# Patient Record
Sex: Male | Born: 1955 | Race: Black or African American | Hispanic: No | Marital: Single | Smoking: Former smoker
Health system: Southern US, Community
[De-identification: ages and names within clinical notes are randomized; demographics above are authoritative.]

## PROBLEM LIST (undated history)

## (undated) DIAGNOSIS — H409 Unspecified glaucoma: Secondary | ICD-10-CM

## (undated) DIAGNOSIS — I10 Essential (primary) hypertension: Secondary | ICD-10-CM

## (undated) DIAGNOSIS — E119 Type 2 diabetes mellitus without complications: Secondary | ICD-10-CM

## (undated) DIAGNOSIS — M1990 Unspecified osteoarthritis, unspecified site: Secondary | ICD-10-CM

## (undated) HISTORY — PX: HERNIA REPAIR: SHX51

---

## 2017-06-16 DIAGNOSIS — Z8739 Personal history of other diseases of the musculoskeletal system and connective tissue: Secondary | ICD-10-CM | POA: Insufficient documentation

## 2017-06-16 DIAGNOSIS — Z79899 Other long term (current) drug therapy: Secondary | ICD-10-CM | POA: Insufficient documentation

## 2017-12-31 DIAGNOSIS — M545 Low back pain, unspecified: Secondary | ICD-10-CM | POA: Insufficient documentation

## 2017-12-31 DIAGNOSIS — G8929 Other chronic pain: Secondary | ICD-10-CM | POA: Insufficient documentation

## 2018-01-24 ENCOUNTER — Other Ambulatory Visit: Payer: Self-pay

## 2018-01-24 ENCOUNTER — Emergency Department (HOSPITAL_COMMUNITY)
Admission: EM | Admit: 2018-01-24 | Discharge: 2018-01-24 | Disposition: A | Discharge status: Home / Self Care | Payer: Medicaid Other | Attending: Emergency Medicine | Admitting: Emergency Medicine

## 2018-01-24 ENCOUNTER — Encounter (HOSPITAL_COMMUNITY): Payer: Self-pay

## 2018-01-24 DIAGNOSIS — Z87891 Personal history of nicotine dependence: Secondary | ICD-10-CM | POA: Diagnosis not present

## 2018-01-24 DIAGNOSIS — I10 Essential (primary) hypertension: Secondary | ICD-10-CM | POA: Insufficient documentation

## 2018-01-24 DIAGNOSIS — B029 Zoster without complications: Secondary | ICD-10-CM | POA: Diagnosis not present

## 2018-01-24 DIAGNOSIS — R21 Rash and other nonspecific skin eruption: Secondary | ICD-10-CM | POA: Diagnosis present

## 2018-01-24 DIAGNOSIS — E119 Type 2 diabetes mellitus without complications: Secondary | ICD-10-CM | POA: Insufficient documentation

## 2018-01-24 HISTORY — DX: Essential (primary) hypertension: I10

## 2018-01-24 HISTORY — DX: Unspecified osteoarthritis, unspecified site: M19.90

## 2018-01-24 HISTORY — DX: Unspecified glaucoma: H40.9

## 2018-01-24 HISTORY — DX: Type 2 diabetes mellitus without complications: E11.9

## 2018-01-24 LAB — CBC WITH DIFFERENTIAL/PLATELET
BASOS ABS: 0 10*3/uL (ref 0.0–0.1)
Basophils Relative: 1 %
Eosinophils Absolute: 0.2 10*3/uL (ref 0.0–0.7)
Eosinophils Relative: 5 %
HEMATOCRIT: 44.3 % (ref 39.0–52.0)
HEMOGLOBIN: 15.3 g/dL (ref 13.0–17.0)
LYMPHS PCT: 28 %
Lymphs Abs: 1.2 10*3/uL (ref 0.7–4.0)
MCH: 29.4 pg (ref 26.0–34.0)
MCHC: 34.5 g/dL (ref 30.0–36.0)
MCV: 85.2 fL (ref 78.0–100.0)
Monocytes Absolute: 0.4 10*3/uL (ref 0.1–1.0)
Monocytes Relative: 8 %
NEUTROS ABS: 2.5 10*3/uL (ref 1.7–7.7)
Neutrophils Relative %: 58 %
Platelets: 180 10*3/uL (ref 150–400)
RBC: 5.2 MIL/uL (ref 4.22–5.81)
RDW: 13 % (ref 11.5–15.5)
WBC: 4.4 10*3/uL (ref 4.0–10.5)

## 2018-01-24 LAB — BASIC METABOLIC PANEL
ANION GAP: 9 (ref 5–15)
BUN: 14 mg/dL (ref 6–20)
CO2: 27 mmol/L (ref 22–32)
Calcium: 9.4 mg/dL (ref 8.9–10.3)
Chloride: 102 mmol/L (ref 101–111)
Creatinine, Ser: 0.89 mg/dL (ref 0.61–1.24)
GFR calc Af Amer: >60 mL/min (ref >60)
GLUCOSE: 261 mg/dL — AB (ref 65–99)
POTASSIUM: 3.5 mmol/L (ref 3.5–5.1)
Sodium: 138 mmol/L (ref 135–145)

## 2018-01-24 MED ORDER — VALACYCLOVIR HCL 1 G PO TABS: 1000.0000 mg | ORAL_TABLET | Freq: Three times a day (TID) | ORAL | 0 refills | Status: AC

## 2018-01-24 NOTE — Discharge Instructions (Addendum)
Please read attached information. If you experience any new or worsening signs or symptoms please return to the emergency room for evaluation. Please follow-up with your primary care provider or specialist as discussed. Please use medication prescribed only as directed and discontinue taking if you have any concerning signs or symptoms.   °

## 2018-01-24 NOTE — ED Triage Notes (Signed)
Pt reports rash came up Friday with mild itching on back and the on Sunday came around chest. Pt reports soreness since yesterday

## 2018-01-24 NOTE — ED Provider Notes (Signed)
[facility] EMERGENCY DEPARTMENT Provider Note   CSN: [id number] Arrival date & time: 01/24/18  1141   History   Chief Complaint Chief Complaint  Patient presents with  . Rash    HPI Wayne Young is a 62 y.o. male.  HPI    50 YOM presen patient notes personally four days ago he had a tingling sensation in his back, he notes a rash popping up yesterday. He denies significant pain, denies any new exposures. Patient does report a history of rheumatoid arthritis currently on methotrexate. He denies any fever chills. Patient also has diabetes with normal blood sugar reading this morning, do not use his insulin today. Ts today with  Rash to left ribs and back. He denies any other symptoms here today.    Past Medical History:  Diagnosis Date  . Arthritis   . Diabetes mellitus without complication (HCC)   . Glaucoma   . Hypertension     There are no active problems to display for this patient.   Past Surgical History:  Procedure Laterality Date  . HERNIA REPAIR          Home Medications    Prior to Admission medications   Medication Sig Start Date End Date Taking? Authorizing Provider  valACYclovir (VALTREX) 1000 MG tablet Take 1 tablet (1,000 mg total) by mouth 3 (three) times daily for 7 days. 01/24/18 01/31/18  Eyvonne Mechanic, PA-C    Family History No family history on file.  Social History Social History   Tobacco Use  . Smoking status: Former Games developer  . Smokeless tobacco: Never Used  . Tobacco comment: quit about 20 years ago   Substance Use Topics  . Alcohol use: Never    Frequency: Never  . Drug use: Never     Allergies   Patient has no known allergies.   Review of Systems Review of Systems  All other systems reviewed and are negative.    Physical Exam Updated Vital Signs BP 138/85 (BP Location: Right Arm)   Pulse 73   Temp 98.8 F (37.1 C) (Oral)   Resp 12   SpO2 100%   Physical Exam  Constitutional: He is  oriented to person, place, and time. He appears well-developed and well-nourished.  HENT:  Head: Normocephalic and atraumatic.  Eyes: Pupils are equal, round, and reactive to light. Conjunctivae are normal. Right eye exhibits no discharge. Left eye exhibits no discharge. No scleral icterus.  Neck: Normal range of motion. No JVD present. No tracheal deviation present.  Pulmonary/Chest: Effort normal. No stridor.  Neurological: He is alert and oriented to person, place, and time. Coordination normal.  Psychiatric: He has a normal mood and affect. His behavior is normal. Judgment and thought content normal.  Nursing note and vitals reviewed.          ED Treatments / Results  Labs (all labs ordered are listed, but only abnormal results are displayed) Labs Reviewed  BASIC METABOLIC PANEL - Abnormal; Notable for the following components:      Result Value   Glucose, Bld 261 (*)    All other components within normal limits  CBC WITH DIFFERENTIAL/PLATELET    EKG None  Radiology No results found.  Procedures Procedures (including critical care time)  Medications Ordered in ED Medications - No data to display   Initial Impression / Assessment and Plan / ED Course  I have reviewed the triage vital signs and the nursing notes.  Pertinent labs & imaging results that were  available during my care of the patient were reviewed by me and considered in my medical decision making (see chart for details).      Final Clinical Impressions(s) / ED Diagnoses   Final diagnoses:  Herpes zoster without complication    Labs: BMP, CBC- glucose 261  Imaging:  Consults:  Therapeutics:  Discharge Meds: Valtrex   Assessment/Plan: Pts presentation likely shingles. Symptom onset approx 4 days ago, given patients immunocompromised status on methotrexate I will start patient on valtrex. Pt denies any pain here. Patient is encouraged follow-up as an outpatient with his primary care  provider return immediately with any new or worsening signs or symptoms. He verbalized understanding and agreement to this plan. Patient's blood sugar 261, he reports he did not take his insulin this morning, he is encouraged to continue monitoring his blood sugar levels.      ED Discharge Orders        Ordered    valACYclovir (VALTREX) 1000 MG tablet  3 times daily     01/24/18 1548       HedgesTinnie Gens, PA-C 01/24/18 1739    Tilden Fossa, MD 01/24/18 2303

## 2018-01-24 NOTE — ED Provider Notes (Signed)
Patient placed in Quick Look pathway, seen and evaluated   Chief Complaint: Rash  HPI:   62 y.o. M with PMH/o RA, DM who presentes for evaluation of rash that began 4 days ago. Reports it is sore but not pruritic. No new exposures, detergents, lotions, soaps.   ROS: Rash (one)  Physical Exam:   Gen: No distress  Neuro: Awake and Alert  Skin: Warm    Focused Exam: Scattered, groups vesicular rash noted to the back, right axilla, and the anterior right chest.             Initiation of care has begun. The patient has been counseled on the process, plan, and necessity for staying for the completion/evaluation, and the remainder of the medical screening examination    Maxwell Caul, PA-C 01/24/18 1350    Melene Plan, DO 01/24/18 1521

## 2018-04-29 ENCOUNTER — Encounter (HOSPITAL_COMMUNITY): Payer: Self-pay | Admitting: *Deleted

## 2018-04-29 ENCOUNTER — Emergency Department (HOSPITAL_COMMUNITY)
Admission: EM | Admit: 2018-04-29 | Discharge: 2018-04-29 | Disposition: A | Discharge status: Home / Self Care | Payer: Medicaid Other | Attending: Emergency Medicine | Admitting: Emergency Medicine

## 2018-04-29 ENCOUNTER — Other Ambulatory Visit: Payer: Self-pay

## 2018-04-29 DIAGNOSIS — Z87891 Personal history of nicotine dependence: Secondary | ICD-10-CM | POA: Diagnosis not present

## 2018-04-29 DIAGNOSIS — E876 Hypokalemia: Secondary | ICD-10-CM

## 2018-04-29 DIAGNOSIS — I10 Essential (primary) hypertension: Secondary | ICD-10-CM | POA: Diagnosis not present

## 2018-04-29 DIAGNOSIS — E162 Hypoglycemia, unspecified: Secondary | ICD-10-CM

## 2018-04-29 DIAGNOSIS — E119 Type 2 diabetes mellitus without complications: Secondary | ICD-10-CM | POA: Diagnosis not present

## 2018-04-29 LAB — CBC WITH DIFFERENTIAL/PLATELET
ABS IMMATURE GRANULOCYTES: 0 10*3/uL (ref 0.0–0.1)
BASOS ABS: 0 10*3/uL (ref 0.0–0.1)
Basophils Relative: 1 %
Eosinophils Absolute: 0.4 10*3/uL (ref 0.0–0.7)
Eosinophils Relative: 5 %
HCT: 53.8 % — ABNORMAL HIGH (ref 39.0–52.0)
HEMOGLOBIN: 17.9 g/dL — AB (ref 13.0–17.0)
IMMATURE GRANULOCYTES: 0 %
LYMPHS PCT: 49 %
Lymphs Abs: 3.7 10*3/uL (ref 0.7–4.0)
MCH: 29 pg (ref 26.0–34.0)
MCHC: 33.3 g/dL (ref 30.0–36.0)
MCV: 87.1 fL (ref 78.0–100.0)
MONO ABS: 0.5 10*3/uL (ref 0.1–1.0)
Monocytes Relative: 7 %
NEUTROS ABS: 2.8 10*3/uL (ref 1.7–7.7)
NEUTROS PCT: 38 %
Platelets: 228 10*3/uL (ref 150–400)
RBC: 6.18 MIL/uL — AB (ref 4.22–5.81)
RDW: 13.2 % (ref 11.5–15.5)
WBC: 7.4 10*3/uL (ref 4.0–10.5)

## 2018-04-29 LAB — COMPREHENSIVE METABOLIC PANEL
ALBUMIN: 4.1 g/dL (ref 3.5–5.0)
ALT: 21 U/L (ref 0–44)
AST: 26 U/L (ref 15–41)
Alkaline Phosphatase: 63 U/L (ref 38–126)
Anion gap: 14 (ref 5–15)
BUN: 13 mg/dL (ref 8–23)
CHLORIDE: 105 mmol/L (ref 98–111)
CO2: 27 mmol/L (ref 22–32)
CREATININE: 0.93 mg/dL (ref 0.61–1.24)
Calcium: 9.8 mg/dL (ref 8.9–10.3)
GFR calc non Af Amer: >60 mL/min (ref >60)
Glucose, Bld: 52 mg/dL — ABNORMAL LOW (ref 70–99)
Potassium: 2.7 mmol/L — CL (ref 3.5–5.1)
SODIUM: 146 mmol/L — AB (ref 135–145)
Total Bilirubin: 0.8 mg/dL (ref 0.3–1.2)
Total Protein: 7.4 g/dL (ref 6.5–8.1)

## 2018-04-29 LAB — CBG MONITORING, ED
GLUCOSE-CAPILLARY: 78 mg/dL (ref 70–99)
GLUCOSE-CAPILLARY: 140 mg/dL — AB (ref 70–99)
Glucose-Capillary: 43 mg/dL — CL (ref 70–99)
Glucose-Capillary: 58 mg/dL — ABNORMAL LOW (ref 70–99)

## 2018-04-29 MED ORDER — POTASSIUM CHLORIDE CRYS ER 20 MEQ PO TBCR
40.0000 meq | EXTENDED_RELEASE_TABLET | Freq: Once | ORAL | Status: AC
  Administered 2018-04-29: 40 meq via ORAL
  Filled 2018-04-29: qty 2

## 2018-04-29 NOTE — Discharge Instructions (Addendum)
Please return for any problem.  Follow-up with your regular doctor as instructed for recheck of your potassium.  Always eat after taking insulin.

## 2018-04-29 NOTE — ED Triage Notes (Signed)
Pt in stating he gave himself his insulin this morning and developed hives at the injection site, on the way here he did not eat and developed hypoglycemia, diaphoretic on arrrival with CBG 43, given food and juice in triage

## 2018-04-29 NOTE — ED Notes (Signed)
Pt has no complaints at this time  

## 2018-04-29 NOTE — ED Provider Notes (Signed)
[facility] EMERGENCY DEPARTMENT Provider Note   CSN: [id number] Arrival date & time: 04/29/18  1004     History   Chief Complaint Chief Complaint  Patient presents with  . Hypoglycemia    HPI Wayne Young is a 62 y.o. male.  62 year old male with prior history of insulin-dependent diabetes presents for hypoglycemia.  Patient reports that he gave himself insulin this morning.  He subsequently did not eat breakfast.  Shortly thereafter he developed symptoms of hypoglycemia and came straight to the ED.  Upon arrival to the ED he was given food to eat and he feels improved.  He denies other complaint.  The history is provided by the patient and medical records.  Illness  This is a recurrent problem. The current episode started 1 to 2 hours ago. The problem occurs rarely. The problem has been gradually improving. Pertinent negatives include no chest pain, no abdominal pain, no headaches and no shortness of breath. Nothing aggravates the symptoms. Nothing relieves the symptoms. He has tried nothing for the symptoms.    Past Medical History:  Diagnosis Date  . Arthritis   . Diabetes mellitus without complication (HCC)   . Glaucoma   . Hypertension     There are no active problems to display for this patient.   Past Surgical History:  Procedure Laterality Date  . HERNIA REPAIR          Home Medications    Prior to Admission medications   Not on File    Family History History reviewed. No pertinent family history.  Social History Social History   Tobacco Use  . Smoking status: Former Games developer  . Smokeless tobacco: Never Used  . Tobacco comment: quit about 20 years ago   Substance Use Topics  . Alcohol use: Never    Frequency: Never  . Drug use: Never     Allergies   Patient has no known allergies.   Review of Systems Review of Systems  Respiratory: Negative for shortness of breath.   Cardiovascular: Negative for chest pain.    Gastrointestinal: Negative for abdominal pain.  Neurological: Negative for headaches.  All other systems reviewed and are negative.    Physical Exam Updated Vital Signs BP 117/81 (BP Location: Right Arm)   Pulse (!) 101   Temp (!) 97.5 F (36.4 C) (Oral)   Resp 17   SpO2 100%   Physical Exam  Constitutional: He is oriented to person, place, and time. He appears well-developed and well-nourished. No distress.  HENT:  Head: Normocephalic and atraumatic.  Mouth/Throat: Oropharynx is clear and moist.  Eyes: Pupils are equal, round, and reactive to light. Conjunctivae and EOM are normal.  Neck: Normal range of motion. Neck supple.  Cardiovascular: Normal rate, regular rhythm and normal heart sounds.  Pulmonary/Chest: Effort normal and breath sounds normal. No respiratory distress.  Abdominal: Soft. He exhibits no distension. There is no tenderness.  Musculoskeletal: Normal range of motion. He exhibits no edema or deformity.  Neurological: He is alert and oriented to person, place, and time.  Skin: Skin is warm and dry.  Psychiatric: He has a normal mood and affect.  Nursing note and vitals reviewed.    ED Treatments / Results  Labs (all labs ordered are listed, but only abnormal results are displayed) Labs Reviewed  CBC WITH DIFFERENTIAL/PLATELET - Abnormal; Notable for the following components:      Result Value   RBC 6.18 (*)    Hemoglobin 17.9 (*)  HCT 53.8 (*)    All other components within normal limits  COMPREHENSIVE METABOLIC PANEL - Abnormal; Notable for the following components:   Sodium 146 (*)    Potassium 2.7 (*)    Glucose, Bld 52 (*)    All other components within normal limits  CBG MONITORING, ED - Abnormal; Notable for the following components:   Glucose-Capillary 43 (*)    All other components within normal limits  CBG MONITORING, ED - Abnormal; Notable for the following components:   Glucose-Capillary 58 (*)    All other components within normal  limits  CBG MONITORING, ED - Abnormal; Notable for the following components:   Glucose-Capillary 140 (*)    All other components within normal limits  CBG MONITORING, ED    EKG None  Radiology No results found.  Procedures Procedures (including critical care time)  Medications Ordered in ED Medications  potassium chloride SA (K-DUR,KLOR-CON) CR tablet 40 mEq (has no administration in time range)     Initial Impression / Assessment and Plan / ED Course  I have reviewed the triage vital signs and the nursing notes.  Pertinent labs & imaging results that were available during my care of the patient were reviewed by me and considered in my medical decision making (see chart for details).     MDM  Screen complete  Patient is presenting for episode of hypoglycemia.  Patient appears to have given himself insulin and then not eating breakfast quickly.  Symptoms improved with p.o. intake.  After a brief period of observation in the ED he is without symptoms.  Repeat Accu-Chek shows improvement in his blood glucose.  Screening labs did reveal hypokalemia which was treated.  Patient understands the need for close follow-up with his regular doctor - especially for recheck of his potassium. Return precautions are given and understood.  Final Clinical Impressions(s) / ED Diagnoses   Final diagnoses:  Hypoglycemia  Hypokalemia    ED Discharge Orders    None       Wynetta Fines, MD 04/29/18 1217

## 2018-04-29 NOTE — ED Notes (Signed)
Pt given additional juice and snacks, remains alert and oriented able to tolerate POs

## 2018-06-28 ENCOUNTER — Emergency Department (HOSPITAL_COMMUNITY)
Admission: EM | Admit: 2018-06-28 | Discharge: 2018-06-28 | Disposition: A | Discharge status: Home / Self Care | Payer: Medicaid Other | Attending: Emergency Medicine | Admitting: Emergency Medicine

## 2018-06-28 ENCOUNTER — Other Ambulatory Visit: Payer: Self-pay

## 2018-06-28 ENCOUNTER — Encounter (HOSPITAL_COMMUNITY): Payer: Self-pay | Admitting: *Deleted

## 2018-06-28 ENCOUNTER — Emergency Department (HOSPITAL_COMMUNITY): Payer: Medicaid Other

## 2018-06-28 DIAGNOSIS — I10 Essential (primary) hypertension: Secondary | ICD-10-CM | POA: Insufficient documentation

## 2018-06-28 DIAGNOSIS — Z87891 Personal history of nicotine dependence: Secondary | ICD-10-CM | POA: Diagnosis not present

## 2018-06-28 DIAGNOSIS — E119 Type 2 diabetes mellitus without complications: Secondary | ICD-10-CM | POA: Diagnosis not present

## 2018-06-28 DIAGNOSIS — R0600 Dyspnea, unspecified: Secondary | ICD-10-CM | POA: Diagnosis present

## 2018-06-28 LAB — CBC
HEMATOCRIT: 50.7 % (ref 39.0–52.0)
HEMOGLOBIN: 17.1 g/dL — AB (ref 13.0–17.0)
MCH: 29.6 pg (ref 26.0–34.0)
MCHC: 33.7 g/dL (ref 30.0–36.0)
MCV: 87.9 fL (ref 78.0–100.0)
Platelets: 223 10*3/uL (ref 150–400)
RBC: 5.77 MIL/uL (ref 4.22–5.81)
RDW: 13.1 % (ref 11.5–15.5)
WBC: 4.7 10*3/uL (ref 4.0–10.5)

## 2018-06-28 LAB — I-STAT TROPONIN, ED: TROPONIN I, POC: 0.02 ng/mL (ref 0.00–0.08)

## 2018-06-28 LAB — BASIC METABOLIC PANEL
ANION GAP: 13 (ref 5–15)
BUN: 10 mg/dL (ref 8–23)
CALCIUM: 9.4 mg/dL (ref 8.9–10.3)
CHLORIDE: 97 mmol/L — AB (ref 98–111)
CO2: 27 mmol/L (ref 22–32)
Creatinine, Ser: 0.88 mg/dL (ref 0.61–1.24)
GFR calc non Af Amer: >60 mL/min (ref >60)
GLUCOSE: 283 mg/dL — AB (ref 70–99)
POTASSIUM: 3.1 mmol/L — AB (ref 3.5–5.1)
Sodium: 137 mmol/L (ref 135–145)

## 2018-06-28 MED ORDER — IPRATROPIUM BROMIDE 0.02 % IN SOLN
0.5000 mg | Freq: Once | RESPIRATORY_TRACT | Status: AC
  Administered 2018-06-28: 0.5 mg via RESPIRATORY_TRACT
  Filled 2018-06-28: qty 2.5

## 2018-06-28 MED ORDER — ALBUTEROL SULFATE HFA 108 (90 BASE) MCG/ACT IN AERS
2.0000 | INHALATION_SPRAY | RESPIRATORY_TRACT | Status: DC
  Administered 2018-06-28: 2 via RESPIRATORY_TRACT
  Filled 2018-06-28: qty 6.7

## 2018-06-28 MED ORDER — POTASSIUM CHLORIDE CRYS ER 20 MEQ PO TBCR
40.0000 meq | EXTENDED_RELEASE_TABLET | Freq: Once | ORAL | Status: AC
  Administered 2018-06-28: 40 meq via ORAL
  Filled 2018-06-28: qty 2

## 2018-06-28 MED ORDER — ALBUTEROL SULFATE (2.5 MG/3ML) 0.083% IN NEBU
5.0000 mg | INHALATION_SOLUTION | Freq: Once | RESPIRATORY_TRACT | Status: AC
  Administered 2018-06-28: 5 mg via RESPIRATORY_TRACT
  Filled 2018-06-28: qty 6

## 2018-06-28 NOTE — ED Provider Notes (Signed)
Patient placed in Quick Look pathway, seen and evaluated   Chief Complaint: Shortness of breath  HPI:   Patient with 20-month history of progressively worsening dyspnea on exertion.  Denies chest pain, cough, fevers.  Former smoker.  Denies orthopnea, leg swelling, or PND.  No recent travel or surgeries, no hemoptysis, no prior history of DVT or PE.  Does not take testosterone hormone placement therapy.  ROS: Positive for shortness of breath Negative for leg swelling, fevers, chest pain, cough, orthopnea, PND.  Physical Exam:   Gen: No distress  Neuro: Awake and Alert  Skin: Warm    Focused Exam: Heart regular rhythm, no murmurs rubs or gallops.  Tachycardic.  Globally diminished breath sounds.  Equal rise and fall of chest, no increased work of breathing.  Speaking in full sentences.  No lower cavity edema, Homans sign absent bilaterally.  2+ DP/PT pulses bilaterally.   Initiation of care has begun. The patient has been counseled on the process, plan, and necessity for staying for the completion/evaluation, and the remainder of the medical screening examination    Jeanie Sewer, PA-C 06/28/18 1600    Lorre Nick, MD 06/29/18 [phone]

## 2018-06-28 NOTE — ED Provider Notes (Signed)
[facility] EMERGENCY DEPARTMENT Provider Note   CSN: [id number] Arrival date & time: 06/28/18  1531     History   Chief Complaint Chief Complaint  Patient presents with  . Shortness of Breath    HPI Anakin Varkey is a 62 y.o. male.  62 year old male presents with several month history of dyspnea on exertion.  He denies any orthopnea.  Denies any chest pain, cough, fever or chills.  No history of blood loss.  Has been referred for further specialized testing but is not had that yet.  Does have a history of tobacco abuse.  Denies any lower extremity edema.  Symptoms are better with rest.     Past Medical History:  Diagnosis Date  . Arthritis   . Diabetes mellitus without complication (HCC)   . Glaucoma   . Hypertension     There are no active problems to display for this patient.   Past Surgical History:  Procedure Laterality Date  . HERNIA REPAIR          Home Medications    Prior to Admission medications   Not on File    Family History History reviewed. No pertinent family history.  Social History Social History   Tobacco Use  . Smoking status: Former Games developer  . Smokeless tobacco: Never Used  . Tobacco comment: quit about 20 years ago   Substance Use Topics  . Alcohol use: Never    Frequency: Never  . Drug use: Never     Allergies   Patient has no known allergies.   Review of Systems Review of Systems  All other systems reviewed and are negative.    Physical Exam Updated Vital Signs BP (!) 140/99   Pulse 97   Temp 98.4 F (36.9 C) (Oral)   Resp 18   SpO2 99%   Physical Exam  Constitutional: He is oriented to person, place, and time. He appears well-developed and well-nourished.  Non-toxic appearance. No distress.  HENT:  Head: Normocephalic and atraumatic.  Eyes: Pupils are equal, round, and reactive to light. Conjunctivae, EOM and lids are normal.  Neck: Normal range of motion. Neck supple. No tracheal  deviation present. No thyroid mass present.  Cardiovascular: Normal rate, regular rhythm and normal heart sounds. Exam reveals no gallop.  No murmur heard. Pulmonary/Chest: Effort normal. No stridor. No respiratory distress. He has decreased breath sounds in the right lower field and the left lower field. He has no wheezes. He has no rhonchi. He has no rales.  Abdominal: Soft. Normal appearance and bowel sounds are normal. He exhibits no distension. There is no tenderness. There is no rebound and no CVA tenderness.  Musculoskeletal: Normal range of motion. He exhibits no edema or tenderness.  Neurological: He is alert and oriented to person, place, and time. He has normal strength. No cranial nerve deficit or sensory deficit. GCS eye subscore is 4. GCS verbal subscore is 5. GCS motor subscore is 6.  Skin: Skin is warm and dry. No abrasion and no rash noted.  Psychiatric: He has a normal mood and affect. His speech is normal and behavior is normal.  Nursing note and vitals reviewed.    ED Treatments / Results  Labs (all labs ordered are listed, but only abnormal results are displayed) Labs Reviewed  BASIC METABOLIC PANEL - Abnormal; Notable for the following components:      Result Value   Potassium 3.1 (*)    Chloride 97 (*)    Glucose, Bld  283 (*)    All other components within normal limits  CBC - Abnormal; Notable for the following components:   Hemoglobin 17.1 (*)    All other components within normal limits  I-STAT TROPONIN, ED    EKG EKG Interpretation  Date/Time:  Tuesday June 28 2018 15:40:07 EDT Ventricular Rate:  113 PR Interval:  172 QRS Duration: 86 QT Interval:  332 QTC Calculation: 455 R Axis:   42 Text Interpretation:  Sinus tachycardia Biatrial enlargement Anterior infarct , age undetermined Abnormal ECG Confirmed by Lorre Nick ([id number]) on 06/28/2018 9:02:08 PM   Radiology Dg Chest 2 View  Result Date: 06/28/2018 CLINICAL DATA:  Shortness of breath  and decreased energy for the past month. 15-20 pound weight loss over the past couple of months. Former smoker. History of diabetes and hypertension. EXAM: CHEST - 2 VIEW COMPARISON:  None in PACs FINDINGS: The lungs are mildly hyperinflated. There is no focal infiltrate. The interstitial markings are mildly prominent on the lateral view but appear normal on the frontal view. The heart and pulmonary vascularity are normal. The mediastinum is normal in width. The bony thorax exhibits no acute abnormality. IMPRESSION: COPD-smoking related changes. No evidence of a pulmonary mass, active infection, nor other acute cardiopulmonary abnormality. Electronically Signed   By: David  [place] M.D.   On: 06/28/2018 16:18    Procedures Procedures (including critical care time)  Medications Ordered in ED Medications  potassium chloride SA (K-DUR,KLOR-CON) CR tablet 40 mEq (has no administration in time range)  ipratropium (ATROVENT) nebulizer solution 0.5 mg (has no administration in time range)  albuterol (PROVENTIL) (2.5 MG/3ML) 0.083% nebulizer solution 5 mg (has no administration in time range)     Initial Impression / Assessment and Plan / ED Course  I have reviewed the triage vital signs and the nursing notes.  Pertinent labs & imaging results that were available during my care of the patient were reviewed by me and considered in my medical decision making (see chart for details).    pts potassium replinished patient has chronic hyperkalemia and did not take his oral potassium today. Given albuterol here and feels better.  Suspect that his dyspnea which is been for over 9 months is likely from COPD.  Will give inhaler and he will follow-up with his doctor on Thursday  Final Clinical Impressions(s) / ED Diagnoses   Final diagnoses:  None    ED Discharge Orders    None       Lorre Nick, MD 06/28/18 2221

## 2018-06-28 NOTE — ED Triage Notes (Signed)
Pt reports having ongoing sob. Has been referred to see a specialist but unable to get appt yet. spo2 98% at triage. Denies recent cough.

## 2018-07-04 ENCOUNTER — Other Ambulatory Visit: Payer: Self-pay | Admitting: Gastroenterology

## 2018-07-04 DIAGNOSIS — R634 Abnormal weight loss: Secondary | ICD-10-CM

## 2018-07-04 DIAGNOSIS — R0602 Shortness of breath: Secondary | ICD-10-CM

## 2018-07-08 ENCOUNTER — Ambulatory Visit
Admission: RE | Admit: 2018-07-08 | Discharge: 2018-07-08 | Disposition: A | Discharge status: Home / Self Care | Payer: Medicaid Other | Source: Ambulatory Visit | Attending: Gastroenterology | Admitting: Gastroenterology

## 2018-07-08 ENCOUNTER — Other Ambulatory Visit: Payer: Medicaid Other

## 2018-07-08 DIAGNOSIS — R634 Abnormal weight loss: Secondary | ICD-10-CM

## 2018-07-08 DIAGNOSIS — R0602 Shortness of breath: Secondary | ICD-10-CM

## 2018-07-08 MED ORDER — IOPAMIDOL (ISOVUE-300) INJECTION 61%
75.0000 mL | Freq: Once | INTRAVENOUS | Status: AC | PRN
  Administered 2018-07-08: 75 mL via INTRAVENOUS

## 2018-07-13 ENCOUNTER — Other Ambulatory Visit: Payer: Self-pay | Admitting: Gastroenterology

## 2018-07-13 ENCOUNTER — Other Ambulatory Visit: Payer: Medicaid Other

## 2018-07-13 ENCOUNTER — Encounter: Payer: Self-pay | Admitting: Pulmonary Disease

## 2018-07-13 ENCOUNTER — Ambulatory Visit (INDEPENDENT_AMBULATORY_CARE_PROVIDER_SITE_OTHER): Payer: Medicaid Other | Admitting: Pulmonary Disease

## 2018-07-13 VITALS — BP 128/80 | HR 95 | Ht 67.0 in | Wt 137.2 lb

## 2018-07-13 DIAGNOSIS — R911 Solitary pulmonary nodule: Secondary | ICD-10-CM | POA: Diagnosis not present

## 2018-07-13 DIAGNOSIS — R634 Abnormal weight loss: Secondary | ICD-10-CM | POA: Diagnosis not present

## 2018-07-13 DIAGNOSIS — M059 Rheumatoid arthritis with rheumatoid factor, unspecified: Secondary | ICD-10-CM | POA: Diagnosis not present

## 2018-07-13 DIAGNOSIS — R7612 Nonspecific reaction to cell mediated immunity measurement of gamma interferon antigen response without active tuberculosis: Secondary | ICD-10-CM

## 2018-07-13 DIAGNOSIS — Z9189 Other specified personal risk factors, not elsewhere classified: Secondary | ICD-10-CM

## 2018-07-13 DIAGNOSIS — R768 Other specified abnormal immunological findings in serum: Secondary | ICD-10-CM | POA: Insufficient documentation

## 2018-07-13 DIAGNOSIS — R0602 Shortness of breath: Secondary | ICD-10-CM | POA: Diagnosis not present

## 2018-07-13 NOTE — Progress Notes (Signed)
Synopsis: Referred in September 2019 for shortness of breath and weight loss by Valerie Roys,*  Subjective:   PATIENT ID: Wayne Young GENDER: male DOB: 12-Dec-1955, MRN: [id number]  Chief Complaint  Patient presents with  . Consult    SOB x 3 months with minimal effort, denies chest pain or discomfort, has lost 30lbs over last 3 months.     Patient has had SOB for the past 3-4 months. Its been worse over the past several weeks. He was diagnosed in early 2000s with DMII and is now on insulin. He was diagnosed with RA in 2008, he sees a rheuatologist at [facility], currently managed with methotrexate plus folic acid. He has lost 30lbs over the past several months.  Weight loss is seemingly in the most distressing symptom.  He also complains of poor appetite and the feeling of making himself eat.  C-scope in 2015 that was negative. He has a CT abd pending for his evaluation of weight loss. He has never had an EGD.   Former smoker, quit 21 years ago, smoked for many years, ~25 pack year history.  He used to drink alcohol but he quit the same time he quit smoking.  He denies illicit drug use. Most recent CT of the chest reveals evidence of centrilobular emphysema.  However upon review this does appear mild.  He denies any reflux symptoms, dysphagia, odynophagia, black stools, abdominal pain.  When asked about hepatitis the C, TB and HIV risk factors.  Patient was incarcerated for 17 years and was released from jail in January 2018.  His wife has HIV and is currently on therapy.  He was screened for HIV upon release from prison which she states was negative.  He does not ever remember being screened for syphilis.  He states all sexual intercourse with his wife includes condom protection.  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  He was given 6 months of medication to take for the positive PPD.   Past Medical History:  Diagnosis Date  . Arthritis   . Diabetes  mellitus without complication (HCC)   . Glaucoma   . Hypertension      Family History  Problem Relation Age of Onset  . Asthma Mother      Past Surgical History:  Procedure Laterality Date  . HERNIA REPAIR      Social History   Socioeconomic History  . Marital status: Single    Spouse name: Not on file  . Number of children: Not on file  . Years of education: Not on file  . Highest education level: Not on file  Occupational History  . Not on file  Social Needs  . Financial resource strain: Not on file  . Food insecurity:    Worry: Not on file    Inability: Not on file  . Transportation needs:    Medical: Not on file    Non-medical: Not on file  Tobacco Use  . Smoking status: Former Games developer  . Smokeless tobacco: Never Used  . Tobacco comment: quit about 20 years ago   Substance and Sexual Activity  . Alcohol use: Never    Frequency: Never  . Drug use: Never  . Sexual activity: Never  Lifestyle  . Physical activity:    Days per week: Not on file    Minutes per session: Not on file  . Stress: Not on file  Relationships  . Social connections:    Talks on phone: Not  on file    Gets together: Not on file    Attends religious service: Not on file    Active member of club or organization: Not on file    Attends meetings of clubs or organizations: Not on file    Relationship status: Not on file  . Intimate partner violence:    Fear of current or ex partner: Not on file    Emotionally abused: Not on file    Physically abused: Not on file    Forced sexual activity: Not on file  Other Topics Concern  . Not on file  Social History Narrative  . Not on file     No Known Allergies   Outpatient Medications Prior to Visit  Medication Sig Dispense Refill  . amLODipine (NORVASC) 10 MG tablet Take by mouth.    Marland Kitchen atorvastatin (LIPITOR) 20 MG tablet Take 20 mg by mouth at bedtime.  3  . folic acid (FOLVITE) 1 MG tablet Take by mouth.    . hydrochlorothiazide  (HYDRODIURIL) 25 MG tablet Take by mouth.    . insulin NPH Human (HUMULIN N,NOVOLIN N) 100 UNIT/ML injection Inject into the skin.    . metFORMIN (GLUCOPHAGE) 850 MG tablet Take by mouth.    . methotrexate (RHEUMATREX) 2.5 MG tablet Take by mouth.    . Multiple Vitamin (MULTIVITAMIN) tablet Take 1 tablet by mouth daily.    . pantoprazole (PROTONIX) 40 MG tablet TAKE 1 TABLET BY MOUTH IN THE MORNING FOR 30 DAYS  2   No facility-administered medications prior to visit.     Review of Systems  Constitutional: Positive for weight loss (30lbs unintentional ). Negative for chills, fever and malaise/fatigue.  HENT: Negative for hearing loss, sore throat and tinnitus.        Hoarseness of voice   Eyes: Negative for blurred vision and double vision.  Respiratory: Positive for shortness of breath. Negative for cough, hemoptysis, sputum production, wheezing and stridor.        DOE   Cardiovascular: Negative for chest pain, palpitations, orthopnea, leg swelling and PND.  Gastrointestinal: Negative for abdominal pain, constipation, diarrhea, heartburn, nausea and vomiting.  Genitourinary: Negative for dysuria, hematuria and urgency.  Musculoskeletal: Negative for joint pain and myalgias.  Skin: Positive for rash. Negative for itching.  Neurological: Negative for dizziness, tingling, weakness and headaches.  Endo/Heme/Allergies: Negative for environmental allergies. Does not bruise/bleed easily.  Psychiatric/Behavioral: Negative for depression. The patient is not nervous/anxious and does not have insomnia.   All other systems reviewed and are negative.    Objective:  Physical Exam  Constitutional: He is oriented to person, place, and time. He appears well-developed and well-nourished. No distress.  HENT:  Head: Normocephalic and atraumatic.  Mouth/Throat: Oropharynx is clear and moist.  Eyes: Pupils are equal, round, and reactive to light. Conjunctivae are normal. No scleral icterus.  Neck:  Neck supple. No JVD present. No tracheal deviation present.  Cardiovascular: Normal rate, regular rhythm, normal heart sounds and intact distal pulses.  No murmur heard. Pulmonary/Chest: Effort normal and breath sounds normal. No accessory muscle usage or stridor. No tachypnea. No respiratory distress. He has no wheezes. He has no rhonchi. He has no rales.  Abdominal: Soft. Bowel sounds are normal. He exhibits no distension. There is no tenderness.  Flat, thin   Musculoskeletal: He exhibits no edema or tenderness.  Thin, muscle loss  Lymphadenopathy:    He has no cervical adenopathy.  Neurological: He is alert and oriented to person, place, and  time.  Skin: Skin is warm and dry. Capillary refill takes less than 2 seconds. Rash noted.  Dark pigmented patches on posterior back  Psychiatric: He has a normal mood and affect. His behavior is normal.  Vitals reviewed.    Vitals:   07/13/18 1555  BP: 128/80  Pulse: 95  SpO2: 94%  Weight: 137 lb 3.2 oz (62.2 kg)  Height: 5\' 7"  (1.702 m)   94% on RA BMI Readings from Last 3 Encounters:  07/13/18 21.49 kg/m   Wt Readings from Last 3 Encounters:  07/13/18 137 lb 3.2 oz (62.2 kg)     CBC    Component Value Date/Time   WBC 4.7 06/28/2018 1603   RBC 5.77 06/28/2018 1603   HGB 17.1 (H) 06/28/2018 1603   [id number] Marland KitchenO QuantiFERON-TB Gold test  Positive anti-CCP test  At risk for sexually transmitted disease due to partner with HIV  Discussion:  Patient has multiple potential etiologies of his shortness of breath.  He does have a long-standing history of smoking however quit several years ago.  CT scan with evidence of mild to moderate centrilobular emphysema.  His weight loss is most concerning to me.  He does have risk factors for HIV, hep C, hep B, syphilis.  He has no respiratory complaints besides shortness of breath, no sputum production, no hemoptysis however did have a prior positive PPD. History of incarceration. 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  Patient is unsure of the drug regimen.  Concern for underlying malignancy with history of RA on immune suppression as well as potential for chronic infection or also concerning.  Additionally, the DOE/SOB could be related to small airway disease or ILD caused by RA. However there was no significant evidence on CT imaging of this.   Plan: Will send you for lab work today: To include screening for HIV, hep C, hep B, syphilis.  As well as repeat QuantiFERON gold. We will need to have his rheumatologist address ongoing immunosuppression with positive quantiferon / hx of latent TB. We need to make sure you have Abd CT which has been ordered.  We will get PFTs and an ECHO.  He will need a 9-12 follow up CT chest wo contrast for follow up regarding the lung nodules RTC in 3 weeks, once imaging and labs have been completed.    Current Outpatient Medications:  .  amLODipine (NORVASC) 10 MG tablet,  Take by mouth., Disp: , Rfl:  .  atorvastatin (LIPITOR) 20 MG tablet, Take 20  mg by mouth at bedtime., Disp: , Rfl: 3 .  folic acid (FOLVITE) 1 MG tablet, Take by mouth., Disp: , Rfl:  .  hydrochlorothiazide (HYDRODIURIL) 25 MG tablet, Take by mouth., Disp: , Rfl:  .  insulin NPH Human (HUMULIN N,NOVOLIN N) 100 UNIT/ML injection, Inject into the skin., Disp: , Rfl:  .  metFORMIN (GLUCOPHAGE) 850 MG tablet, Take by mouth., Disp: , Rfl:  .  methotrexate (RHEUMATREX) 2.5 MG tablet, Take by mouth., Disp: , Rfl:  .  Multiple Vitamin (MULTIVITAMIN) tablet, Take 1 tablet by mouth daily., Disp: , Rfl:  .  pantoprazole (PROTONIX) 40 MG tablet, TAKE 1 TABLET BY MOUTH IN THE MORNING FOR 30 DAYS, Disp: , Rfl: 2   Josephine Igo, DO [facility] 07/13/2018 6:39 PM

## 2018-07-13 NOTE — Patient Instructions (Signed)
Will send you for lab work today. We need to make sure you have Abd CT which has been ordered We will get PFTs and an ECHO.  RTC in 3 weeks

## 2018-07-14 ENCOUNTER — Ambulatory Visit (HOSPITAL_COMMUNITY): Payer: Medicaid Other | Attending: Cardiovascular Disease

## 2018-07-14 ENCOUNTER — Other Ambulatory Visit: Payer: Self-pay

## 2018-07-14 DIAGNOSIS — I509 Heart failure, unspecified: Secondary | ICD-10-CM | POA: Diagnosis not present

## 2018-07-14 DIAGNOSIS — E119 Type 2 diabetes mellitus without complications: Secondary | ICD-10-CM | POA: Diagnosis not present

## 2018-07-14 DIAGNOSIS — I110 Hypertensive heart disease with heart failure: Secondary | ICD-10-CM | POA: Insufficient documentation

## 2018-07-14 DIAGNOSIS — R0602 Shortness of breath: Secondary | ICD-10-CM | POA: Diagnosis present

## 2018-07-16 LAB — HEPATITIS C ANTIBODY
Hepatitis C Ab: NONREACTIVE
SIGNAL TO CUT-OFF: 0.03 (ref <1.00)

## 2018-07-16 LAB — QUANTIFERON-TB GOLD PLUS
Mitogen-NIL: >10 [IU]/mL
NIL: 0.06 [IU]/mL
QuantiFERON-TB Gold Plus: NEGATIVE
TB1-NIL: 0.06 [IU]/mL
TB2-NIL: 0.04 [IU]/mL

## 2018-07-16 LAB — HEPATITIS B SURFACE ANTIBODY,QUALITATIVE: Hep B S Ab: NONREACTIVE

## 2018-07-16 LAB — HIV ANTIBODY (ROUTINE TESTING W REFLEX): HIV: NONREACTIVE

## 2018-07-16 LAB — HEPATITIS B SURFACE ANTIGEN: Hepatitis B Surface Ag: NONREACTIVE

## 2018-07-16 LAB — RPR: RPR: NONREACTIVE

## 2018-07-25 ENCOUNTER — Emergency Department (HOSPITAL_COMMUNITY): Payer: Medicaid Other

## 2018-07-25 ENCOUNTER — Inpatient Hospital Stay (HOSPITAL_COMMUNITY)
Admission: EM | Admit: 2018-07-25 | Discharge: 2018-08-20 | DRG: 004 | Disposition: A | Discharge status: Other Acute Inpatient Hospital | Payer: Medicaid Other | Attending: Pulmonary Disease | Admitting: Pulmonary Disease

## 2018-07-25 ENCOUNTER — Encounter (HOSPITAL_COMMUNITY): Payer: Self-pay

## 2018-07-25 ENCOUNTER — Other Ambulatory Visit: Payer: Self-pay

## 2018-07-25 DIAGNOSIS — I959 Hypotension, unspecified: Secondary | ICD-10-CM | POA: Diagnosis not present

## 2018-07-25 DIAGNOSIS — H532 Diplopia: Secondary | ICD-10-CM | POA: Diagnosis not present

## 2018-07-25 DIAGNOSIS — J690 Pneumonitis due to inhalation of food and vomit: Secondary | ICD-10-CM | POA: Diagnosis present

## 2018-07-25 DIAGNOSIS — J9690 Respiratory failure, unspecified, unspecified whether with hypoxia or hypercapnia: Secondary | ICD-10-CM | POA: Diagnosis not present

## 2018-07-25 DIAGNOSIS — R634 Abnormal weight loss: Secondary | ICD-10-CM | POA: Diagnosis not present

## 2018-07-25 DIAGNOSIS — Z681 Body mass index (BMI) 19 or less, adult: Secondary | ICD-10-CM | POA: Diagnosis not present

## 2018-07-25 DIAGNOSIS — E43 Unspecified severe protein-calorie malnutrition: Secondary | ICD-10-CM | POA: Diagnosis not present

## 2018-07-25 DIAGNOSIS — J9600 Acute respiratory failure, unspecified whether with hypoxia or hypercapnia: Secondary | ICD-10-CM

## 2018-07-25 DIAGNOSIS — Z978 Presence of other specified devices: Secondary | ICD-10-CM

## 2018-07-25 DIAGNOSIS — J189 Pneumonia, unspecified organism: Secondary | ICD-10-CM

## 2018-07-25 DIAGNOSIS — Z87891 Personal history of nicotine dependence: Secondary | ICD-10-CM

## 2018-07-25 DIAGNOSIS — M069 Rheumatoid arthritis, unspecified: Secondary | ICD-10-CM | POA: Diagnosis present

## 2018-07-25 DIAGNOSIS — L8990 Pressure ulcer of unspecified site, unspecified stage: Secondary | ICD-10-CM

## 2018-07-25 DIAGNOSIS — J986 Disorders of diaphragm: Secondary | ICD-10-CM | POA: Diagnosis present

## 2018-07-25 DIAGNOSIS — Z79899 Other long term (current) drug therapy: Secondary | ICD-10-CM | POA: Diagnosis not present

## 2018-07-25 DIAGNOSIS — J439 Emphysema, unspecified: Secondary | ICD-10-CM | POA: Diagnosis not present

## 2018-07-25 DIAGNOSIS — H409 Unspecified glaucoma: Secondary | ICD-10-CM | POA: Diagnosis not present

## 2018-07-25 DIAGNOSIS — Z0189 Encounter for other specified special examinations: Secondary | ICD-10-CM

## 2018-07-25 DIAGNOSIS — R569 Unspecified convulsions: Secondary | ICD-10-CM | POA: Diagnosis not present

## 2018-07-25 DIAGNOSIS — G9349 Other encephalopathy: Secondary | ICD-10-CM | POA: Diagnosis present

## 2018-07-25 DIAGNOSIS — T17908A Unspecified foreign body in respiratory tract, part unspecified causing other injury, initial encounter: Secondary | ICD-10-CM

## 2018-07-25 DIAGNOSIS — J90 Pleural effusion, not elsewhere classified: Secondary | ICD-10-CM

## 2018-07-25 DIAGNOSIS — J181 Lobar pneumonia, unspecified organism: Secondary | ICD-10-CM

## 2018-07-25 DIAGNOSIS — T17890A Other foreign object in other parts of respiratory tract causing asphyxiation, initial encounter: Secondary | ICD-10-CM | POA: Diagnosis present

## 2018-07-25 DIAGNOSIS — N35912 Unspecified bulbous urethral stricture, male: Secondary | ICD-10-CM | POA: Diagnosis present

## 2018-07-25 DIAGNOSIS — R34 Anuria and oliguria: Secondary | ICD-10-CM | POA: Diagnosis present

## 2018-07-25 DIAGNOSIS — E1165 Type 2 diabetes mellitus with hyperglycemia: Secondary | ICD-10-CM | POA: Diagnosis present

## 2018-07-25 DIAGNOSIS — I10 Essential (primary) hypertension: Secondary | ICD-10-CM | POA: Diagnosis not present

## 2018-07-25 DIAGNOSIS — Z01818 Encounter for other preprocedural examination: Secondary | ICD-10-CM

## 2018-07-25 DIAGNOSIS — E876 Hypokalemia: Secondary | ICD-10-CM | POA: Diagnosis not present

## 2018-07-25 DIAGNOSIS — Z930 Tracheostomy status: Secondary | ICD-10-CM

## 2018-07-25 DIAGNOSIS — E871 Hypo-osmolality and hyponatremia: Secondary | ICD-10-CM | POA: Diagnosis not present

## 2018-07-25 DIAGNOSIS — J9602 Acute respiratory failure with hypercapnia: Secondary | ICD-10-CM | POA: Diagnosis not present

## 2018-07-25 DIAGNOSIS — Z91199 Patient's noncompliance with other medical treatment and regimen due to unspecified reason: Secondary | ICD-10-CM

## 2018-07-25 DIAGNOSIS — Z9119 Patient's noncompliance with other medical treatment and regimen: Secondary | ICD-10-CM | POA: Diagnosis not present

## 2018-07-25 DIAGNOSIS — R490 Dysphonia: Secondary | ICD-10-CM

## 2018-07-25 DIAGNOSIS — Z9911 Dependence on respirator [ventilator] status: Secondary | ICD-10-CM

## 2018-07-25 DIAGNOSIS — R0603 Acute respiratory distress: Secondary | ICD-10-CM | POA: Diagnosis not present

## 2018-07-25 DIAGNOSIS — R627 Adult failure to thrive: Secondary | ICD-10-CM

## 2018-07-25 DIAGNOSIS — R0600 Dyspnea, unspecified: Secondary | ICD-10-CM | POA: Diagnosis not present

## 2018-07-25 DIAGNOSIS — Z794 Long term (current) use of insulin: Secondary | ICD-10-CM | POA: Diagnosis not present

## 2018-07-25 DIAGNOSIS — J9601 Acute respiratory failure with hypoxia: Secondary | ICD-10-CM | POA: Diagnosis not present

## 2018-07-25 DIAGNOSIS — R0689 Other abnormalities of breathing: Secondary | ICD-10-CM

## 2018-07-25 DIAGNOSIS — J9811 Atelectasis: Secondary | ICD-10-CM | POA: Diagnosis not present

## 2018-07-25 DIAGNOSIS — R4182 Altered mental status, unspecified: Secondary | ICD-10-CM | POA: Diagnosis not present

## 2018-07-25 LAB — CBG MONITORING, ED
GLUCOSE-CAPILLARY: 144 mg/dL — AB (ref 70–99)
Glucose-Capillary: 175 mg/dL — ABNORMAL HIGH (ref 70–99)

## 2018-07-25 LAB — I-STAT VENOUS BLOOD GAS, ED
ACID-BASE EXCESS: 8 mmol/L — AB (ref 0.0–2.0)
Bicarbonate: 39.7 mmol/L — ABNORMAL HIGH (ref 20.0–28.0)
O2 SAT: 72 %
PCO2 VEN: 84.6 mmHg — AB (ref 44.0–60.0)
PO2 VEN: 45 mmHg (ref 32.0–45.0)
TCO2: 42 mmol/L — ABNORMAL HIGH (ref 22–32)
pH, Ven: 7.28 (ref 7.250–7.430)

## 2018-07-25 LAB — BASIC METABOLIC PANEL
ANION GAP: 12 (ref 5–15)
BUN: 8 mg/dL (ref 8–23)
CALCIUM: 9.5 mg/dL (ref 8.9–10.3)
CO2: 30 mmol/L (ref 22–32)
CREATININE: 0.77 mg/dL (ref 0.61–1.24)
Chloride: 95 mmol/L — ABNORMAL LOW (ref 98–111)
GFR calc Af Amer: >60 mL/min (ref >60)
GLUCOSE: 184 mg/dL — AB (ref 70–99)
Potassium: 3.6 mmol/L (ref 3.5–5.1)
Sodium: 137 mmol/L (ref 135–145)

## 2018-07-25 LAB — I-STAT ARTERIAL BLOOD GAS, ED
ACID-BASE EXCESS: 10 mmol/L — AB (ref 0.0–2.0)
Bicarbonate: 36.6 mmol/L — ABNORMAL HIGH (ref 20.0–28.0)
O2 Saturation: 100 %
PCO2 ART: 52.2 mmHg — AB (ref 32.0–48.0)
PH ART: 7.453 — AB (ref 7.350–7.450)
PO2 ART: 499 mmHg — AB (ref 83.0–108.0)
Patient temperature: 98.5
TCO2: 38 mmol/L — AB (ref 22–32)

## 2018-07-25 LAB — HEPATIC FUNCTION PANEL
ALBUMIN: 3.9 g/dL (ref 3.5–5.0)
ALK PHOS: 46 U/L (ref 38–126)
ALT: 19 U/L (ref 0–44)
AST: 21 U/L (ref 15–41)
BILIRUBIN DIRECT: 0.3 mg/dL — AB (ref 0.0–0.2)
BILIRUBIN TOTAL: 1.2 mg/dL (ref 0.3–1.2)
Indirect Bilirubin: 0.9 mg/dL (ref 0.3–0.9)
Total Protein: 7.3 g/dL (ref 6.5–8.1)

## 2018-07-25 LAB — GLUCOSE, CAPILLARY
GLUCOSE-CAPILLARY: 161 mg/dL — AB (ref 70–99)
GLUCOSE-CAPILLARY: 155 mg/dL — AB (ref 70–99)
Glucose-Capillary: 129 mg/dL — ABNORMAL HIGH (ref 70–99)

## 2018-07-25 LAB — CBC
HCT: 52.4 % — ABNORMAL HIGH (ref 39.0–52.0)
HEMOGLOBIN: 17.6 g/dL — AB (ref 13.0–17.0)
MCH: 30.1 pg (ref 26.0–34.0)
MCHC: 33.6 g/dL (ref 30.0–36.0)
MCV: 89.6 fL (ref 78.0–100.0)
Platelets: 216 10*3/uL (ref 150–400)
RBC: 5.85 MIL/uL — ABNORMAL HIGH (ref 4.22–5.81)
RDW: 12.2 % (ref 11.5–15.5)
WBC: 4.4 10*3/uL (ref 4.0–10.5)

## 2018-07-25 LAB — I-STAT TROPONIN, ED: TROPONIN I, POC: 0 ng/mL (ref 0.00–0.08)

## 2018-07-25 LAB — MRSA PCR SCREENING: MRSA BY PCR: NEGATIVE

## 2018-07-25 LAB — BRAIN NATRIURETIC PEPTIDE: B Natriuretic Peptide: 8.9 pg/mL (ref 0.0–100.0)

## 2018-07-25 LAB — PSA: PROSTATIC SPECIFIC ANTIGEN: 1.68 ng/mL (ref 0.00–4.00)

## 2018-07-25 LAB — HEMOGLOBIN A1C
HEMOGLOBIN A1C: 8.1 % — AB (ref 4.8–5.6)
Mean Plasma Glucose: 185.77 mg/dL

## 2018-07-25 LAB — LIPASE, BLOOD: LIPASE: 54 U/L — AB (ref 11–51)

## 2018-07-25 LAB — D-DIMER, QUANTITATIVE (NOT AT ARMC)

## 2018-07-25 MED ORDER — ROCURONIUM BROMIDE 50 MG/5ML IV SOLN
INTRAVENOUS | Status: AC | PRN
  Administered 2018-07-25: 100 mg via INTRAVENOUS

## 2018-07-25 MED ORDER — LIDOCAINE HCL URETHRAL/MUCOSAL 2 % EX GEL
1.0000 "application " | Freq: Once | CUTANEOUS | Status: AC
  Administered 2018-07-26: 1 via URETHRAL
  Filled 2018-07-25: qty 20

## 2018-07-25 MED ORDER — FENTANYL 2500MCG IN NS 250ML (10MCG/ML) PREMIX INFUSION
0.0000 ug/h | INTRAVENOUS | Status: DC
  Administered 2018-07-25: 150 ug/h via INTRAVENOUS
  Administered 2018-07-26: 250 ug/h via INTRAVENOUS
  Administered 2018-07-27: 25 ug/h via INTRAVENOUS
  Administered 2018-07-29: 75 ug/h via INTRAVENOUS
  Filled 2018-07-25 (×6): qty 250

## 2018-07-25 MED ORDER — IOPAMIDOL (ISOVUE-300) INJECTION 61%
100.0000 mL | Freq: Once | INTRAVENOUS | Status: AC | PRN
  Administered 2018-07-25: 100 mL via INTRAVENOUS

## 2018-07-25 MED ORDER — CHLORHEXIDINE GLUCONATE 0.12% ORAL RINSE (MEDLINE KIT)
15.0000 mL | Freq: Two times a day (BID) | OROMUCOSAL | Status: DC
  Administered 2018-07-25 – 2018-07-27 (×4): 15 mL via OROMUCOSAL

## 2018-07-25 MED ORDER — FENTANYL CITRATE (PF) 100 MCG/2ML IJ SOLN
100.0000 ug | INTRAMUSCULAR | Status: DC | PRN
  Administered 2018-07-25 – 2018-07-28 (×4): 100 ug via INTRAVENOUS
  Filled 2018-07-25: qty 2

## 2018-07-25 MED ORDER — ENOXAPARIN SODIUM 40 MG/0.4ML ~~LOC~~ SOLN
40.0000 mg | SUBCUTANEOUS | Status: DC
  Administered 2018-07-25 – 2018-08-10 (×17): 40 mg via SUBCUTANEOUS
  Filled 2018-07-25 (×19): qty 0.4

## 2018-07-25 MED ORDER — IOPAMIDOL (ISOVUE-300) INJECTION 61%
30.0000 mL | Freq: Once | INTRAVENOUS | Status: AC | PRN
  Administered 2018-07-25: 30 mL via INTRAVENOUS

## 2018-07-25 MED ORDER — ORAL CARE MOUTH RINSE
15.0000 mL | OROMUCOSAL | Status: DC
  Administered 2018-07-25 – 2018-07-27 (×20): 15 mL via OROMUCOSAL

## 2018-07-25 MED ORDER — IPRATROPIUM-ALBUTEROL 0.5-2.5 (3) MG/3ML IN SOLN
3.0000 mL | Freq: Once | RESPIRATORY_TRACT | Status: AC
  Administered 2018-07-25: 3 mL via RESPIRATORY_TRACT
  Filled 2018-07-25: qty 3

## 2018-07-25 MED ORDER — ONDANSETRON HCL 4 MG/2ML IJ SOLN
4.0000 mg | Freq: Four times a day (QID) | INTRAMUSCULAR | Status: DC
  Administered 2018-07-25 – 2018-07-26 (×4): 4 mg via INTRAVENOUS
  Filled 2018-07-25 (×4): qty 2

## 2018-07-25 MED ORDER — ASPIRIN 81 MG PO CHEW: 324.0000 mg | CHEWABLE_TABLET | ORAL | Status: AC

## 2018-07-25 MED ORDER — IPRATROPIUM-ALBUTEROL 0.5-2.5 (3) MG/3ML IN SOLN
3.0000 mL | Freq: Four times a day (QID) | RESPIRATORY_TRACT | Status: DC
  Administered 2018-07-25 – 2018-07-30 (×19): 3 mL via RESPIRATORY_TRACT
  Filled 2018-07-25 (×19): qty 3

## 2018-07-25 MED ORDER — DEXTROSE IN LACTATED RINGERS 5 % IV SOLN
INTRAVENOUS | Status: DC
  Administered 2018-07-25 – 2018-07-29 (×4): via INTRAVENOUS

## 2018-07-25 MED ORDER — INSULIN ASPART 100 UNIT/ML ~~LOC~~ SOLN
0.0000 [IU] | Freq: Three times a day (TID) | SUBCUTANEOUS | Status: DC
  Administered 2018-07-25: 1 [IU] via SUBCUTANEOUS
  Administered 2018-07-26: 5 [IU] via SUBCUTANEOUS
  Administered 2018-07-26 (×2): 3 [IU] via SUBCUTANEOUS

## 2018-07-25 MED ORDER — ETOMIDATE 2 MG/ML IV SOLN
INTRAVENOUS | Status: AC | PRN
  Administered 2018-07-25: 20 mg via INTRAVENOUS

## 2018-07-25 MED ORDER — FAMOTIDINE IN NACL 20-0.9 MG/50ML-% IV SOLN
20.0000 mg | Freq: Two times a day (BID) | INTRAVENOUS | Status: DC
  Administered 2018-07-25 – 2018-07-30 (×10): 20 mg via INTRAVENOUS
  Filled 2018-07-25 (×10): qty 50

## 2018-07-25 MED ORDER — ASPIRIN 300 MG RE SUPP
300.0000 mg | RECTAL | Status: AC
  Administered 2018-07-25: 300 mg via RECTAL
  Filled 2018-07-25: qty 1

## 2018-07-25 MED ORDER — PROPOFOL 1000 MG/100ML IV EMUL
5.0000 ug/kg/min | Freq: Once | INTRAVENOUS | Status: AC
  Administered 2018-07-25: 5 ug/kg/min via INTRAVENOUS
  Filled 2018-07-25: qty 100

## 2018-07-25 MED ORDER — SODIUM CHLORIDE 0.9 % IV BOLUS
500.0000 mL | Freq: Once | INTRAVENOUS | Status: AC
  Administered 2018-07-25: 500 mL via INTRAVENOUS

## 2018-07-25 NOTE — ED Notes (Signed)
Paged crit. care 

## 2018-07-25 NOTE — Sedation Documentation (Signed)
ET tube placed, 24 at the lip (teeth removed and placed in denture cup bedside)

## 2018-07-25 NOTE — Sedation Documentation (Signed)
Pt pre-oxygenated on Bipap. Sedation and intubation equipment gathered and prepared.

## 2018-07-25 NOTE — ED Notes (Signed)
OG tube advanced 4cm per xray recommendation. Attached to intermittent suction, draining well.

## 2018-07-25 NOTE — ED Notes (Signed)
Pt transferred to ICU with SWOT RN and Respiratory therapist

## 2018-07-25 NOTE — ED Provider Notes (Signed)
Emergency Department Provider Note   I have reviewed the triage vital signs and the nursing notes.   HISTORY  Chief Complaint Shortness of Breath   HPI Wayne Young is a 62 y.o. male with PMH of arthritis, DM, and HTN's to the emergency department for evaluation of continued exertional dyspnea with nearly 40 pounds of unintentional weight loss, and voice hoarseness.  The patient does not smoke cigarettes.  He has been following with his primary care physician and is scheduled to see several specialists in the coming week regarding these progressively worsening symptoms.  He denies any chest pain, fevers, chills.  He was provided oxygen on arrival to the emergency department and is feeling subjectively better.  He has been using his albuterol inhaler with no relief in symptoms.  He has no prior history of COPD.  Patient states he has no appetite and is not eating very much.  The wife is trying to give him Ensure shakes to improve his weight.  The patient's wife also states that he will frequently fall asleep during conversations.  No seizure activity observed.    Past Medical History:  Diagnosis Date  . Arthritis   . Diabetes mellitus without complication (HCC)   . Glaucoma   . Hypertension     Patient Active Problem List   Diagnosis Date Noted  . 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 Rheumatoid arthritis with positive rheumatoid factor (HCC) 07/13/2018  . Incidental pulmonary nodule, > 40mm and < 36mm 07/13/2018  . 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 Weight loss 07/13/2018    Past Surgical History:  Procedure Laterality Date  . HERNIA REPAIR     Allergies Patient has no known allergies.  Family History  Problem Relation Age of Onset  . Asthma Mother     Social History Social History   Tobacco Use  . Smoking status: Former Games developer  . Smokeless tobacco: Never Used  . Tobacco  comment: quit about 20 years ago   Substance Use Topics  . Alcohol use: Never    Frequency: Never  . Drug use: Never    Review of Systems  Constitutional: No fever/chills. Positive fatigue and unintentional weight loss.  Eyes: No visual changes. ENT: No sore throat. Cardiovascular: Denies chest pain. Respiratory: Positive exertional shortness of breath. Gastrointestinal: No abdominal pain.  No nausea, no vomiting.  No diarrhea.  No constipation. Genitourinary: Negative for dysuria. Musculoskeletal: Negative for back pain. Skin: Negative for rash. Neurological: Negative for headaches, focal weakness or numbness.  10-point ROS otherwise negative.  ____________________________________________   PHYSICAL EXAM:  VITAL SIGNS: ED Triage Vitals  Enc Vitals Group     BP 07/25/18 1039 (!) 133/100     Pulse Rate 07/25/18 1039 (!) 113     Resp 07/25/18 1039 (!) 24     Temp 07/25/18 1039 98.5 F (36.9 C)     Temp Source 07/25/18 1039 Oral     SpO2 07/25/18 1039 100 %     Weight 07/25/18 1040 137 lb 2 oz (62.2 kg)     Height 07/25/18 1040 5\' 7"  (1.702 m)     Pain Score 07/25/18 1040 0   Constitutional: Alert and oriented. Some voice changes noted (hoarsness) and patient is cachectic.  Eyes: Conjunctivae are normal.  Head: Atraumatic. Nose: No congestion/rhinnorhea. Mouth/Throat: Mucous membranes are moist.  Neck: No stridor.   Cardiovascular: Tachycardia. Good peripheral circulation. Grossly normal  heart sounds.   Respiratory: Normal respiratory effort.  No retractions. Lungs CTAB. Gastrointestinal: Soft and nontender. No distention.  Musculoskeletal: No lower extremity tenderness nor edema. No gross deformities of extremities. Neurologic:  Normal speech and language. No gross focal neurologic deficits are appreciated.  Skin:  Skin is warm, dry and intact. No rash noted. ____________________________________________   LABS (all labs ordered are listed, but only abnormal  results are displayed)  Labs Reviewed  BASIC METABOLIC PANEL - Abnormal; Notable for the following components:      Result Value   Chloride 95 (*)    Glucose, Bld 184 (*)    All other components within normal limits  CBC - Abnormal; Notable for the following components:   RBC 5.85 (*)    Hemoglobin 17.6 (*)    HCT 52.4 (*)    All other components within normal limits  HEPATIC FUNCTION PANEL - Abnormal; Notable for the following components:   Bilirubin, Direct 0.3 (*)    All other components within normal limits  LIPASE, BLOOD - Abnormal; Notable for the following components:   Lipase 54 (*)    All other components within normal limits  CBG MONITORING, ED - Abnormal; Notable for the following components:   Glucose-Capillary 175 (*)    All other components within normal limits  I-STAT VENOUS BLOOD GAS, ED - Abnormal; Notable for the following components:   pCO2, Ven 84.6 (*)    Bicarbonate 39.7 (*)    TCO2 42 (*)    Acid-Base Excess 8.0 (*)    All other components within normal limits  BRAIN NATRIURETIC PEPTIDE  D-DIMER, QUANTITATIVE (NOT AT [facility])  HEMOGLOBIN A1C  BLOOD GAS, ARTERIAL  I-STAT TROPONIN, ED   ____________________________________________  EKG   EKG Interpretation  Date/Time:  Monday July 25 2018 10:37:54 EDT Ventricular Rate:  112 PR Interval:  156 QRS Duration: 80 QT Interval:  348 QTC Calculation: 475 R Axis:   45 Text Interpretation:  Sinus tachycardia Cannot rule out Anterior infarct , age undetermined Abnormal ECG No STEMI.  Confirmed by Alona Bene [phone]) on 07/25/2018 11:58:52 AM       ____________________________________________  RADIOLOGY  Dg Chest 2 View  Result Date: 07/25/2018 CLINICAL DATA:  Six week history of shortness of breath with increased severity today. Also onset of laryngitis today. History of hypertension, diabetes, former smoker, positive PPD EXAM: CHEST - 2 VIEW COMPARISON:  Chest x-ray of June 28, 2018 FINDINGS:  The lungs are well-expanded. Nipple shadows are visible bilaterally. There is no focal infiltrate. There is no pleural effusion. The heart and pulmonary vascularity are normal. The mediastinum is normal in width. There is calcification in the wall of the aortic arch. The bony thorax exhibits no acute abnormality. IMPRESSION: Mild chronic bronchitic-smoking related changes. No pneumonia, CHF, nor other acute cardiopulmonary abnormality. Electronically Signed   By: David  [place] M.D.   On: 07/25/2018 11:12   Ct Head Wo Contrast  Result Date: 07/25/2018 CLINICAL DATA:  Altered level of consciousness EXAM: CT HEAD WITHOUT CONTRAST TECHNIQUE: Contiguous axial images were obtained from the base of the skull through the vertex without intravenous contrast. COMPARISON:  None. FINDINGS: Brain: No evidence of acute infarction, hemorrhage, hydrocephalus, extra-axial collection or mass lesion/mass effect. Vascular: No hyperdense vessel or unexpected calcification. Skull: Normal. Negative for fracture or focal lesion. Sinuses/Orbits: No acute finding. Other: None. IMPRESSION: Normal head CT Electronically Signed   By: Alcide Clever M.D.   On: 07/25/2018 13:40   Ct Soft Tissue  Neck W Contrast  Result Date: 07/25/2018 CLINICAL DATA:  62 year old male with sore throat, stridor, shortness of breath, unintentional weight loss. EXAM: CT NECK WITH CONTRAST TECHNIQUE: Multidetector CT imaging of the neck was performed using the standard protocol following the bolus administration of intravenous contrast. CONTRAST:  59mL ISOVUE-300 IOPAMIDOL (ISOVUE-300) INJECTION 61%, ISOVUE-300 IOPAMIDOL (ISOVUE-300) INJECTION 61% COMPARISON:  Head CT 1309 hours today.  Chest CT 07/08/2018. FINDINGS: Pharynx and larynx: Laryngeal soft tissue contours including the epiglottis appear symmetric and normal. Visible subglottic trachea is normal. Mild motion artifact throughout the pharynx. Pharyngeal soft tissue contours are within normal  limits. No suspicious enhancement or discrete pharyngeal soft tissue mass. Gas-filled oral cavity appears within normal limits. Negative parapharyngeal and retropharyngeal spaces. Salivary glands: Sublingual space, submandibular glands, and parotid glands appear symmetric and normal. Thyroid: Negative. Lymph nodes: Bilateral level IIb and IIIb lymph nodes appear perhaps mildly increased in number (series 8, image 73 on the left and image 31 on the right), but remain normal by size criteria. No heterogeneous, cystic or necrotic nodes identified. The other lymph node stations appear normal. Vascular: Major vascular structures in the neck and at the skull base are patent. No significant atherosclerosis identified. Limited intracranial: Negative. Visualized orbits: Not included. Mastoids and visualized paranasal sinuses: Mild mucosal thickening and bubbly opacity in the posterior left maxillary sinus. Otherwise well pneumatized. Skeleton: Absent dentition. Generally mild for age cervical spine degeneration. There is moderate facet degeneration at C2-C3 on the left. No acute or suspicious osseous lesion. Upper chest: Stable visible lung apices. Centrilobular emphysema. Stable visible superior mediastinum. The AP window was not included today. IMPRESSION: 1. Normal CT appearance of the neck aside from questionably increased number of small lymph nodes at the bilateral level IIb and IIIb stations. But considering the lack of additional findings this may be physiologic for this patient rather than due to a lymphoproliferative disorder. 2. Negative pharynx, larynx, and visible airway. 3. Stable visible upper chest, Emphysema (ICD10-J43.9). Electronically Signed   By: Odessa Fleming M.D.   On: 07/25/2018 13:51   Ct Abdomen Pelvis W Contrast  Result Date: 07/25/2018 CLINICAL DATA:  Weight loss EXAM: CT ABDOMEN AND PELVIS WITH CONTRAST TECHNIQUE: Multidetector CT imaging of the abdomen and pelvis was performed using the standard  protocol following bolus administration of intravenous contrast. CONTRAST:  78mL ISOVUE-300 IOPAMIDOL (ISOVUE-300) INJECTION 61%, ISOVUE-300 IOPAMIDOL (ISOVUE-300) INJECTION 61% COMPARISON:  None. FINDINGS: Lower chest: There is bibasilar atelectatic change. Hepatobiliary: There is fatty infiltration near the fissure for the ligamentum teres. There is a 7 mm probable cyst in the anterior segment right lobe of the liver. There is a 5 mm probable cyst in the right lobe of the liver adjacent to the gallbladder fossa. Liver otherwise appears unremarkable. There is no appreciable gallbladder wall thickening. No biliary duct dilatation. Pancreas: There is no pancreatic mass or inflammatory focus. Spleen: No splenic lesions are evident. Adrenals/Urinary Tract: Adrenals bilaterally appear unremarkable. There is a 6 mm cyst in the posterior mid left kidney. There is no evident hydronephrosis on either side. There is no renal or ureteral calculus on either side. Urinary bladder is midline. The wall thickness of the bladder is upper normal for degree of bladder distention. Stomach/Bowel: Rectum is distended with air. There is no appreciable bowel wall or mesenteric thickening. There is no evident bowel obstruction. No free air or portal venous air. Note that there is localized narrowing of the third portion of the duodenum between the superior  mesenteric artery and abdominal aorta. The patient has a scaphoid appearing abdomen which may exacerbate this finding. Vascular/Lymphatic: There Is aortic atherosclerosis. There is also atherosclerotic calcification in the left common iliac artery. No aneurysm evident. Major mesenteric arterial vessels appear patent. Reproductive: The prostate appears inhomogeneous in attenuation and somewhat lobulated along its superior aspect. There is loss of clear fat plane between portions of the prostate and the inferior aspect of the urinary bladder. No pelvic mass apart from potential  lesion within the prostate evident. Other: Appendix appears normal. No abscess or ascites is evident in the abdomen or pelvis. Postoperative changes noted in the left inguinal region. There is scarring in this area but no appreciable hernia. Musculoskeletal: There is vacuum phenomenon at L5-S1. There are foci of degenerative change in the lumbar spine. There are no blastic or lytic bone lesions. There is decreased attenuation in the medial aspect of the left iliopsoas muscle extending over a distance of 7.0 cm from superior to inferior dimension. This lesion measures 1.8 cm in right to left dimension and 3.1 cm from anterior to posterior dimension. This lesion appears well delineated, with a clear delineation between this lesion and the remainder of the left ileus psoas muscle. IMPRESSION: 1. The prostate appears somewhat inhomogeneous and lobulated along its superior aspect. There is loss of fat plane between the superior prostate and the inferior bladder. This appearance raises concern for possible neoplastic involvement of the prostate. In this regard, clinical assessment and PSA advised. 2. Scaphoid appearing abdomen. Localized focal narrowing involving a short segment of the third portion of the duodenum between the superior mesenteric artery and the aorta. Question a degree of SMA syndrome. 3. Decreased attenuation lesion tracking along the medial aspect of the left iliopsoas muscle in the pelvis as noted. Question liquified hematoma in this area. This appearance is atypical for neoplasm, although an atypical neoplasm arising in the left ileus psoas muscle conceivably could present in this manner. 4. Previous left inguinal hernia repair with scarring but no recurrent hernia in this area. 5. No abscess in the abdomen or pelvis. No bowel obstruction. Appendix appears normal. 6.  No evident renal or ureteral calculus. 7. Urinary bladder wall upper normal in thickness. It may be prudent to correlate with  urinalysis to exclude early cystitis. 8.  Aortic atherosclerosis. Aortic Atherosclerosis (ICD10-I70.0). Electronically Signed   By: Bretta Bang III M.D.   On: 07/25/2018 13:52   Dg Chest Portable 1 View  Result Date: 07/25/2018 CLINICAL DATA:  Orogastric and endotracheal tube placement EXAM: PORTABLE CHEST 1 VIEW COMPARISON:  07/25/2018 FINDINGS: Endotracheal tube tip is 5.4 cm above the carina in satisfactory position. Gastric tube side port is noted at the GE junction and further advancement by at least 3-4 cm is recommended. Bibasilar atelectasis is noted. No overt pulmonary edema. No effusion or pneumothorax. Heart size is normal. Mild uncoiling of the thoracic aorta with minimal aortic atherosclerosis. No aneurysm. No acute osseous abnormality. IMPRESSION: 1. Further advancement of gastric tube is suggested as the side-port is noted at the GE junction. Further advancement recommended by at least 3-4 cm. 2. Satisfactory endotracheal tube positioning. 3. Bibasilar atelectasis. Electronically Signed   By: Tollie Eth M.D.   On: 07/25/2018 15:42    ____________________________________________   PROCEDURES  Procedure(s) performed:   .Critical Care Performed by: Maia Plan, MD Authorized by: Maia Plan, MD   Critical care provider statement:    Critical care time (minutes):  50   Critical  care time was exclusive of:  Separately billable procedures and treating other patients and teaching time   Critical care was necessary to treat or prevent imminent or life-threatening deterioration of the following conditions:  Respiratory failure   Critical care was time spent personally by me on the following activities:  Development of treatment plan with patient or surrogate, blood draw for specimens, discussions with consultants, evaluation of patient's response to treatment, examination of patient, obtaining history from patient or surrogate, ordering and performing treatments and  interventions, ordering and review of laboratory studies, ordering and review of radiographic studies, pulse oximetry, re-evaluation of patient's condition, review of old charts and ventilator management   I assumed direction of critical care for this patient from another provider in my specialty: no   Procedure Name: Intubation Date/Time: 07/25/2018 3:51 PM Performed by: Maia Plan, MD Pre-anesthesia Checklist: Patient identified, Timeout performed, Emergency Drugs available, Suction available and Patient being monitored Oxygen Delivery Method: Nasal cannula Preoxygenation: Pre-oxygenation with 100% oxygen Induction Type: Rapid sequence Laryngoscope Size: Glidescope and 4 Grade View: Grade I Tube size: 8.0 mm Number of attempts: 1 Airway Equipment and Method: Video-laryngoscopy Placement Confirmation: ETT inserted through vocal cords under direct vision,  Positive ETCO2,  CO2 detector and Breath sounds checked- equal and bilateral Secured at: 24 (gum) cm Tube secured with: ETT holder Dental Injury: Teeth and Oropharynx as per pre-operative assessment         ____________________________________________   INITIAL IMPRESSION / ASSESSMENT AND PLAN / ED COURSE  Pertinent labs & imaging results that were available during my care of the patient were reviewed by me and considered in my medical decision making (see chart for details).  Patient presents to the emergency department with progressively worsening exertional dyspnea with weight loss, voice changes, and altered mental status.  Symptoms have been progressively worsening over the past 5 weeks.  No significant improvement with albuterol at home.  Patient occasionally nods off during my discussion with him.  He is not on any pain medications.  Lungs are relatively clear.  He had a recent CT scan with contrast of the chest which showed 2 pulmonary nodules mild emphysematous changes.  Patient has outpatient CT abdomen pelvis  scheduled.  Plan for imaging of the abdomen and pelvis here along with CT neck and head with concern for possible malignant process. Doubt PE with no significant worsening symptoms today and 4 months of gradually worsening symptoms. Patient denies any chest pain and patient with no significant hypoxemia.   Labs and CT scans reviewed. Normal CT head. Non-specific lymphadenopathy on the CT neck. Abdominal CT with questionable prostate irregularity and recommending PSA correlation. Patient with elevated CO2 on VBG which I think explains the patient's AMS. CXR reviewed with no edema or PNA. Patient not clinically volume overloaded. Will give additional nebs and BiPAP with frequent reassessment.   3:55 PM Patient becoming more difficult to arouse.  Does not seem to be improving significantly with BiPAP or nebulizer therapy.  Continues to not be wheezing significantly.  Discussed with wife that at this point we should intubate for both airway protection and treatment of his hypercarbic respiratory failure.  I discussed the case with the ICU and will be down to admit.  There was some peri-intubation hypotension which resolved with fluids.   Discussed patient's case with ICU to request admission. Patient and family (if present) updated with plan. Care transferred to ICU service.  I reviewed all nursing notes, vitals, pertinent old records, EKGs,  labs, imaging (as available).  ____________________________________________  FINAL CLINICAL IMPRESSION(S) / ED DIAGNOSES  Final diagnoses:  Acute respiratory failure with hypercapnia (HCC)  Soft hoarse voice  Recent unintentional weight loss over several months  Failure to thrive in adult     MEDICATIONS GIVEN DURING THIS VISIT:  Medications  propofol (DIPRIVAN) 1000 MG/100ML infusion (has no administration in time range)  fentaNYL (SUBLIMAZE) injection 100 mcg (has no administration in time range)  insulin aspart (novoLOG) injection 0-9 Units (has no  administration in time range)  sodium chloride 0.9 % bolus 500 mL (0 mLs Intravenous Stopped 07/25/18 1314)  ipratropium-albuterol (DUONEB) 0.5-2.5 (3) MG/3ML nebulizer solution 3 mL (3 mLs Nebulization Given 07/25/18 1347)  iopamidol (ISOVUE-300) 61 % injection 100 mL (100 mLs Intravenous Contrast Given 07/25/18 1256)  iopamidol (ISOVUE-300) 61 % injection 30 mL (30 mLs Intravenous Contrast Given 07/25/18 1255)  ipratropium-albuterol (DUONEB) 0.5-2.5 (3) MG/3ML nebulizer solution 3 mL (3 mLs Nebulization Given 07/25/18 1439)  etomidate (AMIDATE) injection (20 mg Intravenous Given 07/25/18 1519)  rocuronium (ZEMURON) injection (100 mg Intravenous Given 07/25/18 1519)    Note:  This document was prepared using Dragon voice recognition software and may include unintentional dictation errors.  Alona Bene, MD Emergency Medicine    Long, Arlyss Repress, MD 07/25/18 [phone]

## 2018-07-25 NOTE — Progress Notes (Signed)
eLink Physician-Brief Progress Note Patient Name: Wayne Young DOB: [birth year] MRN: [id number]   Date of Service  07/25/2018  HPI/Events of Note  Oliguria - Request for Coude Foley.   eICU Interventions  Will order: 1. Send PSA level STAT. 2. Place Coude Foley catheter.      Intervention Category Intermediate Interventions: Oliguria - evaluation and management  [name] 07/25/2018, 10:40 PM

## 2018-07-25 NOTE — H&P (Signed)
PULMONARY / CRITICAL CARE MEDICINE   NAME:  Wayne Young, MRN:  [id number], DOB:  1956/01/10, LOS: 0 ADMISSION DATE:  07/25/2018,  REFERRING MD:  ER, CHIEF COMPLAINT:  Dyspnea  BRIEF HISTORY:    Several months of activity limiting dyspnea and involuntary weight loss.  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  Apparently over the past 2 to 4 months he has had insidiously progressive dyspnea progressing to the point where he cannot walk across the room without stopping.  His wife is a somewhat difficult historian and the patient has already been intubated time of my arrival.  Apparently he does have a nonproductive cough, he is not having any known adenopathy or hemoptysis.  Concurrently he is having substantial weight loss, she believes he has lost 33 pounds involuntarily in the past several months.  She is also observed that he has a very weak raspy voice.  He has not had concurrent difficulties with overt aspiration, coughing after attempting to eat, or constipation to suggest vocal cord paralysis.  Outpatient evaluation is included a CT scan of the chest on 9/20 which showed small nodules and emphysema.  A CT scan of the abdomen and pelvis performed to evaluate late weight loss showed an irregular prostate.  HIV is negative.  Echo on 9/26 is normal SIGNIFICANT PAST MEDICAL HISTORY   Type 2 diabetes and rheumatoid arthritis treated with methotrexate  SIGNIFICANT EVENTS:  Intubated following presentation on 10/7 STUDIES:    CULTURES:    ANTIBIOTICS:  None  LINES/TUBES:    CONSULTANTS:   SUBJECTIVE:  As above  CONSTITUTIONAL: BP (!) 139/91   Pulse 85   Temp 98.5 F (36.9 C) (Oral)   Resp 16   Ht 5\' 11"  (1.803 m)   Wt 62.2 kg   SpO2 99%   BMI 19.13 kg/m   No intake/output data recorded.     Vent Mode:  PRVC FiO2 (%):  [40 %-100 %] 100 % Set Rate:  [16 bmp] 16 bmp Vt Set:  [600 mL] 600 mL PEEP:  [5 cmH20] 5 cmH20 Plateau Pressure:  [12 cmH20] 12 cmH20  PHYSICAL EXAM: General: Unfortunately he is just received sedation and paralysis for intubation and I do not have a neurological examination.  He is orally intubated mechanically ventilated. Neuro: Logically paralyzed, pupils appear to be equal HEENT: No cervical or supraclavicular adenopathy. Cardiovascular: S1 and S2 are regular without murmur rub or gallop. Lungs: There is symmetric air movement, scattered rhonchi and no wheezes Abdomen: Abdomen is scaphoid without any overt organomegaly masses tenderness guarding or rebound Musculoskeletal: Periphery is warm without any dependent edema   RESOLVED PROBLEM LIST   ASSESSMENT AND PLAN   62 year old with emphysema and insidiously progressive dyspnea associated with involuntary weight loss.  He was intubated in the department emergency medicine today for suspected hypercarbia and decreased mental status.  SUMMARY OF TODAY'S PLAN:  1.  Increased mental status.  CT has been obtained.  A ionized calcium is pending in addition to a blood gas, as based on the venous gas I suspect that hypercarbia is not the primary contributor. 2.  Chronic dyspnea.  Today's chest x-ray does not suggest an acute event either cardiac or infectious.  I have ordered a d-dimer although recent echo does not show right heart distention and my suspicion of chronic  thromboembolic disease is very limited.  I suspect his chronic dyspnea is primarily on the basis of emphysema, my suspicion of an opportunistic infection related to chronic methotrexate exposure is very limited, and he is HIV negative.  I am concerned that weight loss may be a separate issue from what is going on in his chest, a PSA is pending.  Best Practice / Goals of Care / Disposition.   DVT PROPHYLAXIS: Lovenox has been ordered SUP:  Pepcid NUTRITION: MOBILITY: Bedrest for present GOALS OF CARE: FAMILY DISCUSSIONS: Wife instructed me that all aggressive measures are appropriate DISPOSITION  LABS  Glucose Recent Labs  Lab 07/25/18 1132  GLUCAP 175*    BMET Recent Labs  Lab 07/25/18 1102  NA 137  K 3.6  CL 95*  CO2 30  BUN 8  CREATININE 0.77  GLUCOSE 184*    Liver Enzymes Recent Labs  Lab 07/25/18 1102  AST 21  ALT 19  ALKPHOS 46  BILITOT 1.2  ALBUMIN 3.9    Electrolytes Recent Labs  Lab 07/25/18 1102  CALCIUM 9.5    CBC Recent Labs  Lab 07/25/18 1102  WBC 4.4  HGB 17.6*  HCT 52.4*  PLT 216    ABG No results for input(s): PHART, PCO2ART, PO2ART in the last 168 hours.  Coag's No results for input(s): APTT, INR in the last 168 hours.  Sepsis Markers No results for input(s): LATICACIDVEN, PROCALCITON, O2SATVEN in the last 168 hours.  Cardiac Enzymes No results for input(s): TROPONINI, PROBNP in the last 168 hours.  PAST MEDICAL HISTORY :   He  has a past medical history of Arthritis, Diabetes mellitus without complication (HCC), Glaucoma, and Hypertension.  PAST SURGICAL HISTORY:  He  has a past surgical history that includes Hernia repair.  No Known Allergies  No current facility-administered medications on file prior to encounter.    Current Outpatient Medications on File Prior to Encounter  Medication Sig  . amLODipine (NORVASC) 10 MG tablet Take 10 mg by mouth daily.   Marland Kitchen atorvastatin (LIPITOR) 20 MG tablet Take 20 mg by mouth at bedtime.  . folic acid (FOLVITE) 1 MG tablet Take by mouth.  Marland Kitchen HUMULIN R 100 UNIT/ML injection Inject 5-6 Units into the skin See admin instructions. 5 units in the morning and 6 units at night  . hydrochlorothiazide (HYDRODIURIL) 25 MG tablet Take by mouth.  . insulin NPH Human (HUMULIN N,NOVOLIN N) 100 UNIT/ML injection Inject into the skin.  . metFORMIN (GLUCOPHAGE) 850 MG tablet Take by mouth.  . methotrexate (RHEUMATREX) 2.5 MG  tablet Take by mouth.  . Multiple Vitamin (MULTIVITAMIN) tablet Take 1 tablet by mouth daily.  . pantoprazole (PROTONIX) 40 MG tablet Take 40 mg by mouth daily before breakfast.     FAMILY HISTORY:   His family history includes Asthma in his mother.  SOCIAL HISTORY:  He  reports that he has quit smoking. He has never used smokeless tobacco. He reports that he does not drink alcohol or use drugs.  REVIEW OF SYSTEMS:    10 system review of systems otherwise contributes is not having trouble with obvious dysphagia she does report that anything he eats rapidly "goes through him".  He is not having difficulties with his diabetic control very rarely having a blood sugar greater than 175.  Greater than 32 minutes was spent in the care of this unstable patient today.  Penny Pia, MD

## 2018-07-25 NOTE — ED Triage Notes (Signed)
Pt endorses shob "for quite a while" Seen here last month for same and referred to a special. Going to see the ENT tomorrow "about my throat and nose" Tachy. Axox4. Speaking 4-5 word sentences.

## 2018-07-25 NOTE — ED Notes (Signed)
Pt placed on Bipap by Respiratory therapist 

## 2018-07-25 NOTE — Progress Notes (Signed)
Pt transported on vent from ED to 2M05 without complications.

## 2018-07-25 NOTE — Sedation Documentation (Signed)
OG tube placed, verified by auscultation by this RN.

## 2018-07-26 ENCOUNTER — Inpatient Hospital Stay (HOSPITAL_COMMUNITY): Payer: Medicaid Other

## 2018-07-26 DIAGNOSIS — R0600 Dyspnea, unspecified: Secondary | ICD-10-CM

## 2018-07-26 DIAGNOSIS — J9690 Respiratory failure, unspecified, unspecified whether with hypoxia or hypercapnia: Secondary | ICD-10-CM

## 2018-07-26 LAB — GLUCOSE, CAPILLARY
GLUCOSE-CAPILLARY: 210 mg/dL — AB (ref 70–99)
GLUCOSE-CAPILLARY: 249 mg/dL — AB (ref 70–99)
GLUCOSE-CAPILLARY: 181 mg/dL — AB (ref 70–99)
Glucose-Capillary: 235 mg/dL — ABNORMAL HIGH (ref 70–99)
Glucose-Capillary: 278 mg/dL — ABNORMAL HIGH (ref 70–99)
Glucose-Capillary: 81 mg/dL (ref 70–99)

## 2018-07-26 LAB — BLOOD GAS, ARTERIAL
Acid-Base Excess: 5.6 mmol/L — ABNORMAL HIGH (ref 0.0–2.0)
Bicarbonate: 28.1 mmol/L — ABNORMAL HIGH (ref 20.0–28.0)
DRAWN BY: 35043
FIO2: 40
O2 SAT: 98.8 %
PEEP: 5 cmH2O
PH ART: 7.569 — AB (ref 7.350–7.450)
Patient temperature: 98.6
RATE: 16 resp/min
VT: 600 mL
pCO2 arterial: 30.7 mmHg — ABNORMAL LOW (ref 32.0–48.0)
pO2, Arterial: 117 mmHg — ABNORMAL HIGH (ref 83.0–108.0)

## 2018-07-26 LAB — PROCALCITONIN: PROCALCITONIN: 0.11 ng/mL

## 2018-07-26 LAB — CBC
HEMATOCRIT: 45.6 % (ref 39.0–52.0)
HEMOGLOBIN: 15.4 g/dL (ref 13.0–17.0)
MCH: 29.9 pg (ref 26.0–34.0)
MCHC: 33.8 g/dL (ref 30.0–36.0)
MCV: 88.5 fL (ref 80.0–100.0)
Platelets: 143 10*3/uL — ABNORMAL LOW (ref 150–400)
RBC: 5.15 MIL/uL (ref 4.22–5.81)
RDW: 12.3 % (ref 11.5–15.5)
WBC: 5.3 10*3/uL (ref 4.0–10.5)

## 2018-07-26 LAB — URINALYSIS, ROUTINE W REFLEX MICROSCOPIC
Bilirubin Urine: NEGATIVE
Glucose, UA: >=500 mg/dL — AB
Ketones, ur: 20 mg/dL — AB
Nitrite: NEGATIVE
Protein, ur: 30 mg/dL — AB
RBC / HPF: >50 RBC/hpf — ABNORMAL HIGH (ref 0–5)
pH: 6 (ref 5.0–8.0)

## 2018-07-26 LAB — MAGNESIUM: MAGNESIUM: 1.8 mg/dL (ref 1.7–2.4)

## 2018-07-26 LAB — PHOSPHORUS: Phosphorus: 1.9 mg/dL — ABNORMAL LOW (ref 2.5–4.6)

## 2018-07-26 MED ORDER — INSULIN GLARGINE 100 UNIT/ML ~~LOC~~ SOLN
12.0000 [IU] | Freq: Every day | SUBCUTANEOUS | Status: DC
  Administered 2018-07-27 – 2018-07-28 (×2): 12 [IU] via SUBCUTANEOUS
  Filled 2018-07-26 (×3): qty 0.12

## 2018-07-26 MED ORDER — POTASSIUM PHOSPHATES 15 MMOLE/5ML IV SOLN
20.0000 mmol | Freq: Once | INTRAVENOUS | Status: AC
  Administered 2018-07-26: 20 mmol via INTRAVENOUS
  Filled 2018-07-26: qty 6.67

## 2018-07-26 MED ORDER — VANCOMYCIN HCL IN DEXTROSE 750-5 MG/150ML-% IV SOLN
750.0000 mg | Freq: Three times a day (TID) | INTRAVENOUS | Status: DC
  Administered 2018-07-26 – 2018-07-27 (×5): 750 mg via INTRAVENOUS
  Filled 2018-07-26 (×5): qty 150

## 2018-07-26 MED ORDER — INSULIN ASPART 100 UNIT/ML ~~LOC~~ SOLN
0.0000 [IU] | SUBCUTANEOUS | Status: DC
  Administered 2018-07-26 – 2018-07-27 (×2): 2 [IU] via SUBCUTANEOUS
  Administered 2018-07-27: 1 [IU] via SUBCUTANEOUS
  Administered 2018-07-27: 2 [IU] via SUBCUTANEOUS
  Administered 2018-07-27: 1 [IU] via SUBCUTANEOUS
  Administered 2018-07-28 – 2018-07-29 (×2): 2 [IU] via SUBCUTANEOUS
  Administered 2018-07-29: 3 [IU] via SUBCUTANEOUS
  Administered 2018-07-29: 2 [IU] via SUBCUTANEOUS
  Administered 2018-07-29: 1 [IU] via SUBCUTANEOUS

## 2018-07-26 MED ORDER — PIPERACILLIN-TAZOBACTAM 3.375 G IVPB
3.3750 g | Freq: Three times a day (TID) | INTRAVENOUS | Status: DC
  Administered 2018-07-26 – 2018-08-04 (×28): 3.375 g via INTRAVENOUS
  Filled 2018-07-26 (×31): qty 50

## 2018-07-26 MED ORDER — ACETAMINOPHEN 160 MG/5ML PO SOLN
650.0000 mg | Freq: Four times a day (QID) | ORAL | Status: DC | PRN
  Administered 2018-07-26 – 2018-08-10 (×3): 650 mg
  Filled 2018-07-26 (×3): qty 20.3

## 2018-07-26 MED ORDER — INSULIN GLARGINE 100 UNIT/ML ~~LOC~~ SOLN
8.0000 [IU] | Freq: Every day | SUBCUTANEOUS | Status: DC
  Administered 2018-07-26: 8 [IU] via SUBCUTANEOUS
  Filled 2018-07-26: qty 0.08

## 2018-07-26 MED ORDER — LIDOCAINE HCL URETHRAL/MUCOSAL 2 % EX GEL
1.0000 "application " | Freq: Once | CUTANEOUS | Status: AC
  Administered 2018-07-26: 1 via URETHRAL
  Filled 2018-07-26: qty 5

## 2018-07-26 MED ORDER — INSULIN GLARGINE 100 UNIT/ML ~~LOC~~ SOLN
8.0000 [IU] | Freq: Every day | SUBCUTANEOUS | Status: DC
  Filled 2018-07-26: qty 0.08

## 2018-07-26 NOTE — Progress Notes (Signed)
Pharmacy Antibiotic Note  Wayne Young is a 62 y.o. male admitted on 07/25/2018 with fever.  Pharmacy has been consulted for Vancomycin and Zosyn dosing. TM 101.1, WBC wnl.  Plan: Zosyn 3.375gm IV q8h - doses over 4 hours Vancomycin 750mg  IV q8h Will f/u micro data, renal function, and pt's clinical condition Vanc trough prn  Height: 5\' 11"  (180.3 cm) Weight: 137 lb 2 oz (62.2 kg) IBW/kg (Calculated) : 75.3  Temp (24hrs), Avg:99.3 F (37.4 C), Min:98.5 F (36.9 C), Max:101.1 F (38.4 C)  Recent Labs  Lab 07/25/18 1102  WBC 4.4  CREATININE 0.77    Estimated Creatinine Clearance: 82.1 mL/min (by C-G formula based on SCr of 0.77 mg/dL).    No Known Allergies  Antimicrobials this admission: 10/8 Vanc >>  10/8 Zosyn >>   Microbiology results:  BCx:  10/7 MRSA PCR: negative  Thank you for allowing pharmacy to be a part of this patient's care.  12/8, PharmD, BCPS Clinical pharmacist  **Pharmacist phone directory can now be found on [email or web address] ([email or web address]).  Listed under [facility]. 07/26/2018 12:18 AM

## 2018-07-26 NOTE — Progress Notes (Signed)
RT collected sputum sample from ETT with no complications

## 2018-07-26 NOTE — Progress Notes (Signed)
eLink Physician-Brief Progress Note Patient Name: Wayne Young DOB: January 22, 1956 MRN: [id number]   Date of Service  07/26/2018  HPI/Events of Note  Hyperglycemia - Blood glucose = 181. Patient is on [facility] sensitive Novolog with meals coverage. Patient NPO and on enteral feeds.   eICU Interventions  Will change to Q 4 hour sensitive Novolog SSI.     Intervention Category Major Interventions: Hyperglycemia - active titration of insulin therapy  [name] 07/26/2018, 8:08 PM

## 2018-07-26 NOTE — Progress Notes (Signed)
Inpatient Diabetes Program Recommendations  AACE/ADA: New Consensus Statement on Inpatient Glycemic Control (2015)  Target Ranges:  Prepandial:   less than 140 mg/dL      Peak postprandial:   less than 180 mg/dL (1-2 hours)      Critically ill patients:  140 - 180 mg/dL   Lab Results  Component Value Date   GLUCAP 278 (H) 07/26/2018   HGBA1C 8.1 (H) 07/25/2018     Results for WHEELER, INCORVAIA (MRN [id number]) as of 07/26/2018 13:53  Ref. Range 07/25/2018 19:58 07/25/2018 23:23 07/26/2018 03:45 07/26/2018 07:35 07/26/2018 11:38  Glucose-Capillary Latest Ref Range: 70 - 99 mg/dL 852 (H) 778 (H) 242 (H) 278 (H) 210 (H)    History DM2  Home DM meds: Humulin R 5 units am/ 6 units pm                             NPH unknown dose                              Glucophage 850mg  unknown frequency  Current inpatient DM meds: Novolog sensitive (0-9 units) correction scale tid ac                                              Lantus 8 units q hs (first dose tonight)                                                    MD please consider the following inpatient insulin adjustments:  Change Novolog sensitive (0-9 units) correction scale to Q4 hours as patient NPO. (Currently ordered tid ac.)   Thank you.  -- Will follow during hospitalization.--  RN, MSN Diabetes Coordinator Inpatient Glycemic Control Team Team Pager: [phone] (8am-5pm)

## 2018-07-26 NOTE — Progress Notes (Signed)
eLink Physician-Brief Progress Note Patient Name: Wayne Young DOB: 1955-11-02 MRN: [id number]   Date of Service  07/26/2018  HPI/Events of Note  Fever to 101.1 F - Etiology uncertain. LFT's uncertain. AST and ALT both normal.   eICU Interventions  Will order: 1. Blood cultures X 2.  2. Vancomycin and Zosyn per pharmacy consult. 3. Urine culture if urine looks cloudy when coude Foley catheter placed.  4. Tylenol elixir 650 mg per tube Q 4 hours PRN Temp > 101.0 F.      Intervention Category Major Interventions: Infection - evaluation and management  Sommer,Steven Eugene 07/26/2018, 12:12 AM

## 2018-07-26 NOTE — Progress Notes (Signed)
PULMONARY / CRITICAL CARE MEDICINE   NAME:  Wayne Young, MRN:  [id number], DOB:  02-Oct-1956, LOS: 1 ADMISSION DATE:  07/25/2018,   CHIEF COMPLAINT: Dyspnea and declining mental status.  BRIEF HISTORY:         This is a 62 year old with a history of rheumatoid arthritis normally treated with methotrexate and a history of a positive PPD for which she received 6 months of prophylaxis who presented with insidiously progressive shortness of breath and involuntary weight loss.  He was intubated in the department of emergency medicine HISTORY OF PRESENT ILLNESS   In addition to the above he is known to be HIV negative. SIGNIFICANT PAST MEDICAL HISTORY    Rheumatoid arthritis, positive PPD. SIGNIFICANT EVENTS:   STUDIES:    CULTURES:  No positive cultures to date  ANTIBIOTICS:  Zosyn and vancomycin added 10/7  LINES/TUBES:   No lines CONSULTANTS:   SUBJECTIVE:      He was febrile overnight and empiric antibiotics were initiated.  He has been oliguric despite the fact that bladder scan shows urine in the bladder.  Why he did this  CONSTITUTIONAL: BP 92/73   Pulse (!) 109   Temp 99.1 F (37.3 C) (Oral)   Resp 16   Ht 5\' 11"  (1.803 m)   Wt 61.6 kg   SpO2 99%   BMI 18.94 kg/m   I/O last 3 completed shifts: In: 1824 [I.V.:1087.3; IV Piggyback:736.7] Out: 0      Vent Mode: PRVC FiO2 (%):  [30 %-100 %] 30 % Set Rate:  [16 bmp] 16 bmp Vt Set:  [600 mL] 600 mL PEEP:  [5 cmH20] 5 cmH20 Plateau Pressure:  [12 cmH20-15 cmH20] 13 cmH20  PHYSICAL EXAM: General: Cachectic elderly male who can be awakened despite fentanyl and to nods appropriately to questions Neuro: Nodding appropriately to questions and moving all fours HEENT: No JVD Cardiovascular: S1 and S2 are quite distant and regular without murmur rub or gallop Lungs: Respirations are unlabored, he breathes about the ventilator after decreasing the set rate to 12.  There is symmetric air movement some scattered rhonchi  and no wheezes. Abdomen: The abdomen is scaphoid without any organomegaly masses tenderness guarding or rebound Musculoskeletal: No edema   RESOLVED PROBLEM LIST   ASSESSMENT AND PLAN   1.  Dyspnea.  No evidence on chest x-ray of new pulmonary pathology.  D-dimer is markedly positive and I have ordered Dopplers of the lower extremities.  Doubt a diagnosis of PE however with his very low minute ventilatory requirements. 2.  Weight loss.  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  CT scan of the chest abdomen and pelvis is really not remarkable except for an enlarged prostate and his PSA is normal.  His wife does state that his food "goes right through him," and we may need to work him up for malabsorption state if no other significant findings revealed themselves.  3.  Fever.  He has been broadly covered with antibiotics and I am most suspicious of urinary tract infection is the provocation.  Unfortunately we are not able to place a Foley and I have consulted urology.  A procalcitonin has been ordered. SUMMARY OF TODAY'S PLAN:    Best Practice / Goals of Care / Disposition.   DVT PROPHYLAXIS: Lovenox SUP: Pepcid NUTRITION: I am anticipating extubation in the very near future and p.o. intake MOBILITY  LABS  Glucose Recent Labs  Lab 07/25/18 1637 07/25/18 1705 07/25/18  1958 07/25/18 2323 07/26/18 0345 07/26/18 0735  GLUCAP 144* 129* 161* 155* 235* 278*    BMET Recent Labs  Lab 07/25/18 1102  NA 137  K 3.6  CL 95*  CO2 30  BUN 8  CREATININE 0.77  GLUCOSE 184*    Liver Enzymes Recent Labs  Lab 07/25/18 1102  AST 21  ALT 19  ALKPHOS 46  BILITOT 1.2  ALBUMIN 3.9    Electrolytes Recent Labs  Lab 07/25/18 1102 07/26/18 0131  CALCIUM 9.5  --   MG  --  1.8  PHOS  --  1.9*    CBC Recent Labs  Lab 07/25/18 1102 07/26/18 0131  WBC 4.4 5.3  HGB 17.6* 15.4  HCT 52.4* 45.6  PLT 216 143*    ABG Recent Labs  Lab 07/25/18 1630  07/26/18 0445  PHART 7.453* 7.569*  PCO2ART 52.2* 30.7*  PO2ART 499.0* 117*    Coag's No results for input(s): APTT, INR in the last 168 hours.  Sepsis Markers No results for input(s): LATICACIDVEN, PROCALCITON, O2SATVEN in the last 168 hours.  Cardiac Enzymes No results for input(s): TROPONINI, PROBNP in the last 168 hours.  PAST MEDICAL HISTORY :   He  has a past medical history of Arthritis, Diabetes mellitus without complication (HCC), Glaucoma, and Hypertension.  PAST SURGICAL HISTORY:  He  has a past surgical history that includes Hernia repair.  No Known Allergies  No current facility-administered medications on file prior to encounter.    Current Outpatient Medications on File Prior to Encounter  Medication Sig  . amLODipine (NORVASC) 10 MG tablet Take 10 mg by mouth daily.   Marland Kitchen atorvastatin (LIPITOR) 20 MG tablet Take 20 mg by mouth at bedtime.  . folic acid (FOLVITE) 1 MG tablet Take by mouth.  Marland Kitchen HUMULIN R 100 UNIT/ML injection Inject 5-6 Units into the skin See admin instructions. 5 units in the morning and 6 units at night  . hydrochlorothiazide (HYDRODIURIL) 25 MG tablet Take by mouth.  . insulin NPH Human (HUMULIN N,NOVOLIN N) 100 UNIT/ML injection Inject into the skin.  . metFORMIN (GLUCOPHAGE) 850 MG tablet Take by mouth.  . methotrexate (RHEUMATREX) 2.5 MG tablet Take by mouth.  . Multiple Vitamin (MULTIVITAMIN) tablet Take 1 tablet by mouth daily.  . pantoprazole (PROTONIX) 40 MG tablet Take 40 mg by mouth daily before breakfast.     FAMILY HISTORY:   His family history includes Asthma in his mother.  SOCIAL HISTORY:  He  reports that he has quit smoking. He has never used smokeless tobacco. He reports that he does not drink alcohol or use drugs.  REVIEW OF SYSTEMS:     Not obtainable   Greater than 32 minutes was spent in the care of this patient today.

## 2018-07-26 NOTE — Progress Notes (Signed)
LE venous duplex prelim: negative for DVT. Rakayla Ricklefs Eunice, RDMS, RVT  

## 2018-07-26 NOTE — Consult Note (Signed)
H&P Physician requesting consult: Marolyn Hammock, MD  Chief Complaint: Difficult Foley  History of Present Illness: 62 year old male currently admitted to the ICU with progressive shortness of breath and involuntary weight loss who was intubated in the emergency department.  He is currently on vancomycin and Zosyn and is on contact precautions for possible TB.  Foley catheter attempted to be placed but met resistance.  Despite multiple attempts, it was unable to be placed.  I was therefore consulted.  The patient is unable to provide any information.  Family member was present initially who denied any urological history.  Past Medical History:  Diagnosis Date  . Arthritis   . Diabetes mellitus without complication ([place])   . Glaucoma   . Hypertension    Past Surgical History:  Procedure Laterality Date  . HERNIA REPAIR      Home Medications:  Medications Prior to Admission  Medication Sig Dispense Refill Last Dose  . amLODipine (NORVASC) 10 MG tablet Take 10 mg by mouth daily.    unk  . atorvastatin (LIPITOR) 20 MG tablet Take 20 mg by mouth at bedtime.  3 UNK  . folic acid (FOLVITE) 1 MG tablet Take by mouth.   UNK  . HUMULIN R 100 UNIT/ML injection Inject 5-6 Units into the skin See admin instructions. 5 units in the morning and 6 units at night  1   . hydrochlorothiazide (HYDRODIURIL) 25 MG tablet Take by mouth.   UNK  . insulin NPH Human (HUMULIN N,NOVOLIN N) 100 UNIT/ML injection Inject into the skin.   UNK  . metFORMIN (GLUCOPHAGE) 850 MG tablet Take by mouth.   UNK  . methotrexate (RHEUMATREX) 2.5 MG tablet Take by mouth.   UNK  . Multiple Vitamin (MULTIVITAMIN) tablet Take 1 tablet by mouth daily.   UNK  . pantoprazole (PROTONIX) 40 MG tablet Take 40 mg by mouth daily before breakfast.   2 UNK   Allergies: No Known Allergies  Family History  Problem Relation Age of Onset  . Asthma Mother    Social History:  reports that he has quit smoking. He has never used smokeless  tobacco. He reports that he does not drink alcohol or use drugs.  ROS: A complete review of systems was performed.  All systems are negative except for pertinent findings as noted. ROS   Physical Exam:  Vital signs in last 24 hours: Temp:  [98.9 F (37.2 C)-101.1 F (38.4 C)] 99.5 F (37.5 C) (10/08 1142) Pulse Rate:  [94-120] 96 (10/08 1800) Resp:  [12-16] 15 (10/08 1800) BP: (85-134)/(47-91) 110/78 (10/08 1800) SpO2:  [99 %-100 %] 100 % (10/08 1800) FiO2 (%):  [30 %-40 %] 30 % (10/08 1800) Weight:  [61.6 kg] 61.6 kg (10/08 0430)  Intubated and sedated Patient is uncircumcised.  Testicles are distended bilaterally.  Laboratory Data:  Results for orders placed or performed during the hospital encounter of 07/25/18 (from the past 24 hour(s))  Glucose, capillary     Status: Abnormal   Collection Time: 07/25/18  7:58 PM  Result Value Ref Range   Glucose-Capillary 161 (H) 70 - 99 mg/dL  PSA     Status: None   Collection Time: 07/25/18 10:59 PM  Result Value Ref Range   Prostatic Specific Antigen 1.68 0.00 - 4.00 ng/mL  Glucose, capillary     Status: Abnormal   Collection Time: 07/25/18 11:23 PM  Result Value Ref Range   Glucose-Capillary 155 (H) 70 - 99 mg/dL   Comment 1 Notify RN  Magnesium     Status: None   Collection Time: 07/26/18  1:31 AM  Result Value Ref Range   Magnesium 1.8 1.7 - 2.4 mg/dL  CBC     Status: Abnormal   Collection Time: 07/26/18  1:31 AM  Result Value Ref Range   WBC 5.3 4.0 - 10.5 K/uL   RBC 5.15 4.22 - 5.81 MIL/uL   Hemoglobin 15.4 13.0 - 17.0 g/dL   HCT 45.6 39.0 - 52.0 %   MCV 88.5 80.0 - 100.0 fL   MCH 29.9 26.0 - 34.0 pg   MCHC 33.8 30.0 - 36.0 g/dL   RDW 12.3 11.5 - 15.5 %   Platelets 143 (L) 150 - 400 K/uL  Phosphorus     Status: Abnormal   Collection Time: 07/26/18  1:31 AM  Result Value Ref Range   Phosphorus 1.9 (L) 2.5 - 4.6 mg/dL  Glucose, capillary     Status: Abnormal   Collection Time: 07/26/18  3:45 AM  Result Value Ref  Range   Glucose-Capillary 235 (H) 70 - 99 mg/dL   Comment 1 Notify RN   Blood gas, arterial     Status: Abnormal   Collection Time: 07/26/18  4:45 AM  Result Value Ref Range   FIO2 40.00    Delivery systems VENTILATOR    Mode PRESSURE REGULATED VOLUME CONTROL    VT 600 mL   LHR 16 resp/min   Peep/cpap 5.0 cm H20   pH, Arterial 7.569 (H) 7.350 - 7.450   pCO2 arterial 30.7 (L) 32.0 - 48.0 mmHg   pO2, Arterial 117 (H) 83.0 - 108.0 mmHg   Bicarbonate 28.1 (H) 20.0 - 28.0 mmol/L   Acid-Base Excess 5.6 (H) 0.0 - 2.0 mmol/L   O2 Saturation 98.8 %   Patient temperature 98.6    Collection site LEFT RADIAL    Drawn by [phone]    Sample type ARTERIAL DRAW    Allens test (pass/fail) PASS PASS  Glucose, capillary     Status: Abnormal   Collection Time: 07/26/18  7:35 AM  Result Value Ref Range   Glucose-Capillary 278 (H) 70 - 99 mg/dL   Comment 1 Capillary Specimen   Procalcitonin - Baseline     Status: None   Collection Time: 07/26/18 10:11 AM  Result Value Ref Range   Procalcitonin 0.11 ng/mL  Glucose, capillary     Status: Abnormal   Collection Time: 07/26/18 11:38 AM  Result Value Ref Range   Glucose-Capillary 210 (H) 70 - 99 mg/dL   Comment 1 Capillary Specimen   Glucose, capillary     Status: Abnormal   Collection Time: 07/26/18  3:23 PM  Result Value Ref Range   Glucose-Capillary 249 (H) 70 - 99 mg/dL   Comment 1 Capillary Specimen    Recent Results (from the past 240 hour(s))  MRSA PCR Screening     Status: None   Collection Time: 07/25/18  5:59 PM  Result Value Ref Range Status   MRSA by PCR NEGATIVE NEGATIVE Final    Comment:        The GeneXpert MRSA Assay (FDA approved for NASAL specimens only), is one component of a comprehensive MRSA colonization surveillance program. It is not intended to diagnose MRSA infection nor to guide or monitor treatment for MRSA infections. Performed at [facility], [place] [address], [place], [place] [address]     Creatinine: Recent Labs    07/25/18 1102  CREATININE 0.77   Procedure: Under sterile conditions, I  attempted to place an 29 [place] coud catheter and met resistance at the bulbar urethra indicating a likely stricture.  Therefore I assembled a 55 French flexible cystoscope and advanced that into the urethra and up to the stricture.  I was able to navigate a sensor wire through the stricture and into the bladder.  I then withdrew the scope and sequentially dilated the stricture from 8 [place] up to 20 [place].  I was unable to pass a 16 [place] council tip catheter over the wire and into the bladder followed by removal of the wire.  10 cc of sterile water was instilled into the catheter balloon.  There was return of light blood-tinged urine.  This was placed to bag gravity drainage and this concluded the procedure.  Patient tolerated procedure well.  Impression/Assessment:  Bulbar urethral stricture Difficult Foley catheterization  Plan:  Patient is currently on vancomycin and Zosyn.  This will be good for empiric coverage.  Recommend keeping the catheter for at least 7 days to allow the urethra to heal.  Would not pull the catheter until the patient is extubated and more mobile as well.  Marton Redwood, III 07/26/2018, 6:53 PM

## 2018-07-27 ENCOUNTER — Inpatient Hospital Stay (HOSPITAL_COMMUNITY): Payer: Medicaid Other

## 2018-07-27 DIAGNOSIS — R4182 Altered mental status, unspecified: Secondary | ICD-10-CM

## 2018-07-27 LAB — CBC WITH DIFFERENTIAL/PLATELET
ABS IMMATURE GRANULOCYTES: 0.04 10*3/uL (ref 0.00–0.07)
Basophils Absolute: 0.1 10*3/uL (ref 0.0–0.1)
Basophils Relative: 0 %
Eosinophils Absolute: 0.1 10*3/uL (ref 0.0–0.5)
Eosinophils Relative: 1 %
HCT: 47.5 % (ref 39.0–52.0)
HEMOGLOBIN: 15 g/dL (ref 13.0–17.0)
Immature Granulocytes: 0 %
LYMPHS ABS: 1.7 10*3/uL (ref 0.7–4.0)
LYMPHS PCT: 15 %
MCH: 28.4 pg (ref 26.0–34.0)
MCHC: 31.6 g/dL (ref 30.0–36.0)
MCV: 90 fL (ref 80.0–100.0)
MONO ABS: 0.8 10*3/uL (ref 0.1–1.0)
MONOS PCT: 7 %
Neutro Abs: 8.6 10*3/uL — ABNORMAL HIGH (ref 1.7–7.7)
Neutrophils Relative %: 77 %
Platelets: 120 10*3/uL — ABNORMAL LOW (ref 150–400)
RBC: 5.28 MIL/uL (ref 4.22–5.81)
RDW: 12.9 % (ref 11.5–15.5)
WBC: 11.2 10*3/uL — ABNORMAL HIGH (ref 4.0–10.5)
nRBC: 0 % (ref 0.0–0.2)

## 2018-07-27 LAB — GLUCOSE, CAPILLARY
GLUCOSE-CAPILLARY: 125 mg/dL — AB (ref 70–99)
GLUCOSE-CAPILLARY: 154 mg/dL — AB (ref 70–99)
GLUCOSE-CAPILLARY: 123 mg/dL — AB (ref 70–99)
Glucose-Capillary: 97 mg/dL (ref 70–99)
Glucose-Capillary: 184 mg/dL — ABNORMAL HIGH (ref 70–99)
Glucose-Capillary: 123 mg/dL — ABNORMAL HIGH (ref 70–99)
Glucose-Capillary: 120 mg/dL — ABNORMAL HIGH (ref 70–99)

## 2018-07-27 LAB — BASIC METABOLIC PANEL
Anion gap: 13 (ref 5–15)
BUN: 13 mg/dL (ref 8–23)
CALCIUM: 8.7 mg/dL — AB (ref 8.9–10.3)
CHLORIDE: 97 mmol/L — AB (ref 98–111)
CO2: 27 mmol/L (ref 22–32)
CREATININE: 0.91 mg/dL (ref 0.61–1.24)
GFR calc Af Amer: >60 mL/min (ref >60)
Glucose, Bld: 108 mg/dL — ABNORMAL HIGH (ref 70–99)
POTASSIUM: 3.4 mmol/L — AB (ref 3.5–5.1)
SODIUM: 137 mmol/L (ref 135–145)

## 2018-07-27 LAB — PROCALCITONIN: Procalcitonin: 0.3 ng/mL

## 2018-07-27 LAB — ACID FAST SMEAR (AFB, MYCOBACTERIA): Acid Fast Smear: NEGATIVE

## 2018-07-27 LAB — RAPID URINE DRUG SCREEN, HOSP PERFORMED
AMPHETAMINES: NOT DETECTED
BARBITURATES: NOT DETECTED
BENZODIAZEPINES: NOT DETECTED
Cocaine: NOT DETECTED
Opiates: NOT DETECTED
TETRAHYDROCANNABINOL: NOT DETECTED

## 2018-07-27 LAB — TSH: TSH: 5.053 u[IU]/mL — ABNORMAL HIGH (ref 0.350–4.500)

## 2018-07-27 LAB — ACID FAST SMEAR (AFB)

## 2018-07-27 LAB — T4, FREE: Free T4: 1.32 ng/dL (ref 0.82–1.77)

## 2018-07-27 LAB — PHOSPHORUS: PHOSPHORUS: 3.8 mg/dL (ref 2.5–4.6)

## 2018-07-27 MED ORDER — POTASSIUM CHLORIDE CRYS ER 20 MEQ PO TBCR
20.0000 meq | EXTENDED_RELEASE_TABLET | Freq: Once | ORAL | Status: AC
  Administered 2018-07-27: 20 meq via ORAL
  Filled 2018-07-27: qty 1

## 2018-07-27 MED ORDER — MIDAZOLAM HCL 2 MG/2ML IJ SOLN
INTRAMUSCULAR | Status: AC
  Administered 2018-07-27: 2 mg
  Filled 2018-07-27: qty 2

## 2018-07-27 MED ORDER — LABETALOL HCL 5 MG/ML IV SOLN
10.0000 mg | Freq: Four times a day (QID) | INTRAVENOUS | Status: DC | PRN
  Administered 2018-07-27 – 2018-08-13 (×4): 10 mg via INTRAVENOUS
  Filled 2018-07-27 (×5): qty 4

## 2018-07-27 MED ORDER — MIDAZOLAM HCL 2 MG/2ML IJ SOLN: 2.0000 mg | Freq: Once | INTRAMUSCULAR | Status: AC

## 2018-07-27 MED ORDER — ORAL CARE MOUTH RINSE: 15.0000 mL | Freq: Two times a day (BID) | OROMUCOSAL | Status: DC

## 2018-07-27 MED ORDER — ORAL CARE MOUTH RINSE
15.0000 mL | OROMUCOSAL | Status: DC
  Administered 2018-07-27 – 2018-07-29 (×17): 15 mL via OROMUCOSAL

## 2018-07-27 MED ORDER — CHLORHEXIDINE GLUCONATE 0.12% ORAL RINSE (MEDLINE KIT)
15.0000 mL | Freq: Two times a day (BID) | OROMUCOSAL | Status: DC
  Administered 2018-07-27 – 2018-07-29 (×4): 15 mL via OROMUCOSAL

## 2018-07-27 MED ORDER — ROCURONIUM BROMIDE 50 MG/5ML IV SOLN
60.0000 mg | Freq: Once | INTRAVENOUS | Status: AC
  Administered 2018-07-27: 60 mg via INTRAVENOUS

## 2018-07-27 MED ORDER — HYDROMORPHONE HCL 1 MG/ML IJ SOLN
0.5000 mg | Freq: Once | INTRAMUSCULAR | Status: AC
  Administered 2018-07-27: 0.5 mg via INTRAVENOUS
  Filled 2018-07-27: qty 0.5

## 2018-07-27 MED ORDER — ETOMIDATE 2 MG/ML IV SOLN
20.0000 mg | Freq: Once | INTRAVENOUS | Status: AC
  Administered 2018-07-27: 20 mg via INTRAVENOUS

## 2018-07-27 MED ORDER — METOPROLOL TARTRATE 5 MG/5ML IV SOLN
2.5000 mg | Freq: Once | INTRAVENOUS | Status: AC
  Administered 2018-07-27: 2.5 mg via INTRAVENOUS
  Filled 2018-07-27: qty 5

## 2018-07-27 MED ORDER — SODIUM CHLORIDE 0.9 % IV BOLUS
500.0000 mL | Freq: Once | INTRAVENOUS | Status: AC
  Administered 2018-07-27: 500 mL via INTRAVENOUS

## 2018-07-27 MED ORDER — MIDAZOLAM HCL 2 MG/2ML IJ SOLN
2.0000 mg | Freq: Once | INTRAMUSCULAR | Status: AC
  Administered 2018-07-28: 2 mg via INTRAVENOUS
  Filled 2018-07-27: qty 2

## 2018-07-27 MED ORDER — MIDAZOLAM HCL 2 MG/2ML IJ SOLN: 2.0000 mg | Freq: Once | INTRAMUSCULAR | Status: DC

## 2018-07-27 MED ORDER — LEVETIRACETAM IN NACL 1000 MG/100ML IV SOLN
1000.0000 mg | Freq: Two times a day (BID) | INTRAVENOUS | Status: DC
  Administered 2018-07-27 – 2018-08-12 (×32): 1000 mg via INTRAVENOUS
  Filled 2018-07-27 (×35): qty 100

## 2018-07-27 MED ORDER — IOPAMIDOL (ISOVUE-370) INJECTION 76%
INTRAVENOUS | Status: AC
  Administered 2018-07-27: 100 mL
  Filled 2018-07-27: qty 100

## 2018-07-27 MED ORDER — VANCOMYCIN HCL IN DEXTROSE 750-5 MG/150ML-% IV SOLN: 750.0000 mg | Freq: Two times a day (BID) | INTRAVENOUS | Status: DC

## 2018-07-27 NOTE — Progress Notes (Signed)
Pharmacy Antibiotic Note  Sarkis Rhines is a 62 y.o. male admitted on 07/25/2018 with fever.  Pharmacy has been consulted for vancomycin and Zosyn dosing.   Patient is extubated but remains febrile.  WBC up to 11.2, PCT up to 0.3.  SCr is also trending up.   Plan: Change vanc to 750mg  IV Q12H Continue Zosyn EID 3.375gm IV Q8H Monitor renal fxn, clinical progress, vanc trough soon   Height: 5\' 11"  (180.3 cm) Weight: 138 lb 10.7 oz (62.9 kg) IBW/kg (Calculated) : 75.3  Temp (24hrs), Avg:100 F (37.8 C), Min:98.7 F (37.1 C), Max:101.6 F (38.7 C)  Recent Labs  Lab 07/25/18 1102 07/26/18 0131 07/27/18 0355  WBC 4.4 5.3 11.2*  CREATININE 0.77  --  0.91    Estimated Creatinine Clearance: 73 mL/min (by C-G formula based on SCr of 0.91 mg/dL).    No Known Allergies   Vanc 10/8 >> Zosyn 10/8 >>  10/7 MRSA PCR - negative 10/8 BCx -  10/8 AFB -  10/8 UCx -    Lorely Bubb D. 12/8, PharmD, BCPS, BCCCP 07/27/2018, 11:09 AM

## 2018-07-27 NOTE — Procedures (Signed)
Extubation Procedure Note  Patient Details:   Name: Wayne Young DOB: 10/13/1956 MRN: [id number]   Airway Documentation:    Vent end date: 07/27/18 Vent end time: 1005   Evaluation  O2 sats: stable throughout Complications: No apparent complications Patient did tolerate procedure well. Bilateral Breath Sounds: Clear, Diminished   Yes   RT extubated patient with RN at bedside per MD order. Patient is sating 99% on 2L [address]. Positive cuff leak noted. Patient states that his breathing is comfortable. No stridor noted. RT will continue to monitor.   Lura Em 07/27/2018, 10:10 AM

## 2018-07-27 NOTE — Progress Notes (Signed)
PULMONARY / CRITICAL CARE MEDICINE   NAME:  Wayne Young, MRN:  [id number], DOB:  1956/04/28, LOS: 2 ADMISSION DATE:  07/25/2018,   CHIEF COMPLAINT: Dyspnea and declining mental status.  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  He was intubated in the department of emergency medicine HISTORY OF PRESENT ILLNESS   In addition to the above he is known to be HIV negative. SIGNIFICANT PAST MEDICAL HISTORY    Rheumatoid arthritis, positive PPD. SIGNIFICANT EVENTS:   STUDIES:    CULTURES:  No positive cultures to date  ANTIBIOTICS:  Zosyn and vancomycin added 10/7  LINES/TUBES:   No lines CONSULTANTS:   SUBJECTIVE:      He was febrile yesterday and unable to pass urine.  We were unable to place a Foley which was subsequently placed by urology yesterday evening.  This morning he is wide awake and denying dyspnea on CPAP.  CONSTITUTIONAL: BP (!) 144/86   Pulse (!) 120   Temp 98.7 F (37.1 C) (Oral)   Resp 20   Ht 5\' 11"  (1.803 m)   Wt 62.9 kg   SpO2 96%   BMI 19.34 kg/m   I/O last 3 completed shifts: In: 4162.2 [I.V.:2000.7; IV Piggyback:2161.5] Out: 640 [Urine:640]     Vent Mode: CPAP;PSV FiO2 (%):  [30 %] 30 % Set Rate:  [12 bmp] 12 bmp Vt Set:  [600 mL] 600 mL PEEP:  [5 cmH20] 5 cmH20 Pressure Support:  [8 cmH20] 8 cmH20 Plateau Pressure:  [12 cmH20-14 cmH20] 13 cmH20  PHYSICAL EXAM: General: Cachectic elderly male who is breathing comfortably on CPAP and in no obvious distress.   Neuro: He is nodding appropriately to questions and moving all fours  HENT: No JVD Cardiovascular: S1 and S2 are somewhat rapid and regular without murmur rub or gallop  Lungs: Respirations are unlabored on pressure support 8.  There is  symmetric air movement and no wheezes.   Abdomen: The abdomen is scaphoid without organomegaly masses tenderness guarding or rebound  Musculoskeletal: No edema   RESOLVED PROBLEM LIST   ASSESSMENT AND PLAN   1.  Dyspnea.  No evidence on chest x-ray of new pulmonary pathology.  D-dimer is markedly positive but Doppler of the lower extremities is negative.  Unfortunately we have no good explanation for his acute respiratory deterioration and I think in light of his baseline tachycardia it would be prudent to obtain a CTA which I have ordered.  He is currently operating CPAP nicely and I am anticipating extubation in the next several minutes  2.  Weight loss.  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  I suspect the AFBs I have ordered are superfluous at this point.  CT scan of the chest abdomen and pelvis is really not remarkable except for an enlarged prostate and his PSA is normal.  His wife does state that his food "goes right through him," and we may need to work him up for malabsorption state if no other significant findings reveal themselves.  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  I have ordered a urine tox screen is some of the agents will remain positive for a substantial period of time.  Once the patient is extubated we can further review his history 3.  Fever.  He has been broadly covered with antibiotics and I am most suspicious of urinary tract infection as the provocation.  SUMMARY OF TODAY'S PLAN:    Best Practice / Goals of Care / Disposition.   DVT PROPHYLAXIS: Lovenox SUP: Pepcid NUTRITION: I am anticipating extubation in the very near future and p.o. intake MOBILITY  LABS  Glucose Recent Labs  Lab 07/26/18 1138 07/26/18 1523  07/26/18 1934 07/26/18 2347 07/27/18 0355 07/27/18 0718  GLUCAP 210* 249* 181* 81 97 125*    BMET Recent Labs  Lab 07/25/18 1102 07/27/18 0355  NA 137 137  K 3.6 3.4*  CL 95* 97*  CO2 30 27  BUN 8 13  CREATININE 0.77 0.91  GLUCOSE 184* 108*    Liver Enzymes Recent Labs  Lab 07/25/18 1102  AST 21  ALT 19  ALKPHOS 46  BILITOT 1.2  ALBUMIN 3.9    Electrolytes Recent Labs  Lab 07/25/18 1102 07/26/18 0131 07/27/18 0355  CALCIUM 9.5  --  8.7*  MG  --  1.8  --   PHOS  --  1.9* 3.8    CBC Recent Labs  Lab 07/25/18 1102 07/26/18 0131 07/27/18 0355  WBC 4.4 5.3 11.2*  HGB 17.6* 15.4 15.0  HCT 52.4* 45.6 47.5  PLT 216 143* 120*    ABG Recent Labs  Lab 07/25/18 1630 07/26/18 0445  PHART 7.453* 7.569*  PCO2ART 52.2* 30.7*  PO2ART 499.0* 117*    Coag's No results for input(s): APTT, INR in the last 168 hours.  Sepsis Markers Recent Labs  Lab 07/26/18 1011 07/27/18 0355  PROCALCITON 0.11 0.30    Cardiac Enzymes No results for input(s): TROPONINI, PROBNP in the last 168 hours.  PAST MEDICAL HISTORY :   He  has a past medical history of Arthritis, Diabetes mellitus without complication (HCC), Glaucoma, and Hypertension.  PAST SURGICAL HISTORY:  He  has a past surgical history that includes Hernia repair.  No Known Allergies  No current facility-administered medications on file prior to encounter.    Current Outpatient Medications on File Prior to Encounter  Medication Sig  . amLODipine (NORVASC) 10 MG tablet Take 10 mg by mouth daily.   Marland Kitchen atorvastatin (LIPITOR) 20 MG tablet Take 20 mg by mouth at bedtime.  . folic acid (FOLVITE) 1 MG tablet Take by mouth.  Marland Kitchen HUMULIN R 100 UNIT/ML injection Inject 5-6 Units into the skin See admin instructions. 5 units in the morning and 6 units at night  . hydrochlorothiazide (HYDRODIURIL) 25 MG tablet Take by mouth.  . insulin NPH Human (HUMULIN N,NOVOLIN N) 100 UNIT/ML injection Inject into the skin.   . metFORMIN (GLUCOPHAGE) 850 MG tablet Take by mouth.  . methotrexate (RHEUMATREX) 2.5 MG tablet Take by mouth.  . Multiple Vitamin (MULTIVITAMIN) tablet Take 1 tablet by mouth daily.  . pantoprazole (PROTONIX) 40 MG tablet Take 40 mg by mouth daily before breakfast.     FAMILY HISTORY:   His family history includes Asthma in his mother.  SOCIAL HISTORY:  He  reports that he has quit smoking. He has never used smokeless tobacco. He reports that he does not drink alcohol or use drugs.  REVIEW OF SYSTEMS:  Not obtainable   Greater than 32 minutes was spent in the care of this patient today.

## 2018-07-27 NOTE — Progress Notes (Signed)
RN attempted to arouse patient with voice, touch, and pain. He eventually opened eyes but would not track or follow commands. He appeared to be agitated and to be in respiratory distress. Pupils were equal and reactive X2 but sluggish. MD paged to bedside and new orders placed.   Family at bedside and updated.

## 2018-07-27 NOTE — Procedures (Signed)
Endotracheal intubation  Indication: The patient was sitting in a chair when he suddenly became unresponsive with tonic eye deviation.  There is no overt tonic-clonic activity.  He was obviously not ventilating adequately and he was transferred to the bed in semi-emergently intubated by me.  He was preoxygenated and given 2 mg of Versed followed by 20 mg of etomidate and then 60 mg of rocuronium.  The cords were easily visualized using a 4 Miller blade and an 8 endotracheal tube was passed to 22 cm at the lip.  There was positive CO2 and symmetric air movement.  Chest x-ray shows good tube placement

## 2018-07-27 NOTE — Progress Notes (Signed)
2100: MRI called about STAT MRI Head ordered. Was informef they had 2 pts ahead of him and will call when they are available.   2310: MRI called, will schedule for 0100. RT aware.   0200: Pt back from MRI. VSS, no acute distress noted.

## 2018-07-27 NOTE — Progress Notes (Signed)
15 ml of Fentanyl wasted in sink, witnessed by [employer], Charity fundraiser.

## 2018-07-28 ENCOUNTER — Inpatient Hospital Stay (HOSPITAL_COMMUNITY): Payer: Medicaid Other

## 2018-07-28 ENCOUNTER — Inpatient Hospital Stay
Admission: RE | Admit: 2018-07-28 | Discharge: 2018-07-28 | Disposition: A | Discharge status: Home / Self Care | Payer: Medicaid Other | Source: Ambulatory Visit | Attending: Gastroenterology | Admitting: Gastroenterology

## 2018-07-28 DIAGNOSIS — E43 Unspecified severe protein-calorie malnutrition: Secondary | ICD-10-CM

## 2018-07-28 DIAGNOSIS — J9602 Acute respiratory failure with hypercapnia: Secondary | ICD-10-CM

## 2018-07-28 DIAGNOSIS — R634 Abnormal weight loss: Secondary | ICD-10-CM

## 2018-07-28 LAB — BASIC METABOLIC PANEL
Anion gap: 7 (ref 5–15)
BUN: 9 mg/dL (ref 8–23)
CALCIUM: 8.4 mg/dL — AB (ref 8.9–10.3)
CO2: 28 mmol/L (ref 22–32)
CREATININE: 0.92 mg/dL (ref 0.61–1.24)
Chloride: 108 mmol/L (ref 98–111)
Glucose, Bld: 84 mg/dL (ref 70–99)
Potassium: 3.3 mmol/L — ABNORMAL LOW (ref 3.5–5.1)
SODIUM: 143 mmol/L (ref 135–145)

## 2018-07-28 LAB — GLUCOSE, CAPILLARY
GLUCOSE-CAPILLARY: 67 mg/dL — AB (ref 70–99)
GLUCOSE-CAPILLARY: 87 mg/dL (ref 70–99)
GLUCOSE-CAPILLARY: 161 mg/dL — AB (ref 70–99)
Glucose-Capillary: 74 mg/dL (ref 70–99)
Glucose-Capillary: 116 mg/dL — ABNORMAL HIGH (ref 70–99)
Glucose-Capillary: 105 mg/dL — ABNORMAL HIGH (ref 70–99)

## 2018-07-28 LAB — CBC WITH DIFFERENTIAL/PLATELET
Abs Immature Granulocytes: 0.04 10*3/uL (ref 0.00–0.07)
BASOS ABS: 0 10*3/uL (ref 0.0–0.1)
BASOS PCT: 0 %
EOS ABS: 0.2 10*3/uL (ref 0.0–0.5)
Eosinophils Relative: 3 %
HCT: 41.2 % (ref 39.0–52.0)
HEMOGLOBIN: 13 g/dL (ref 13.0–17.0)
Immature Granulocytes: 1 %
LYMPHS PCT: 12 %
Lymphs Abs: 0.9 10*3/uL (ref 0.7–4.0)
MCH: 28.6 pg (ref 26.0–34.0)
MCHC: 31.6 g/dL (ref 30.0–36.0)
MCV: 90.5 fL (ref 80.0–100.0)
Monocytes Absolute: 0.6 10*3/uL (ref 0.1–1.0)
Monocytes Relative: 8 %
NRBC: 0 % (ref 0.0–0.2)
Neutro Abs: 5.7 10*3/uL (ref 1.7–7.7)
Neutrophils Relative %: 76 %
PLATELETS: 112 10*3/uL — AB (ref 150–400)
RBC: 4.55 MIL/uL (ref 4.22–5.81)
RDW: 13 % (ref 11.5–15.5)
WBC: 7 10*3/uL (ref 4.0–10.5)

## 2018-07-28 LAB — PROCALCITONIN: PROCALCITONIN: 0.44 ng/mL

## 2018-07-28 LAB — PHOSPHORUS
Phosphorus: 1.8 mg/dL — ABNORMAL LOW (ref 2.5–4.6)
Phosphorus: 1.7 mg/dL — ABNORMAL LOW (ref 2.5–4.6)

## 2018-07-28 LAB — URINE CULTURE: Culture: NO GROWTH

## 2018-07-28 LAB — MAGNESIUM
Magnesium: 1.8 mg/dL (ref 1.7–2.4)
Magnesium: 2 mg/dL (ref 1.7–2.4)

## 2018-07-28 MED ORDER — POTASSIUM PHOSPHATE MONOBASIC 500 MG PO TABS
500.0000 mg | ORAL_TABLET | Freq: Three times a day (TID) | ORAL | Status: DC
  Filled 2018-07-28: qty 1

## 2018-07-28 MED ORDER — GADOBUTROL 1 MMOL/ML IV SOLN
7.5000 mL | Freq: Once | INTRAVENOUS | Status: AC | PRN
  Administered 2018-07-28: 6 mL via INTRAVENOUS

## 2018-07-28 MED ORDER — VITAL AF 1.2 CAL PO LIQD
1000.0000 mL | ORAL | Status: DC
  Administered 2018-07-28: 1000 mL

## 2018-07-28 MED ORDER — DEXTROSE 50 % IV SOLN
INTRAVENOUS | Status: AC
  Administered 2018-07-28: 25 mL via INTRAVENOUS
  Filled 2018-07-28: qty 50

## 2018-07-28 MED ORDER — K PHOS MONO-SOD PHOS DI & MONO 155-852-130 MG PO TABS
500.0000 mg | ORAL_TABLET | Freq: Three times a day (TID) | ORAL | Status: AC
  Administered 2018-07-28: 500 mg via ORAL
  Filled 2018-07-28: qty 2

## 2018-07-28 MED ORDER — POTASSIUM CHLORIDE 20 MEQ/15ML (10%) PO SOLN
20.0000 meq | ORAL | Status: AC
  Administered 2018-07-28 (×2): 20 meq
  Filled 2018-07-28 (×2): qty 15

## 2018-07-28 MED ORDER — DEXTROSE 50 % IV SOLN
25.0000 mL | Freq: Once | INTRAVENOUS | Status: AC
  Administered 2018-07-28: 25 mL via INTRAVENOUS

## 2018-07-28 NOTE — Procedures (Signed)
ELECTROENCEPHALOGRAM REPORT   Patient: Wayne Young       Room #: 2M10C EEG No. ID: [id number] Age: 62 y.o.        Sex: male Referring Physician: Byrum Report Date:  07/28/2018        Interpreting Physician: Thana Farr  History: Daiveon Markman is an 62 y.o. male with declining mental status  Medications:  Pepcid, Insulin, Keppra, Fentanyl  Conditions of Recording:  This is a 21 channel routine scalp EEG performed with bipolar and monopolar montages arranged in accordance to the international 10/20 system of electrode placement. One channel was dedicated to EKG recording.  The patient is in the intubated and sedated state.  Description:  The patient does achieve wakefulness during the recording.  During wakefulness a maximum posterior background rhythm of 8 Hz alpha is achieved but is poorly sustained and the more predominant posterior background rhythm is that of a 7 Hz theta.  Low voltage fast activity, poorly organized, is seen anteriorly and is at times superimposed on more posterior regions.  A mixture of poorly organized theta and alpha rhythms are seen from the central and temporal regions. The patient drowses with slowing to irregular, low voltage theta and beta activity.   Stage II sleep is not obtained. No epileptiform activity is noted.   Hyperventilation and intermittent photic stimulation were not performed.  IMPRESSION: Normal electroencephalogram, awake and drowsy. There are no focal lateralizing or epileptiform features.   Thana Farr, MD Neurology [phone] 07/28/2018, 3:21 PM

## 2018-07-28 NOTE — Progress Notes (Signed)
PULMONARY / CRITICAL CARE MEDICINE   NAME:  Wayne Young, MRN:  [id number], DOB:  [birth year], LOS: 3 ADMISSION DATE:  07/25/2018,   CHIEF COMPLAINT: Dyspnea and declining mental status.  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  He was intubated in the department of emergency medicine HISTORY OF PRESENT ILLNESS   In addition to the above he is known to be HIV negative. SIGNIFICANT PAST MEDICAL HISTORY    Rheumatoid arthritis, positive PPD. SIGNIFICANT EVENTS:   STUDIES:    CULTURES:  No positive cultures to date  ANTIBIOTICS:  Zosyn and vancomycin added 10/7  LINES/TUBES:   No lines CONSULTANTS:   SUBJECTIVE:  Overnight, patient had an unresponsive episode in which patient was intubated for airway protection. Discussed with Dr. Wallace Cullens, who said pt did not have any body movement consistant with tonic-clonic motion, but did have "tonic-clonic" eye movement. Patient seemed to have resolution. Work up with assessment and plan described below.   CONSTITUTIONAL: BP 109/76   Pulse 95   Temp 98.3 F (36.8 C) (Oral)   Resp 13   Ht 5\' 11"  (1.803 m)   Wt 59.4 kg   SpO2 94%   BMI 18.26 kg/m   I/O last 3 completed shifts: In: 2884.4 [I.V.:1118.4; IV Piggyback:1766] Out: 2288 [Urine:2288]     Vent Mode: PRVC FiO2 (%):  [30 %-40 %] 40 % Set Rate:  [12 bmp] 12 bmp Vt Set:  [600 mL] 600 mL PEEP:  [5 cmH20] 5 cmH20 Plateau Pressure:  [14 cmH20-19 cmH20] 14 cmH20  PHYSICAL EXAM: General: on ventilator, alert, NAD Neuro: alert, follows commands, PERRL, moves all extremities without obvious deficient HENT: No JVD Cardiovascular: rrr, no mrg Lungs: ETT in place, no wheezes heard on exam Abdomen: soft, nontender,  nondistended Musculoskeletal: No edema  RESOLVED PROBLEM LIST   ASSESSMENT AND PLAN   AMS. Improved. Unclear etiology. MRI normal, pt had normal glucose. It resolved relatively quickly without obvious deficits. Pt has no known history of seizures. HIV negative. Recent Quant Gold 06/2018 negative, AFB during this admission negative. CBG was wnl. UDS negative.No evidence on malignancy on MR head, CT chest/ab/pelvis. EEG ordered, but not yet performed. Curb-side consult with neurology. They agreeded with EEG. If negative, consider LP for occult infection. They did not feel that they would add anything to the work up at this time. If LP negative. They said they were happy to reconsulted.  - EEG - LP if EEG negative  Respiratory Distress Intiallly presented with concerns for pulmonary infections, but work up thus far is negative. Reintubated last night, for airway protection due to unresponsive event. AMS has resolved and from a respiratory perspecitve is doing well.  - wean from as tolerated - Vent support  Weight loss. Per patient ~50 lbs. 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 Pan imaging has not shown mass suspicious for tumor. HIV negative. Possible may have CNS infection, or malabsorbtive issues.  - monitor fever curve - follow blood, urine cultures - consider narrowing abx  SUMMARY OF TODAY'S PLAN:  Wean from Vent. EEG. Possible LP  Best Practice / Goals of Care / Disposition.   DVT PROPHYLAXIS: Lovenox SUP: Pepcid NUTRITION: Tube feeds MOBILITY: bed rest  LABS  Glucose Recent Labs  Lab 07/27/18 2225 07/27/18 2329 07/28/18 0410 07/28/18 0751 07/28/18 0828 07/28/18 1141  GLUCAP 123* 120* 74 67* 116* 87    BMET Recent Labs  Lab 07/25/18 1102 07/27/18 0355 07/28/18 0341  NA 137 137 143  K 3.6 3.4* 3.3*  CL 95* 97* 108  CO2 30 27 28   BUN 8 13 9   CREATININE 0.77 0.91 0.92  GLUCOSE 184* 108* 84    Liver Enzymes Recent Labs  Lab 07/25/18 1102  AST  21  ALT 19  ALKPHOS 46  BILITOT 1.2  ALBUMIN 3.9    Electrolytes Recent Labs  Lab 07/25/18 1102 07/26/18 0131 07/27/18 0355 07/28/18 0341  CALCIUM 9.5  --  8.7* 8.4*  MG  --  1.8  --   --   PHOS  --  1.9* 3.8  --     CBC Recent Labs  Lab 07/26/18 0131 07/27/18 0355 07/28/18 0341  WBC 5.3 11.2* 7.0  HGB 15.4 15.0 13.0  HCT 45.6 47.5 41.2  PLT 143* 120* 112*    ABG Recent Labs  Lab 07/25/18 1630 07/26/18 0445  PHART 7.453* 7.569*  PCO2ART 52.2* 30.7*  PO2ART 499.0* 117*    Coag's No results for input(s): APTT, INR in the last 168 hours.  Sepsis Markers Recent Labs  Lab 07/26/18 1011 07/27/18 0355 07/28/18 0341  PROCALCITON 0.11 0.30 0.44    Cardiac Enzymes No results for input(s): TROPONINI, PROBNP in the last 168 hours.  PAST MEDICAL HISTORY :   He  has a past medical history of Arthritis, Diabetes mellitus without complication (HCC), Glaucoma, and Hypertension.  PAST SURGICAL HISTORY:  He  has a past surgical history that includes Hernia repair.  No Known Allergies  No current facility-administered medications on file prior to encounter.    Current Outpatient Medications on File Prior to Encounter  Medication Sig  . amLODipine (NORVASC) 10 MG tablet Take 10 mg by mouth daily.   09/26/18 atorvastatin (LIPITOR) 20 MG tablet Take 20 mg by mouth at bedtime.  . folic acid (FOLVITE) 1 MG tablet Take by mouth.  09/27/18 HUMULIN R 100 UNIT/ML injection Inject 5-6 Units into the skin See admin instructions. 5 units in the morning and 6 units at night  . hydrochlorothiazide (HYDRODIURIL) 25 MG tablet Take by mouth.  . insulin NPH Human (HUMULIN N,NOVOLIN N) 100 UNIT/ML injection Inject into the skin.  . metFORMIN (GLUCOPHAGE) 850 MG tablet Take by mouth.  . methotrexate (RHEUMATREX) 2.5 MG tablet Take by mouth.  . Multiple Vitamin (MULTIVITAMIN) tablet Take 1 tablet by mouth daily.  . pantoprazole (PROTONIX) 40 MG tablet Take 40 mg by mouth daily before breakfast.      FAMILY HISTORY:   His family history includes Asthma in his mother.  SOCIAL HISTORY:  He  reports that he has quit smoking. He has never used smokeless tobacco. He reports that he does not drink alcohol or use drugs.  REVIEW OF SYSTEMS:     Not obtainable   Marland Kitchen, MD, MS FAMILY MEDICINE RESIDENT - PGY2 07/28/2018 12:03 PM

## 2018-07-28 NOTE — Progress Notes (Signed)
[facility] ADULT ICU REPLACEMENT PROTOCOL FOR AM LAB REPLACEMENT ONLY  The patient does apply for the [facility] Adult ICU Electrolyte Replacment Protocol based on the criteria listed below:   1. Is GFR >/= 40 ml/min? Yes.    Patient's GFR today is >60 2. Is urine output >/= 0.5 ml/kg/hr for the last 6 hours? Yes.   Patient's UOP is 200 ml/kg/hr 3. Is BUN < 60 mg/dL? yes  Patient's BUN today is94. Abnormal electrolyte(s): Potassium=3.3 5. Ordered repletion with:potassium x2 6. If a panic level lab has been reported, has the CCM MD in charge been notified? No..   Physician:  Dr. Orma Render, Vernadette Stutsman [place] 07/28/2018 5:34 AM

## 2018-07-28 NOTE — Progress Notes (Signed)
EEG Completed; Results Pending  

## 2018-07-28 NOTE — Plan of Care (Signed)
  Problem: Education: Goal: Knowledge of General Education information will improve Description Including pain rating scale, medication(s)/side effects and non-pharmacologic comfort measures Outcome: Progressing   Problem: Health Behavior/Discharge Planning: Goal: Ability to manage health-related needs will improve Outcome: Progressing   Problem: Clinical Measurements: Goal: Ability to maintain clinical measurements within normal limits will improve Outcome: Progressing Goal: Will remain free from infection Outcome: Progressing Goal: Diagnostic test results will improve Outcome: Progressing Goal: Respiratory complications will improve Outcome: Progressing Goal: Cardiovascular complication will be avoided Outcome: Progressing Note:  Sinus tachycardia   Problem: Activity: Goal: Risk for activity intolerance will decrease Outcome: Progressing Note:  Able to assist with movement in bed   Problem: Nutrition: Goal: Adequate nutrition will be maintained Outcome: Progressing Note:  Tube feedings at this time, pt has appetite.   Problem: Coping: Goal: Level of anxiety will decrease Outcome: Progressing   Problem: Elimination: Goal: Will not experience complications related to bowel motility Outcome: Progressing Note:  Last BM prior to admission, tube feedings started 10/10.    Problem: Pain Managment: Goal: General experience of comfort will improve Outcome: Progressing Note:  Denies pain, on fentanyl gtt.   Problem: Safety: Goal: Ability to remain free from injury will improve Outcome: Progressing   Problem: Skin Integrity: Goal: Risk for impaired skin integrity will decrease Outcome: Progressing   Problem: Activity: Goal: Ability to tolerate increased activity will improve Outcome: Progressing   Problem: Respiratory: Goal: Ability to maintain a clear airway and adequate ventilation will improve Outcome: Progressing Note:  Large amount of clear, thin oral  secretions, pt suctions mouth with yankaeur.    Problem: Role Relationship: Goal: Method of communication will improve Outcome: Progressing   Problem: Elimination: Goal: Will not experience complications related to urinary retention Outcome: Not Progressing Note:  Foley in pace, placed by urology

## 2018-07-28 NOTE — Progress Notes (Signed)
Initial Nutrition Assessment  DOCUMENTATION CODES:   Severe malnutrition in context of acute illness/chronic illness, Underweight  INTERVENTION:    Vital AF 1.2 at 55 ml/h (1320 ml per day)   Provides 1584 kcal, 99 gm protein, 1071 ml free water daily   Monitor magnesium, potassium, and phosphorus daily for at least 3 days, MD to replete as needed, as pt is at risk for refeeding syndrome given severe PCM.  NUTRITION DIAGNOSIS:   Severe Malnutrition related to (acute vs chronic illness (work-up underway)) as evidenced by moderate fat depletion, severe muscle depletion, moderate muscle depletion, percent weight loss(27% weight loss within 3 months).  GOAL:   Patient will meet greater than or equal to 90% of their needs  MONITOR:   Vent status, TF tolerance, Labs, I & O's  REASON FOR ASSESSMENT:   Ventilator, Consult Enteral/tube feeding initiation and management  ASSESSMENT:   62 yo male with PMH of DM, rheumatoid arthritis, HTN, positive PPD, and glaucoma who was admitted on 10/7 with SOB and involuntary weight loss. Intubated 10/7, extubated 10/9, but required re-intubation later that day.   Patient awake on ventilator; he was able to communicate by writing on paper. He reports usual weight 178 lbs 6-7 weeks ago. He was shocked to know he weighs 130 lbs now. Wife in room with patient reports he has had a very poor appetite for the past few weeks and has been eating and drinking very little.   Medical work-up is ongoing. No active signs of TB currently. MRI head and CT chest/abd/pelvis were WNL. HIV negative. Plans for EEG, LP if EEG is negative. Concern for CNS infection or malabsorption.   Received MD Consult for TF initiation and management.  Patient is currently intubated on ventilator support MV: 6.9 L/min Temp (24hrs), Avg:98.7 F (37.1 C), Min:97.8 F (36.6 C), Max:99.7 F (37.6 C)   Labs reviewed. Potassium 3.3 (L) CBG's: [id number] Medications reviewed  and include Novolog, Lantus.  27% weight loss within the past 3 months is significant for the time frame.   NUTRITION - FOCUSED PHYSICAL EXAM:    Most Recent Value  Orbital Region  Moderate depletion  Upper Arm Region  Moderate depletion  Thoracic and Lumbar Region  Mild depletion  Buccal Region  Moderate depletion  Temple Region  Moderate depletion  Clavicle Bone Region  Severe depletion  Clavicle and Acromion Bone Region  Severe depletion  Scapular Bone Region  Unable to assess  Dorsal Hand  Mild depletion  Patellar Region  Mild depletion  Anterior Thigh Region  Mild depletion  Posterior Calf Region  Mild depletion  Edema (RD Assessment)  None  Hair  Reviewed  Eyes  Reviewed  Mouth  Unable to assess  Skin  Reviewed  Nails  Reviewed       Diet Order:   Diet Order            Diet clear liquid Room service appropriate? Yes; Fluid consistency: Thin  Diet effective now              EDUCATION NEEDS:   No education needs have been identified at this time  Skin:  Skin Assessment: Reviewed RN Assessment  Last BM:  10/7  Height:   Ht Readings from Last 1 Encounters:  07/25/18 5\' 11"  (1.803 m)    Weight:   Wt Readings from Last 1 Encounters:  07/28/18 59.4 kg    Ideal Body Weight:  78.2 kg  BMI:  Body mass index is 18.26 kg/m.  Estimated Nutritional Needs:   Kcal:  1635  Protein:  90-100 gm  Fluid:  1.8 - 2 L    Joaquin Courts, RD, LDN, CNSC Pager [phone] After Hours Pager [phone]

## 2018-07-28 NOTE — Progress Notes (Signed)
Hypoglycemic Event  CBG: 67  Treatment: D50 IV 25 mL  Symptoms: None  Follow-up CBG: Time: 815 CBG Result: 116  Possible Reasons for Event: Inadequate meal intake  Comments/MD notified: dextrose given    Wayne Young

## 2018-07-29 ENCOUNTER — Inpatient Hospital Stay (HOSPITAL_COMMUNITY): Payer: Medicaid Other

## 2018-07-29 LAB — GLUCOSE, CAPILLARY
GLUCOSE-CAPILLARY: 187 mg/dL — AB (ref 70–99)
GLUCOSE-CAPILLARY: 122 mg/dL — AB (ref 70–99)
GLUCOSE-CAPILLARY: 89 mg/dL (ref 70–99)
Glucose-Capillary: 188 mg/dL — ABNORMAL HIGH (ref 70–99)
Glucose-Capillary: 231 mg/dL — ABNORMAL HIGH (ref 70–99)
Glucose-Capillary: 62 mg/dL — ABNORMAL LOW (ref 70–99)
Glucose-Capillary: 80 mg/dL (ref 70–99)
Glucose-Capillary: 86 mg/dL (ref 70–99)

## 2018-07-29 LAB — BASIC METABOLIC PANEL
Anion gap: 8 (ref 5–15)
Anion gap: 8 (ref 5–15)
BUN: 10 mg/dL (ref 8–23)
BUN: 7 mg/dL — AB (ref 8–23)
CALCIUM: 8.4 mg/dL — AB (ref 8.9–10.3)
CHLORIDE: 107 mmol/L (ref 98–111)
CO2: 26 mmol/L (ref 22–32)
CO2: 29 mmol/L (ref 22–32)
Calcium: 8.7 mg/dL — ABNORMAL LOW (ref 8.9–10.3)
Chloride: 111 mmol/L (ref 98–111)
Creatinine, Ser: 0.78 mg/dL (ref 0.61–1.24)
Creatinine, Ser: 0.71 mg/dL (ref 0.61–1.24)
GFR calc Af Amer: >60 mL/min (ref >60)
GFR calc Af Amer: >60 mL/min (ref >60)
GFR calc non Af Amer: >60 mL/min (ref >60)
GFR calc non Af Amer: >60 mL/min (ref >60)
GLUCOSE: 203 mg/dL — AB (ref 70–99)
GLUCOSE: 98 mg/dL (ref 70–99)
Potassium: 2.9 mmol/L — ABNORMAL LOW (ref 3.5–5.1)
Potassium: 4.1 mmol/L (ref 3.5–5.1)
Sodium: 145 mmol/L (ref 135–145)
Sodium: 144 mmol/L (ref 135–145)

## 2018-07-29 LAB — PHOSPHORUS
Phosphorus: 1.7 mg/dL — ABNORMAL LOW (ref 2.5–4.6)
Phosphorus: 4 mg/dL (ref 2.5–4.6)

## 2018-07-29 LAB — MAGNESIUM
Magnesium: 1.9 mg/dL (ref 1.7–2.4)
Magnesium: 2.2 mg/dL (ref 1.7–2.4)

## 2018-07-29 MED ORDER — HYDROCHLOROTHIAZIDE 25 MG PO TABS
25.0000 mg | ORAL_TABLET | Freq: Every day | ORAL | Status: DC
  Administered 2018-07-30: 25 mg via ORAL
  Filled 2018-07-29: qty 1

## 2018-07-29 MED ORDER — POTASSIUM CHLORIDE 20 MEQ/15ML (10%) PO SOLN
40.0000 meq | ORAL | Status: AC
  Administered 2018-07-29 (×2): 40 meq
  Filled 2018-07-29 (×2): qty 30

## 2018-07-29 MED ORDER — POTASSIUM PHOSPHATE MONOBASIC 500 MG PO TABS
1000.0000 mg | ORAL_TABLET | Freq: Three times a day (TID) | ORAL | Status: DC
  Filled 2018-07-29 (×2): qty 2

## 2018-07-29 MED ORDER — AMLODIPINE BESYLATE 10 MG PO TABS
10.0000 mg | ORAL_TABLET | Freq: Every day | ORAL | Status: DC
  Administered 2018-07-29 – 2018-07-30 (×2): 10 mg via ORAL
  Filled 2018-07-29 (×3): qty 1

## 2018-07-29 MED ORDER — ALBUTEROL SULFATE (2.5 MG/3ML) 0.083% IN NEBU
2.5000 mg | INHALATION_SOLUTION | RESPIRATORY_TRACT | Status: DC | PRN
  Administered 2018-07-29 – 2018-08-11 (×5): 2.5 mg via RESPIRATORY_TRACT
  Filled 2018-07-29 (×6): qty 3

## 2018-07-29 MED ORDER — ORAL CARE MOUTH RINSE
15.0000 mL | Freq: Two times a day (BID) | OROMUCOSAL | Status: DC
  Administered 2018-07-29 – 2018-07-30 (×3): 15 mL via OROMUCOSAL

## 2018-07-29 MED ORDER — SODIUM CHLORIDE 0.9 % IV SOLN
INTRAVENOUS | Status: DC
  Administered 2018-07-29: 300 mL via INTRAVENOUS
  Administered 2018-07-29: 250 mL via INTRAVENOUS
  Administered 2018-08-02: 10:00:00 via INTRAVENOUS
  Administered 2018-08-15: 500 mL via INTRAVENOUS
  Administered 2018-08-17 – 2018-08-19 (×2): via INTRAVENOUS

## 2018-07-29 MED ORDER — K PHOS MONO-SOD PHOS DI & MONO 155-852-130 MG PO TABS
1000.0000 mg | ORAL_TABLET | Freq: Three times a day (TID) | ORAL | Status: DC
  Administered 2018-07-29 – 2018-07-30 (×3): 1000 mg via ORAL
  Filled 2018-07-29 (×4): qty 4

## 2018-07-29 MED ORDER — GUAIFENESIN ER 600 MG PO TB12
600.0000 mg | ORAL_TABLET | Freq: Two times a day (BID) | ORAL | Status: DC
  Administered 2018-07-29 – 2018-07-30 (×3): 600 mg via ORAL
  Filled 2018-07-29 (×4): qty 1

## 2018-07-29 NOTE — Procedures (Signed)
Extubation Procedure Note  Patient Details:   Name: Wayne Young DOB: 11-23-1955 MRN: [id number]   Airway Documentation:    Vent end date: 07/29/18 Vent end time: 1217   Evaluation  O2 sats: stable throughout Complications: No apparent complications Patient did tolerate procedure well. Bilateral Breath Sounds: Clear, Diminished   Yes   RT extubated patient per MD order with RN at bedside. Positive cuff leak noted. Patient is currently sating 96% on 4L [place]. No stridor noted. RT will continue to monitor as needed.   Lura Em 07/29/2018, 12:22 PM

## 2018-07-29 NOTE — Progress Notes (Signed)
[facility] ADULT ICU REPLACEMENT PROTOCOL FOR AM LAB REPLACEMENT ONLY  The patient does apply for the [facility] Adult ICU Electrolyte Replacment Protocol based on the criteria listed below:   1. Is GFR >/= 40 ml/min? Yes.    Patient's GFR today is >60 2. Is urine output >/= 0.5 ml/kg/hr for the last 6 hours? Yes.   Patient's UOP is 0.5 ml/kg/hr 3. Is BUN < 60 mg/dL? Yes.    Patient's BUN today is 10 4. Abnormal electrolyte(s): k 2.9 5. Ordered repletion with: protocol 6. If a panic level lab has been reported, has the CCM MD in charge been notified? No..   Physician:    Markus Daft A 07/29/2018 4:20 AM

## 2018-07-29 NOTE — Progress Notes (Addendum)
PULMONARY / CRITICAL CARE MEDICINE   NAME:  Wayne Young, MRN:  [id number], DOB:  Apr 09, 1956, LOS: 4 ADMISSION DATE:  07/25/2018,   CHIEF COMPLAINT: Dyspnea and declining mental status.  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  He was intubated in the department of emergency medicine HISTORY OF PRESENT ILLNESS   In addition to the above he is known to be HIV negative. SIGNIFICANT PAST MEDICAL HISTORY    Rheumatoid arthritis, positive PPD. SIGNIFICANT EVENTS:   STUDIES:    CULTURES:  No positive cultures to date  ANTIBIOTICS:  Zosyn and vancomycin added 10/7  LINES/TUBES:   No lines CONSULTANTS:   SUBJECTIVE:  Mild fever yesterday evening to 100.92F. Patient was hypokalemic in AM. Replaced per protocol   CONSTITUTIONAL: BP 121/77   Pulse 93   Temp 98.9 F (37.2 C) (Oral)   Resp 14   Ht 5\' 11"  (1.803 m)   Wt 61.8 kg   SpO2 99%   BMI 19.00 kg/m   I/O last 3 completed shifts: In: 3202.7 [I.V.:1401.1; NG/GT:1060.6; IV Piggyback:741.1] Out: 1393 [Urine:1393]     Vent Mode: PSV;CPAP FiO2 (%):  [40 %] 40 % Set Rate:  [12 bmp] 12 bmp Vt Set:  [600 mL] 600 mL PEEP:  [5 cmH20] 5 cmH20 Pressure Support:  [12 cmH20] 12 cmH20 Plateau Pressure:  [14 cmH20-15 cmH20] 14 cmH20  PHYSICAL EXAM: General: on ventilator, alert, NAD Neuro: alert, follows commands, PERRL, moves all extremities without obvious deficient HENT: No JVD Cardiovascular: rrr, no mrg Lungs: ETT in place, no wheezes heard on exam Abdomen: soft, nontender, nondistended Musculoskeletal: No edema  RESOLVED PROBLEM LIST   ASSESSMENT AND PLAN   AMS. Improved. Unclear etiology. MRI and EEG wnl. Possible ?intracranial infection, but seems unlikely given  improvement. Could explain weightloss (see below) - consider LP in future if worsens - RASS 1,0 - Fentanyl gtt - consider stopping Keppra after 24 hrs extubated   Respiratory Distress Decreased FiO2,but elevated PEEP.  CXR shows developing LLL PNA - Extubate  Weight loss. Per patient ~50 lbs. 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 Pan imaging has not shown mass suspicious for tumor. HIV negative. Possible may have CNS infection, or malabsorbtive issues. WBC stable. Mild fever.  - monitor fever curve - consider LP and neuro c/s in outpatient  Hypokalemia Hypophosphatemia Repleted K, Phos per protocol  - repeat BMET in PM - Mg, PHos  Diabetes Mellitis Hyperglycemia - stop D5 IVF - cont lantus 12, SSI  SUMMARY OF TODAY'S PLAN:  Wean from Vent. Narrow abx.   Best Practice / Goals of Care / Disposition.   DVT PROPHYLAXIS: Lovenox SUP: Pepcid NUTRITION: Tube feeds MOBILITY: bed rest  LABS  Glucose Recent Labs  Lab 07/28/18 1141 07/28/18 1521 07/28/18 1949 07/29/18 0007 07/29/18 0335 07/29/18 0742  GLUCAP 87 105* 161* 231* 187* 188*    BMET Recent Labs  Lab 07/27/18 0355 07/28/18 0341 07/29/18 0311  NA 137 143 145  K 3.4* 3.3* 2.9*  CL 97* 108 111  CO2 27 28 26   BUN 13 9 10   CREATININE 0.91 0.92 0.78  GLUCOSE 108* 84 203*    Liver Enzymes Recent Labs  Lab 07/25/18 1102  AST 21  ALT 19  ALKPHOS 46  BILITOT 1.2  ALBUMIN 3.9    Electrolytes Recent Labs  Lab 07/27/18 0355 07/28/18 0341 07/28/18 1408 07/28/18 1847 07/29/18 0311  CALCIUM 8.7* 8.4*  --   --  8.4*  MG  --   --  1.8 2.0 1.9  PHOS 3.8  --  1.8* 1.7* 1.7*    CBC Recent Labs  Lab 07/26/18 0131 07/27/18 0355 07/28/18 0341  WBC 5.3 11.2* 7.0  HGB 15.4 15.0 13.0  HCT 45.6 47.5 41.2  PLT 143* 120* 112*    ABG Recent Labs  Lab 07/25/18 1630 07/26/18 0445  PHART 7.453* 7.569*  PCO2ART 52.2* 30.7*  PO2ART 499.0* 117*    Coag's No results for input(s):  APTT, INR in the last 168 hours.  Sepsis Markers Recent Labs  Lab 07/26/18 1011 07/27/18 0355 07/28/18 0341  PROCALCITON 0.11 0.30 0.44    Cardiac Enzymes No results for input(s): TROPONINI, PROBNP in the last 168 hours.  PAST MEDICAL HISTORY :   He  has a past medical history of Arthritis, Diabetes mellitus without complication (HCC), Glaucoma, and Hypertension.  PAST SURGICAL HISTORY:  He  has a past surgical history that includes Hernia repair.  No Known Allergies  No current facility-administered medications on file prior to encounter.    Current Outpatient Medications on File Prior to Encounter  Medication Sig  . amLODipine (NORVASC) 10 MG tablet Take 10 mg by mouth daily.   Marland Kitchen atorvastatin (LIPITOR) 20 MG tablet Take 20 mg by mouth at bedtime.  . brimonidine (ALPHAGAN) 0.2 % ophthalmic solution Place 2 drops into both eyes 3 (three) times daily.  . dorzolamide-timolol (COSOPT) 22.3-6.8 MG/ML ophthalmic solution Place 2 drops into both eyes 2 (two) times daily.  . folic acid (FOLVITE) 1 MG tablet Take by mouth.  Marland Kitchen HUMULIN R 100 UNIT/ML injection Inject 5-6 Units into the skin See admin instructions. 5 units in the morning and 6 units at night  . hydrochlorothiazide (HYDRODIURIL) 25 MG tablet Take by mouth.  . insulin NPH Human (HUMULIN N,NOVOLIN N) 100 UNIT/ML injection Inject into the skin.  . metFORMIN (GLUCOPHAGE) 850 MG tablet Take by mouth.  . methotrexate (RHEUMATREX) 2.5 MG tablet Take by mouth.  . Multiple Vitamin (MULTIVITAMIN) tablet Take 1 tablet by mouth daily.  . pantoprazole (PROTONIX) 40 MG tablet Take 40 mg by mouth daily before breakfast.     FAMILY HISTORY:   His family history includes Asthma in his mother.  SOCIAL HISTORY:  He  reports that he has quit smoking. He has never used smokeless tobacco. He reports that he does not drink alcohol or use drugs.  REVIEW OF SYSTEMS:     Not obtainable   Thomes Dinning, MD, MS FAMILY MEDICINE RESIDENT -  PGY2 07/29/2018 8:25 AM

## 2018-07-29 NOTE — Care Management Note (Signed)
Case Management Note  Patient Details  Name: Keygan Dumond MRN: [id number] Date of Birth: 05-16-1956  Subjective/Objective:    Pt admitted with SOB, involuntary weight loss   - and declining mental status               Action/Plan:   PTA from home with wife. Pt intubated on arrival and extubated 10/11.  Pt will need PT and nutritional consult when appropriate    Expected Discharge Date:                  Expected Discharge Plan:     In-House Referral:  Clinical Social Work  Discharge planning Services  CM Consult  Post Acute Care Choice:    Choice offered to:     DME Arranged:    DME Agency:     HH Arranged:    HH Agency:     Status of Service:  In process, will continue to follow  If discussed at Long Length of Stay Meetings, dates discussed:    Additional Comments:  Cherylann Parr, RN 07/29/2018, 4:01 PM

## 2018-07-29 NOTE — Progress Notes (Addendum)
 ' of Fentanyl wasted in sink witnessed by Autumn, Charity fundraiser.

## 2018-07-30 ENCOUNTER — Other Ambulatory Visit: Payer: Self-pay

## 2018-07-30 LAB — BASIC METABOLIC PANEL
Anion gap: 9 (ref 5–15)
CALCIUM: 8.8 mg/dL — AB (ref 8.9–10.3)
CHLORIDE: 103 mmol/L (ref 98–111)
CO2: 31 mmol/L (ref 22–32)
CREATININE: 0.64 mg/dL (ref 0.61–1.24)
GFR calc Af Amer: >60 mL/min (ref >60)
Glucose, Bld: 107 mg/dL — ABNORMAL HIGH (ref 70–99)
Potassium: 3 mmol/L — ABNORMAL LOW (ref 3.5–5.1)
SODIUM: 143 mmol/L (ref 135–145)

## 2018-07-30 LAB — GLUCOSE, CAPILLARY
GLUCOSE-CAPILLARY: 94 mg/dL (ref 70–99)
GLUCOSE-CAPILLARY: 85 mg/dL (ref 70–99)
GLUCOSE-CAPILLARY: 248 mg/dL — AB (ref 70–99)
Glucose-Capillary: 73 mg/dL (ref 70–99)
Glucose-Capillary: 117 mg/dL — ABNORMAL HIGH (ref 70–99)
Glucose-Capillary: 159 mg/dL — ABNORMAL HIGH (ref 70–99)
Glucose-Capillary: 90 mg/dL (ref 70–99)

## 2018-07-30 MED ORDER — DORZOLAMIDE HCL-TIMOLOL MAL 2-0.5 % OP SOLN
2.0000 [drp] | Freq: Two times a day (BID) | OPHTHALMIC | Status: DC
  Administered 2018-07-30 – 2018-08-20 (×44): 2 [drp] via OPHTHALMIC
  Filled 2018-07-30: qty 10

## 2018-07-30 MED ORDER — FAMOTIDINE 20 MG PO TABS
20.0000 mg | ORAL_TABLET | Freq: Two times a day (BID) | ORAL | Status: DC
  Administered 2018-07-30: 20 mg via ORAL
  Filled 2018-07-30 (×2): qty 1

## 2018-07-30 MED ORDER — INSULIN ASPART 100 UNIT/ML ~~LOC~~ SOLN
0.0000 [IU] | Freq: Three times a day (TID) | SUBCUTANEOUS | Status: DC
  Administered 2018-07-30: 3 [IU] via SUBCUTANEOUS
  Administered 2018-07-30: 2 [IU] via SUBCUTANEOUS
  Administered 2018-07-31 – 2018-08-01 (×2): 1 [IU] via SUBCUTANEOUS

## 2018-07-30 MED ORDER — POTASSIUM CHLORIDE CRYS ER 20 MEQ PO TBCR
40.0000 meq | EXTENDED_RELEASE_TABLET | Freq: Once | ORAL | Status: AC
  Administered 2018-07-30: 40 meq via ORAL
  Filled 2018-07-30: qty 2

## 2018-07-30 MED ORDER — BRIMONIDINE TARTRATE 0.2 % OP SOLN
2.0000 [drp] | Freq: Three times a day (TID) | OPHTHALMIC | Status: DC
  Administered 2018-07-30 – 2018-08-20 (×66): 2 [drp] via OPHTHALMIC
  Filled 2018-07-30 (×2): qty 5

## 2018-07-30 MED ORDER — IPRATROPIUM-ALBUTEROL 0.5-2.5 (3) MG/3ML IN SOLN
3.0000 mL | Freq: Four times a day (QID) | RESPIRATORY_TRACT | Status: DC
  Administered 2018-07-30 – 2018-08-08 (×35): 3 mL via RESPIRATORY_TRACT
  Filled 2018-07-30 (×36): qty 3

## 2018-07-30 NOTE — Progress Notes (Signed)
Transfer from 71M  Arrival Method: Bed  Mental Orientation: A&O X4 Telemetry: Not ordered Assessment: Completed Skin: See Flowsheets IV: WDL Pain:0 Safety Measures: Safety Fall Prevention Plan has been given, discussed and signed Unit Orientation: Patient has been orientated to the room, unit and staff.  Family: Wife at bedside  Orders have been reviewed and implemented. Will continue to monitor the patient. Call light has been placed within reach and bed alarm has been activated.    Selina Cooley RN, BSN

## 2018-07-30 NOTE — Progress Notes (Signed)
PULMONARY / CRITICAL CARE MEDICINE   NAME:  Wayne Young, MRN:  [id number], DOB:  January 18, 1956, LOS: 5 ADMISSION DATE:  07/25/2018,   CHIEF COMPLAINT: Dyspnea and declining mental status.  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  He was intubated in the department of emergency medicine  HISTORY OF PRESENT ILLNESS   In addition to the above he is known to be HIV negative. SIGNIFICANT PAST MEDICAL HISTORY    Rheumatoid arthritis, positive PPD. SIGNIFICANT EVENTS:   STUDIES:   10/8 Ven Doppler > neg   CULTURES:  BC 10/8 >  UC 10/8 >Neg   ANTIBIOTICS:  Zosyn and vancomycin added 10/7  LINES/TUBES:   ETT 10/7>10/9>10/9 >10/11   CONSULTANTS:   SUBJECTIVE:  Extubated 10/11 , O2 sats good , sitting up in bed eating .   CONSTITUTIONAL: BP (!) 172/93   Pulse (!) 110   Temp 97.8 F (36.6 C) (Oral)   Resp (!) 32   Ht 5\' 11"  (1.803 m)   Wt 62.7 kg   SpO2 100%   BMI 19.28 kg/m   I/O last 3 completed shifts: In: 3178.6 [I.V.:1002.7; NG/GT:1500; IV Piggyback:675.9] Out: 1641 [Urine:1641]      PHYSICAL EXAM: General: Frail elderly male, NAD  Neuro: Alert, follows commands, PERRLA, moves all extremities without deficit noted HENT: [place]/AT, Cardiovascular: RRR, no MRG  Lungs: Decreased breath sounds in the bases no wheezing noted  Abdomen: Soft, nontender positive bowel sounds  Musculoskeletal: No edema  RESOLVED PROBLEM LIST   ASSESSMENT AND PLAN   AMS. Improved. Unclear etiology. MRI and EEG wnl. Possible ?intracranial infection, but seems unlikely given improvement. Could explain weightloss (see below) - consider LP in future if worsens-   - consider stopping Keppra after 24 hrs extubated-?10/13  -avoid oversedation, d/c  fentanyl   Respiratory Distress/PNA  Decreased FiO2,but elevated PEEP.  CXR shows developing LLL PNA - Extubated 10/11 .  -cont IV ABX   Weight loss. Per patient ~50 lbs. 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 Pan imaging has not shown mass suspicious for tumor. HIV negative. Possible may have CNS infection, or malabsorbtive issues. - monitor fever curve - consider LP and neuro c/s in outpatient  Hypokalemia Hypophosphatemia-resolved  Replete K    Diabetes Mellitis Hyperglycemia  - cont lantus 12, SSI  Rheumatoid Arthritis   -methotrexate on hold   SUMMARY OF TODAY'S PLAN:  Improved . O2 demands decreased, doing well on 2l/m O2 .  Transfer to Med Surg, to [facility]. Call As needed    Best Practice / Goals of Care / Disposition.   DVT PROPHYLAXIS: Lovenox SUP: Pepcid NUTRITION: Reg diet  MOBILITY: bed rest  LABS  Glucose Recent Labs  Lab 07/29/18 2119 07/29/18 2347 07/30/18 0102 07/30/18 0205 07/30/18 0415 07/30/18 0736  GLUCAP 80 62* 73 117* 94 85    BMET Recent Labs  Lab 07/29/18 0311 07/29/18 2007 07/30/18 0415  NA 145 144 143  K 2.9* 4.1 3.0*  CL 111 107 103  CO2 26 29 31   BUN 10 7* <5*  CREATININE 0.78 0.71 0.64  GLUCOSE 203* 98 107*    Liver Enzymes Recent Labs  Lab 07/25/18 1102  AST 21  ALT 19  ALKPHOS 46  BILITOT 1.2  ALBUMIN 3.9    Electrolytes Recent Labs  Lab 07/28/18 1847 07/29/18 0311 07/29/18 2007 07/30/18 0415  CALCIUM  --  8.4* 8.7* 8.8*  MG 2.0 1.9 2.2  --   PHOS 1.7* 1.7* 4.0  --     CBC Recent Labs  Lab 07/26/18 0131 07/27/18 0355 07/28/18 0341  WBC 5.3 11.2* 7.0  HGB 15.4 15.0 13.0  HCT 45.6 47.5 41.2  PLT 143* 120* 112*    ABG Recent Labs  Lab 07/25/18 1630 07/26/18 0445  PHART 7.453* 7.569*  PCO2ART 52.2* 30.7*  PO2ART 499.0* 117*    Coag's No results for input(s): APTT, INR in the last 168 hours.  Sepsis Markers Recent Labs  Lab 07/26/18 1011 07/27/18 0355 07/28/18 0341   PROCALCITON 0.11 0.30 0.44    Cardiac Enzymes No results for input(s): TROPONINI, PROBNP in the last 168 hours.     Jacson Rapaport NP-C  [facility]  [phone]   07/30/2018 9:37 AM

## 2018-07-30 NOTE — Progress Notes (Signed)
Pharmacy Antibiotic Note  Wayne Young is a 62 y.o. male admitted on 07/25/2018 with fever, AMS. PMH s/f RA on methotrexate, s/p treatment for latent TB. Pharmacy has been consulted for Zosyn dosing. WBC WNL, last true fever on 10/8 to 101.6, but has continued elevated temps in low 100s. CXR now shows developing LLL PNA. Scr stable < 1, estimated CrCl ~82 mL/min.  Plan: Continue Zosyn 3.375gm IV q8h F/u clinical status, C&S, renal function, LOT  Height: 5\' 11"  (180.3 cm) Weight: 138 lb 3.7 oz (62.7 kg) IBW/kg (Calculated) : 75.3  Temp (24hrs), Avg:98.9 F (37.2 C), Min:97.8 F (36.6 C), Max:100.2 F (37.9 C)  Recent Labs  Lab 07/25/18 1102 07/26/18 0131 07/27/18 0355 07/28/18 0341 07/29/18 0311 07/29/18 2007 07/30/18 0415  WBC 4.4 5.3 11.2* 7.0  --   --   --   CREATININE 0.77  --  0.91 0.92 0.78 0.71 0.64    Estimated Creatinine Clearance: 82.7 mL/min (by C-G formula based on SCr of 0.64 mg/dL).   No Known Allergies  Antibiotics this admission Vanc 10/8 >> 10/9 Zosyn 10/8 >>  Microbiology 10/7 MRSA PCR - negative 10/8 BCx - NG x4 10/8 AFB - negtive 10/8 UCx - NG final  Wayne Young N. 12/8, PharmD PGY2 Infectious Diseases Pharmacy Resident Phone: [phone] 07/30/2018, 11:17 AM

## 2018-07-30 NOTE — Progress Notes (Signed)
Hypoglycemic Event  CBG: 63  Treatment: 15 GM carbohydrate snack  Symptoms: None  Follow-up CBG: Time:0102 CBG Result:73  Possible Reasons for Event: Inadequate meal intake  Comments/MD notified:    Della Goo

## 2018-07-31 ENCOUNTER — Inpatient Hospital Stay (HOSPITAL_COMMUNITY): Payer: Medicaid Other

## 2018-07-31 ENCOUNTER — Inpatient Hospital Stay: Payer: Self-pay

## 2018-07-31 DIAGNOSIS — J9601 Acute respiratory failure with hypoxia: Principal | ICD-10-CM

## 2018-07-31 LAB — BASIC METABOLIC PANEL
Anion gap: 15 (ref 5–15)
CHLORIDE: 93 mmol/L — AB (ref 98–111)
CO2: 32 mmol/L (ref 22–32)
Calcium: 9.7 mg/dL (ref 8.9–10.3)
Creatinine, Ser: 0.73 mg/dL (ref 0.61–1.24)
GFR calc non Af Amer: >60 mL/min (ref >60)
Glucose, Bld: 148 mg/dL — ABNORMAL HIGH (ref 70–99)
POTASSIUM: 2.8 mmol/L — AB (ref 3.5–5.1)
SODIUM: 140 mmol/L (ref 135–145)

## 2018-07-31 LAB — PROCALCITONIN

## 2018-07-31 LAB — GLUCOSE, CAPILLARY
GLUCOSE-CAPILLARY: 146 mg/dL — AB (ref 70–99)
GLUCOSE-CAPILLARY: 115 mg/dL — AB (ref 70–99)
GLUCOSE-CAPILLARY: 88 mg/dL (ref 70–99)
GLUCOSE-CAPILLARY: 104 mg/dL — AB (ref 70–99)
Glucose-Capillary: 141 mg/dL — ABNORMAL HIGH (ref 70–99)

## 2018-07-31 LAB — POCT I-STAT 3, ART BLOOD GAS (G3+)
ACID-BASE EXCESS: 13 mmol/L — AB (ref 0.0–2.0)
BICARBONATE: 38.7 mmol/L — AB (ref 20.0–28.0)
O2 Saturation: 100 %
PCO2 ART: 51.1 mmHg — AB (ref 32.0–48.0)
PO2 ART: 431 mmHg — AB (ref 83.0–108.0)
Patient temperature: 98
TCO2: 40 mmol/L — ABNORMAL HIGH (ref 22–32)
pH, Arterial: 7.486 — ABNORMAL HIGH (ref 7.350–7.450)

## 2018-07-31 LAB — BLOOD GAS, ARTERIAL
Acid-Base Excess: 13.1 mmol/L — ABNORMAL HIGH (ref 0.0–2.0)
Bicarbonate: 39.1 mmol/L — ABNORMAL HIGH (ref 20.0–28.0)
DRAWN BY: 44135
O2 CONTENT: 3 L/min
O2 Saturation: 93.8 %
PCO2 ART: 71.5 mmHg — AB (ref 32.0–48.0)
PH ART: 7.357 (ref 7.350–7.450)
PO2 ART: 73.4 mmHg — AB (ref 83.0–108.0)
Patient temperature: 98.6

## 2018-07-31 LAB — CULTURE, BLOOD (ROUTINE X 2)
CULTURE: NO GROWTH
Culture: NO GROWTH

## 2018-07-31 LAB — CBC
HCT: 47.5 % (ref 39.0–52.0)
HEMOGLOBIN: 15.2 g/dL (ref 13.0–17.0)
MCH: 28.6 pg (ref 26.0–34.0)
MCHC: 32 g/dL (ref 30.0–36.0)
MCV: 89.3 fL (ref 80.0–100.0)
Platelets: 209 10*3/uL (ref 150–400)
RBC: 5.32 MIL/uL (ref 4.22–5.81)
RDW: 12.2 % (ref 11.5–15.5)
WBC: 7.2 10*3/uL (ref 4.0–10.5)
nRBC: 0 % (ref 0.0–0.2)

## 2018-07-31 LAB — TRIGLYCERIDES: Triglycerides: 164 mg/dL — ABNORMAL HIGH (ref <150)

## 2018-07-31 LAB — MAGNESIUM: Magnesium: 1.8 mg/dL (ref 1.7–2.4)

## 2018-07-31 MED ORDER — LORAZEPAM 2 MG/ML IJ SOLN
INTRAMUSCULAR | Status: AC
  Filled 2018-07-31: qty 1

## 2018-07-31 MED ORDER — ETOMIDATE 2 MG/ML IV SOLN
20.0000 mg | Freq: Once | INTRAVENOUS | Status: AC
  Administered 2018-07-31: 20 mg via INTRAVENOUS

## 2018-07-31 MED ORDER — SODIUM CHLORIDE 0.9% FLUSH
10.0000 mL | Freq: Two times a day (BID) | INTRAVENOUS | Status: DC
  Administered 2018-07-31: 20 mL
  Administered 2018-08-01 – 2018-08-13 (×25): 10 mL
  Administered 2018-08-14: 20 mL
  Administered 2018-08-14 – 2018-08-20 (×13): 10 mL

## 2018-07-31 MED ORDER — POTASSIUM CHLORIDE 10 MEQ/50ML IV SOLN
10.0000 meq | INTRAVENOUS | Status: AC
  Administered 2018-07-31 (×2): 10 meq via INTRAVENOUS
  Filled 2018-07-31: qty 50

## 2018-07-31 MED ORDER — PROPOFOL 1000 MG/100ML IV EMUL
5.0000 ug/kg/min | INTRAVENOUS | Status: DC
  Administered 2018-07-31: 5 ug/kg/min via INTRAVENOUS
  Administered 2018-07-31: 40 ug/kg/min via INTRAVENOUS
  Administered 2018-08-01: 10 ug/kg/min via INTRAVENOUS
  Filled 2018-07-31 (×2): qty 100

## 2018-07-31 MED ORDER — FENTANYL CITRATE (PF) 100 MCG/2ML IJ SOLN
100.0000 ug | Freq: Once | INTRAMUSCULAR | Status: AC
  Administered 2018-07-31: 100 ug via INTRAVENOUS

## 2018-07-31 MED ORDER — LACTATED RINGERS IV SOLN
INTRAVENOUS | Status: DC
  Administered 2018-07-31 – 2018-08-02 (×4): via INTRAVENOUS

## 2018-07-31 MED ORDER — POTASSIUM CHLORIDE 10 MEQ/50ML IV SOLN
INTRAVENOUS | Status: AC
  Filled 2018-07-31: qty 50

## 2018-07-31 MED ORDER — POTASSIUM CHLORIDE 10 MEQ/100ML IV SOLN
10.0000 meq | INTRAVENOUS | Status: AC
  Administered 2018-07-31 (×2): 10 meq via INTRAVENOUS
  Filled 2018-07-31 (×2): qty 100

## 2018-07-31 MED ORDER — MIDAZOLAM HCL 2 MG/2ML IJ SOLN
0.5000 mg | INTRAMUSCULAR | Status: AC | PRN
  Administered 2018-07-31 (×3): 0.5 mg via INTRAVENOUS

## 2018-07-31 MED ORDER — PROPOFOL 1000 MG/100ML IV EMUL
INTRAVENOUS | Status: AC
  Filled 2018-07-31: qty 100

## 2018-07-31 MED ORDER — FENTANYL CITRATE (PF) 100 MCG/2ML IJ SOLN
INTRAMUSCULAR | Status: AC
  Filled 2018-07-31: qty 2

## 2018-07-31 MED ORDER — CHLORHEXIDINE GLUCONATE CLOTH 2 % EX PADS
6.0000 | MEDICATED_PAD | Freq: Every day | CUTANEOUS | Status: DC
  Administered 2018-07-31 – 2018-08-20 (×20): 6 via TOPICAL

## 2018-07-31 MED ORDER — GERHARDT'S BUTT CREAM
TOPICAL_CREAM | CUTANEOUS | Status: DC | PRN
  Filled 2018-07-31 (×2): qty 1

## 2018-07-31 MED ORDER — LORAZEPAM 2 MG/ML IJ SOLN
0.5000 mg | INTRAMUSCULAR | Status: DC | PRN
  Administered 2018-07-31 – 2018-08-20 (×19): 0.5 mg via INTRAVENOUS
  Filled 2018-07-31 (×22): qty 1

## 2018-07-31 MED ORDER — FAMOTIDINE 40 MG/5ML PO SUSR
20.0000 mg | Freq: Two times a day (BID) | ORAL | Status: DC
  Administered 2018-07-31 – 2018-08-20 (×41): 20 mg
  Filled 2018-07-31 (×42): qty 2.5

## 2018-07-31 MED ORDER — CHLORHEXIDINE GLUCONATE 0.12% ORAL RINSE (MEDLINE KIT)
15.0000 mL | Freq: Two times a day (BID) | OROMUCOSAL | Status: DC
  Administered 2018-07-31 – 2018-08-20 (×41): 15 mL via OROMUCOSAL

## 2018-07-31 MED ORDER — GUAIFENESIN 100 MG/5ML PO SOLN
10.0000 mL | ORAL | Status: DC
  Administered 2018-07-31 – 2018-08-11 (×65): 200 mg via ORAL
  Filled 2018-07-31 (×69): qty 10

## 2018-07-31 MED ORDER — MIDAZOLAM HCL 2 MG/2ML IJ SOLN
INTRAMUSCULAR | Status: AC
  Administered 2018-07-31: 2 mg
  Filled 2018-07-31: qty 4

## 2018-07-31 MED ORDER — FENTANYL CITRATE (PF) 100 MCG/2ML IJ SOLN
25.0000 ug | INTRAMUSCULAR | Status: AC | PRN
  Administered 2018-07-31 (×3): 25 ug via INTRAVENOUS

## 2018-07-31 MED ORDER — MIDAZOLAM HCL 2 MG/2ML IJ SOLN
2.0000 mg | Freq: Once | INTRAMUSCULAR | Status: DC
  Filled 2018-07-31: qty 2

## 2018-07-31 MED ORDER — AMLODIPINE BESYLATE 10 MG PO TABS
10.0000 mg | ORAL_TABLET | Freq: Every day | ORAL | Status: DC
  Administered 2018-08-01 – 2018-08-20 (×19): 10 mg
  Filled 2018-07-31 (×20): qty 1

## 2018-07-31 MED ORDER — SODIUM CHLORIDE 0.9% FLUSH
10.0000 mL | INTRAVENOUS | Status: DC | PRN
  Administered 2018-08-09 – 2018-08-11 (×2): 10 mL
  Filled 2018-07-31 (×2): qty 40

## 2018-07-31 MED ORDER — SODIUM CHLORIDE 0.9 % IV BOLUS
1000.0000 mL | Freq: Once | INTRAVENOUS | Status: AC
  Administered 2018-07-31: 1000 mL via INTRAVENOUS

## 2018-07-31 MED ORDER — ORAL CARE MOUTH RINSE
15.0000 mL | OROMUCOSAL | Status: DC
  Administered 2018-07-31 – 2018-08-20 (×190): 15 mL via OROMUCOSAL

## 2018-07-31 MED ORDER — LORAZEPAM 2 MG/ML IJ SOLN
1.0000 mg | Freq: Once | INTRAMUSCULAR | Status: AC
  Administered 2018-07-31: 0.5 mg via INTRAVENOUS
  Filled 2018-07-31: qty 1

## 2018-07-31 MED ORDER — MIDAZOLAM HCL 2 MG/2ML IJ SOLN
INTRAMUSCULAR | Status: AC
  Filled 2018-07-31: qty 2

## 2018-07-31 NOTE — Significant Event (Signed)
Rapid Response Event Note  Overview: Called by bedside RN for pt with increased work of breathing despite just receiving neb treatment.     Initial Focused Assessment: On arrival, pt using accessory muscles, tachypnic, with O2 sats 94% on 3L [place]. Lung sounds clear/diminished thoughout. Pt having a difficult time speaking. He is able to mouth words and whisper at times. He also states that he feels like he needs to cough up sputum but is unable. Pt exhibits an extremely weak non-productive cough although it sounds like he has sputum in upper airway. Pts breathing improves with redirection and distraction with significantly less distress and accessory use noted. Was able to instruct pt to take slow deep breathes without difficulty.   Interventions: CCM consulted, ABG, CXR, Ativan, NTS, NPO, SLP eval, CPT ordered, continuous spO2  Plan of Care (if not transferred): Pt stabilized and remains on unit. Continue to monitor resp status. Bedside RN instructed to call RT for NTS as needed. Awaiting speech eval. Informed RN to call with any questions or concerns.   Event Summary:  Called at  [phone]    Event ended at  0618        [facility]

## 2018-07-31 NOTE — Progress Notes (Signed)
Pt continues to have labored breathing, using accessory muscles, tachypnic. O2 saturation still remains stable. Notified rapid response, and respiratory therapy. Also notified Dr. Darrick Penna.

## 2018-07-31 NOTE — Progress Notes (Signed)
PULMONARY / CRITICAL CARE MEDICINE   NAME:  Wayne Young, MRN:  [id number], DOB:  Sep 10, 1956, LOS: 6 ADMISSION DATE:  07/25/2018,   CHIEF COMPLAINT: Dyspnea and declining mental status.  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  He was intubated in the department of emergency medicine HISTORY OF PRESENT ILLNESS   In addition to the above he is known to be HIV negative.  He was intubated and admitted and eventually weaned from mechanical ventilation.  At the time of weaning he appeared to have a normal chest x-ray, however he had a transient alteration in mental status and there was a question of whether or not he had suffered from a seizure.  He required reintubation.  He was again weaned from mechanical ventilation and transferred to the floor where today he has developed respiratory distress, extreme tachypnea, and extreme tachycardia.  He is overtly unable to clear his respiratory secretions.  I asked him if he has been having difficulty with weakness and he says it he thinks that he is been having some drip difficulty with walking in addition he thinks that he is been having some difficulty intermittently with diplopia.  He denies difficulty with swallowing he denies difficulty with dysphagia.  Pertinent to his weight loss, his quantiferrin [place] on this admission was negative. SIGNIFICANT PAST MEDICAL HISTORY    Rheumatoid arthritis, positive PPD. SIGNIFICANT EVENTS:   STUDIES:    CULTURES:  No positive cultures to date  ANTIBIOTICS:  Zosyn and vancomycin added 10/7  LINES/TUBES:   No lines CONSULTANTS:   SUBJECTIVE:      As above, he does not have a productive cough but tells me that his cough is so weak that he cannot clear  anything even if he feels as though it is down there.  CONSTITUTIONAL: BP (!) 175/93 (BP Location: Right Arm)   Pulse 96   Temp 97.7 F (36.5 C) (Oral)   Resp (!) 24   Ht 5\' 11"  (1.803 m)   Wt 62.7 kg   SpO2 91%   BMI 19.28 kg/m   I/O last 3 completed shifts: In: 1305.2 [P.O.:680; I.V.:57.2; IV Piggyback:568] Out: 1820 [Urine:1820]        PHYSICAL EXAM: General: Hectic elderly male in obvious distress.     Neuro: No obvious defect and gross strength, grip appears to be 5 out of 5 bilaterally.  He is not having obvious diplopia on lateral gaze.  He may have some slight tongue fasciculations.  Voice is incredibly weak HENT: No JVD Cardiovascular: S1 and S2 are rapid and regular without murmur rub or gallop  Resp: He is somewhat tachypneic and labored, there is symmetric air movement, no wheezes.     Abdomen: The abdomen is scaphoid and soft without organomegaly masses or tenderness   Musculoskeletal: No edema   RESOLVED PROBLEM LIST   ASSESSMENT AND PLAN   1.  Dyspnea.  He seems to have minimal pulmonary pathology,. and then intermittently develops severe respiratory embarrassment.  I am concerned that he either has neuromuscular disease precluding secretion parents, vocal cord paralysis preventing an effective cough, or achalasia preventing effective swallowing with subsequent recurrent aspiration.  A CTA during this hospitalization would support this speculation as there was no evidence  of PE and only some evidence of bibasilar infiltrates consistent with recurrent aspiration.  I have ordered an EMG and anti-acetylcholine receptor antibody as first steps in evaluating him for generalized weakness.  Speech pathology will see him today and if they feel he has vocal cord paralysis we will ask ENT to evaluate him.  He is sufficiently dyspneic that I think it would be far safer to transfer him to the intensive care unit where we can acutely intervene.   2.  Weight loss.  He gives a  history of TB exposure not supported by a negative quantiferrin on this admission.  Again I am concerned that he has achalasia or other difficulties in swallowing provoking the weight loss.  He has been extensively scanned for a primary tumor and her only finding was a hypertrophied prostate with a negative PSA 3.  Fever.  Again I suspect this may be suffering from recurrent aspiration.  I have ordered a procalcitonin to rule out aspiration with a resistant organism.  For now he continues on Zosyn.    SUMMARY OF TODAY'S PLAN:    Best Practice / Goals of Care / Disposition.   DVT PROPHYLAXIS: Lovenox SUP: Pepcid NUTRITION:  MOBILITY  LABS  Glucose Recent Labs  Lab 07/30/18 0415 07/30/18 0736 07/30/18 1118 07/30/18 1620 07/30/18 2217 07/31/18 0741  GLUCAP 94 85 248* 159* 90 141*    BMET Recent Labs  Lab 07/29/18 2007 07/30/18 0415 07/31/18 0710  NA 144 143 140  K 4.1 3.0* 2.8*  CL 107 103 93*  CO2 29 31 32  BUN 7* <5* <5*  CREATININE 0.71 0.64 0.73  GLUCOSE 98 107* 148*    Liver Enzymes Recent Labs  Lab 07/25/18 1102  AST 21  ALT 19  ALKPHOS 46  BILITOT 1.2  ALBUMIN 3.9    Electrolytes Recent Labs  Lab 07/28/18 1847 07/29/18 0311 07/29/18 2007 07/30/18 0415 07/31/18 0710  CALCIUM  --  8.4* 8.7* 8.8* 9.7  MG 2.0 1.9 2.2  --   --   PHOS 1.7* 1.7* 4.0  --   --     CBC Recent Labs  Lab 07/27/18 0355 07/28/18 0341 07/31/18 0710  WBC 11.2* 7.0 7.2  HGB 15.0 13.0 15.2  HCT 47.5 41.2 47.5  PLT 120* 112* 209    ABG Recent Labs  Lab 07/25/18 1630 07/26/18 0445 07/31/18 0500  PHART 7.453* 7.569* 7.357  PCO2ART 52.2* 30.7* 71.5*  PO2ART 499.0* 117* 73.4*    Coag's No results for input(s): APTT, INR in the last 168 hours.  Sepsis Markers Recent Labs  Lab 07/26/18 1011 07/27/18 0355 07/28/18 0341  PROCALCITON 0.11 0.30 0.44    Cardiac Enzymes No results for input(s): TROPONINI, PROBNP in the last 168 hours.  PAST MEDICAL HISTORY :    He  has a past medical history of Arthritis, Diabetes mellitus without complication (HCC), Glaucoma, and Hypertension.  PAST SURGICAL HISTORY:  He  has a past surgical history that includes Hernia repair.  No Known Allergies  No current facility-administered medications on file prior to encounter.    Current Outpatient Medications on File Prior to Encounter  Medication Sig  . amLODipine (NORVASC) 10 MG tablet Take 10 mg by mouth daily.   Marland Kitchen atorvastatin (LIPITOR) 20 MG tablet Take 20 mg by mouth at bedtime.  . brimonidine (ALPHAGAN) 0.2 % ophthalmic solution Place 2 drops into both eyes 3 (three) times daily.  . dorzolamide-timolol (COSOPT) 22.3-6.8 MG/ML ophthalmic solution Place 2 drops into both  eyes 2 (two) times daily.  . folic acid (FOLVITE) 1 MG tablet Take by mouth.  Marland Kitchen HUMULIN R 100 UNIT/ML injection Inject 5-6 Units into the skin See admin instructions. 5 units in the morning and 6 units at night  . hydrochlorothiazide (HYDRODIURIL) 25 MG tablet Take by mouth.  . insulin NPH Human (HUMULIN N,NOVOLIN N) 100 UNIT/ML injection Inject into the skin.  . metFORMIN (GLUCOPHAGE) 850 MG tablet Take by mouth.  . methotrexate (RHEUMATREX) 2.5 MG tablet Take by mouth.  . Multiple Vitamin (MULTIVITAMIN) tablet Take 1 tablet by mouth daily.  . pantoprazole (PROTONIX) 40 MG tablet Take 40 mg by mouth daily before breakfast.     FAMILY HISTORY:   His family history includes Asthma in his mother.  SOCIAL HISTORY:  He  reports that he has quit smoking. He has never used smokeless tobacco. He reports that he does not drink alcohol or use drugs.  REVIEW OF SYSTEMS:     Not obtainable   Greater than 32 minutes was spent in the care of this patient today.  Penny Pia, MD

## 2018-07-31 NOTE — Procedures (Signed)
Endotracheal intubation Indication: Respiratory extremis  Procedure: The patient was briefly placed on BiPAP but remained in respiratory extremis therefore was elected to reintubate the patient.  After explaining indications to the patient, he was preoxygenated with an Ambu bag. I did not use a paralytic as a wish to evaluate vocal cord function.  The cords were easily visualized using a 4 Miller blade, they appeared to move normally and oppose completely.  He was easily intubated with an 8 endotracheal tube to 22 cm at the lip.  CO2 was positive and there was symmetric air movement.  Chest x-ray shows a well-placed endotracheal tube.

## 2018-07-31 NOTE — Progress Notes (Signed)
PCCM Interval Note  Called to patient bedside for progressive tachypnea. On assessment ABG 7.357/71/73. CXR pending. Patient with weak cough and thick secretions. Improvement with NT suction. Will order NT suction PRN and Chest PT. Due to weak cough and concern for aspiration will keep NPO and order speech therapy evaluation.   Jovita Kussmaul, AGACNP-BC [facility]  PCCM Pgr: [phone]

## 2018-07-31 NOTE — Progress Notes (Signed)
Pt tachypnic, using accessory muscles to breathe. VSS. O2 saturation 96% on 2L [place]. Will give breathing treatment per patient request.

## 2018-07-31 NOTE — Progress Notes (Signed)
eLink Physician-Brief Progress Note Patient Name: Wayne Young DOB: 11-19-1955 MRN: [id number]   Date of Service  07/31/2018  HPI/Events of Note  Hypotension - BP = 76/59 with MAP = 66?? CVP = 4. LVEF = 60-65%.  eICU Interventions  Will order: 1. Bolus with 0.9 NaCl 1 liter IV over 1 hour now.      Intervention Category Major Interventions: Hypotension - evaluation and management  Neena Beecham [place] 07/31/2018, 11:11 PM

## 2018-07-31 NOTE — Progress Notes (Signed)
SLP Cancellation Note  Patient Details Name: Wayne Young MRN: [id number] DOB: 1956-02-03   Cancelled treatment:       Reason Eval/Treat Not Completed: Medical issues which prohibited therapy. RN requests SLP defer swallow evaluation at this time as pt having difficulty breathing and pending transfer to SDU. Will follow up. RN to page if pt appropriate for swallow evaluation.  Rondel Baton, [place], CCC-SLP Speech-Language Pathologist Acute Rehabilitation Services Pager: [phone] Office: [phone]    Arlana Lindau 07/31/2018, 8:31 AM

## 2018-07-31 NOTE — Progress Notes (Signed)
Peripherally Inserted Central Catheter/Midline Placement  The IV Nurse has discussed with the patient and/or persons authorized to consent for the patient, the purpose of this procedure and the potential benefits and risks involved with this procedure.  The benefits include less needle sticks, lab draws from the catheter, and the patient may be discharged home with the catheter. Risks include, but not limited to, infection, bleeding, blood clot (thrombus formation), and puncture of an artery; nerve damage and irregular heartbeat and possibility to perform a PICC exchange if needed/ordered by physician.  Alternatives to this procedure were also discussed.  Bard Power PICC patient education guide, fact sheet on infection prevention and patient information card has been provided to patient /or left at bedside. Telephone consent obtained from dtr.      PICC/Midline Placement Documentation  PICC Double Lumen 07/31/18 PICC Right Basilic 42 cm 0 cm (Active)  Indication for Insertion or Continuance of Line Vasoactive infusions;Chronic illness with exacerbations (CF, Sickle Cell, etc.);Prolonged intravenous therapies;Limited venous access - need for IV therapy >5 days (PICC only);Poor Vasculature-patient has had multiple peripheral attempts or PIVs lasting less than 24 hours 07/31/2018  2:04 PM  Exposed Catheter (cm) 0 cm 07/31/2018  2:04 PM  Site Assessment Clean;Dry;Intact 07/31/2018  2:04 PM  Lumen #1 Status Saline locked;Flushed;Blood return noted 07/31/2018  2:04 PM  Lumen #2 Status Flushed;Saline locked;Blood return noted 07/31/2018  2:04 PM  Dressing Type Transparent 07/31/2018  2:04 PM  Dressing Status Clean;Dry;Antimicrobial disc in place;Intact 07/31/2018  2:04 PM  Line Care Connections checked and tightened 07/31/2018  2:04 PM  Line Adjustment (NICU/IV Team Only) No 07/31/2018  2:04 PM  Dressing Intervention New dressing 07/31/2018  2:04 PM  Dressing Change Due 08/07/18 07/31/2018  2:04 PM        Elliot Dally 07/31/2018, 2:05 PM

## 2018-07-31 NOTE — Progress Notes (Signed)
PROGRESS NOTE    Wayne Young  XIH:[id number] DOB: May 22, 1956 DOA: 07/25/2018 PCP: Valerie Roys, FNP   Brief Narrative: 62 year old man, history of RA and prior methotrexate, latent TB (treated), months of unexplained weight loss.  He also has presumed COPD.  Admitted with dyspnea and then respiratory failure requiring mechanical ventilation.  Treating for left lower lobe pneumonia.  He failed extubation, experienced an episode suspicious for acute seizure and was reintubated.  Empirically on Keppra.  Brain imaging and EEG were both reassuring.  He is also had CT scans of his chest abdomen and pelvis that have not revealed any malignancy or explanation for his weight loss.  He was able to be extubated successfully on 10/11. Transfer to [facility]. Patient on 10-13.    Assessment & Plan:   Active Problems:   Respiratory failure (HCC)   Protein-calorie malnutrition, severe   Recent unintentional weight loss over several months   1-Acute Hypoxic Respiratory Failure;  Admitted with Pneumonia. Intubated on admission. Subsequently extubated. Post extubation develops seizure and was intubated again. Extubation 10-11. Patient in respiratory distress, tachypnea, congested.  Continue with chest PT.  Nasal trachea suctioning.  Transfer to step down unit.  Continue with IV antibiotics.  CCM to evaluate again. High risk for intubation.  AFB negative.   2-Pneumonia; concern for aspiration.  NPO.  Continue with IV antibiotics.   3-Possible seizure;  Started on Keppra. Will continue with keppra for now.   4-AMS, acute encephalopathy; related to acute illness.   5-DM;  On lantus. SSI.   6-RA; methotrexate on hold.   Hypokalemia;  Replete     DVT prophylaxis: Lovenox Code Status: Full code. Family Communication; no family at bedside.  Disposition Plan: remain inpatient for treatment of respiratory failure.   Consultants:   CCM   Procedures:       Antimicrobials:   Zosyn 10-08   Subjective: He is SOB, congested, weak cough./ Getting CT chest therapy   Objective: Vitals:   07/31/18 0414 07/31/18 0455 07/31/18 0551 07/31/18 0722  BP: (!) 175/93     Pulse: 96     Resp: (!) 24     Temp: 97.7 F (36.5 C)     TempSrc: Oral     SpO2: 96% 94% 96% 91%  Weight:      Height:        Intake/Output Summary (Last 24 hours) at 07/31/2018 0742 Last data filed at 07/31/2018 0600 Gross per 24 hour  Intake 1036.01 ml  Output 900 ml  Net 136.01 ml   Filed Weights   07/28/18 0500 07/29/18 0500 07/30/18 0500  Weight: 59.4 kg 61.8 kg 62.7 kg    Examination:  General exam: respiratory distress, tachypnea.  Respiratory system: increase work of breathing, bilateral ronchus.  Cardiovascular system: S1 & S2 heard, RRR. No JVD, murmurs, rubs, gallops or clicks. No pedal edema. Gastrointestinal system: Abdomen is nondistended, soft and nontender. No organomegaly or masses felt. Normal bowel sounds heard. Central nervous system: anxious.  Extremities: Symmetric 5 x 5 power. Skin: No rashes, lesions or ulcers Psychiatry: anxious.   Data Reviewed: I have personally reviewed following labs and imaging studies  CBC: Recent Labs  Lab 07/25/18 1102 07/26/18 0131 07/27/18 0355 07/28/18 0341  WBC 4.4 5.3 11.2* 7.0  NEUTROABS  --   --  8.6* 5.7  HGB 17.6* 15.4 15.0 13.0  HCT 52.4* 45.6 47.5 41.2  MCV 89.6 88.5 90.0 90.5  PLT 216 143* 120* 112*   Basic Metabolic  Panel: Recent Labs  Lab 07/26/18 0131 07/27/18 0355 07/28/18 0341 07/28/18 1408 07/28/18 1847 07/29/18 0311 07/29/18 2007 07/30/18 0415  NA  --  137 143  --   --  145 144 143  K  --  3.4* 3.3*  --   --  2.9* 4.1 3.0*  CL  --  97* 108  --   --  111 107 103  CO2  --  27 28  --   --  26 29 31   GLUCOSE  --  108* 84  --   --  203* 98 107*  BUN  --  13 9  --   --  10 7* <5*  CREATININE  --  0.91 0.92  --   --  0.78 0.71 0.64  CALCIUM  --  8.7* 8.4*  --   --   8.4* 8.7* 8.8*  MG 1.8  --   --  1.8 2.0 1.9 2.2  --   PHOS 1.9* 3.8  --  1.8* 1.7* 1.7* 4.0  --    GFR: Estimated Creatinine Clearance: 82.7 mL/min (by C-G formula based on SCr of 0.64 mg/dL). Liver Function Tests: Recent Labs  Lab 07/25/18 1102  AST 21  ALT 19  ALKPHOS 46  BILITOT 1.2  PROT 7.3  ALBUMIN 3.9   Recent Labs  Lab 07/25/18 1102  LIPASE 54*   No results for input(s): AMMONIA in the last 168 hours. Coagulation Profile: No results for input(s): INR, PROTIME in the last 168 hours. Cardiac Enzymes: No results for input(s): CKTOTAL, CKMB, CKMBINDEX, TROPONINI in the last 168 hours. BNP (last 3 results) No results for input(s): PROBNP in the last 8760 hours. HbA1C: No results for input(s): HGBA1C in the last 72 hours. CBG: Recent Labs  Lab 07/30/18 0415 07/30/18 0736 07/30/18 1118 07/30/18 1620 07/30/18 2217  GLUCAP 94 85 248* 159* 90   Lipid Profile: No results for input(s): CHOL, HDL, LDLCALC, TRIG, CHOLHDL, LDLDIRECT in the last 72 hours. Thyroid Function Tests: No results for input(s): TSH, T4TOTAL, FREET4, T3FREE, THYROIDAB in the last 72 hours. Anemia Panel: No results for input(s): VITAMINB12, FOLATE, FERRITIN, TIBC, IRON, RETICCTPCT in the last 72 hours. Sepsis Labs: Recent Labs  Lab 07/26/18 1011 07/27/18 0355 07/28/18 0341  PROCALCITON 0.11 0.30 0.44    Recent Results (from the past 240 hour(s))  MRSA PCR Screening     Status: None   Collection Time: 07/25/18  5:59 PM  Result Value Ref Range Status   MRSA by PCR NEGATIVE NEGATIVE Final    Comment:        The GeneXpert MRSA Assay (FDA approved for NASAL specimens only), is one component of a comprehensive MRSA colonization surveillance program. It is not intended to diagnose MRSA infection nor to guide or monitor treatment for MRSA infections. Performed at [facility], [address], [place], [place]   Culture, blood (routine x 2)     Status: None (Preliminary  result)   Collection Time: 07/26/18  1:31 AM  Result Value Ref Range Status   Specimen Description BLOOD LEFT HAND  Final   Special Requests   Final    BOTTLES DRAWN AEROBIC ONLY Blood Culture results may not be optimal due to an inadequate volume of blood received in culture bottles   Culture   Final    NO GROWTH 4 DAYS Performed at [facility], [address], [place], [place]    Report Status PENDING  Incomplete  Culture, blood (routine x 2)     Status: None (Preliminary result)   Collection Time: 07/26/18  1:31 AM  Result Value Ref Range Status   Specimen Description BLOOD LEFT HAND  Final   Special Requests   Final    BOTTLES DRAWN AEROBIC ONLY Blood Culture results may not be optimal due to an inadequate volume of blood received in culture bottles   Culture   Final    NO GROWTH 4 DAYS Performed at [facility], [address] [place], [place] [address]    Report Status PENDING  Incomplete  Acid Fast Smear (AFB)     Status: None   Collection Time: 07/26/18 11:38 AM  Result Value Ref Range Status   AFB Specimen Processing Concentration  Final   Acid Fast Smear Negative  Final    Comment: (NOTE) Performed At: [facility] [address] [place], [place] [id number] Jolene Schimke MD AT:[id number]    Source (AFB) TRACHEAL ASPIRATE  Final    Comment: Performed at [facility], [address], [place], [place] [address]  Culture, Urine     Status: None   Collection Time: 07/26/18  7:05 PM  Result Value Ref Range Status   Specimen Description URINE, RANDOM  Final   Special Requests NONE  Final   Culture   Final    NO GROWTH Performed at [facility], [address] [place], [place] [address]    Report Status 07/28/2018 FINAL  Final         Radiology Studies: Dg Chest Port 1 View  Result Date: 07/31/2018 CLINICAL DATA:  Increased work of breathing and cough. EXAM: PORTABLE CHEST 1 VIEW COMPARISON:  07/29/2018 FINDINGS:  There is right basilar atelectasis and increased airspace opacity at the left lung base. The cardiomediastinal contours are normal. No pneumothorax or sizable pleural effusion. IMPRESSION: Left basilar airspace opacities concerning for aspiration or infection. Right basilar atelectasis. Electronically Signed   By: Deatra Robinson M.D.   On: 07/31/2018 06:40   Dg Chest Port 1 View  Result Date: 07/29/2018 CLINICAL DATA:  Lung consolidation EXAM: PORTABLE CHEST 1 VIEW COMPARISON:  07/27/2018 FINDINGS: Endotracheal tube and NG tube are unchanged. Improving aeration in the right lung base with residual atelectasis or infiltrate. Continued consolidation in the left lower lung, stable. No effusions or pneumothorax. No acute bony abnormality. IMPRESSION: Improving aeration in the right base. Continued left lower lung consolidation. Electronically Signed   By: Charlett Nose M.D.   On: 07/29/2018 10:46        Scheduled Meds: . amLODipine  10 mg Oral Daily  . brimonidine  2 drop Both Eyes TID  . dorzolamide-timolol  2 drop Both Eyes BID  . enoxaparin (LOVENOX) injection  40 mg Subcutaneous Q24H  . famotidine  20 mg Oral BID  . guaiFENesin  600 mg Oral BID  . hydrochlorothiazide  25 mg Oral Daily  . insulin aspart  0-9 Units Subcutaneous TID WC  . ipratropium-albuterol  3 mL Nebulization QID  . mouth rinse  15 mL Mouth Rinse BID   Continuous Infusions: . sodium chloride Stopped (07/30/18 0720)  . levETIRAcetam 1,000 mg (07/30/18 2234)  . piperacillin-tazobactam (ZOSYN)  IV 3.375 g (07/31/18 0104)     LOS: 6 days    Time spent: 35 minutes.     Alba Cory, MD [facility] Pager [phone]  If 7PM-7AM, please contact night-coverage [email or web address] Password [email or web address] 07/31/2018, 7:42 AM

## 2018-07-31 NOTE — Progress Notes (Signed)
Rapid response, Respiratory therapy, and NP at bedside.

## 2018-07-31 NOTE — Progress Notes (Signed)
Patient says he feels better after albuterol neb treatment. Work of breathing has seemed to improve some, but respirations still somewhat labored. O2 saturation 95% on 3L O2 via [place]. Notified respiratory therapist.

## 2018-07-31 NOTE — Progress Notes (Signed)
RT called to assess patient having increased WOB. Patient has weak cough, unable to clear secretions. RT nasotracheal suctioned patient x 2 and obtained moderate amount of thick secretions. ABG obtained and CXR pending. RN is aware.

## 2018-08-01 ENCOUNTER — Inpatient Hospital Stay (HOSPITAL_COMMUNITY): Payer: Medicaid Other

## 2018-08-01 DIAGNOSIS — J9600 Acute respiratory failure, unspecified whether with hypoxia or hypercapnia: Secondary | ICD-10-CM

## 2018-08-01 DIAGNOSIS — E876 Hypokalemia: Secondary | ICD-10-CM

## 2018-08-01 DIAGNOSIS — E43 Unspecified severe protein-calorie malnutrition: Secondary | ICD-10-CM

## 2018-08-01 LAB — POCT I-STAT 3, ART BLOOD GAS (G3+)
Acid-Base Excess: 3 mmol/L — ABNORMAL HIGH (ref 0.0–2.0)
Bicarbonate: 26.1 mmol/L (ref 20.0–28.0)
O2 Saturation: 99 %
PCO2 ART: 32.7 mmHg (ref 32.0–48.0)
PH ART: 7.51 — AB (ref 7.350–7.450)
Patient temperature: 98.1
TCO2: 27 mmol/L (ref 22–32)
pO2, Arterial: 144 mmHg — ABNORMAL HIGH (ref 83.0–108.0)

## 2018-08-01 LAB — CBC WITH DIFFERENTIAL/PLATELET
ABS IMMATURE GRANULOCYTES: 0.01 10*3/uL (ref 0.00–0.07)
ABS IMMATURE GRANULOCYTES: 0.02 10*3/uL (ref 0.00–0.07)
BASOS ABS: 0 10*3/uL (ref 0.0–0.1)
BASOS PCT: 0 %
BASOS PCT: 1 %
Basophils Absolute: 0 10*3/uL (ref 0.0–0.1)
EOS ABS: 0.1 10*3/uL (ref 0.0–0.5)
EOS ABS: 0.3 10*3/uL (ref 0.0–0.5)
EOS PCT: 6 %
Eosinophils Relative: 4 %
HCT: 15.7 % — ABNORMAL LOW (ref 39.0–52.0)
HCT: 34.8 % — ABNORMAL LOW (ref 39.0–52.0)
Hemoglobin: 4.9 g/dL — CL (ref 13.0–17.0)
Hemoglobin: 11.1 g/dL — ABNORMAL LOW (ref 13.0–17.0)
IMMATURE GRANULOCYTES: 0 %
Immature Granulocytes: 0 %
LYMPHS ABS: 0.6 10*3/uL — AB (ref 0.7–4.0)
LYMPHS PCT: 26 %
Lymphocytes Relative: 31 %
Lymphs Abs: 1.5 10*3/uL (ref 0.7–4.0)
MCH: 29.7 pg (ref 26.0–34.0)
MCH: 28.2 pg (ref 26.0–34.0)
MCHC: 31.2 g/dL (ref 30.0–36.0)
MCHC: 31.9 g/dL (ref 30.0–36.0)
MCV: 95.2 fL (ref 80.0–100.0)
MCV: 88.3 fL (ref 80.0–100.0)
MONO ABS: 0.3 10*3/uL (ref 0.1–1.0)
MONO ABS: 0.6 10*3/uL (ref 0.1–1.0)
Monocytes Relative: 12 %
Monocytes Relative: 11 %
NEUTROS ABS: 2.6 10*3/uL (ref 1.7–7.7)
Neutro Abs: 1.4 10*3/uL — ABNORMAL LOW (ref 1.7–7.7)
Neutrophils Relative %: 58 %
Neutrophils Relative %: 51 %
PLATELETS: 76 10*3/uL — AB (ref 150–400)
PLATELETS: 192 10*3/uL (ref 150–400)
RBC: 1.65 MIL/uL — AB (ref 4.22–5.81)
RBC: 3.94 MIL/uL — ABNORMAL LOW (ref 4.22–5.81)
RDW: 12.5 % (ref 11.5–15.5)
RDW: 12.1 % (ref 11.5–15.5)
WBC: 2.4 10*3/uL — ABNORMAL LOW (ref 4.0–10.5)
WBC: 5 10*3/uL (ref 4.0–10.5)
nRBC: 0 % (ref 0.0–0.2)
nRBC: 0 % (ref 0.0–0.2)

## 2018-08-01 LAB — GLUCOSE, CAPILLARY
GLUCOSE-CAPILLARY: 166 mg/dL — AB (ref 70–99)
Glucose-Capillary: 107 mg/dL — ABNORMAL HIGH (ref 70–99)
Glucose-Capillary: 115 mg/dL — ABNORMAL HIGH (ref 70–99)
Glucose-Capillary: 126 mg/dL — ABNORMAL HIGH (ref 70–99)
Glucose-Capillary: 179 mg/dL — ABNORMAL HIGH (ref 70–99)

## 2018-08-01 LAB — BASIC METABOLIC PANEL
ANION GAP: 9 (ref 5–15)
Anion gap: 14 (ref 5–15)
BUN: 13 mg/dL (ref 8–23)
BUN: 12 mg/dL (ref 8–23)
CALCIUM: 8.5 mg/dL — AB (ref 8.9–10.3)
CHLORIDE: 101 mmol/L (ref 98–111)
CO2: 24 mmol/L (ref 22–32)
CO2: 26 mmol/L (ref 22–32)
CREATININE: 1.08 mg/dL (ref 0.61–1.24)
Calcium: 8.6 mg/dL — ABNORMAL LOW (ref 8.9–10.3)
Chloride: 104 mmol/L (ref 98–111)
Creatinine, Ser: 0.78 mg/dL (ref 0.61–1.24)
Glucose, Bld: 136 mg/dL — ABNORMAL HIGH (ref 70–99)
Glucose, Bld: 172 mg/dL — ABNORMAL HIGH (ref 70–99)
POTASSIUM: 3.2 mmol/L — AB (ref 3.5–5.1)
Potassium: 2.5 mmol/L — CL (ref 3.5–5.1)
SODIUM: 139 mmol/L (ref 135–145)
SODIUM: 139 mmol/L (ref 135–145)

## 2018-08-01 LAB — VITAMIN B12: VITAMIN B 12: 693 pg/mL (ref 180–914)

## 2018-08-01 LAB — PROCALCITONIN: PROCALCITONIN: 0.22 ng/mL

## 2018-08-01 LAB — CK: Total CK: 72 U/L (ref 49–397)

## 2018-08-01 MED ORDER — POTASSIUM CHLORIDE 20 MEQ/15ML (10%) PO SOLN
40.0000 meq | ORAL | Status: AC
  Administered 2018-08-01 (×3): 40 meq
  Filled 2018-08-01 (×3): qty 30

## 2018-08-01 MED ORDER — POTASSIUM CHLORIDE 10 MEQ/50ML IV SOLN
10.0000 meq | INTRAVENOUS | Status: AC
  Administered 2018-08-01 – 2018-08-02 (×4): 10 meq via INTRAVENOUS
  Filled 2018-08-01 (×4): qty 50

## 2018-08-01 MED ORDER — VITAL HIGH PROTEIN PO LIQD: 1000.0000 mL | ORAL | Status: DC

## 2018-08-01 MED ORDER — VITAL AF 1.2 CAL PO LIQD
1000.0000 mL | ORAL | Status: DC
  Administered 2018-08-01: 1000 mL

## 2018-08-01 NOTE — Progress Notes (Addendum)
Nutrition Follow-up / Consult  DOCUMENTATION CODES:   Severe malnutrition in context of acute illness/injury, Underweight  INTERVENTION:    Vital AF 1.2 at 20 ml/h, advance slowly to goal rate of 55 mlh (1320 ml per day)  Provides 1584 kcal, 99 gm protein, 1071 ml free water daily  Monitor magnesium, potassium, and phosphorus daily for at least 3 days, MD to replete as needed, as pt is at risk for refeeding syndrome given severe PCM; potassium low today.  NUTRITION DIAGNOSIS:   Severe Malnutrition related to (acute vs chronic illness (work-up underway)) as evidenced by moderate fat depletion, severe muscle depletion, moderate muscle depletion, percent weight loss(27% weight loss within 3 months).  Ongoing  GOAL:   Patient will meet greater than or equal to 90% of their needs  Being addressed with TF  MONITOR:   Vent status, TF tolerance, Labs, I & O's  REASON FOR ASSESSMENT:   Ventilator, Consult Enteral/tube feeding initiation and management  ASSESSMENT:   62 yo male with PMH of DM, rheumatoid arthritis, HTN, positive PPD (completed treatment), and glaucoma who was admitted on 10/7 with SOB and involuntary weight loss. Intubated 10/7, extubated 10/9, but required re-intubation later that day.   Patient was extubated 10/11, but required re-intubation on 10/13. Received MD Consult for TF initiation and management. OGT in place. Whole-body imaging negative, no evidence of active TB, no clear etiology for weight loss.   Patient is currently intubated on ventilator support MV: 8.1 L/min Temp (24hrs), Avg:98.2 F (36.8 C), Min:97.7 F (36.5 C), Max:98.8 F (37.1 C)  Propofol has been stopped.  Labs reviewed. Potassium 2.5 (L) being repleted. CBG's: 107-126 Medications reviewed and include Novolog, KCl  Weight fairly stable. Fluctuating some. I/O + 9.7 L since admission.  Diet Order:   Diet Order            Diet NPO time specified  Diet effective now              EDUCATION NEEDS:   No education needs have been identified at this time  Skin:  Skin Assessment: (MASD to buttocks)  Last BM:  10/12  Height:   Ht Readings from Last 1 Encounters:  07/31/18 5\' 11"  (1.803 m)    Weight:   Wt Readings from Last 1 Encounters:  08/01/18 61.5 kg    Ideal Body Weight:  78.2 kg  BMI:  Body mass index is 18.91 kg/m.  Estimated Nutritional Needs:   Kcal:  1610  Protein:  90-100 gm  Fluid:  1.8 - 2 L    08/03/18, RD, LDN, CNSC Pager [phone] After Hours Pager [phone]

## 2018-08-01 NOTE — Progress Notes (Signed)
CRITICAL VALUE ALERT  Critical Value:  *HGB-4.9**  Date & Time Notied:  08/01/2018 4097  Provider Notified: Pola Corn  Orders Received/Actions taken:

## 2018-08-01 NOTE — Progress Notes (Signed)
PULMONARY / CRITICAL CARE MEDICINE   NAME:  Wayne Young, MRN:  [id number], DOB:  1956-08-20, LOS: 7 ADMISSION DATE:  07/25/2018,   CHIEF COMPLAINT: Dyspnea and declining mental status.  BRIEF HISTORY:        62 year old man, history of RA and prior methotrexate, latent TB (treated), months of unexplained weight loss.  He also has presumed COPD.  Admitted with dyspnea and then respiratory failure requiring mechanical ventilation.  Treating for left lower lobe pneumonia.  He failed extubation, experienced an episode suspicious for acute seizure and was reintubated.  Empirically on Keppra.  Brain imaging and EEG were both reassuring. Extubated 10/11 He was reintubated 10/13 for sudden respiratory distress and inability to clear secretions, concern for intermittent diplopia and weakness   SIGNIFICANT PAST MEDICAL HISTORY    Rheumatoid arthritis, positive PPD.   SIGNIFICANT EVENTS:   STUDIES:   CT chest angio 10/9 >> LLL consolidation  CT abd  / pelvis  10/7 >> ? SMA syndrome CT neck 10/7 >> neg MRI brain 10/10 >> chronic ischemic microangiopathy  EEG 10/10 neg  CULTURES:  No positive cultures to date  ANTIBIOTICS:  Zosyn and vancomycin added 10/7  LINES/TUBES:  ETT 10/7 >> 10/9, 10/9 >> 10/11 , 10/13 >> CONSULTANTS:   SUBJECTIVE:     Sedated, orally intubated and mechanically ventilated. Afebrile last 24 hours  CONSTITUTIONAL: BP 139/85   Pulse 80   Temp 97.7 F (36.5 C) (Axillary)   Resp 16   Ht 5\' 11"  (1.803 m)   Wt 61.5 kg   SpO2 100%   BMI 18.91 kg/m   I/O last 3 completed shifts: In: 4114.3 [P.O.:200; I.V.:1799; Other:80; NG/GT:170; IV Piggyback:1865.3] Out: 1080 [Urine:980; Other:100]  CVP:  [3 mmHg-4 mmHg] 3 mmHg  Vent Mode: PRVC FiO2 (%):  [40 %-100 %] 40 % Set Rate:  [14 bmp-16 bmp] 14 bmp Vt Set:  [600 mL] 600 mL PEEP:  [5 cmH20] 5 cmH20 Plateau Pressure:  [12 cmH20-18 cmH20] 17 cmH20  PHYSICAL EXAM: Gen. Elderly black man,, in no distress,  sedated on propofol ENT - no lesions, no post nasal drip Neck: No JVD, no thyromegaly, no carotid bruits Lungs: no use of accessory muscles, no dullness to percussion, decreased BL  without rales or rhonchi  Cardiovascular: Rhythm regular, heart sounds  normal, no murmurs or gallops, no peripheral edema Abdomen: soft and non-tender, no hepatosplenomegaly, BS normal. Musculoskeletal: No deformities, no cyanosis or clubbing Neuro:  alert, non focal    RESOLVED PROBLEM LIST   ASSESSMENT AND PLAN    Acute resp failure  -etiology unclear but will need to look for neuromuscular disease , no evidence for vocal cord paralysis during intubation,  ? recurrent aspiration.    -Ventilator settings were reviewed and adjusted. Proceed with his breathing trials but will hold off extubation -Check NIF   Protein calorie malnutrition/weight loss -whole-body imaging negative, no clear etiology apparent, no evidence of active TB Start tube feeds  Severe hypokalemia-replete, check magnesium and recheck K  Aspiration pneumonia- ct  Zosyn, low procalcitonin is reassuring Await respiratory culture Swallow evaluation once extubated SUMMARY OF TODAY'S PLAN:   We will start tube feeds, start spontaneous breathing trials and assess for extubation. Post extubation he would likely need swallow evaluation and ENT exam. We will [facility] neurology  Best Practice / Goals of Care / Disposition.   DVT PROPHYLAXIS: Lovenox SUP: Pepcid NUTRITION: TFs MOBILITY  LABS  Glucose Recent Labs  Lab 07/31/18 0741 07/31/18 0933 07/31/18 1109 07/31/18  1726 07/31/18 2203 08/01/18 0822  GLUCAP 141* 146* 115* 88 104* 107*    BMET Recent Labs  Lab 07/30/18 0415 07/31/18 0710 08/01/18 0426  NA 143 140 139  K 3.0* 2.8* 2.5*  CL 103 93* 101  CO2 31 32 24  BUN <5* <5* 13  CREATININE 0.64 0.73 1.08  GLUCOSE 107* 148* 136*    Liver Enzymes Recent Labs  Lab 07/25/18 1102  AST 21  ALT 19  ALKPHOS 46   BILITOT 1.2  ALBUMIN 3.9    Electrolytes Recent Labs  Lab 07/28/18 1847 07/29/18 0311 07/29/18 2007 07/30/18 0415 07/31/18 0710 07/31/18 0855 08/01/18 0426  CALCIUM  --  8.4* 8.7* 8.8* 9.7  --  8.5*  MG 2.0 1.9 2.2  --   --  1.8  --   PHOS 1.7* 1.7* 4.0  --   --   --   --     CBC Recent Labs  Lab 07/31/18 0710 08/01/18 0311 08/01/18 0426  WBC 7.2 2.4* 5.0  HGB 15.2 4.9* 11.1*  HCT 47.5 15.7* 34.8*  PLT 209 76* 192    ABG Recent Labs  Lab 07/31/18 0500 07/31/18 1142 08/01/18 0457  PHART 7.357 7.486* 7.510*  PCO2ART 71.5* 51.1* 32.7  PO2ART 73.4* 431.0* 144.0*    Coag's No results for input(s): APTT, INR in the last 168 hours.  Sepsis Markers Recent Labs  Lab 07/28/18 0341 07/31/18 0856 08/01/18 0426  PROCALCITON 0.44 <0.10 0.22    Cardiac Enzymes No results for input(s): TROPONINI, PROBNP in the last 168 hours.   My independent critical care time x 35m  Cyril Mourning MD. FCCP. [facility] Pager [phone] If no response call [phone]   08/01/2018

## 2018-08-01 NOTE — Progress Notes (Addendum)
..  [facility] ADULT ICU REPLACEMENT PROTOCOL FOR AM LAB REPLACEMENT ONLY  The patient does apply for the [facility] Adult ICU Electrolyte Replacment Protocol based on the criteria listed below:   1. Is GFR >/= 40 ml/min? Yes.    Patient's GFR today is>60 2. Is urine output >/= 0.5 ml/kg/hr for the last 6 hours? Yes.   Patient's UOP is 0.30ml/kg/hr 3. Is BUN < 60 mg/dL? Yes.    Patient's BUN today is 13 4. Abnormal electrolyte(s): K+2.5  5. Ordered repletion with: protocol 6. If a panic level lab has been reported, has the CCM MD in charge been notified? Yes.  .   Physician:  Dr.Sommer  Lolita Lenz 08/01/2018 5:47 AM

## 2018-08-01 NOTE — Consult Note (Signed)
NEURO HOSPITALIST CONSULT NOTE   Requestig physician: Dr. Lamonte Sakai  Reason for Consult: Progressive lower extremity weakness with shortness of breath  History obtained from:  Family and Chart     HPI:                                                                                                                                          Wayne Young is an 62 y.o. male with 27% weight loss in the past 3 months associated with severe malnutrition, who has been admitted recurrently with progressive weakness and activity limiting dyspnea for several months. He represented on 10/7 with decreased level of consciousness and hypercarbia. Family states that he first started getting weak in June, with trouble ambulating. This progressed to him only being able to get in and out of bed to go to the bathroom. Family endorses dyspnea with exertion. He also has had decrease in overall muscle bulk for several months which has been progressive. The family states that he often wakes up at night choking and holding his throat. They state that his weakness is better on waking in the morning, with progressive worsening throughout the day until it reaches a nadir in the evening. He has no prior history of neuromuscular disease. They are unclear regarding whether or not he has had diplopia or ptosis.   Past Medical History:  Diagnosis Date  . Arthritis   . Diabetes mellitus without complication ([place])   . Glaucoma   . Hypertension     Past Surgical History:  Procedure Laterality Date  . HERNIA REPAIR      Family History  Problem Relation Age of Onset  . Asthma Mother               Social History:  reports that he has quit smoking. He has never used smokeless tobacco. He reports that he does not drink alcohol or use drugs.  No Known Allergies  MEDICATIONS:                                                                                                                     Prior to  Admission:  Medications Prior to Admission  Medication Sig Dispense Refill Last Dose  . amLODipine (NORVASC) 10 MG tablet Take 10 mg  by mouth daily.    unk  . atorvastatin (LIPITOR) 20 MG tablet Take 20 mg by mouth at bedtime.  3 UNK  . brimonidine (ALPHAGAN) 0.2 % ophthalmic solution Place 2 drops into both eyes 3 (three) times daily.   07/24/2018  . dorzolamide-timolol (COSOPT) 22.3-6.8 MG/ML ophthalmic solution Place 2 drops into both eyes 2 (two) times daily.   93/04/9023  . folic acid (FOLVITE) 1 MG tablet Take by mouth.   UNK  . HUMULIN R 100 UNIT/ML injection Inject 5-6 Units into the skin See admin instructions. 5 units in the morning and 6 units at night  1 UNK  . hydrochlorothiazide (HYDRODIURIL) 25 MG tablet Take by mouth.   UNK  . insulin NPH Human (HUMULIN N,NOVOLIN N) 100 UNIT/ML injection Inject into the skin.   UNK  . metFORMIN (GLUCOPHAGE) 850 MG tablet Take by mouth.   UNK  . methotrexate (RHEUMATREX) 2.5 MG tablet Take by mouth.   UNK  . Multiple Vitamin (MULTIVITAMIN) tablet Take 1 tablet by mouth daily.   UNK  . pantoprazole (PROTONIX) 40 MG tablet Take 40 mg by mouth daily before breakfast.   2 UNK   Scheduled: . amLODipine  10 mg Per Tube Daily  . brimonidine  2 drop Both Eyes TID  . chlorhexidine gluconate (MEDLINE KIT)  15 mL Mouth Rinse BID  . Chlorhexidine Gluconate Cloth  6 each Topical Daily  . dorzolamide-timolol  2 drop Both Eyes BID  . enoxaparin (LOVENOX) injection  40 mg Subcutaneous Q24H  . famotidine  20 mg Per Tube BID  . guaiFENesin  10 mL Oral Q4H  . insulin aspart  0-9 Units Subcutaneous TID WC  . ipratropium-albuterol  3 mL Nebulization QID  . mouth rinse  15 mL Mouth Rinse 10 times per day  . midazolam  2 mg Intravenous Once  . sodium chloride flush  10-40 mL Intracatheter Q12H   Continuous: . sodium chloride Stopped (07/30/18 0720)  . feeding supplement (VITAL AF 1.2 CAL) 1,000 mL (08/01/18 1213)  . lactated ringers 50 mL/hr at 08/01/18 1300   . levETIRAcetam Stopped (08/01/18 1231)  . piperacillin-tazobactam (ZOSYN)  IV 3.375 g (08/01/18 1449)  . propofol (DIPRIVAN) infusion Stopped (08/01/18 1009)     ROS:                                                                                                                                       Unable to obtain due to AMS.    Blood pressure 135/74, pulse 84, temperature 99.5 F (37.5 C), temperature source Oral, resp. rate 14, height 5' 11"  (1.803 m), weight 61.5 kg, SpO2 100 %.   General Examination:  Physical Exam  HEENT-  [place]/AT. Intubated.   Extremities- Warm and well perfused. Decreased muscle bulk x 4 without asymmetry.   Neurological Examination Mental Status: Somnolent to obtunded. Follows only about 10% of commands. Will briefly track nurse to left and right on request. Requires continual stimulation to maintain sufficient alertness to participate minimally with exam. Unable to assess verbal output. Will nod head when asked if he can feel fine touch to upper and lower extremities.  Cranial Nerves: II: PERRL. Blinks to threat in right and left visual fields. Has difficulty tracking.   III,IV, VI: Coarsely saccadic visual pursuits. No nystagmus. Unclear if there is intermittent bilateral ptosis or if patient is recurrently entering an asleep state - the latter appears more likely. Right exotropia noted.  V,VII: Face flaccidly symmetric. Unable to assess facial sensation in detail due to AMS.  VIII: hearing intact to some commands IX,X: Intubated  XI: Head at midline with decreased nuchal tone XII: Intubated Motor: Moves upper extremities weakly to command. Able to raise antigravity and resist examiner with 4-/5 strength, without clear asymmetry. Weakness present proximally and distally in association with diffusely decreased muscle bulk.  Moves lower extremities weakly to  command and more briskly withdraws with 4-/5 strength to noxious. The strength deficit in lower ext is proximal = distal.  Sensory: Will nod head when asked if he can feel fine touch to upper and lower extremities. Withdraws BLE to plantar stimulation.  Deep Tendon Reflexes: 1+ right biceps and brachioradialis. Intermittently elicitable 1+ left brachioradialis and biceps in the setting of mild LUE edema. 1+ bilateral patellar reflexes. 0 achilles bilaterally.  Plantars: Mute bilaterally.  Cerebellar/Gait: Unable to assess.     Lab Results: Basic Metabolic Panel: Recent Labs  Lab 07/27/18 0355  07/28/18 1408 07/28/18 1847 07/29/18 0311 07/29/18 2007 07/30/18 0415 07/31/18 0710 07/31/18 0855 08/01/18 0426  NA 137   < >  --   --  145 144 143 140  --  139  K 3.4*   < >  --   --  2.9* 4.1 3.0* 2.8*  --  2.5*  CL 97*   < >  --   --  111 107 103 93*  --  101  CO2 27   < >  --   --  26 29 31  32  --  24  GLUCOSE 108*   < >  --   --  203* 98 107* 148*  --  136*  BUN 13   < >  --   --  10 7* <5* <5*  --  13  CREATININE 0.91   < >  --   --  0.78 0.71 0.64 0.73  --  1.08  CALCIUM 8.7*   < >  --   --  8.4* 8.7* 8.8* 9.7  --  8.5*  MG  --   --  1.8 2.0 1.9 2.2  --   --  1.8  --   PHOS 3.8  --  1.8* 1.7* 1.7* 4.0  --   --   --   --    < > = values in this interval not displayed.    CBC: Recent Labs  Lab 07/27/18 0355 07/28/18 0341 07/31/18 0710 08/01/18 0311 08/01/18 0426  WBC 11.2* 7.0 7.2 2.4* 5.0  NEUTROABS 8.6* 5.7  --  1.4* 2.6  HGB 15.0 13.0 15.2 4.9* 11.1*  HCT 47.5 41.2 47.5 15.7* 34.8*  MCV 90.0 90.5 89.3 95.2 88.3  PLT 120*  112* 209 76* 192    Cardiac Enzymes: No results for input(s): CKTOTAL, CKMB, CKMBINDEX, TROPONINI in the last 168 hours.  Lipid Panel: Recent Labs  Lab 07/31/18 0710  TRIG 164*    Imaging: Dg Chest Port 1 View  Result Date: 08/01/2018 CLINICAL DATA:  Aspiration EXAM: PORTABLE CHEST 1 VIEW COMPARISON:  07/31/2018; 07/29/2018; 07/25/2018;  chest CT-07/27/2016 FINDINGS: Grossly unchanged cardiac silhouette and mediastinal contours. Interval placement of a right upper extremity approach PICC line with tip projected over the distal SVC. Otherwise, stable position of support apparatus. No pneumothorax. Improved aeration the right lung base. Left basilar heterogeneous opacities are unchanged. No new focal airspace opacities. No evidence of edema. No pneumothorax. No acute osseus abnormalities. IMPRESSION: 1. Right upper extremity approach PICC line tip projects over the distal SVC. 2. Otherwise, stable position of support apparatus. No pneumothorax. 3. Improved aeration of the right lung base with persistent left basilar opacities, atelectasis versus infiltrate. Electronically Signed   By: Sandi Mariscal M.D.   On: 08/01/2018 07:20   Dg Chest Port 1 View  Result Date: 07/31/2018 CLINICAL DATA:  Xray for intubation and og tube placement. EXAM: PORTABLE CHEST 1 VIEW COMPARISON:  Chest x-ray from earlier same day. FINDINGS: Endotracheal tube is well positioned with tip approximately 4 cm above the carina. Enteric tube passes below the diaphragm. Heart size and mediastinal contours are within normal limits. Subtle bibasilar opacities again. Lungs otherwise clear. No pneumothorax seen. IMPRESSION: 1. Endotracheal tube is well positioned with tip approximately 4 cm above the carina. 2. Enteric tube passes below the diaphragm. 3. Subtle bibasilar opacities which could represent atelectasis, aspiration and/or small pleural effusions. Electronically Signed   By: Franki Cabot M.D.   On: 07/31/2018 11:04   Dg Chest Port 1 View  Result Date: 07/31/2018 CLINICAL DATA:  Increased work of breathing and cough. EXAM: PORTABLE CHEST 1 VIEW COMPARISON:  07/29/2018 FINDINGS: There is right basilar atelectasis and increased airspace opacity at the left lung base. The cardiomediastinal contours are normal. No pneumothorax or sizable pleural effusion. IMPRESSION: Left  basilar airspace opacities concerning for aspiration or infection. Right basilar atelectasis. Electronically Signed   By: Ulyses Jarred M.D.   On: 07/31/2018 06:40   [place] Ekg Site Rite  Result Date: 07/31/2018 If Site Rite image not attached, placement could not be confirmed due to current cardiac rhythm.   Assessment: 62 year old male with progressive weakness in conjunction with severe weight loss 1. Exam compromised by inability of patient to fully cooperate in the setting of somnolent to obtunded state.  2. Recent MRI brain on 10/10 showed chronic ischemic microangiopathy without acute intracranial abnormality. The MRI does not explain the patient's presentation.  3. Localization based on exam findings also not definitive. Weakness may be secondary to progressive myelopathy (subacute myelopathy due to B12 or copper deficiency) versus a myopathy, neuromuscular junction disorder or a polyradiculoneuropathy.   Recommendations: 1. Will need a fluoro guided LP to assess for oligoclonal bands, protein, glucose, cell count with differential. Due to obtundation this will need to be performed under fluoroscopy.  2. CK level (ordered).  3. Myasthenia gravis panel (ordered) 4. MRI of cervical and thoracic spine to assess for possible transverse myelitis or selective dorsal and lateral column longitudinal lesion as may be seen with B12 or copper deficiency (severe weight loss/malnutrition) 5. Thiamine deficiency could result in AMS and a polyneuropathy. Thiamine level has been ordered.   45 minutes spent in the neurological assessment of this  critically ill patient  Electronically signed: Dr. Kerney Elbe 08/01/2018, 4:46 PM

## 2018-08-01 NOTE — Progress Notes (Signed)
SLP Cancellation Note  Patient Details Name: Wayne Young MRN: [id number] DOB: 05-03-1956   Cancelled treatment:       Reason Eval/Treat Not Completed: Patient not medically ready. Will sign off at this time, please reorder when appropriate   Michaelene Dutan, Riley Nearing 08/01/2018, 7:30 AM

## 2018-08-01 NOTE — Progress Notes (Signed)
eLink Physician-Brief Progress Note Patient Name: Shahan Starks DOB: 10-Sep-1956 MRN: [id number]   Date of Service  08/01/2018  HPI/Events of Note  K+ = 3.2 and Creatinine = 0.78.  eICU Interventions  Will replace K+.      Intervention Category Major Interventions: Electrolyte abnormality - evaluation and management  Sommer,Steven Eugene 08/01/2018, 10:01 PM

## 2018-08-02 ENCOUNTER — Inpatient Hospital Stay (HOSPITAL_COMMUNITY): Payer: Medicaid Other

## 2018-08-02 DIAGNOSIS — J189 Pneumonia, unspecified organism: Secondary | ICD-10-CM

## 2018-08-02 LAB — CBC
HCT: 35.4 % — ABNORMAL LOW (ref 39.0–52.0)
Hemoglobin: 11.8 g/dL — ABNORMAL LOW (ref 13.0–17.0)
MCH: 29.3 pg (ref 26.0–34.0)
MCHC: 33.3 g/dL (ref 30.0–36.0)
MCV: 87.8 fL (ref 80.0–100.0)
Platelets: 208 10*3/uL (ref 150–400)
RBC: 4.03 MIL/uL — AB (ref 4.22–5.81)
RDW: 12.4 % (ref 11.5–15.5)
WBC: 4.6 10*3/uL (ref 4.0–10.5)
nRBC: 0 % (ref 0.0–0.2)

## 2018-08-02 LAB — GLUCOSE, CAPILLARY
GLUCOSE-CAPILLARY: 159 mg/dL — AB (ref 70–99)
GLUCOSE-CAPILLARY: 182 mg/dL — AB (ref 70–99)
Glucose-Capillary: 145 mg/dL — ABNORMAL HIGH (ref 70–99)
Glucose-Capillary: 175 mg/dL — ABNORMAL HIGH (ref 70–99)
Glucose-Capillary: 152 mg/dL — ABNORMAL HIGH (ref 70–99)

## 2018-08-02 LAB — BASIC METABOLIC PANEL
ANION GAP: 8 (ref 5–15)
BUN: 10 mg/dL (ref 8–23)
CALCIUM: 8.6 mg/dL — AB (ref 8.9–10.3)
CO2: 28 mmol/L (ref 22–32)
Chloride: 104 mmol/L (ref 98–111)
Creatinine, Ser: 0.69 mg/dL (ref 0.61–1.24)
GFR calc Af Amer: >60 mL/min (ref >60)
GLUCOSE: 163 mg/dL — AB (ref 70–99)
Potassium: 3.2 mmol/L — ABNORMAL LOW (ref 3.5–5.1)
Sodium: 140 mmol/L (ref 135–145)

## 2018-08-02 LAB — PHOSPHORUS: Phosphorus: 1.6 mg/dL — ABNORMAL LOW (ref 2.5–4.6)

## 2018-08-02 LAB — MAGNESIUM: MAGNESIUM: 1.7 mg/dL (ref 1.7–2.4)

## 2018-08-02 LAB — PROCALCITONIN: PROCALCITONIN: 0.13 ng/mL

## 2018-08-02 MED ORDER — VITAL AF 1.2 CAL PO LIQD
1000.0000 mL | ORAL | Status: DC
  Administered 2018-08-03 – 2018-08-05 (×2): 1000 mL

## 2018-08-02 MED ORDER — VITAL AF 1.2 CAL PO LIQD
1000.0000 mL | ORAL | Status: DC
  Administered 2018-08-02: 1000 mL

## 2018-08-02 MED ORDER — INSULIN ASPART 100 UNIT/ML ~~LOC~~ SOLN
0.0000 [IU] | SUBCUTANEOUS | Status: DC
  Administered 2018-08-02 (×2): 2 [IU] via SUBCUTANEOUS
  Administered 2018-08-02: 1 [IU] via SUBCUTANEOUS
  Administered 2018-08-02 – 2018-08-03 (×7): 2 [IU] via SUBCUTANEOUS
  Administered 2018-08-03: 1 [IU] via SUBCUTANEOUS
  Administered 2018-08-04: 2 [IU] via SUBCUTANEOUS
  Administered 2018-08-04 (×2): 1 [IU] via SUBCUTANEOUS
  Administered 2018-08-04: 3 [IU] via SUBCUTANEOUS
  Administered 2018-08-04 – 2018-08-06 (×9): 2 [IU] via SUBCUTANEOUS
  Administered 2018-08-06: 3 [IU] via SUBCUTANEOUS
  Administered 2018-08-06 (×2): 2 [IU] via SUBCUTANEOUS
  Administered 2018-08-06: 3 [IU] via SUBCUTANEOUS
  Administered 2018-08-06: 2 [IU] via SUBCUTANEOUS
  Administered 2018-08-07: 1 [IU] via SUBCUTANEOUS
  Administered 2018-08-07: 3 [IU] via SUBCUTANEOUS
  Administered 2018-08-07: 2 [IU] via SUBCUTANEOUS
  Administered 2018-08-07: 3 [IU] via SUBCUTANEOUS
  Administered 2018-08-07: 2 [IU] via SUBCUTANEOUS
  Administered 2018-08-07: 3 [IU] via SUBCUTANEOUS
  Administered 2018-08-08: 2 [IU] via SUBCUTANEOUS
  Administered 2018-08-08: 1 [IU] via SUBCUTANEOUS
  Administered 2018-08-08 (×4): 2 [IU] via SUBCUTANEOUS
  Administered 2018-08-09: 3 [IU] via SUBCUTANEOUS
  Administered 2018-08-09 (×3): 2 [IU] via SUBCUTANEOUS
  Administered 2018-08-09: 1 [IU] via SUBCUTANEOUS
  Administered 2018-08-09 – 2018-08-10 (×2): 3 [IU] via SUBCUTANEOUS
  Administered 2018-08-10: 2 [IU] via SUBCUTANEOUS
  Administered 2018-08-10: 3 [IU] via SUBCUTANEOUS
  Administered 2018-08-10 (×3): 2 [IU] via SUBCUTANEOUS
  Administered 2018-08-11 (×2): 1 [IU] via SUBCUTANEOUS
  Administered 2018-08-11 (×3): 2 [IU] via SUBCUTANEOUS
  Administered 2018-08-12: 1 [IU] via SUBCUTANEOUS
  Administered 2018-08-12: 5 [IU] via SUBCUTANEOUS
  Administered 2018-08-12: 1 [IU] via SUBCUTANEOUS
  Administered 2018-08-12: 2 [IU] via SUBCUTANEOUS
  Administered 2018-08-13: 3 [IU] via SUBCUTANEOUS
  Administered 2018-08-13: 2 [IU] via SUBCUTANEOUS
  Administered 2018-08-13: 3 [IU] via SUBCUTANEOUS
  Administered 2018-08-13: 5 [IU] via SUBCUTANEOUS
  Administered 2018-08-13: 3 [IU] via SUBCUTANEOUS
  Administered 2018-08-13: 2 [IU] via SUBCUTANEOUS
  Administered 2018-08-14: 3 [IU] via SUBCUTANEOUS
  Administered 2018-08-14 (×2): 5 [IU] via SUBCUTANEOUS
  Administered 2018-08-14 (×3): 3 [IU] via SUBCUTANEOUS
  Administered 2018-08-15: 2 [IU] via SUBCUTANEOUS
  Administered 2018-08-15: 3 [IU] via SUBCUTANEOUS
  Administered 2018-08-15: 2 [IU] via SUBCUTANEOUS
  Administered 2018-08-15: 3 [IU] via SUBCUTANEOUS
  Administered 2018-08-15: 5 [IU] via SUBCUTANEOUS
  Administered 2018-08-15 (×2): 2 [IU] via SUBCUTANEOUS
  Administered 2018-08-16: 3 [IU] via SUBCUTANEOUS
  Administered 2018-08-16 (×3): 1 [IU] via SUBCUTANEOUS
  Administered 2018-08-16 – 2018-08-17 (×6): 3 [IU] via SUBCUTANEOUS
  Administered 2018-08-17 – 2018-08-18 (×2): 2 [IU] via SUBCUTANEOUS
  Administered 2018-08-18: 1 [IU] via SUBCUTANEOUS
  Administered 2018-08-18 – 2018-08-19 (×3): 2 [IU] via SUBCUTANEOUS
  Administered 2018-08-19: 3 [IU] via SUBCUTANEOUS
  Administered 2018-08-19 (×2): 2 [IU] via SUBCUTANEOUS
  Administered 2018-08-19: 1 [IU] via SUBCUTANEOUS
  Administered 2018-08-19: 2 [IU] via SUBCUTANEOUS
  Administered 2018-08-19 – 2018-08-20 (×2): 3 [IU] via SUBCUTANEOUS
  Administered 2018-08-20 (×4): 2 [IU] via SUBCUTANEOUS

## 2018-08-02 MED ORDER — POTASSIUM PHOSPHATES 15 MMOLE/5ML IV SOLN
30.0000 mmol | Freq: Once | INTRAVENOUS | Status: AC
  Administered 2018-08-02: 30 mmol via INTRAVENOUS
  Filled 2018-08-02: qty 10

## 2018-08-02 NOTE — Progress Notes (Signed)
62 year old with a history of rheumatoid arthritis and unexplained weight loss admitted 10/7 with dyspnea and left lower lobe pneumonia requiring mechanical ventilation.  He was extubated on 10/9 but soon reintubated due to suspected seizure episode and altered mental status.  He was extubated 10/11 but then reintubated 10/13 for sudden respiratory distress.  This time there was some concern for diplopia and weakness and a neuromuscular cause was felt possible.  We involved neurology and MRI was obtained which showed mild central disc protrusion C4-6 but no cord compression and no demyelinating lesions.  Acetylcholinesterase antibodies have been sent and are related.  On exam today-he is more awake and follows commands, power 3-4/5 all 4 extremities, S1-S2 normal, decreased breath sounds on left, no edema, appears weak and frail. He is febrile  Chest x-ray shows new left lower lobe airspace disease, personally reviewed  Labs show hypokalemia and hypophosphatemia with low procalcitonin.  Impression/plan  Recurrent acute respiratory failure-this time seems to be related to aspiration, vocal cords noted to be normal, concern for neuromuscular cause underlying. We will continue spontaneous breathing trials and would like to see a trial of T-piece before extubating again.  Unfortunately if he fails again will need tracheostomy He will certainly need swallow evaluation once extubated  HCAP/aspiration pneumonia -with new left lower lobe infiltrate, respiratory culture showing few gram-negative rods, continue Zosyn  Hypokalemia/hypophosphatemia will be repleted.  Protein calorie malnutrition-titrate tube feeds to goal per nutrition  Wife updated bedside  My critical care time x 93m  Cyril Mourning MD. [facility]. [place] Pager [phone] If no response call [phone]   08/02/2018

## 2018-08-02 NOTE — Progress Notes (Signed)
Transported patient to MRI while on the ventilator. Patient remained stable during MRI procedure.

## 2018-08-02 NOTE — Progress Notes (Addendum)
Nutrition Follow-up  DOCUMENTATION CODES:   Severe malnutrition in context of acute illness/injury, Underweight  INTERVENTION:    Increase Vital AF 1.2 by 10 ml every 8 hours to goal rate of 55 ml/h (1320 ml per day)   Provides 1584 kcal, 99 gm protein, 1071 ml free water daily   Monitor magnesium, potassium, and phosphorus daily for at least 3 days, MD to replete as needed, as pt is at risk for refeeding syndrome given severe PCM.  NUTRITION DIAGNOSIS:   Severe Malnutrition related to (acute vs chronic illness (work-up underway)) as evidenced by moderate fat depletion, severe muscle depletion, moderate muscle depletion, percent weight loss(27% weight loss within 3 months).  Ongoing  GOAL:   Patient will meet greater than or equal to 90% of their needs  Being addressed with TF  MONITOR:   Vent status, TF tolerance, Labs, I & O's  ASSESSMENT:   62 yo male with PMH of DM, rheumatoid arthritis, HTN, positive PPD (completed treatment), and glaucoma who was admitted on 10/7 with SOB and involuntary weight loss. Intubated 10/7, extubated 10/9, but required re-intubation later that day.   Discussed patient with RN and MD. Molli Knock to slowly increase TF to goal rate. Low potassium and phosphorus likely related to refeeding syndrome. Both are being repleted.   Patient remains intubated on ventilator support MV: 9.2 L/min Temp (24hrs), Avg:99.2 F (37.3 C), Min:97.9 F (36.6 C), Max:100.2 F (37.9 C)   Labs reviewed. Potassium 3.2 (L), phosphorus 1.6 (L) CBG's: 159-145-175 Medications reviewed and include novolog, potassium phosphate.  Weight trending up.  I/O +10.8 L since admission    Diet Order:   Diet Order            Diet NPO time specified  Diet effective now              EDUCATION NEEDS:   No education needs have been identified at this time  Skin:  Skin Assessment: (MASD to buttocks)  Last BM:  10/12  Height:   Ht Readings from Last 1 Encounters:   07/31/18 5\' 11"  (1.803 m)    Weight:   Wt Readings from Last 1 Encounters:  08/02/18 63.8 kg    Ideal Body Weight:  78.2 kg  BMI:  Body mass index is 19.62 kg/m.  Estimated Nutritional Needs:   Kcal:  1610  Protein:  90-100 gm  Fluid:  1.8 - 2 L    08/04/18, RD, LDN, CNSC Pager [phone] After Hours Pager [phone]

## 2018-08-02 NOTE — Progress Notes (Signed)
Pt placed back on full vent support due to decreasing O2 sat at 83%.  RT attempted to bag lavage pt with minimal secretions achieved.  On full support sat improved to 93-92%.

## 2018-08-02 NOTE — Progress Notes (Signed)
Subjective: MRI cervical and thoracic spine completed yesterday.   Objective: Current vital signs: BP (!) 147/88   Pulse 83   Temp 97.9 F (36.6 C) (Oral)   Resp 14   Ht 5' 11"  (1.803 m)   Wt 63.8 kg   SpO2 97%   BMI 19.62 kg/m  Vital signs in last 24 hours: Temp:  [97.9 F (36.6 C)-100.2 F (37.9 C)] 97.9 F (36.6 C) (10/14 2342) Pulse Rate:  [71-94] 83 (10/15 0300) Resp:  [14-16] 14 (10/14 1525) BP: (112-160)/(73-92) 147/88 (10/15 0600) SpO2:  [95 %-100 %] 97 % (10/15 0540) FiO2 (%):  [40 %] 40 % (10/15 0540) Weight:  [63.8 kg] 63.8 kg (10/15 0500)  Intake/Output from previous day: 10/14 0701 - 10/15 0700 In: 2375.1 [I.V.:1268.3; NG/GT:570.7; IV Piggyback:536.2] Out: 0272 [ZDGUY:403; Emesis/NG output:275] Intake/Output this shift: Total I/O In: 35 [NG/GT:35] Out: -  Nutritional status:  Diet Order            Diet NPO time specified  Diet effective now             HEENT: [place]/AT Lungs: Intubated Ext: Decreased muscle bulk x 4  Neurological Examination Mental Status: Awake and alert. Follows all commands and answers questions by nodding/shaking head. Significantly improved since yesterday.  Cranial Nerves: II: Pupils reactive bilaterally. Irregular pupil on the left (history of eye operations)  III,IV, VI: EOMI without nystagmus. Able to bury sclerae on horizontal gaze bilaterally. No ptosis. Mild right exotropia noted; no double vision with EOM.  VII: No gross asymmetry in the context of intubation.  VIII: hearing intact to questions and commands IX,X: Intubated  XI: Head at midline. Rotates to R and L normally.  XII: Intubated Motor: Symmetrically decreased muscle bulk proximally and distally bilateral upper and lower extremities.  Upper extremities: Able to raise antigravity and resist examiner with 4/5 strength, without asymmetry.   4/5 bilateral lower extremity strength proximally and distally.  Sensory: Unremarkable  Deep Tendon Reflexes: 2+ right  biceps and brachioradialis. 1+ left brachioradialis and biceps in the setting of mild LUE edema. 2+ bilateral patellar reflexes. 1+ achilles bilaterally.  Cerebellar/Gait: Unable to assess.   Lab Results: Results for orders placed or performed during the hospital encounter of 07/25/18 (from the past 48 hour(s))  Glucose, capillary     Status: Abnormal   Collection Time: 07/31/18  7:41 AM  Result Value Ref Range   Glucose-Capillary 141 (H) 70 - 99 mg/dL  Magnesium     Status: None   Collection Time: 07/31/18  8:55 AM  Result Value Ref Range   Magnesium 1.8 1.7 - 2.4 mg/dL    Comment: Performed at [facility] [address] [place], [place] [address]  Procalcitonin - Baseline     Status: None   Collection Time: 07/31/18  8:56 AM  Result Value Ref Range   Procalcitonin <0.10 ng/mL    Comment:        Interpretation: PCT (Procalcitonin) <= 0.5 ng/mL: Systemic infection (sepsis) is not likely. Local bacterial infection is possible. (NOTE)       Sepsis PCT Algorithm           Lower Respiratory Tract                                      Infection PCT Algorithm    ----------------------------     ----------------------------  PCT < 0.25 ng/mL                PCT < 0.10 ng/mL         Strongly encourage             Strongly discourage   discontinuation of antibiotics    initiation of antibiotics    ----------------------------     -----------------------------       PCT 0.25 - 0.50 ng/mL            PCT 0.10 - 0.25 ng/mL               OR       >80% decrease in PCT            Discourage initiation of                                            antibiotics      Encourage discontinuation           of antibiotics    ----------------------------     -----------------------------         PCT >= 0.50 ng/mL              PCT 0.26 - 0.50 ng/mL               AND        <80% decrease in PCT             Encourage initiation of                                             antibiotics        Encourage continuation           of antibiotics    ----------------------------     -----------------------------        PCT >= 0.50 ng/mL                  PCT > 0.50 ng/mL               AND         increase in PCT                  Strongly encourage                                      initiation of antibiotics    Strongly encourage escalation           of antibiotics                                     -----------------------------                                           PCT <= 0.25 ng/mL  OR                                        > 80% decrease in PCT                                     Discontinue / Do not initiate                                             antibiotics Performed at [facility], [place] [address] [place], [place] [address]   Glucose, capillary     Status: Abnormal   Collection Time: 07/31/18  9:33 AM  Result Value Ref Range   Glucose-Capillary 146 (H) 70 - 99 mg/dL   Comment 1 Capillary Specimen    Comment 2 Notify RN   Glucose, capillary     Status: Abnormal   Collection Time: 07/31/18 11:09 AM  Result Value Ref Range   Glucose-Capillary 115 (H) 70 - 99 mg/dL   Comment 1 Capillary Specimen    Comment 2 Notify RN   I-STAT 3, arterial blood gas (G3+)     Status: Abnormal   Collection Time: 07/31/18 11:42 AM  Result Value Ref Range   pH, Arterial 7.486 (H) 7.350 - 7.450   pCO2 arterial 51.1 (H) 32.0 - 48.0 mmHg   pO2, Arterial 431.0 (H) 83.0 - 108.0 mmHg   Bicarbonate 38.7 (H) 20.0 - 28.0 mmol/L   TCO2 40 (H) 22 - 32 mmol/L   O2 Saturation 100.0 %   Acid-Base Excess 13.0 (H) 0.0 - 2.0 mmol/L   Patient temperature 98.0 F    Collection site RADIAL, ALLEN'S TEST ACCEPTABLE    Drawn by RT    Sample type ARTERIAL   Glucose, capillary     Status: None   Collection Time: 07/31/18  5:26 PM  Result Value Ref Range   Glucose-Capillary 88 70 - 99 mg/dL   Comment 1 Capillary Specimen   Glucose, capillary      Status: Abnormal   Collection Time: 07/31/18 10:03 PM  Result Value Ref Range   Glucose-Capillary 104 (H) 70 - 99 mg/dL   Comment 1 Notify RN   CBC with Differential/Platelet     Status: Abnormal   Collection Time: 08/01/18  3:11 AM  Result Value Ref Range   WBC 2.4 (L) 4.0 - 10.5 K/uL   RBC 1.65 (L) 4.22 - 5.81 MIL/uL   Hemoglobin 4.9 (LL) 13.0 - 17.0 g/dL    Comment: This result has been called to [facility].ELIAS,RN by Vinie Sill on 10 14 2019 at 0336, and has been read back. Critical result verified REPEATED TO VERIFY QUESTIONABLE RESULTS, RECOMMEND RECOLLECT TO VERIFY CORRECTED ON 10/14 AT 2037: PREVIOUSLY REPORTED AS 4.9 This result has been called to M.ELIAS,RN by Vinie Sill on 10 14 2019 at 0336, and has been read back. Critical result verified    HCT 15.7 (L) 39.0 - 52.0 %   MCV 95.2 80.0 - 100.0 fL   MCH 29.7 26.0 - 34.0 pg   MCHC 31.2 30.0 - 36.0 g/dL   RDW 12.5 11.5 - 15.5 %   Platelets 76 (L) 150 - 400 K/uL    Comment:  REPEATED TO VERIFY PLATELET COUNT CONFIRMED BY SMEAR SPECIMEN CHECKED FOR CLOTS Immature Platelet Fraction may be clinically indicated, consider ordering this additional test [id number]    nRBC 0.0 0.0 - 0.2 %   Neutrophils Relative % 58 %   Neutro Abs 1.4 (L) 1.7 - 7.7 K/uL   Lymphocytes Relative 26 %   Lymphs Abs 0.6 (L) 0.7 - 4.0 K/uL   Monocytes Relative 12 %   Monocytes Absolute 0.3 0.1 - 1.0 K/uL   Eosinophils Relative 4 %   Eosinophils Absolute 0.1 0.0 - 0.5 K/uL   Basophils Relative 0 %   Basophils Absolute 0.0 0.0 - 0.1 K/uL   Immature Granulocytes 0 %   Abs Immature Granulocytes 0.01 0.00 - 0.07 K/uL    Comment: Performed at [facility], [address], [place], [place] [address]  Basic metabolic panel     Status: Abnormal   Collection Time: 08/01/18  4:26 AM  Result Value Ref Range   Sodium 139 135 - 145 mmol/L   Potassium 2.5 (LL) 3.5 - 5.1 mmol/L    Comment: CRITICAL RESULT CALLED TO, READ BACK BY AND VERIFIED  WITH: ELIAS M,RN 08/01/18 0517 WAYK    Chloride 101 98 - 111 mmol/L   CO2 24 22 - 32 mmol/L   Glucose, Bld 136 (H) 70 - 99 mg/dL   BUN 13 8 - 23 mg/dL   Creatinine, Ser 1.08 0.61 - 1.24 mg/dL   Calcium 8.5 (L) 8.9 - 10.3 mg/dL   GFR calc non Af Amer >60 >60 mL/min   GFR calc Af Amer >60 >60 mL/min    Comment: (NOTE) The eGFR has been calculated using the CKD EPI equation. This calculation has not been validated in all clinical situations. eGFR's persistently <60 mL/min signify possible Chronic Kidney Disease.    Anion gap 14 5 - 15    Comment: Performed at [facility] [address], [place], [place] [address]  Procalcitonin     Status: None   Collection Time: 08/01/18  4:26 AM  Result Value Ref Range   Procalcitonin 0.22 ng/mL    Comment:        Interpretation: PCT (Procalcitonin) <= 0.5 ng/mL: Systemic infection (sepsis) is not likely. Local bacterial infection is possible. (NOTE)       Sepsis PCT Algorithm           Lower Respiratory Tract                                      Infection PCT Algorithm    ----------------------------     ----------------------------         PCT < 0.25 ng/mL                PCT < 0.10 ng/mL         Strongly encourage             Strongly discourage   discontinuation of antibiotics    initiation of antibiotics    ----------------------------     -----------------------------       PCT 0.25 - 0.50 ng/mL            PCT 0.10 - 0.25 ng/mL               OR       >80% decrease in PCT  Discourage initiation of                                            antibiotics      Encourage discontinuation           of antibiotics    ----------------------------     -----------------------------         PCT >= 0.50 ng/mL              PCT 0.26 - 0.50 ng/mL               AND        <80% decrease in PCT             Encourage initiation of                                             antibiotics       Encourage continuation           of  antibiotics    ----------------------------     -----------------------------        PCT >= 0.50 ng/mL                  PCT > 0.50 ng/mL               AND         increase in PCT                  Strongly encourage                                      initiation of antibiotics    Strongly encourage escalation           of antibiotics                                     -----------------------------                                           PCT <= 0.25 ng/mL                                                 OR                                        > 80% decrease in PCT                                     Discontinue / Do not initiate  antibiotics Performed at [facility], [place] [address], [place], [place] [address]   CBC with Differential/Platelet     Status: Abnormal   Collection Time: 08/01/18  4:26 AM  Result Value Ref Range   WBC 5.0 4.0 - 10.5 K/uL   RBC 3.94 (L) 4.22 - 5.81 MIL/uL   Hemoglobin 11.1 (L) 13.0 - 17.0 g/dL    Comment: REPEATED TO VERIFY NEW SAMPLE    HCT 34.8 (L) 39.0 - 52.0 %   MCV 88.3 80.0 - 100.0 fL    Comment: REPEATED TO VERIFY NEW SAMPLE    MCH 28.2 26.0 - 34.0 pg   MCHC 31.9 30.0 - 36.0 g/dL   RDW 12.1 11.5 - 15.5 %   Platelets 192 150 - 400 K/uL    Comment: REPEATED TO VERIFY NEW SAMPLE    nRBC 0.0 0.0 - 0.2 %   Neutrophils Relative % 51 %   Neutro Abs 2.6 1.7 - 7.7 K/uL   Lymphocytes Relative 31 %   Lymphs Abs 1.5 0.7 - 4.0 K/uL   Monocytes Relative 11 %   Monocytes Absolute 0.6 0.1 - 1.0 K/uL   Eosinophils Relative 6 %   Eosinophils Absolute 0.3 0.0 - 0.5 K/uL   Basophils Relative 1 %   Basophils Absolute 0.0 0.0 - 0.1 K/uL   Immature Granulocytes 0 %   Abs Immature Granulocytes 0.02 0.00 - 0.07 K/uL    Comment: Performed at [facility], [address] [place], [place] [address]  I-STAT 3, arterial blood gas (G3+)     Status: Abnormal   Collection Time: 08/01/18  4:57 AM  Result  Value Ref Range   pH, Arterial 7.510 (H) 7.350 - 7.450   pCO2 arterial 32.7 32.0 - 48.0 mmHg   pO2, Arterial 144.0 (H) 83.0 - 108.0 mmHg   Bicarbonate 26.1 20.0 - 28.0 mmol/L   TCO2 27 22 - 32 mmol/L   O2 Saturation 99.0 %   Acid-Base Excess 3.0 (H) 0.0 - 2.0 mmol/L   Patient temperature 98.1 F    Collection site RADIAL, ALLEN'S TEST ACCEPTABLE    Drawn by Operator    Sample type ARTERIAL   Glucose, capillary     Status: Abnormal   Collection Time: 08/01/18  8:22 AM  Result Value Ref Range   Glucose-Capillary 107 (H) 70 - 99 mg/dL   Comment 1 Capillary Specimen    Comment 2 Notify RN   Culture, respiratory (non-expectorated)     Status: None (Preliminary result)   Collection Time: 08/01/18 11:12 AM  Result Value Ref Range   Specimen Description TRACHEAL ASPIRATE    Special Requests NONE    Gram Stain      MODERATE WBC PRESENT, PREDOMINANTLY PMN RARE GRAM VARIABLE ROD NO SQUAMOUS EPITHELIAL CELLS PRESENT Performed at [facility], [address] [place], [place] [address]    Culture PENDING    Report Status PENDING   Glucose, capillary     Status: Abnormal   Collection Time: 08/01/18 11:19 AM  Result Value Ref Range   Glucose-Capillary 126 (H) 70 - 99 mg/dL   Comment 1 Capillary Specimen    Comment 2 Notify RN   Glucose, capillary     Status: Abnormal   Collection Time: 08/01/18  3:50 PM  Result Value Ref Range   Glucose-Capillary 115 (H) 70 - 99 mg/dL   Comment 1 Notify RN   Glucose, capillary     Status: Abnormal   Collection Time:  08/01/18  7:24 PM  Result Value Ref Range   Glucose-Capillary 166 (H) 70 - 99 mg/dL   Comment 1 Capillary Specimen    Comment 2 Notify RN   BMET today at 2000     Status: Abnormal   Collection Time: 08/01/18  7:44 PM  Result Value Ref Range   Sodium 139 135 - 145 mmol/L   Potassium 3.2 (L) 3.5 - 5.1 mmol/L   Chloride 104 98 - 111 mmol/L   CO2 26 22 - 32 mmol/L   Glucose, Bld 172 (H) 70 - 99 mg/dL   BUN 12 8 - 23 mg/dL    Creatinine, Ser 0.78 0.61 - 1.24 mg/dL   Calcium 8.6 (L) 8.9 - 10.3 mg/dL   GFR calc non Af Amer >60 >60 mL/min   GFR calc Af Amer >60 >60 mL/min    Comment: (NOTE) The eGFR has been calculated using the CKD EPI equation. This calculation has not been validated in all clinical situations. eGFR's persistently <60 mL/min signify possible Chronic Kidney Disease.    Anion gap 9 5 - 15    Comment: Performed at [facility] [address], [place], [place] [address]  CK     Status: None   Collection Time: 08/01/18 10:31 PM  Result Value Ref Range   Total CK 72 49 - 397 U/L    Comment: Performed at [facility], [place] [address] [place], [place] [address]  Vitamin B12     Status: None   Collection Time: 08/01/18 10:31 PM  Result Value Ref Range   Vitamin B-12 693 180 - 914 pg/mL    Comment: (NOTE) This assay is not validated for testing neonatal or myeloproliferative syndrome specimens for Vitamin B12 levels. Performed at [facility], [place] [address], [place], [place] [address]   Glucose, capillary     Status: Abnormal   Collection Time: 08/01/18 11:40 PM  Result Value Ref Range   Glucose-Capillary 179 (H) 70 - 99 mg/dL   Comment 1 Capillary Specimen    Comment 2 Notify RN   BMET in AM     Status: Abnormal   Collection Time: 08/02/18  5:00 AM  Result Value Ref Range   Sodium 140 135 - 145 mmol/L   Potassium 3.2 (L) 3.5 - 5.1 mmol/L   Chloride 104 98 - 111 mmol/L   CO2 28 22 - 32 mmol/L   Glucose, Bld 163 (H) 70 - 99 mg/dL   BUN 10 8 - 23 mg/dL   Creatinine, Ser 0.69 0.61 - 1.24 mg/dL   Calcium 8.6 (L) 8.9 - 10.3 mg/dL   GFR calc non Af Amer >60 >60 mL/min   GFR calc Af Amer >60 >60 mL/min    Comment: (NOTE) The eGFR has been calculated using the CKD EPI equation. This calculation has not been validated in all clinical situations. eGFR's persistently <60 mL/min signify possible Chronic Kidney Disease.    Anion gap 8 5 - 15    Comment: Performed at [facility] [address], [place],  [address]  CBC     Status: Abnormal   Collection Time: 08/02/18  5:00 AM  Result Value Ref Range   WBC 4.6 4.0 - 10.5 K/uL   RBC 4.03 (L) 4.22 - 5.81 MIL/uL   Hemoglobin 11.8 (L) 13.0 - 17.0 g/dL   HCT 35.4 (L) 39.0 - 52.0 %   MCV 87.8 80.0 - 100.0 fL   MCH 29.3  26.0 - 34.0 pg   MCHC 33.3 30.0 - 36.0 g/dL   RDW 12.4 11.5 - 15.5 %   Platelets 208 150 - 400 K/uL   nRBC 0.0 0.0 - 0.2 %    Comment: Performed at [facility], [place] [address] [place], [place] [address]  Magnesium     Status: None   Collection Time: 08/02/18  5:00 AM  Result Value Ref Range   Magnesium 1.7 1.7 - 2.4 mg/dL    Comment: Performed at [facility] [address] [place], [place] [address]  Phosphorus     Status: Abnormal   Collection Time: 08/02/18  5:00 AM  Result Value Ref Range   Phosphorus 1.6 (L) 2.5 - 4.6 mg/dL    Comment: Performed at [facility] [address] [place], [place] [address]  Glucose, capillary     Status: Abnormal   Collection Time: 08/02/18  5:58 AM  Result Value Ref Range   Glucose-Capillary 159 (H) 70 - 99 mg/dL   Comment 1 Capillary Specimen     Recent Results (from the past 240 hour(s))  MRSA PCR Screening     Status: None   Collection Time: 07/25/18  5:59 PM  Result Value Ref Range Status   MRSA by PCR NEGATIVE NEGATIVE Final    Comment:        The GeneXpert MRSA Assay (FDA approved for NASAL specimens only), is one component of a comprehensive MRSA colonization surveillance program. It is not intended to diagnose MRSA infection nor to guide or monitor treatment for MRSA infections. Performed at [facility], [place] [address], [place], [place] [address]   Culture, blood (routine x 2)     Status: None   Collection Time: 07/26/18  1:31 AM  Result Value Ref Range Status   Specimen Description BLOOD LEFT HAND  Final   Special Requests   Final    BOTTLES DRAWN AEROBIC ONLY Blood Culture results  may not be optimal due to an inadequate volume of blood received in culture bottles   Culture   Final    NO GROWTH 5 DAYS Performed at [facility], [place] [address], [place], [place] [address]    Report Status 07/31/2018 FINAL  Final  Culture, blood (routine x 2)     Status: None   Collection Time: 07/26/18  1:31 AM  Result Value Ref Range Status   Specimen Description BLOOD LEFT HAND  Final   Special Requests   Final    BOTTLES DRAWN AEROBIC ONLY Blood Culture results may not be optimal due to an inadequate volume of blood received in culture bottles   Culture   Final    NO GROWTH 5 DAYS Performed at [facility], [place] [address], [place], [place] [address]    Report Status 07/31/2018 FINAL  Final  Acid Fast Smear (AFB)     Status: None   Collection Time: 07/26/18 11:38 AM  Result Value Ref Range Status   AFB Specimen Processing Concentration  Final   Acid Fast Smear Negative  Final    Comment: (NOTE) Performed At: [facility] [place], [place] [id number] Rush Farmer MD [id number]    Source (AFB) TRACHEAL ASPIRATE  Final    Comment: Performed at [facility], [place] [address], [place],  [address]  Culture, Urine     Status: None   Collection Time: 07/26/18  7:05 PM  Result Value Ref  Range Status   Specimen Description URINE, RANDOM  Final   Special Requests NONE  Final   Culture   Final    NO GROWTH Performed at [facility], [address] [place], [place] [address]    Report Status 07/28/2018 FINAL  Final  Culture, respiratory (non-expectorated)     Status: None (Preliminary result)   Collection Time: 08/01/18 11:12 AM  Result Value Ref Range Status   Specimen Description TRACHEAL ASPIRATE  Final   Special Requests NONE  Final   Gram Stain   Final    MODERATE WBC PRESENT, PREDOMINANTLY PMN RARE GRAM VARIABLE ROD NO SQUAMOUS EPITHELIAL CELLS PRESENT Performed at [facility], [place] [address],  [place], [place] [address]    Culture PENDING  Incomplete   Report Status PENDING  Incomplete    Lipid Panel Recent Labs    07/31/18 0710  TRIG 164*    Studies/Results: Mr Cervical Spine Wo Contrast  Result Date: 08/02/2018 CLINICAL DATA:  62 y/o M; 3 months of weight loss with severe malnutrition and progressive weakness. EXAM: MRI CERVICAL SPINE WITHOUT CONTRAST MRI THORACIC SPINE WITHOUT CONTRAST TECHNIQUE: Multiplanar and multiecho pulse sequences of the cervical spine, to include the craniocervical junction and cervicothoracic junction, and thoracic spine, were obtained without intravenous contrast. COMPARISON:  None. FINDINGS: MRI CERVICAL SPINE FINDINGS Alignment: Straightening of cervical lordosis without listhesis. Vertebrae: No fracture, evidence of discitis, or bone lesion. Cord: Normal signal and morphology. Posterior Fossa, vertebral arteries, paraspinal tissues: Endotracheal and enteric tubes. Disc levels: C2-3: Moderate left-sided facet hypertrophy with mild left foraminal stenosis. No significant disc displacement, right foraminal stenosis, or canal stenosis. C3-4: Mild disc bulge.  No significant foraminal or canal stenosis. C4-5: Mild disc bulge. Small central protrusion with ventral thecal sac effacement, minimal contact on the anterior cord, and cord flattening. No significant foraminal or canal stenosis. C5-6: Mild disc bulge. Small central protrusion with ventral thecal sac effacement, minimal contact on the anterior cord, and cord flattening. Right-sided uncovertebral hypertrophy and facet hypertrophy results in mild right foraminal stenosis. No left foraminal stenosis. C6-7: Tiny central disc protrusion with ventral thecal sac effacement. No significant foraminal or canal stenosis. C7-T1: No significant disc displacement, foraminal stenosis, or canal stenosis. MRI THORACIC SPINE FINDINGS Alignment:  Physiologic. Vertebrae: No fracture, evidence of discitis, or bone lesion. Cord:   Normal signal and morphology. Paraspinal and other soft tissues: Left lung dependent consolidation and small effusion, partially visualized. Disc levels: No significant disc displacement, foraminal stenosis, or canal stenosis. IMPRESSION: Cervical spine: 1. Motion degradation of several sequences. 2. No acute osseous or cord signal abnormality identified. 3. Mild cervical spondylosis with multilevel disc and facet degenerative changes. 4. C4-5 and C5-6 small central disc protrusions contact the anterior cord with minimal cord flattening. No significant canal stenosis. 5. Mild left C2-3 and mild right C5-6 foraminal stenosis. No high-grade foraminal stenosis. Thoracic spine: 1. Motion degradation of several sequences. 2. No acute osseous or cord signal abnormality identified. 3. No significant disc displacement, foraminal stenosis, or canal stenosis. 4. Dependent consolidation of the left lung and small left effusion, partially visualized. Electronically Signed   By: Kristine Garbe M.D.   On: 08/02/2018 05:59   Mr Thoracic Spine Wo Contrast  Result Date: 08/02/2018 CLINICAL DATA:  62 y/o M; 3 months of weight loss with severe malnutrition and progressive weakness. EXAM: MRI CERVICAL SPINE WITHOUT CONTRAST MRI THORACIC SPINE WITHOUT CONTRAST TECHNIQUE: Multiplanar and multiecho pulse sequences of the cervical spine,  to include the craniocervical junction and cervicothoracic junction, and thoracic spine, were obtained without intravenous contrast. COMPARISON:  None. FINDINGS: MRI CERVICAL SPINE FINDINGS Alignment: Straightening of cervical lordosis without listhesis. Vertebrae: No fracture, evidence of discitis, or bone lesion. Cord: Normal signal and morphology. Posterior Fossa, vertebral arteries, paraspinal tissues: Endotracheal and enteric tubes. Disc levels: C2-3: Moderate left-sided facet hypertrophy with mild left foraminal stenosis. No significant disc displacement, right foraminal stenosis, or  canal stenosis. C3-4: Mild disc bulge.  No significant foraminal or canal stenosis. C4-5: Mild disc bulge. Small central protrusion with ventral thecal sac effacement, minimal contact on the anterior cord, and cord flattening. No significant foraminal or canal stenosis. C5-6: Mild disc bulge. Small central protrusion with ventral thecal sac effacement, minimal contact on the anterior cord, and cord flattening. Right-sided uncovertebral hypertrophy and facet hypertrophy results in mild right foraminal stenosis. No left foraminal stenosis. C6-7: Tiny central disc protrusion with ventral thecal sac effacement. No significant foraminal or canal stenosis. C7-T1: No significant disc displacement, foraminal stenosis, or canal stenosis. MRI THORACIC SPINE FINDINGS Alignment:  Physiologic. Vertebrae: No fracture, evidence of discitis, or bone lesion. Cord:  Normal signal and morphology. Paraspinal and other soft tissues: Left lung dependent consolidation and small effusion, partially visualized. Disc levels: No significant disc displacement, foraminal stenosis, or canal stenosis. IMPRESSION: Cervical spine: 1. Motion degradation of several sequences. 2. No acute osseous or cord signal abnormality identified. 3. Mild cervical spondylosis with multilevel disc and facet degenerative changes. 4. C4-5 and C5-6 small central disc protrusions contact the anterior cord with minimal cord flattening. No significant canal stenosis. 5. Mild left C2-3 and mild right C5-6 foraminal stenosis. No high-grade foraminal stenosis. Thoracic spine: 1. Motion degradation of several sequences. 2. No acute osseous or cord signal abnormality identified. 3. No significant disc displacement, foraminal stenosis, or canal stenosis. 4. Dependent consolidation of the left lung and small left effusion, partially visualized. Electronically Signed   By: Kristine Garbe M.D.   On: 08/02/2018 05:59   Dg Chest Port 1 View  Result Date:  08/01/2018 CLINICAL DATA:  Aspiration EXAM: PORTABLE CHEST 1 VIEW COMPARISON:  07/31/2018; 07/29/2018; 07/25/2018; chest CT-07/27/2016 FINDINGS: Grossly unchanged cardiac silhouette and mediastinal contours. Interval placement of a right upper extremity approach PICC line with tip projected over the distal SVC. Otherwise, stable position of support apparatus. No pneumothorax. Improved aeration the right lung base. Left basilar heterogeneous opacities are unchanged. No new focal airspace opacities. No evidence of edema. No pneumothorax. No acute osseus abnormalities. IMPRESSION: 1. Right upper extremity approach PICC line tip projects over the distal SVC. 2. Otherwise, stable position of support apparatus. No pneumothorax. 3. Improved aeration of the right lung base with persistent left basilar opacities, atelectasis versus infiltrate. Electronically Signed   By: Sandi Mariscal M.D.   On: 08/01/2018 07:20   Dg Chest Port 1 View  Result Date: 07/31/2018 CLINICAL DATA:  Xray for intubation and og tube placement. EXAM: PORTABLE CHEST 1 VIEW COMPARISON:  Chest x-ray from earlier same day. FINDINGS: Endotracheal tube is well positioned with tip approximately 4 cm above the carina. Enteric tube passes below the diaphragm. Heart size and mediastinal contours are within normal limits. Subtle bibasilar opacities again. Lungs otherwise clear. No pneumothorax seen. IMPRESSION: 1. Endotracheal tube is well positioned with tip approximately 4 cm above the carina. 2. Enteric tube passes below the diaphragm. 3. Subtle bibasilar opacities which could represent atelectasis, aspiration and/or small pleural effusions. Electronically Signed   By: Franki Cabot  M.D.   On: 07/31/2018 11:04   [place] Ekg Site Rite  Result Date: 07/31/2018 If Site Rite image not attached, placement could not be confirmed due to current cardiac rhythm.   Medications:  Scheduled: . amLODipine  10 mg Per Tube Daily  . brimonidine  2 drop Both Eyes TID   . chlorhexidine gluconate (MEDLINE KIT)  15 mL Mouth Rinse BID  . Chlorhexidine Gluconate Cloth  6 each Topical Daily  . dorzolamide-timolol  2 drop Both Eyes BID  . enoxaparin (LOVENOX) injection  40 mg Subcutaneous Q24H  . famotidine  20 mg Per Tube BID  . guaiFENesin  10 mL Oral Q4H  . insulin aspart  0-9 Units Subcutaneous Q4H  . ipratropium-albuterol  3 mL Nebulization QID  . mouth rinse  15 mL Mouth Rinse 10 times per day  . midazolam  2 mg Intravenous Once  . sodium chloride flush  10-40 mL Intracatheter Q12H   Continuous: . sodium chloride Stopped (07/30/18 0720)  . feeding supplement (VITAL AF 1.2 CAL) 1,000 mL (08/01/18 1213)  . lactated ringers 50 mL/hr at 08/02/18 0601  . levETIRAcetam Stopped (08/02/18 0037)  . piperacillin-tazobactam (ZOSYN)  IV 3.375 g (08/02/18 0601)  . propofol (DIPRIVAN) infusion Stopped (08/01/18 1009)   MRI Cervical spine: 1. Motion degradation of several sequences. 2. No acute osseous or cord signal abnormality identified. 3. Mild cervical spondylosis with multilevel disc and facet degenerative changes. 4. C4-5 and C5-6 small central disc protrusions contact the anterior cord with minimal cord flattening. No significant canal stenosis. 5. Mild left C2-3 and mild right C5-6 foraminal stenosis. No high-grade foraminal stenosis.  MRI Thoracic spine: 1. Motion degradation of several sequences. 2. No acute osseous or cord signal abnormality identified. 3. No significant disc displacement, foraminal stenosis, or canal stenosis. 4. Dependent consolidation of the left lung and small left effusion, partially visualized.  Assessment: 62 year old male with progressive weakness in conjunction with severe weight loss 1. Exam significantly improved since yesterday in terms of mentation and motor function. Reflexes now normalized in the setting of increased alertness.  2. MRI of cervical and thoracic spine shows no myelopathy.   3. Recent MRI brain  on 10/10 showed chronic ischemic microangiopathy without acute intracranial abnormality.  4. Vitamin B12 is 693. CK level was 72.  5. 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 Based on significant improvement on today's exam relative to only one day ago, however, the DDx now shifts more in favor of multifactorial etiology with hypoxic/hypercarbic encephalopathy in conjunction with chronic deconditioning and malnutrition as most likely contributing factors.   Recommendations: 1. Given significant improvement since yesterday and no cord lesions on MRI, can hold off on LP due to lower benefit/risk profile at this point in time.  2. Myasthenia gravis panel (ordered) 3. Thiamine deficiency could result in AMS and a polyneuropathy, and would be more likely in a patient with malnutrition/severe rapid weight loss. Thiamine level has been ordered.     LOS: 8 days   @Electronically  signed: Dr. Kerney Elbe 08/02/2018  7:35 AM

## 2018-08-02 NOTE — Progress Notes (Signed)
eLink Physician-Brief Progress Note Patient Name: Zack Crager DOB: 19-Nov-1955 MRN: [id number]   Date of Service  08/02/2018  HPI/Events of Note  Hyperglycemia - Blood glucose = 166 --> 179. Patient is NPO.   eICU Interventions  Will order: 1. Q 4 hour sensitive Novolog SSI.     Intervention Category Major Interventions: Hyperglycemia - active titration of insulin therapy  Sommer,Steven Eugene 08/02/2018, 12:06 AM

## 2018-08-02 NOTE — Progress Notes (Signed)
Pharmacy Antibiotic Note  Wayne Young is a 62 y.o. male admitted on 07/25/2018 with fever, AMS. PMH s/f RA on methotrexate, s/p treatment for latent TB. Pharmacy has been consulted for Zosyn dosing. Patient is on day 8 of antibiotic treatment with zosyn for concerns of aspiration PNA. Patient's remains afebrile and WBC remains WNL. Patient's Scr remains stable < 1, estimated CrCl ~84 mL/min.   Plan: Continue Zosyn 3.375gm IV q8h F/u clinical status, C&S, renal function, LOT  Height: 5\' 11"  (180.3 cm) Weight: 140 lb 10.5 oz (63.8 kg) IBW/kg (Calculated) : 75.3  Temp (24hrs), Avg:99 F (37.2 C), Min:97.9 F (36.6 C), Max:100.2 F (37.9 C)  Recent Labs  Lab 07/28/18 0341  07/30/18 0415 07/31/18 0710 08/01/18 0311 08/01/18 0426 08/01/18 1944 08/02/18 0500  WBC 7.0  --   --  7.2 2.4* 5.0  --  4.6  CREATININE 0.92   < > 0.64 0.73  --  1.08 0.78 0.69   < > = values in this interval not displayed.    Estimated Creatinine Clearance: 84.2 mL/min (by C-G formula based on SCr of 0.69 mg/dL).   No Known Allergies  Antibiotics this admission Vanc 10/8 >> 10/9 Zosyn 10/8 >>  Microbiology 10/14 Tracheal aspirate - few GNR 10/7 MRSA PCR - negative 10/8 BCx - NG x4 10/8 AFB - negtive 10/8 UCx - NG final  Thank you for allowing pharmacy to be a part of this patient's care.  12/8, PharmD PGY1 Pharmacy Resident Phone: [phone]  Please check AMION for all [facility] Pharmacy phone numbers 08/02/2018, 9:34 AM

## 2018-08-03 ENCOUNTER — Inpatient Hospital Stay (HOSPITAL_COMMUNITY): Payer: Medicaid Other

## 2018-08-03 ENCOUNTER — Encounter: Payer: Medicaid Other | Admitting: Pulmonary Disease

## 2018-08-03 ENCOUNTER — Ambulatory Visit: Payer: Medicaid Other | Admitting: Pulmonary Disease

## 2018-08-03 LAB — PHOSPHORUS: Phosphorus: 2.7 mg/dL (ref 2.5–4.6)

## 2018-08-03 LAB — CBC
HCT: 35.2 % — ABNORMAL LOW (ref 39.0–52.0)
HEMOGLOBIN: 11.8 g/dL — AB (ref 13.0–17.0)
MCH: 29.1 pg (ref 26.0–34.0)
MCHC: 33.5 g/dL (ref 30.0–36.0)
MCV: 86.9 fL (ref 80.0–100.0)
NRBC: 0 % (ref 0.0–0.2)
PLATELETS: 229 10*3/uL (ref 150–400)
RBC: 4.05 MIL/uL — AB (ref 4.22–5.81)
RDW: 12.3 % (ref 11.5–15.5)
WBC: 5.1 10*3/uL (ref 4.0–10.5)

## 2018-08-03 LAB — GLUCOSE, CAPILLARY
GLUCOSE-CAPILLARY: 151 mg/dL — AB (ref 70–99)
GLUCOSE-CAPILLARY: 171 mg/dL — AB (ref 70–99)
GLUCOSE-CAPILLARY: 174 mg/dL — AB (ref 70–99)
Glucose-Capillary: 142 mg/dL — ABNORMAL HIGH (ref 70–99)
Glucose-Capillary: 180 mg/dL — ABNORMAL HIGH (ref 70–99)
Glucose-Capillary: 198 mg/dL — ABNORMAL HIGH (ref 70–99)
Glucose-Capillary: 173 mg/dL — ABNORMAL HIGH (ref 70–99)

## 2018-08-03 LAB — CULTURE, RESPIRATORY W GRAM STAIN

## 2018-08-03 LAB — BASIC METABOLIC PANEL
ANION GAP: 9 (ref 5–15)
BUN: 7 mg/dL — AB (ref 8–23)
CO2: 28 mmol/L (ref 22–32)
Calcium: 8.6 mg/dL — ABNORMAL LOW (ref 8.9–10.3)
Chloride: 102 mmol/L (ref 98–111)
Creatinine, Ser: 0.71 mg/dL (ref 0.61–1.24)
Glucose, Bld: 185 mg/dL — ABNORMAL HIGH (ref 70–99)
POTASSIUM: 3 mmol/L — AB (ref 3.5–5.1)
SODIUM: 139 mmol/L (ref 135–145)

## 2018-08-03 LAB — CULTURE, RESPIRATORY

## 2018-08-03 LAB — MAGNESIUM: Magnesium: 1.8 mg/dL (ref 1.7–2.4)

## 2018-08-03 MED ORDER — THIAMINE HCL 100 MG/ML IJ SOLN
500.0000 mg | Freq: Three times a day (TID) | INTRAVENOUS | Status: AC
  Administered 2018-08-03 – 2018-08-06 (×9): 500 mg via INTRAVENOUS
  Filled 2018-08-03 (×9): qty 5

## 2018-08-03 MED ORDER — DOCUSATE SODIUM 50 MG/5ML PO LIQD
100.0000 mg | Freq: Two times a day (BID) | ORAL | Status: DC
  Administered 2018-08-03 – 2018-08-18 (×28): 100 mg
  Filled 2018-08-03 (×30): qty 10

## 2018-08-03 MED ORDER — BISACODYL 5 MG PO TBEC
10.0000 mg | DELAYED_RELEASE_TABLET | Freq: Once | ORAL | Status: DC
  Filled 2018-08-03: qty 2

## 2018-08-03 MED ORDER — THIAMINE HCL 100 MG/ML IJ SOLN
250.0000 mg | Freq: Three times a day (TID) | INTRAMUSCULAR | Status: AC
  Administered 2018-08-07 – 2018-08-09 (×9): 250 mg via INTRAVENOUS
  Filled 2018-08-03 (×9): qty 4

## 2018-08-03 MED ORDER — BISACODYL 10 MG RE SUPP
10.0000 mg | Freq: Once | RECTAL | Status: AC
  Administered 2018-08-03: 10 mg via RECTAL
  Filled 2018-08-03: qty 1

## 2018-08-03 MED ORDER — POTASSIUM CHLORIDE 10 MEQ/50ML IV SOLN
10.0000 meq | INTRAVENOUS | Status: AC
  Administered 2018-08-03 (×4): 10 meq via INTRAVENOUS
  Filled 2018-08-03 (×4): qty 50

## 2018-08-03 NOTE — Progress Notes (Signed)
eLink Physician-Brief Progress Note Patient Name: Wayne Young DOB: September 22, 1956 MRN: [id number]   Date of Service  08/03/2018  HPI/Events of Note  K+ = 3.0 and Creatinine = 0.70.  eICU Interventions  Will replace K+.      Intervention Category Major Interventions: Electrolyte abnormality - evaluation and management  [name] 08/03/2018, 6:29 AM

## 2018-08-03 NOTE — Progress Notes (Signed)
PULMONARY / CRITICAL CARE MEDICINE   NAME:  Wayne Young, MRN:  [id number], DOB:  31-Aug-1956, LOS: 9 ADMISSION DATE:  07/25/2018,   CHIEF COMPLAINT: Dyspnea and declining mental status.  BRIEF HISTORY:        62 year old man, history of RA and prior methotrexate, latent TB (treated), months of unexplained weight loss.  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  Admitted with dyspnea and then respiratory failure requiring mechanical ventilation.  Treating for left lower lobe pneumonia.  He failed extubation, experienced an episode suspicious for acute seizure and was reintubated.  Empirically on Keppra.  Brain imaging and EEG were both reassuring. Extubated 10/11 He was reintubated 10/13 for sudden respiratory distress and inability to clear secretions, concern for intermittent diplopia and weakness   SIGNIFICANT PAST MEDICAL HISTORY    Rheumatoid arthritis, positive PPD.   SIGNIFICANT EVENTS:   STUDIES:   CT chest angio 10/9 >> LLL consolidation  CT abd  / pelvis  10/7 >> ? SMA syndrome CT neck 10/7 >> neg MRI brain 10/10 >> chronic ischemic microangiopathy  EEG 10/10 neg  MRI T/ C spine >>  mild central disc protrusion C4-6 but no cord compression and no demyelinating lesions  CULTURES:  resp 10/14 >>> enterobacter >>  ANTIBIOTICS:  Zosyn 10/7 >>  LINES/TUBES:  ETT 10/7 >> 10/9, 10/9 >> 10/11 , 10/13 >> CONSULTANTS:   SUBJECTIVE:  Remains intubated, awake without sedation, critically ill. Afebrile.   CONSTITUTIONAL: BP (!) 155/107   Pulse 100   Temp 98.6 F (37 C) (Oral)   Resp 20   Ht 5\' 11"  (1.803 m)   Wt 64.2 kg   SpO2 96%   BMI 19.74 kg/m   I/O last 3 completed shifts: In: 3132.1 [I.V.:688.9; NG/GT:1166.4; IV Piggyback:1276.8] Out: 2495 [Urine:2210; Emesis/NG output:285]     Vent Mode: PRVC FiO2 (%):  [40 %] 40 % Set Rate:  [14 bmp] 14 bmp Vt Set:  [600 mL] 600 mL PEEP:  [5 cmH20] 5 cmH20 Pressure Support:  [5 cmH20-8 cmH20] 8  cmH20 Plateau Pressure:  [16 cmH20-18 cmH20] 18 cmH20  PHYSICAL EXAM: Elderly black male, appears older than stated age, awake and alert, orally intubated Calm, RA SS +1 , nonfocal power 4/5 all 4 extremities Decreased breath sounds on left, clear on right No rhonchi Soft and nontender abdomen, S1-S2 normal No edema    RESOLVED PROBLEM LIST   ASSESSMENT AND PLAN    Acute resp failure  -etiology unclear but  Looking  for neuromuscular disease , no evidence for vocal cord paralysis during intubation,  ? recurrent aspiration.    -Ventilator settings  reviewed and adjusted. -Tolerates pressure support 8-10/5 but not lower due to low tidal volumes -Check NIF -Await acetylcholinesterase antibodies, doubt neurological cause now given improved exam  Aspiration gram-negative pneumonia- ct  Zosyn, low procalcitonin is reassuring Await sensitivities for Enterobacter Swallow evaluation once extubated  Protein calorie malnutrition/weight loss -whole-body imaging negative, no clear etiology apparent, no evidence of active TB Start tube feeds  Severe hypokalemia-repleted   SUMMARY OF TODAY'S PLAN:   Would like to see him tolerate T-piece spontaneous breathing before extubation. Doubt neurological cause Post extubation he would likely need swallow evaluation and ENT exam. If he fails again, will need tracheostomy   Best Practice / Goals of Care / Disposition.    DVT PROPHYLAXIS: Lovenox SUP: Pepcid Updated wife at bedside    LABS  Glucose Recent Labs  Lab 08/02/18 1114 08/02/18 1550 08/02/18 1943  08/03/18 0016 08/03/18 0358 08/03/18 0730  GLUCAP 175* 182* 152* 151* 142* 180*    BMET Recent Labs  Lab 08/01/18 1944 08/02/18 0500 08/03/18 0447  NA 139 140 139  K 3.2* 3.2* 3.0*  CL 104 104 102  CO2 26 28 28   BUN 12 10 7*  CREATININE 0.78 0.69 0.71  GLUCOSE 172* 163* 185*    Liver Enzymes No results for input(s): AST, ALT, ALKPHOS, BILITOT, ALBUMIN in the  last 168 hours.  Electrolytes Recent Labs  Lab 07/29/18 2007  07/31/18 0855  08/01/18 1944 08/02/18 0500 08/03/18 0447  CALCIUM 8.7*   < >  --    < > 8.6* 8.6* 8.6*  MG 2.2  --  1.8  --   --  1.7 1.8  PHOS 4.0  --   --   --   --  1.6* 2.7   < > = values in this interval not displayed.    CBC Recent Labs  Lab 08/01/18 0426 08/02/18 0500 08/03/18 0447  WBC 5.0 4.6 5.1  HGB 11.1* 11.8* 11.8*  HCT 34.8* 35.4* 35.2*  PLT 192 208 229    ABG Recent Labs  Lab 07/31/18 0500 07/31/18 1142 08/01/18 0457  PHART 7.357 7.486* 7.510*  PCO2ART 71.5* 51.1* 32.7  PO2ART 73.4* 431.0* 144.0*    Coag's No results for input(s): APTT, INR in the last 168 hours.  Sepsis Markers Recent Labs  Lab 07/31/18 0856 08/01/18 0426 08/02/18 0500  PROCALCITON <0.10 0.22 0.13    Cardiac Enzymes No results for input(s): TROPONINI, PROBNP in the last 168 hours.   My critical care time x 61m   21m MD. FCCP. [facility] Pager [phone] If no response call [phone]   08/03/2018

## 2018-08-03 NOTE — Progress Notes (Signed)
Given presentation with severe weight loss and encephalopathy in conjunction with difficulty tracking earlier this admission, I have started a 6 day high-dose IV thiamine protocol: 500 mg IV TID x 3 days, then 250 mg IV TID x 3 days, then continue qd oral thiamine at 100 mg indefinitely. The thiamine which was drawn this morning will take time to result but may be useful as it was drawn prior to starting the high-dose protocol.   Electronically signed: Dr. Caryl Pina

## 2018-08-04 ENCOUNTER — Inpatient Hospital Stay (HOSPITAL_COMMUNITY): Payer: Medicaid Other

## 2018-08-04 DIAGNOSIS — L8990 Pressure ulcer of unspecified site, unspecified stage: Secondary | ICD-10-CM

## 2018-08-04 DIAGNOSIS — J9811 Atelectasis: Secondary | ICD-10-CM

## 2018-08-04 LAB — BASIC METABOLIC PANEL
ANION GAP: 4 — AB (ref 5–15)
BUN: 8 mg/dL (ref 8–23)
CALCIUM: 8.4 mg/dL — AB (ref 8.9–10.3)
CO2: 28 mmol/L (ref 22–32)
CREATININE: 0.64 mg/dL (ref 0.61–1.24)
Chloride: 105 mmol/L (ref 98–111)
GFR calc Af Amer: >60 mL/min (ref >60)
GLUCOSE: 210 mg/dL — AB (ref 70–99)
Potassium: 3.3 mmol/L — ABNORMAL LOW (ref 3.5–5.1)
Sodium: 137 mmol/L (ref 135–145)

## 2018-08-04 LAB — PHOSPHORUS: Phosphorus: 1.9 mg/dL — ABNORMAL LOW (ref 2.5–4.6)

## 2018-08-04 LAB — CBC
HEMATOCRIT: 35.1 % — AB (ref 39.0–52.0)
Hemoglobin: 11.3 g/dL — ABNORMAL LOW (ref 13.0–17.0)
MCH: 28.2 pg (ref 26.0–34.0)
MCHC: 32.2 g/dL (ref 30.0–36.0)
MCV: 87.5 fL (ref 80.0–100.0)
Platelets: 251 10*3/uL (ref 150–400)
RBC: 4.01 MIL/uL — ABNORMAL LOW (ref 4.22–5.81)
RDW: 12.3 % (ref 11.5–15.5)
WBC: 7.3 10*3/uL (ref 4.0–10.5)
nRBC: 0 % (ref 0.0–0.2)

## 2018-08-04 LAB — GLUCOSE, CAPILLARY
GLUCOSE-CAPILLARY: 150 mg/dL — AB (ref 70–99)
GLUCOSE-CAPILLARY: 170 mg/dL — AB (ref 70–99)
GLUCOSE-CAPILLARY: 181 mg/dL — AB (ref 70–99)
GLUCOSE-CAPILLARY: 205 mg/dL — AB (ref 70–99)
Glucose-Capillary: 181 mg/dL — ABNORMAL HIGH (ref 70–99)
Glucose-Capillary: 150 mg/dL — ABNORMAL HIGH (ref 70–99)

## 2018-08-04 LAB — MAGNESIUM: Magnesium: 1.9 mg/dL (ref 1.7–2.4)

## 2018-08-04 LAB — VITAMIN B1: VITAMIN B1 (THIAMINE): 82.4 nmol/L (ref 66.5–200.0)

## 2018-08-04 LAB — COPPER, SERUM: COPPER: 115 ug/dL (ref 72–166)

## 2018-08-04 MED ORDER — POTASSIUM PHOSPHATES 15 MMOLE/5ML IV SOLN
30.0000 mmol | Freq: Once | INTRAVENOUS | Status: AC
  Administered 2018-08-04: 30 mmol via INTRAVENOUS
  Filled 2018-08-04: qty 10

## 2018-08-04 MED ORDER — FENTANYL CITRATE (PF) 100 MCG/2ML IJ SOLN
INTRAMUSCULAR | Status: AC
  Administered 2018-08-04: 100 ug
  Filled 2018-08-04: qty 2

## 2018-08-04 MED ORDER — SODIUM CHLORIDE 0.9 % IV SOLN
2.0000 g | INTRAVENOUS | Status: AC
  Administered 2018-08-04 – 2018-08-08 (×5): 2 g via INTRAVENOUS
  Filled 2018-08-04 (×5): qty 20

## 2018-08-04 MED ORDER — MIDAZOLAM HCL 2 MG/2ML IJ SOLN
INTRAMUSCULAR | Status: AC
  Administered 2018-08-04: 11:00:00
  Filled 2018-08-04: qty 2

## 2018-08-04 NOTE — Progress Notes (Signed)
RT NOTE: RT started patients chest PT. Within 2 minutes patients HR increased by 20 and patients sat's dropped. RT will attempt chest PT next round. RT will continue to monitor.

## 2018-08-04 NOTE — Progress Notes (Signed)
62 year old with a history of rheumatoid arthritis and unexplained weight loss admitted 10/7 with dyspnea and left lower lobe pneumonia requiring mechanical ventilation.  He was extubated on 10/9 but soon reintubated due to suspected seizure episode and altered mental status.  He was extubated 10/11 but then reintubated 10/13 for sudden respiratory distress.  This time there was some concern for diplopia and weakness and a neuromuscular cause was felt possible.    Neurology was consulted and MRI was obtained which showed mild central disc protrusion C4-6 but no cord compression and no demyelinating lesions.  Acetylcholine receptor antibodies are pending He was treated with Zosyn for left lower lobe Enterobacter pneumonia.  On exam-appears weak and deconditioned, awake and alert in bed, overnight had some anxiety and required Ativan intermittent, decreased breath sounds on the left, clear on right, S1-S2 normal, power 4/5 all 4 extremities  Chest x-ray personally reviewed shows left lower lobe airspace disease with volume loss consistent with atelectasis.  Labs show no leukocytosis, mild hypokalemia and hypophosphatemia.  Impression/plan Acute respiratory failure-continue spontaneous breathing trials While he does have aspiration pneumonia, neuromuscular causes being also investigated.  Acetylcholine receptor antibodies are pending.  Neurology trying high-dose thiamine.  Atelectasis left lung-bedside bronchoscopy performed with removal of secretions and inspissated mucus.  Enterobacter pneumonia-changed to ceftriaxone for another 3 days and use procalcitonin to guide duration  Protein calorie malnutrition-some evidence of refeeding causing hypophosphatemia, replete hypokalemia. Severe deconditioning-attempt bedside mobilization /PT even though he is underwent  My critical care time independent of procedures x 39m  Cyril Mourning MD. FCCP. [facility] Pager [phone] If no  response call [phone]   08/04/2018

## 2018-08-04 NOTE — Progress Notes (Signed)
PULMONARY / CRITICAL CARE MEDICINE   NAME:  Wayne Young, MRN:  [id number], DOB:  02-Jan-1956, LOS: 10 ADMISSION DATE:  07/25/2018,   CHIEF COMPLAINT: Dyspnea and declining mental status.  BRIEF HISTORY:        62 year old man, history of RA and prior methotrexate, latent TB (treated), months of unexplained weight loss.  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  Admitted with dyspnea and then respiratory failure requiring mechanical ventilation.  Treating for left lower lobe pneumonia.  He failed extubation, experienced an episode suspicious for acute seizure and was reintubated.  Empirically on Keppra.  Brain imaging and EEG were both reassuring. Extubated 10/11 He was reintubated 10/13 for sudden respiratory distress and inability to clear secretions, concern for intermittent diplopia and weakness   SIGNIFICANT PAST MEDICAL HISTORY    Rheumatoid arthritis, positive PPD.   SIGNIFICANT EVENTS:  10/17 suction bronch, normal anatomy with thick clear mucous on left partially cleared with saline lavage   STUDIES:   CT chest angio 10/9 >> LLL consolidation  CT abd  / pelvis  10/7 >> ? SMA syndrome CT neck 10/7 >> neg MRI brain 10/10 >> chronic ischemic microangiopathy  EEG 10/10 neg  MRI T/ C spine >>  mild central disc protrusion C4-6 but no cord compression and no demyelinating lesions  10/13 myasthenia gravis labs (2-4day turnaround)>  CULTURES:  resp 10/14 >>> enterobacter >> CTX sens  ANTIBIOTICS:  Zosyn 10/7 >> 10/17 CTX >>  LINES/TUBES:  ETT 10/7 >> 10/9, 10/9 >> 10/11 , 10/13 >> CONSULTANTS:   SUBJECTIVE:  Remains intubated, awake without sedation, critically ill. Wants to ambulate with PT Afebrile.   CONSTITUTIONAL: BP (!) 141/86   Pulse 87   Temp 99 F (37.2 C) (Oral)   Resp 14   Ht 5\' 11"  (1.803 m)   Wt 64.5 kg   SpO2 98%   BMI 19.83 kg/m   I/O last 3 completed shifts: In: 3189.3 [I.V.:207; NG/GT:2103.4; IV Piggyback:878.8] Out: 1850  [Urine:1850]     Vent Mode: CPAP;PSV FiO2 (%):  [40 %-100 %] 100 % Set Rate:  [14 bmp] 14 bmp Vt Set:  [600 mL] 600 mL PEEP:  [5 cmH20] 5 cmH20 Pressure Support:  [12 cmH20] 12 cmH20 Plateau Pressure:  [17 cmH20-19 cmH20] 17 cmH20  PHYSICAL EXAM: Elderly black male, appears older than stated age, intubated/awake/alert Calm, RA SS 0 , nonfocal power 4/5 all 4 extremities Decreased sounds on left lung, good air movement on right No rhonichi/wheeze Soft belly Heart sounds normal rate No signficant edema  RESOLVED PROBLEM LIST   ASSESSMENT AND PLAN    Acute resp failure  -etiology unclear but  Looking  for neuromuscular disease , no evidence for vocal cord paralysis during intubation,  ? recurrent aspiration.  Thick mucous on left w/ tracheal deviation.  Now s/p suction bronch with thick clear mucous on left side  -Ventilator settings  reviewed and adjusted. -Tolerates pressure support 8-10/5 but not lower due to low tidal volumes -Check NIF -pending acetylcholinesterase antibodies, doubt neurological cause now given improved exam  Aspiration gram-negative pneumonia- d/c  Zosyn, low procalcitonin is reassuring Await sensitivities for Enterobacter Swallow evaluation once extubated  Protein calorie malnutrition/weight loss -whole-body imaging negative, no clear etiology apparent, no evidence of active TB Start tube feeds  Severe hypokalemia-repleted   SUMMARY OF TODAY'S PLAN:   Now s/p suction bronch, Would like to see him tolerate T-piece spontaneous breathing before extubation. Doubt neurological cause, MG labs pending Post extubation  he would likely need swallow evaluation and ENT exam. If he fails again, will need tracheostomy PT to work with patient on vent (order changed)   Best Practice / Goals of Care / Disposition.    DVT PROPHYLAXIS: Lovenox SUP: Pepcid Updated wife at bedside    LABS  Glucose Recent Labs  Lab 08/03/18 1129 08/03/18 1517  08/03/18 1942 08/03/18 2345 08/04/18 0411 08/04/18 0748  GLUCAP 198* 174* 173* 171* 181* 150*    BMET Recent Labs  Lab 08/02/18 0500 08/03/18 0447 08/04/18 0456  NA 140 139 137  K 3.2* 3.0* 3.3*  CL 104 102 105  CO2 28 28 28   BUN 10 7* 8  CREATININE 0.69 0.71 0.64  GLUCOSE 163* 185* 210*    Liver Enzymes No results for input(s): AST, ALT, ALKPHOS, BILITOT, ALBUMIN in the last 168 hours.  Electrolytes Recent Labs  Lab 08/02/18 0500 08/03/18 0447 08/04/18 0456  CALCIUM 8.6* 8.6* 8.4*  MG 1.7 1.8 1.9  PHOS 1.6* 2.7 1.9*    CBC Recent Labs  Lab 08/02/18 0500 08/03/18 0447 08/04/18 0456  WBC 4.6 5.1 7.3  HGB 11.8* 11.8* 11.3*  HCT 35.4* 35.2* 35.1*  PLT 208 229 251    ABG Recent Labs  Lab 07/31/18 0500 07/31/18 1142 08/01/18 0457  PHART 7.357 7.486* 7.510*  PCO2ART 71.5* 51.1* 32.7  PO2ART 73.4* 431.0* 144.0*    Coag's No results for input(s): APTT, INR in the last 168 hours.  Sepsis Markers Recent Labs  Lab 07/31/18 0856 08/01/18 0426 08/02/18 0500  PROCALCITON <0.10 0.22 0.13    Cardiac Enzymes No results for input(s): TROPONINI, PROBNP in the last 168 hours.   08/04/18, DO  PGY2 [place] 08/04/2018 11:01 AM    08/04/2018

## 2018-08-04 NOTE — Evaluation (Signed)
Physical Therapy Evaluation Patient Details Name: Wayne Young MRN: [id number] DOB: [birth year] Today's Date: 08/04/2018   History of Present Illness  62 y.o. male admitted with dyspnea and respiratory failure requiring mechanical ventilation. Extubated 10/11, re-intubated 10/13. Has experienced gradual weakness with falls since this past June per family, neurology work up for MG/neuromuscular etiology pending at time of PT evaluation. PMH includes but not limted to: COPD, RA, unexplained weight loss.     Clinical Impression  Pt admitted with above diagnosis. Pt currently with functional limitations due to the deficits listed below (see PT Problem List). PTA, pt independent with intermittent use of SPC around home. Lives with wife with 4 stairs to enter. Upon eval pt limited to bed level therex due to fatigue, wife requests we attempt further mobilization tomorrow. Focused on therex and will continue to assess and update recs as appropriate.  Pt will benefit from skilled PT to increase their independence and safety with mobility to allow discharge to the venue listed below.       Follow Up Recommendations SNF(Pending Progress. )    Equipment Recommendations  (TBD)    Recommendations for Other Services OT consult     Precautions / Restrictions Precautions Precautions: Fall Precaution Comments: mech vent      Mobility  Bed Mobility               General bed mobility comments: defered, lethargic, pts wife requsts we attempt moblity tomorrow.   Transfers                    Ambulation/Gait                Stairs            Wheelchair Mobility    Modified Rankin (Stroke Patients Only)       Balance                                             Pertinent Vitals/Pain      Home Living Family/patient expects to be discharged to:: Private residence Living Arrangements: Spouse/significant other Available Help at Discharge:  Family Type of Home: House Home Access: Stairs to enter Entrance Stairs-Rails: Can reach both Entrance Stairs-Number of Steps: 4 Home Layout: One level Home Equipment: Cane - single point      Prior Function Level of Independence: Independent with assistive device(s)               Hand Dominance        Extremity/Trunk Assessment   Upper Extremity Assessment Upper Extremity Assessment: Generalized weakness    Lower Extremity Assessment Lower Extremity Assessment: Generalized weakness       Communication   Communication: (vent)  Cognition Arousal/Alertness: Awake/alert                                            General Comments      Exercises General Exercises - Lower Extremity Ankle Circles/Pumps: 20 reps Quad Sets: 20 reps Heel Slides: 10 reps Hip ABduction/ADduction: 10 reps Straight Leg Raises: 10 reps   Assessment/Plan    PT Assessment Patient needs continued PT services  PT Problem List Decreased strength;Decreased mobility       PT Treatment Interventions DME instruction;Gait  training;Stair training;Functional mobility training;Therapeutic activities;Therapeutic exercise    PT Goals (Current goals can be found in the Care Plan section)  Acute Rehab PT Goals Patient Stated Goal: Non stated PT Goal Formulation: With patient Time For Goal Achievement: 08/18/18 Potential to Achieve Goals: Fair    Frequency Min 3X/week   Barriers to discharge        Co-evaluation               AM-PAC PT "6 Clicks" Daily Activity  Outcome Measure Difficulty turning over in bed (including adjusting bedclothes, sheets and blankets)?: Unable Difficulty moving from lying on back to sitting on the side of the bed? : Unable Difficulty sitting down on and standing up from a chair with arms (e.g., wheelchair, bedside commode, etc,.)?: Unable Help needed moving to and from a bed to chair (including a wheelchair)?: Total Help needed walking  in hospital room?: Total Help needed climbing 3-5 steps with a railing? : Total 6 Click Score: 6    End of Session Equipment Utilized During Treatment: Oxygen;Other (comment)(Vent) Activity Tolerance: Patient limited by lethargy;Patient limited by fatigue Patient left: in bed;with call bell/phone within reach Nurse Communication: Mobility status PT Visit Diagnosis: Unsteadiness on feet (R26.81);Muscle weakness (generalized) (M62.81);History of falling (Z91.81)    Time: 1445-1500 PT Time Calculation (min) (ACUTE ONLY): 15 min   Charges:   PT Evaluation $PT Eval Moderate Complexity: 1 Mod        Etta Grandchild, PT, DPT Acute Rehabilitation Services Pager: [phone] Office: [phone]    Etta Grandchild 08/04/2018, 5:27 PM

## 2018-08-04 NOTE — Progress Notes (Signed)
Serum copper level came back normal.   Myasthenia gravis panel was a send out (sent on 10/14), with results pending (2-4 day turnaround time per central lab at [facility]).   Electronically signed: Dr. Caryl Pina

## 2018-08-04 NOTE — Procedures (Signed)
Bronchoscopy Procedure Note Wayne Young [id number] [birth year]  Procedure: Bronchoscopy Indications: Remove secretions  Procedure Details Consent: Risks of procedure as well as the alternatives and risks of each were explained to the (patient/caregiver).  Consent for procedure obtained. Time Out: Verified patient identification, verified procedure, site/side was marked, verified correct patient position, special equipment/implants available, medications/allergies/relevent history reviewed, required imaging and test results available.  Performed  In preparation for procedure, patient was given 100% FiO2 and bronchoscope lubricated. Sedation: Benzodiazepines versed 2 , fent 100 mcg  Airway entered and the following bronchi were examined: Bronchi.   Procedures performed: Thick inspissated mucus removed from left airways esp LLL, Rt airways clear Bronchoscope removed.  , Patient placed back on 100% FiO2 at conclusion of procedure.    Evaluation Hemodynamic Status: BP stable throughout; O2 sats: stable throughout Patient's Current Condition: stable Specimens:  None Complications: No apparent complications Patient did tolerate procedure well.   Comer Locket Vassie Loll 08/04/2018

## 2018-08-04 NOTE — Procedures (Signed)
Bedside Bronchoscopy Procedure Note Wayne Young [id number] January 08, 1956  Procedure: Bronchoscopy Indications: Remove secretions  Procedure Details: ET Tube Size: ET Tube secured at lip (cm): Bite block in place: Yes In preparation for procedure, Patient hyper-oxygenated with 100 % FiO2 Airway entered and the following bronchi were examined: LLL.   Patient placed back on 100% FiO2 at conclusion of procedure.    Evaluation BP (!) 143/85   Pulse 89   Temp 99 F (37.2 C) (Oral)   Resp (!) 24   Ht 5\' 11"  (1.803 m)   Wt 64.5 kg   SpO2 97%   BMI 19.83 kg/m  Breath Sounds:Clear and Diminished O2 sats: stable throughout Patient's Current Condition: stable Specimens:  None Complications: No apparent complications Patient did tolerate procedure well.  Patient suctioned and lavaged 10x 200cc saline used. Vitals currently stable HR 87, sat 98% and BP 141/86. RT will continue to monitor.    08/04/2018, 10:54 AM

## 2018-08-04 NOTE — Consult Note (Signed)
WOC Nurse wound consult note Reason for Consult: Consult requested for buttocks/sacrum Wound type: Patchy areas of full thickness skin loss located across bilat buttocks and sacrum; affected area approx 6X6cm Pressure Injury POA: Appearance and location are consistent with moisture associated skin damage, NOT a pressure injury Wound bed: pink and moist Drainage (amount, consistency, odor) small amt tan drainage, no odor Periwound: intact skin surrounding Dressing procedure/placement/frequency: Pt is critically ill with multiple systemic factors which can impair healing.  They are on a low airloss bed for pressure reduction, and foam dressing is in place to absorb drainage and protect from further injury. Discussed plan of care with family members at the bedside. Please re-consult if further assistance is needed.  Thank-you,  Cammie Mcgee MSN, RN, CWOCN, [place], CNS [phone]

## 2018-08-04 NOTE — Care Management Note (Signed)
Case Management Note  Patient Details  Name: Wayne Young MRN: [id number] Date of Birth: 14-Dec-1955  Subjective/Objective:    Pt admitted with SOB, involuntary weight loss   - and declining mental status               Action/Plan:   PTA from home with wife. Pt intubated on arrival and extubated 10/11.  Pt will need PT and nutritional consult when appropriate    Expected Discharge Date:                  Expected Discharge Plan:     In-House Referral:  Clinical Social Work  Discharge planning Services  CM Consult  Post Acute Care Choice:    Choice offered to:     DME Arranged:    DME Agency:     HH Arranged:    HH Agency:     Status of Service:  In process, will continue to follow  If discussed at Long Length of Stay Meetings, dates discussed:    Additional Comments: 08/04/2018 Pt remains intubated. Cherylann Parr, RN 08/04/2018, 5:24 PM

## 2018-08-05 ENCOUNTER — Inpatient Hospital Stay (HOSPITAL_COMMUNITY): Payer: Medicaid Other

## 2018-08-05 LAB — CBC
HEMATOCRIT: 34.3 % — AB (ref 39.0–52.0)
HEMOGLOBIN: 11 g/dL — AB (ref 13.0–17.0)
MCH: 28.3 pg (ref 26.0–34.0)
MCHC: 32.1 g/dL (ref 30.0–36.0)
MCV: 88.2 fL (ref 80.0–100.0)
NRBC: 0 % (ref 0.0–0.2)
Platelets: 255 10*3/uL (ref 150–400)
RBC: 3.89 MIL/uL — ABNORMAL LOW (ref 4.22–5.81)
RDW: 12.6 % (ref 11.5–15.5)
WBC: 7.1 10*3/uL (ref 4.0–10.5)

## 2018-08-05 LAB — BASIC METABOLIC PANEL
ANION GAP: 8 (ref 5–15)
BUN: 7 mg/dL — ABNORMAL LOW (ref 8–23)
CALCIUM: 8.7 mg/dL — AB (ref 8.9–10.3)
CO2: 26 mmol/L (ref 22–32)
CREATININE: 0.59 mg/dL — AB (ref 0.61–1.24)
Chloride: 101 mmol/L (ref 98–111)
GFR calc non Af Amer: >60 mL/min (ref >60)
Glucose, Bld: 176 mg/dL — ABNORMAL HIGH (ref 70–99)
Potassium: 3.2 mmol/L — ABNORMAL LOW (ref 3.5–5.1)
SODIUM: 135 mmol/L (ref 135–145)

## 2018-08-05 LAB — GLUCOSE, CAPILLARY
GLUCOSE-CAPILLARY: 157 mg/dL — AB (ref 70–99)
GLUCOSE-CAPILLARY: 190 mg/dL — AB (ref 70–99)
Glucose-Capillary: 165 mg/dL — ABNORMAL HIGH (ref 70–99)
Glucose-Capillary: 200 mg/dL — ABNORMAL HIGH (ref 70–99)
Glucose-Capillary: 172 mg/dL — ABNORMAL HIGH (ref 70–99)
Glucose-Capillary: 167 mg/dL — ABNORMAL HIGH (ref 70–99)

## 2018-08-05 LAB — MAGNESIUM: MAGNESIUM: 1.9 mg/dL (ref 1.7–2.4)

## 2018-08-05 LAB — PHOSPHORUS: PHOSPHORUS: 2.2 mg/dL — AB (ref 2.5–4.6)

## 2018-08-05 MED ORDER — POTASSIUM PHOSPHATES 15 MMOLE/5ML IV SOLN
30.0000 mmol | Freq: Once | INTRAVENOUS | Status: AC
  Administered 2018-08-05: 30 mmol via INTRAVENOUS
  Filled 2018-08-05: qty 10

## 2018-08-05 MED ORDER — VITAL AF 1.2 CAL PO LIQD
1000.0000 mL | ORAL | Status: DC
  Administered 2018-08-07 – 2018-08-09 (×2): 1000 mL
  Administered 2018-08-10: 65 mL
  Administered 2018-08-12 – 2018-08-19 (×5): 1000 mL

## 2018-08-05 MED ORDER — SODIUM CHLORIDE 3 % IN NEBU
4.0000 mL | INHALATION_SOLUTION | Freq: Three times a day (TID) | RESPIRATORY_TRACT | Status: DC
  Administered 2018-08-05 (×2): 4 mL via RESPIRATORY_TRACT
  Filled 2018-08-05 (×3): qty 4

## 2018-08-05 MED ORDER — SODIUM CHLORIDE 3 % IN NEBU
4.0000 mL | INHALATION_SOLUTION | Freq: Three times a day (TID) | RESPIRATORY_TRACT | Status: DC
  Administered 2018-08-05 – 2018-08-12 (×21): 4 mL via RESPIRATORY_TRACT
  Filled 2018-08-05 (×23): qty 4

## 2018-08-05 NOTE — Progress Notes (Signed)
62 year old with a history of rheumatoid arthritis and unexplained weight loss admitted 10/7 with dyspnea and left lower lobe pneumonia requiring mechanical ventilation. He was extubated on 10/9 but soon reintubated due to suspected seizure episode and altered mental status. He was extubated 10/11 but then reintubated 10/13 for sudden respiratory distress. This time there was some concern for diplopia and weakness and a neuromuscular cause was felt possible.   Neurology was consulted and MRI was obtained which showed mild central disc protrusion C4-6 but no cord compression and no demyelinating lesions.   He underwent bronchoscopy with removal of thick inspissated secretions on 10/17. On exam-continues to be weak and deconditioned, awake and alert, tolerating pressure support 12/5, decreased breath sounds on the left, clear on the right, S1-S2 normal, no nystagmus.  Chest x-ray personally reviewed shows persistent left lower lobe airspace disease with volume loss.  Labs show mild hypokalemia and hypophosphatemia without leukocytosis or significant anemia.  Impression/plan  Acute respiratory failure-continue pressure support weaning trials, goal would be T-piece and another trial of extubation.  Await acetylcholine receptor antibodies but doubt myasthenia High-dose timeline per neurology.  Atelectasis of left lung-add hypertonic saline nebs, continue chest PT and tracheal and to tolerate.  Enterobacter pneumonia-change from Zosyn to ceftriaxone and will treat until 10/21.  Severe protein calorie malnutrition with refeeding syndrome causing hypophosphatemia and hypokalemia-replete, continue tube feeds. Severe deconditioning-continue bedside PT even while on vent.  My critical care time x 61m  Cyril Mourning MD. FCCP. [place] [facility] Pager [phone] If no response call [phone]   08/05/2018

## 2018-08-05 NOTE — Progress Notes (Signed)
Physical Therapy Treatment Patient Details Name: Wayne Young MRN: [id number] DOB: 09-26-1956 Today's Date: 08/05/2018    History of Present Illness 62 y.o. male admitted with dyspnea and respiratory failure requiring mechanical ventilation. Extubated 10/11, re-intubated 10/13. Has experienced gradual weakness with falls since this past June per family, neurology work up for MG/neuromuscular etiology pending at time of PT evaluation. PMH includes but not limted to: COPD, RA, unexplained weight loss.     PT Comments    Pt progressing to sitting EOB this visit. Upon sitting, extensive secretions requiring suctioning and RN to provide deeper suctioning. Pt noticeably uncomfortable, HR elevated to 130, cues for breathing. After 5 minutes of intermittent suctioning pt resting HR 100, requests to return supine. reinforced BLE therex for pt and family, will cont to follow. Pt able to maintain static balance at times EOB this visit.    Follow Up Recommendations  SNF(pending progress)     Equipment Recommendations  (TBD)    Recommendations for Other Services OT consult     Precautions / Restrictions Precautions Precautions: Fall Precaution Comments: mech vent Restrictions Weight Bearing Restrictions: No    Mobility  Bed Mobility Overal bed mobility: Needs Assistance Bed Mobility: Supine to Sit;Sit to Supine     Supine to sit: Min assist Sit to supine: Min assist   General bed mobility comments: +2 assist for line mgt, pt able to move legs EOB but requires some asisatnce raising/lowering trunk back into bed  Transfers                 General transfer comment: defered due to patients difficulty with secretions   Ambulation/Gait                 Stairs             Wheelchair Mobility    Modified Rankin (Stroke Patients Only)       Balance                                            Cognition Arousal/Alertness: Awake/alert                                             Exercises      General Comments        Pertinent Vitals/Pain Pain Assessment: No/denies pain    Home Living                      Prior Function            PT Goals (current goals can now be found in the care plan section) Acute Rehab PT Goals Patient Stated Goal: Non stated PT Goal Formulation: With patient Time For Goal Achievement: 08/18/18 Potential to Achieve Goals: Fair Progress towards PT goals: Progressing toward goals    Frequency    Min 3X/week      PT Plan Current plan remains appropriate    Co-evaluation              AM-PAC PT "6 Clicks" Daily Activity  Outcome Measure  Difficulty turning over in bed (including adjusting bedclothes, sheets and blankets)?: Unable Difficulty moving from lying on back to sitting on the side of the bed? : Unable Difficulty sitting down  on and standing up from a chair with arms (e.g., wheelchair, bedside commode, etc,.)?: Unable Help needed moving to and from a bed to chair (including a wheelchair)?: Total Help needed walking in hospital room?: Total Help needed climbing 3-5 steps with a railing? : Total 6 Click Score: 6    End of Session Equipment Utilized During Treatment: Oxygen;Other (comment)(vent) Activity Tolerance: Patient limited by fatigue Patient left: in bed;with call bell/phone within reach Nurse Communication: Mobility status PT Visit Diagnosis: Unsteadiness on feet (R26.81);Muscle weakness (generalized) (M62.81);History of falling (Z91.81)     Time: 4287-6811 PT Time Calculation (min) (ACUTE ONLY): 25 min  Charges:  $Therapeutic Activity: 8-22 mins                     Etta Grandchild, PT, DPT Acute Rehabilitation Services Pager: [phone] Office: [phone]     Etta Grandchild 08/05/2018, 5:35 PM

## 2018-08-05 NOTE — Progress Notes (Addendum)
Nutrition Follow-up  DOCUMENTATION CODES:   Severe malnutrition in context of acute illness/injury, Underweight  INTERVENTION:    To better meet re-estimated needs, increase Vital AF 1.2 to 65 ml/h (1560 ml/day) to provide 1872 kcal, 117 gm protein, 1265 ml free water daily  Continue to monitor magnesium, potassium, and phosphorus, MD to replete as needed.  NUTRITION DIAGNOSIS:   Severe Malnutrition related to (acute vs chronic illness (work-up underway)) as evidenced by moderate fat depletion, severe muscle depletion, moderate muscle depletion, percent weight loss(27% weight loss within 3 months).  Ongoing  GOAL:   Patient will meet greater than or equal to 90% of their needs  Met with TF  MONITOR:   Vent status, TF tolerance, Labs, I & O's  ASSESSMENT:   62 yo male with PMH of DM, rheumatoid arthritis, HTN, positive PPD (completed treatment), and glaucoma who was admitted on 10/7 with SOB and involuntary weight loss. Intubated 10/7, extubated 10/9, but required re-intubation later that day.   Patient is being treated for enterobacter PNA. Currently receiving Vital AF 1.2 via OGT at 55 ml/h (1320 ml/day) to provide 1584 kcals, 99 gm protein, 1071 ml free water daily.   Remains intubated on ventilator support MV: 13 L/min Temp (24hrs), Avg:99.2 F (37.3 C), Min:98.2 F (36.8 C), Max:100.5 F (38.1 C)   Labs reviewed. Potassium 3.2 (L), phosphorus 2.2 (L), magnesium 1.9 WNL Low potassium and phosphorus related to refeeding syndrome, malnutrition. CBG's: 157-165-200 Medications reviewed and include thiamine, colace, novolog, potassium phosphate. I/O + 12.5 L since admission   Diet Order:   Diet Order            Diet NPO time specified  Diet effective now              EDUCATION NEEDS:   No education needs have been identified at this time  Skin:  Skin Assessment: Skin Integrity Issues: Skin Integrity Issues:: Stage II, Other (Comment) Stage II:  sacrum Other: MASD to buttocks, groin  Last BM:  10/16 type 7  Height:   Ht Readings from Last 1 Encounters:  07/31/18 5' 11"  (1.803 m)    Weight:   Wt Readings from Last 1 Encounters:  08/05/18 61.2 kg    Ideal Body Weight:  78.2 kg  BMI:  Body mass index is 18.82 kg/m.  Estimated Nutritional Needs:   Kcal:  1925  Protein:  90-100 gm  Fluid:  1.8 - 2 L    Molli Barrows, RD, LDN, [place] Pager [phone] After Hours Pager [phone]

## 2018-08-06 ENCOUNTER — Inpatient Hospital Stay (HOSPITAL_COMMUNITY): Payer: Medicaid Other

## 2018-08-06 LAB — GLUCOSE, CAPILLARY
Glucose-Capillary: 158 mg/dL — ABNORMAL HIGH (ref 70–99)
Glucose-Capillary: 199 mg/dL — ABNORMAL HIGH (ref 70–99)
Glucose-Capillary: 203 mg/dL — ABNORMAL HIGH (ref 70–99)
Glucose-Capillary: 156 mg/dL — ABNORMAL HIGH (ref 70–99)
Glucose-Capillary: 208 mg/dL — ABNORMAL HIGH (ref 70–99)

## 2018-08-06 LAB — BASIC METABOLIC PANEL
Anion gap: 9 (ref 5–15)
BUN: 11 mg/dL (ref 8–23)
CHLORIDE: 103 mmol/L (ref 98–111)
CO2: 27 mmol/L (ref 22–32)
CREATININE: 0.64 mg/dL (ref 0.61–1.24)
Calcium: 8.7 mg/dL — ABNORMAL LOW (ref 8.9–10.3)
GFR calc Af Amer: >60 mL/min (ref >60)
GFR calc non Af Amer: >60 mL/min (ref >60)
GLUCOSE: 217 mg/dL — AB (ref 70–99)
POTASSIUM: 3.4 mmol/L — AB (ref 3.5–5.1)
SODIUM: 139 mmol/L (ref 135–145)

## 2018-08-06 LAB — CBC
HEMATOCRIT: 35.2 % — AB (ref 39.0–52.0)
HEMOGLOBIN: 11.3 g/dL — AB (ref 13.0–17.0)
MCH: 28.5 pg (ref 26.0–34.0)
MCHC: 32.1 g/dL (ref 30.0–36.0)
MCV: 88.7 fL (ref 80.0–100.0)
NRBC: 0 % (ref 0.0–0.2)
Platelets: 286 10*3/uL (ref 150–400)
RBC: 3.97 MIL/uL — ABNORMAL LOW (ref 4.22–5.81)
RDW: 12.6 % (ref 11.5–15.5)
WBC: 7.9 10*3/uL (ref 4.0–10.5)

## 2018-08-06 LAB — VITAMIN B1: Vitamin B1 (Thiamine): 81.5 nmol/L (ref 66.5–200.0)

## 2018-08-06 LAB — PHOSPHORUS: Phosphorus: 2.5 mg/dL (ref 2.5–4.6)

## 2018-08-06 LAB — MAGNESIUM: MAGNESIUM: 2 mg/dL (ref 1.7–2.4)

## 2018-08-06 MED ORDER — POTASSIUM CHLORIDE 20 MEQ/15ML (10%) PO SOLN
40.0000 meq | Freq: Once | ORAL | Status: AC
  Administered 2018-08-06: 40 meq
  Filled 2018-08-06: qty 30

## 2018-08-06 NOTE — Progress Notes (Signed)
62 year old with a history of rheumatoid arthritis and unexplained weight loss admitted 10/7 with dyspnea and left lower lobe pneumonia requiring mechanical ventilation. He was extubated on 10/9 but soon reintubated due to suspected seizure episode and altered mental status. He was extubated 10/11 but then reintubated 10/13 for sudden respiratory distress. This time there was some concern for diplopia and weakness and a neuromuscular cause was felt possible.Neurology was consulted andMRI was obtained which showed mild central disc protrusion C4-6 but no cord compression and no demyelinating lesions.   He underwent bronchoscopy with removal of thick inspissated secretions on 10/17.  Chest x-ray personally reviewed, unfortunately shows left-sided consolidation is worse and volume loss.  Labs show mild hypokalemia otherwise no leukocytosis or significant anemia.  On exam-continues to be weak and deconditioned, secretions persist, does not tolerate lower pressure support board seems to have tidal volumes 300 range on 10/5, decreased breath sounds on left and clear on right, S1-S2 normal.  Impression/plan  Atelectasis of left lung-seems to have recurred in spite of bronchoscopy in 10/17, added hypertonic saline nebs, continue chest PT in position left side up for tracheobronchial toilet.  Acute respiratory failure-continue pressure support weaning trials but currently limited by left-sided atelectasis may need another bronchoscopy Received high-dose thiamine per neurology and await acetylcholine receptor antibodies.  Enterobacter pneumonia -being treated with ceftriaxone until 10/21.  Severe deconditioning-push bedside PT when one on the vent and sitting up position as much as possible.  Severe protein calorie malnutrition with refeeding syndrome-electrolytes have been aggressively repleted, continue tube feeds  Wife updated at bedside daily  My critical care time was 31  minutes.  Cyril Mourning MD. Tonny Bollman.  Pulmonary & Critical care Pager [phone] If no response call [phone]   08/06/2018

## 2018-08-07 ENCOUNTER — Inpatient Hospital Stay (HOSPITAL_COMMUNITY): Payer: Medicaid Other

## 2018-08-07 LAB — GLUCOSE, CAPILLARY
GLUCOSE-CAPILLARY: 190 mg/dL — AB (ref 70–99)
GLUCOSE-CAPILLARY: 244 mg/dL — AB (ref 70–99)
GLUCOSE-CAPILLARY: 216 mg/dL — AB (ref 70–99)
Glucose-Capillary: 141 mg/dL — ABNORMAL HIGH (ref 70–99)
Glucose-Capillary: 182 mg/dL — ABNORMAL HIGH (ref 70–99)
Glucose-Capillary: 243 mg/dL — ABNORMAL HIGH (ref 70–99)

## 2018-08-07 LAB — CBC
HCT: 44.1 % (ref 39.0–52.0)
Hemoglobin: 13.9 g/dL (ref 13.0–17.0)
MCH: 28.2 pg (ref 26.0–34.0)
MCHC: 31.5 g/dL (ref 30.0–36.0)
MCV: 89.5 fL (ref 80.0–100.0)
PLATELETS: 384 10*3/uL (ref 150–400)
RBC: 4.93 MIL/uL (ref 4.22–5.81)
RDW: 12.9 % (ref 11.5–15.5)
WBC: 11 10*3/uL — ABNORMAL HIGH (ref 4.0–10.5)
nRBC: 0 % (ref 0.0–0.2)

## 2018-08-07 LAB — BASIC METABOLIC PANEL
Anion gap: 8 (ref 5–15)
BUN: 15 mg/dL (ref 8–23)
CHLORIDE: 104 mmol/L (ref 98–111)
CO2: 27 mmol/L (ref 22–32)
Calcium: 8.8 mg/dL — ABNORMAL LOW (ref 8.9–10.3)
Creatinine, Ser: 0.57 mg/dL — ABNORMAL LOW (ref 0.61–1.24)
GFR calc Af Amer: >60 mL/min (ref >60)
GLUCOSE: 242 mg/dL — AB (ref 70–99)
POTASSIUM: 3.7 mmol/L (ref 3.5–5.1)
Sodium: 139 mmol/L (ref 135–145)

## 2018-08-07 LAB — PHOSPHORUS: Phosphorus: 2.5 mg/dL (ref 2.5–4.6)

## 2018-08-07 LAB — MAGNESIUM: Magnesium: 1.9 mg/dL (ref 1.7–2.4)

## 2018-08-07 MED ORDER — INSULIN DETEMIR 100 UNIT/ML ~~LOC~~ SOLN
10.0000 [IU] | Freq: Every day | SUBCUTANEOUS | Status: DC
  Administered 2018-08-07 – 2018-08-08 (×2): 10 [IU] via SUBCUTANEOUS
  Filled 2018-08-07 (×3): qty 0.1

## 2018-08-07 MED ORDER — FENTANYL CITRATE (PF) 100 MCG/2ML IJ SOLN
25.0000 ug | INTRAMUSCULAR | Status: DC | PRN
  Administered 2018-08-07 – 2018-08-09 (×5): 100 ug via INTRAVENOUS
  Filled 2018-08-07 (×6): qty 2

## 2018-08-07 NOTE — Progress Notes (Addendum)
Called the lab regarding the myasthenia gravis panel and had brief conference call with [facility] representative to obtain results by phone. Results for Achr binding antibody: 0.03, which is in the normal range. Results for Achr blocking antibody 22, which is also in the normal range. The result for the Achr modulating Ab is still pending.   MG has been relatively low on the DDx given the patient's overall clinical picture and more likely alternate etiology for his weakness, including acute respiratory failure on a background of recent rapid weight loss and deconditioning. Probability of MG now even lower given 2/3 resulted myasthenia panel Abs are negative. Neurology will sign off for now. Please call us if the patient's Achr modulating Ab comes back positive after results are available.   Electronically signed: Dr. Caryl Pina

## 2018-08-07 NOTE — Progress Notes (Signed)
CSW aware that pt may need snf placement at the time of discharge pending PT/OT evaluation of pt as pt progresses. CSW will continue to follow for further needs and assess pt once medically appropriate.     Claude Manges Nirali Magouirk, MSW, LCSW-A Emergency Department Clinical Social Worker [phone]

## 2018-08-07 NOTE — Progress Notes (Signed)
PULMONARY / CRITICAL CARE MEDICINE   NAME:  Wayne Young, MRN:  [id number], DOB:  08/06/1956, LOS: 13 ADMISSION DATE:  07/25/2018,   CHIEF COMPLAINT: Dyspnea and declining mental status.  BRIEF HISTORY:        62 year old man, history of RA and prior methotrexate, latent TB (treated), months of unexplained weight loss.  He quit smoking 21 years ago.  Admitted with dyspnea and then respiratory failure requiring mechanical ventilation.  Treating for left lower lobe pneumonia.  He failed extubation on 10/9, experienced an episode suspicious for acute seizure and was reintubated.  Empirically on Keppra.  Brain imaging and EEG were both reassuring. Extubated 10/11 He was reintubated 10/13 for sudden respiratory distress and inability to clear secretions, concern for intermittent diplopia and weakness   SIGNIFICANT PAST MEDICAL HISTORY    Rheumatoid arthritis, positive PPD.   SIGNIFICANT EVENTS:  10/17 suction bronch, normal anatomy with thick clear mucous on left partially cleared with saline lavage   STUDIES:   CT chest angio 10/9 >> LLL consolidation  CT abd  / pelvis  10/7 >> ? SMA syndrome CT neck 10/7 >> neg MRI brain 10/10 >> chronic ischemic microangiopathy  EEG 10/10 neg  MRI T/ C spine 10/13 >>  mild central disc protrusion C4-6 but no cord compression and no demyelinating lesions  10/13 myasthenia gravis labs (2-4day turnaround)> Achr binding antibody: 0.03- normal range.  Achr blocking antibody 22 - normal range Achr modulating Ab is still pending.  CULTURES:  resp 10/14 >>> enterobacter >> CTX sens  ANTIBIOTICS:  Zosyn 10/7 >> 10/17 CTX  10/17 >>>>  LINES/TUBES:  ETT 10/7 >> 10/9, 10/9 >> 10/11 ,          10/13 >> CONSULTANTS:   SUBJECTIVE:   Appears weak and deconditioned especially so after chest PT Denies pain Afebrile Moderate secretions per RT   CONSTITUTIONAL: BP 95/73 (BP Location: Left Arm)   Pulse 94   Temp 99.3 F (37.4 C) (Axillary)   Resp  18   Ht 5\' 11"  (1.803 m)   Wt 57 kg   SpO2 97%   BMI 17.53 kg/m   I/O last 3 completed shifts: In: 2902.2 [I.V.:170; NG/GT:2340; IV Piggyback:392.2] Out: 2715 [Urine:2715]     Vent Mode: PRVC FiO2 (%):  [40 %-50 %] 50 % Set Rate:  [14 bmp] 14 bmp Vt Set:  [600 mL] 600 mL PEEP:  [5 cmH20] 5 cmH20 Pressure Support:  [10 cmH20] 10 cmH20 Plateau Pressure:  [11 cmH20-16 cmH20] 15 cmH20  PHYSICAL EXAM: Elderly black man, appears weak and deconditioned, orally intubated Awake and alert off sedation, grossly nonfocal, power 4/5 all 4 extremities, no wasting S1-S2 normal Decreased breath sounds on left, clear on right, no rhonchi Soft and nontender abdomen  Today's chest x-ray personally reviewed shows improved aeration of left lung with left lower lobe consolidation  RESOLVED PROBLEM LIST   ASSESSMENT AND PLAN    Acute resp failure  -recurrent, etiology unclear -no evidence for neuromuscular disease , no evidence for vocal cord paralysis during intubation,  ? recurrent aspiration.    Left lower lobe atelectasis  Due to secretions, status post bronchoscopy   -Ventilator settings  reviewed and adjusted. -Tolerates pressure support 8-10/5 but not lower due to low tidal volumes -Follow acetylcholine receptor modulating antibody results but doubt myasthenia -We will need trial of extubation at some point and unfortunately tracheostomy if he fails again  Aspiration gram-negative pneumonia-ceftriaxone until 10/21 Swallow evaluation once extubated  Protein calorie  malnutrition/weight loss -whole-body imaging negative, no clear etiology apparent, no evidence of active TB - ct tube feeds -Refeeding syndrome-repleted hypophosphatemia and hypomagnesemia hypokalemia    SUMMARY OF TODAY'S PLAN:   Doubt neurological cause of recurrent respiratory failure, being treated for aspiration pneumonia , he is approaching trial of extubation now that the left lung is open again Post extubation  he would likely need swallow evaluation and ENT exam. If he fails again, will need tracheostomy PT to work with patient on vent    Best Practice / Goals of Care / Disposition.    DVT PROPHYLAXIS: Lovenox SUP: Pepcid Updated wife at bedside daily    LABS  Glucose Recent Labs  Lab 08/06/18 1130 08/06/18 1537 08/06/18 1925 08/07/18 0006 08/07/18 0356 08/07/18 0729  GLUCAP 203* 156* 208* 182* 190* 244*    BMET Recent Labs  Lab 08/05/18 0457 08/06/18 0538 08/07/18 0500  NA 135 139 139  K 3.2* 3.4* 3.7  CL 101 103 104  CO2 26 27 27   BUN 7* 11 15  CREATININE 0.59* 0.64 0.57*  GLUCOSE 176* 217* 242*    Liver Enzymes No results for input(s): AST, ALT, ALKPHOS, BILITOT, ALBUMIN in the last 168 hours.  Electrolytes Recent Labs  Lab 08/05/18 0457 08/06/18 0538 08/07/18 0500  CALCIUM 8.7* 8.7* 8.8*  MG 1.9 2.0 1.9  PHOS 2.2* 2.5 2.5    CBC Recent Labs  Lab 08/05/18 0457 08/06/18 0538 08/07/18 0500  WBC 7.1 7.9 11.0*  HGB 11.0* 11.3* 13.9  HCT 34.3* 35.2* 44.1  PLT 255 286 384    ABG Recent Labs  Lab 07/31/18 1142 08/01/18 0457  PHART 7.486* 7.510*  PCO2ART 51.1* 32.7  PO2ART 431.0* 144.0*    Coag's No results for input(s): APTT, INR in the last 168 hours.  Sepsis Markers Recent Labs  Lab 08/01/18 0426 08/02/18 0500  PROCALCITON 0.22 0.13    Cardiac Enzymes No results for input(s): TROPONINI, PROBNP in the last 168 hours.  My critical care time x 28m  21m MD. FCCP. [facility] Pager [phone] If no response call [phone]   08/07/2018 9:30 AM

## 2018-08-08 ENCOUNTER — Inpatient Hospital Stay (HOSPITAL_COMMUNITY): Payer: Medicaid Other

## 2018-08-08 LAB — PHOSPHORUS: Phosphorus: 2.5 mg/dL (ref 2.5–4.6)

## 2018-08-08 LAB — BASIC METABOLIC PANEL
Anion gap: 4 — ABNORMAL LOW (ref 5–15)
BUN: 22 mg/dL (ref 8–23)
CHLORIDE: 108 mmol/L (ref 98–111)
CO2: 27 mmol/L (ref 22–32)
CREATININE: 0.51 mg/dL — AB (ref 0.61–1.24)
Calcium: 8.2 mg/dL — ABNORMAL LOW (ref 8.9–10.3)
GFR calc Af Amer: >60 mL/min (ref >60)
GFR calc non Af Amer: >60 mL/min (ref >60)
GLUCOSE: 195 mg/dL — AB (ref 70–99)
Potassium: 3.3 mmol/L — ABNORMAL LOW (ref 3.5–5.1)
SODIUM: 139 mmol/L (ref 135–145)

## 2018-08-08 LAB — GLUCOSE, CAPILLARY
GLUCOSE-CAPILLARY: 174 mg/dL — AB (ref 70–99)
GLUCOSE-CAPILLARY: 172 mg/dL — AB (ref 70–99)
GLUCOSE-CAPILLARY: 164 mg/dL — AB (ref 70–99)
Glucose-Capillary: 118 mg/dL — ABNORMAL HIGH (ref 70–99)
Glucose-Capillary: 127 mg/dL — ABNORMAL HIGH (ref 70–99)
Glucose-Capillary: 157 mg/dL — ABNORMAL HIGH (ref 70–99)

## 2018-08-08 LAB — CBC
HCT: 35.1 % — ABNORMAL LOW (ref 39.0–52.0)
HEMOGLOBIN: 11.3 g/dL — AB (ref 13.0–17.0)
MCH: 28.9 pg (ref 26.0–34.0)
MCHC: 32.2 g/dL (ref 30.0–36.0)
MCV: 89.8 fL (ref 80.0–100.0)
Platelets: 331 10*3/uL (ref 150–400)
RBC: 3.91 MIL/uL — ABNORMAL LOW (ref 4.22–5.81)
RDW: 13.2 % (ref 11.5–15.5)
WBC: 5.6 10*3/uL (ref 4.0–10.5)
nRBC: 0 % (ref 0.0–0.2)

## 2018-08-08 LAB — MAGNESIUM: Magnesium: 2.1 mg/dL (ref 1.7–2.4)

## 2018-08-08 MED ORDER — POTASSIUM CHLORIDE 10 MEQ/100ML IV SOLN
10.0000 meq | INTRAVENOUS | Status: AC
  Administered 2018-08-08 (×2): 10 meq via INTRAVENOUS
  Filled 2018-08-08 (×2): qty 100

## 2018-08-08 MED ORDER — IPRATROPIUM-ALBUTEROL 0.5-2.5 (3) MG/3ML IN SOLN
3.0000 mL | Freq: Three times a day (TID) | RESPIRATORY_TRACT | Status: DC
  Administered 2018-08-08 – 2018-08-13 (×16): 3 mL via RESPIRATORY_TRACT
  Filled 2018-08-08 (×16): qty 3

## 2018-08-08 NOTE — Evaluation (Addendum)
Occupational Therapy Evaluation Patient Details Name: Wayne Young MRN: [id number] DOB: Aug 10, 1956 Today's Date: 08/08/2018    History of Present Illness 62 y.o. male admitted with dyspnea and respiratory failure requiring mechanical ventilation. Extubated 10/11, re-intubated 10/13. Has experienced gradual weakness with falls since this past June per family, neurology work up for MG/neuromuscular etiology pending at time of PT evaluation. PMH includes but not limted to: COPD, RA, unexplained weight loss.    Clinical Impression   PTA, pt was living with his wife and was independent. Pt currently requiring Min A for ADLs and Min A +2 for functional transfers. Pt presenting with decreased strength, balance, and activity tolerance. Pt highly motivated to return to PLOF. Pt performing side steps towards HOB with Min A +2 and VSS throughout. Pt would benefit from further acute OT to facilitate safe dc. Recommend dc to SNF for further OT to optimize safety, independence with ADLs, and return to PLOF.      Follow Up Recommendations  SNF;Supervision/Assistance - 24 hour    Equipment Recommendations  Other (comment)(Defer to next venue)    Recommendations for Other Services PT consult     Precautions / Restrictions Precautions Precautions: Fall Precaution Comments: Intubated Restrictions Weight Bearing Restrictions: No      Mobility Bed Mobility Overal bed mobility: Needs Assistance Bed Mobility: Supine to Sit;Sit to Supine     Supine to sit: Min assist;+2 for safety/equipment Sit to supine: Min assist;+2 for physical assistance   General bed mobility comments: Min A for assisting trunk. assistance to bring BLEs over EOB  Transfers Overall transfer level: Needs assistance Equipment used: 2 person hand held assist Transfers: Sit to/from Stand Sit to Stand: Min assist;+2 physical assistance;From elevated surface         General transfer comment: Min A +2 for power up into  standing and for gaining balance. Noted posterior lean    Balance Overall balance assessment: Needs assistance Sitting-balance support: No upper extremity supported;Feet supported Sitting balance-Leahy Scale: Fair     Standing balance support: Bilateral upper extremity supported;During functional activity Standing balance-Leahy Scale: Poor Standing balance comment: Reliant on UE and physical support                           ADL either performed or assessed with clinical judgement   ADL Overall ADL's : Needs assistance/impaired Eating/Feeding: NPO   Grooming: Min guard;Sitting Grooming Details (indicate cue type and reason): Min Guard A for safety in sitting at EOB Upper Body Bathing: Minimal assistance;Sitting   Lower Body Bathing: Minimal assistance;+2 for safety/equipment;Sit to/from stand   Upper Body Dressing : Minimal assistance   Lower Body Dressing: Bed level;Sitting/lateral leans;Minimal assistance Lower Body Dressing Details (indicate cue type and reason): Min A to don socks by bringing ankles up to hips. Performing at bed level             Functional mobility during ADLs: Minimal assistance;+2 for physical assistance(side steps towards [facility]) General ADL Comments: Pt limited by medical status and vent. Pt highly motivated to return to [name]     Vision         Perception     Praxis      Pertinent Vitals/Pain Pain Assessment: No/denies pain     Hand Dominance Right   Extremity/Trunk Assessment Upper Extremity Assessment Upper Extremity Assessment: Generalized weakness   Lower Extremity Assessment Lower Extremity Assessment: Generalized weakness   Cervical / Trunk Assessment Cervical / Trunk Assessment:  Normal   Communication Communication Communication: Other (comment)(Intubated)   Cognition Arousal/Alertness: Awake/alert Behavior During Therapy: WFL for tasks assessed/performed Overall Cognitive Status: Within Functional Limits for  tasks assessed                                 General Comments: WFL for tasks completed and while on vent.   General Comments  Wife present throughout session    Exercises     Shoulder Instructions      Home Living Family/patient expects to be discharged to:: Private residence Living Arrangements: Spouse/significant other Available Help at Discharge: Family Type of Home: House Home Access: Stairs to enter Secretary/administrator of Steps: 4 Entrance Stairs-Rails: Can reach both Home Layout: One level     Bathroom Shower/Tub: Chief Strategy Officer: Standard Bathroom Accessibility: Yes   Home Equipment: Cane - single point          Prior Functioning/Environment Level of Independence: Independent with assistive device(s)        Comments: SPC for functional mobility        OT Problem List: Decreased strength;Decreased range of motion;Decreased activity tolerance;Impaired balance (sitting and/or standing);Decreased knowledge of precautions;Cardiopulmonary status limiting activity;Decreased knowledge of use of DME or AE      OT Treatment/Interventions: Self-care/ADL training;Therapeutic exercise;Energy conservation;DME and/or AE instruction;Therapeutic activities;Patient/family education    OT Goals(Current goals can be found in the care plan section) Acute Rehab OT Goals Patient Stated Goal: "Return to normal" OT Goal Formulation: With patient/family Time For Goal Achievement: 08/22/18 Potential to Achieve Goals: Good  OT Frequency: Min 2X/week   Barriers to D/C:            Co-evaluation PT/OT/SLP Co-Evaluation/Treatment: Yes Reason for Co-Treatment: Complexity of the patient's impairments (multi-system involvement);For patient/therapist safety   OT goals addressed during session: ADL's and self-care      AM-PAC PT "6 Clicks" Daily Activity     Outcome Measure Help from another person eating meals?: Total Help from another  person taking care of personal grooming?: A Little Help from another person toileting, which includes using toliet, bedpan, or urinal?: A Lot Help from another person bathing (including washing, rinsing, drying)?: A Lot Help from another person to put on and taking off regular upper body clothing?: A Lot Help from another person to put on and taking off regular lower body clothing?: A Lot 6 Click Score: 12   End of Session Nurse Communication: Mobility status  Activity Tolerance: Patient tolerated treatment well Patient left: in bed;with call bell/phone within reach;with family/visitor present  OT Visit Diagnosis: Unsteadiness on feet (R26.81);Other abnormalities of gait and mobility (R26.89);Muscle weakness (generalized) (M62.81)                Time: 9983-3825   Charges:  OT General Charges $OT Visit: 1 Visit OT Evaluation $OT Eval High Complexity: 1 High OT Treatments $Self Care/Home Management : 8-22 mins  Tilley Faeth MSOT, OTR/L Acute Rehab Pager: [phone] Office: [phone]  Theodoro Grist Harshan Kearley 08/08/2018, 4:45 PM

## 2018-08-08 NOTE — Progress Notes (Signed)
   08/08/18 1500  Clinical Encounter Type  Visited With Patient and family together  Visit Type Initial  Spiritual Encounters  Spiritual Needs Emotional  In making rounds stepped in to visit patient with his wife present. Patient was alert but unable to speak due to Vent. Patient seem to understand my spiritual support by nodding. Upon leaving I waved good-bye and he also waved good-bye. I informed patient and wife that Spiritual care is available 24/7 just let nurse know. Chaplain Marilynn Latino [phone].

## 2018-08-08 NOTE — Progress Notes (Signed)
CSW reached out to number listed in chart for spouse. Individual answered the phone a CSW was leaving voicemail. CSW informed that spouse was at the hospital. CSW stopped by pt's room to speak with spouse regarding next steps in pt's care as well as to gather more information on pt, however spouse not at bedside at this time. CSW will check back to complete assessment once pt is more alert and stable or once spouse has arrived.    Claude Manges Dajour [place], MSW, LCSW-A Emergency Department Clinical Social Worker [phone]

## 2018-08-08 NOTE — Progress Notes (Signed)
Physical Therapy Treatment Patient Details Name: Wayne Young MRN: [id number] DOB: April 24, 1956 Today's Date: 08/08/2018    History of Present Illness 62 y.o. male admitted with dyspnea and respiratory failure requiring mechanical ventilation. Extubated 10/11, re-intubated 10/13. Has experienced gradual weakness with falls since this past June per family, neurology work up for MG/neuromuscular etiology pending at time of PT evaluation. PMH includes but not limted to: COPD, RA, unexplained weight loss.     PT Comments    Pt writing on paper to communicate today.  He is anxious re: mobility given his mucus during his last therapy session, but was agreeable to continue.  He was on PRVC, FiO2 50% and PEEP 5 with RR in the mid 20s throughout.  He was deep suctioned and received a breathing treatment just before we arrived today.  We went slow and assessed with each transition, starting with bed in chair mode, working a while in this position before coming to full upright sitting EOB.  Once we knew that was stable, pt wanted to stand and we were able to stand and side step x 2 to get in better position in the bed.  He is ready, if all things stay this way to progress OOB and for short distance gait even on the vent.  He did not have any audible or palpable secreations or coughing today.  PT will continue to follow acutely for safe mobility progression.     Follow Up Recommendations  CIR     Equipment Recommendations  None recommended by PT    Recommendations for Other Services   NA     Precautions / Restrictions Precautions Precautions: Fall Precaution Comments: Intubated Restrictions Weight Bearing Restrictions: No    Mobility  Bed Mobility Overal bed mobility: Needs Assistance Bed Mobility: Supine to Sit;Sit to Supine     Supine to sit: Min assist;+2 for safety/equipment Sit to supine: Min assist;+2 for physical assistance   General bed mobility comments: Min A for assisting  trunk. assistance to bring BLEs over EOB  Transfers Overall transfer level: Needs assistance Equipment used: 2 person hand held assist Transfers: Sit to/from Stand Sit to Stand: Min assist;+2 physical assistance;From elevated surface         General transfer comment: Min A +2 for power up into standing and for gaining balance. Noted posterior lean  Ambulation/Gait Ambulation/Gait assistance: Min assist;+2 physical assistance Gait Distance (Feet): 3 Feet Assistive device: 2 person hand held assist Gait Pattern/deviations: Step-to pattern     General Gait Details: side step x 2 up to [facility] for better positioning for return to supine.           Balance Overall balance assessment: Needs assistance Sitting-balance support: No upper extremity supported;Feet supported Sitting balance-Leahy Scale: Fair     Standing balance support: Bilateral upper extremity supported;During functional activity Standing balance-Leahy Scale: Poor Standing balance comment: Reliant on UE and physical support                            Cognition Arousal/Alertness: Awake/alert Behavior During Therapy: Anxious Overall Cognitive Status: Within Functional Limits for tasks assessed                                 General Comments: Pt is a bit anxious re: having mucus during mobility again.  Re-assured that we did everything we possibly could to prevent this from  occuring again.   RT deep suctioned him and he had a breathing tx just before we came in to see him.  We started slow and progressed to EOB slowly so that he could assess that he was ok.       Exercises General Exercises - Lower Extremity Long Arc Quad: AROM;Both;10 reps(5 sec holds) Hip Flexion/Marching: AROM;Both;10 reps Toe Raises: AROM;Both;10 reps Heel Raises: AROM;Both;10 reps    General Comments General comments (skin integrity, edema, etc.): Wife present.  VSS on full vent support FiO2 50% and PEEP 5, RR in  the 20s and O2 sats stable (some fluctuation, but not great wave during fluctuating readings).       Pertinent Vitals/Pain Pain Assessment: No/denies pain    Home Living Family/patient expects to be discharged to:: Private residence Living Arrangements: Spouse/significant other Available Help at Discharge: Family Type of Home: House Home Access: Stairs to enter Entrance Stairs-Rails: Can reach both Home Layout: One level Home Equipment: Cane - single point      Prior Function Level of Independence: Independent with assistive device(s)      Comments: SPC for functional mobility   PT Goals (current goals can now be found in the care plan section) Acute Rehab PT Goals Patient Stated Goal: "Return to normal" Progress towards PT goals: Progressing toward goals    Frequency    Min 3X/week      PT Plan Current plan remains appropriate    Co-evaluation PT/OT/SLP Co-Evaluation/Treatment: Yes Reason for Co-Treatment: Complexity of the patient's impairments (multi-system involvement);For patient/therapist safety;To address functional/ADL transfers PT goals addressed during session: Mobility/safety with mobility;Balance;Strengthening/ROM OT goals addressed during session: ADL's and self-care      AM-PAC PT "6 Clicks" Daily Activity  Outcome Measure  Difficulty turning over in bed (including adjusting bedclothes, sheets and blankets)?: Unable Difficulty moving from lying on back to sitting on the side of the bed? : Unable Difficulty sitting down on and standing up from a chair with arms (e.g., wheelchair, bedside commode, etc,.)?: Unable Help needed moving to and from a bed to chair (including a wheelchair)?: A Little Help needed walking in hospital room?: A Little Help needed climbing 3-5 steps with a railing? : A Lot 6 Click Score: 11    End of Session Equipment Utilized During Treatment: Other (comment);Oxygen(vent FiO2 50%, PEEP 5, PRVC mode) Activity Tolerance:  Patient tolerated treatment well Patient left: in bed;with call bell/phone within reach;with family/visitor present Nurse Communication: Mobility status PT Visit Diagnosis: Unsteadiness on feet (R26.81);Muscle weakness (generalized) (M62.81);History of falling (Z91.81)     Time: 6761-9509 PT Time Calculation (min) (ACUTE ONLY): 53 min  Charges:  $Therapeutic Exercise: 8-22 mins $Therapeutic Activity: 8-22 mins          Taraji Mungo B. Diante Barley, PT, DPT  Acute Rehabilitation [phone] pager #[phone] office            08/08/2018, 4:56 PM

## 2018-08-08 NOTE — Progress Notes (Signed)
PULMONARY / CRITICAL CARE MEDICINE   NAME:  Wayne Young, MRN:  [id number], DOB:  November 08, 1955, LOS: 14 ADMISSION DATE:  07/25/2018,   CHIEF COMPLAINT: Dyspnea and declining mental status.  BRIEF HISTORY:        62 year old man, history of RA and prior methotrexate, latent TB (treated), months of unexplained weight loss.  He quit smoking 21 years ago.  Admitted with dyspnea and then respiratory failure requiring mechanical ventilation.  Treating for left lower lobe pneumonia.  He failed extubation on 10/9, experienced an episode suspicious for acute seizure and was reintubated.  Empirically on Keppra.  Brain imaging and EEG were both reassuring. Extubated 10/11 He was reintubated 10/13 for sudden respiratory distress and inability to clear secretions, concern for intermittent diplopia and weakness  SIGNIFICANT PAST MEDICAL HISTORY    Rheumatoid arthritis, positive PPD.  SIGNIFICANT EVENTS:  10/17 suction bronch, normal anatomy with thick clear mucous on left partially cleared with saline lavage  STUDIES:   CT chest angio 10/9 >> LLL consolidation  CT abd  / pelvis  10/7 >> ? SMA syndrome CT neck 10/7 >> neg MRI brain 10/10 >> chronic ischemic microangiopathy  EEG 10/10 neg  MRI T/ C spine 10/13 >>  mild central disc protrusion C4-6 but no cord compression and no demyelinating lesions  10/13 myasthenia gravis labs (2-4day turnaround)> Achr binding antibody: 0.03- normal range.  Achr blocking antibody 22 - normal range Achr modulating Ab is still pending.  CULTURES:  resp 10/14 >>> enterobacter >> CTX sens  ANTIBIOTICS:  Zosyn 10/7 >> 10/17 CTX  10/17 >> 10/21  LINES/TUBES:  ETT 10/7 >> 10/9, 10/9 >> 10/11 ,          10/13 >> CONSULTANTS:   SUBJECTIVE:   Appears weak and deconditioned, discussed this with family bedside.  Patient states he can breath well if he is slow and intentional but "too much" activity gets him short of breath.  Was able to sit up bedside with PT but  got overwhelmed when asked to perform additional tasks Denies pain Afebrile Moderate secretions per RT   CONSTITUTIONAL: BP 100/71   Pulse 80   Temp 98.6 F (37 C) (Oral)   Resp 18   Ht 5\' 11"  (1.803 m)   Wt 58 kg   SpO2 92%   BMI 17.83 kg/m   I/O last 3 completed shifts: In: 2689.1 [NG/GT:2275; IV Piggyback:414.1] Out: 1390 [Urine:1390]     Vent Mode: PRVC FiO2 (%):  [50 %] 50 % Set Rate:  [14 bmp] 14 bmp Vt Set:  [600 mL] 600 mL PEEP:  [5 cmH20] 5 cmH20 Pressure Support:  [10 cmH20] 10 cmH20 Plateau Pressure:  [14 cmH20-19 cmH20] 19 cmH20  PHYSICAL EXAM:  Elderly black man, appears older than stated age, frail Awake/alert, pleasant and communicative with hand motions and writing messages S1/s2 normal Course lung sounds on left but significantly improved movement from when I saw him thursday Soft belly with no pain to palpatiobn  CXR reviewed 10/21, no significant change from prior  RESOLVED PROBLEM LIST   ASSESSMENT AND PLAN    Acute resp failure  -recurrent, etiology unclear -no evidence for neuromuscular disease (2/3 MG labs now wnl), no evidence for vocal cord paralysis during intubation,  ? recurrent aspiration.    Left lower lobe atelectasis  Due to secretions, status post bronchoscopy   -Ventilator settings  reviewed and adjusted. -Tolerates pressure support 8-10/5 but has failed attempts to wean multiple times -Follow acetylcholine receptor modulating antibody  results but doubt myasthenia (2/3 labs negative at this point and neuro has signed off) -We will need trial of extubation at some point and unfortunately tracheostomy if he fails again  Aspiration gram-negative pneumonia-ceftriaxone until 10/21 Swallow evaluation once extubated  Protein calorie malnutrition/weight loss -whole-body imaging negative, no clear etiology apparent, no evidence of active TB - ct tube feeds -Refeeding syndrome-repleted hypophosphatemia and hypomagnesemia  hypokalemia  SUMMARY OF TODAY'S PLAN:   Doubt neurological cause of recurrent respiratory failure, being treated for aspiration pneumonia , he will be considered for trach is repetitively failing vent wean If able to tolerate extubation he would likely need s/p swallow evaluation and ENT exam. PT to work with patient on vent, he tolerates sittnig bedside but little more at this poitn   [employer] / Goals of Care / Disposition.    DVT PROPHYLAXIS: Lovenox SUP: Pepcid Updated wife at bedside daily    LABS  Glucose Recent Labs  Lab 08/07/18 1126 08/07/18 1531 08/07/18 1915 08/08/18 0003 08/08/18 0346 08/08/18 0714  GLUCAP 243* 216* 141* 127* 174* 172*    BMET Recent Labs  Lab 08/06/18 0538 08/07/18 0500 08/08/18 0506  NA 139 139 139  K 3.4* 3.7 3.3*  CL 103 104 108  CO2 27 27 27   BUN 11 15 22   CREATININE 0.64 0.57* 0.51*  GLUCOSE 217* 242* 195*    Liver Enzymes No results for input(s): AST, ALT, ALKPHOS, BILITOT, ALBUMIN in the last 168 hours.  Electrolytes Recent Labs  Lab 08/06/18 0538 08/07/18 0500 08/08/18 0506  CALCIUM 8.7* 8.8* 8.2*  MG 2.0 1.9 2.1  PHOS 2.5 2.5 2.5    CBC Recent Labs  Lab 08/06/18 0538 08/07/18 0500 08/08/18 0506  WBC 7.9 11.0* 5.6  HGB 11.3* 13.9 11.3*  HCT 35.2* 44.1 35.1*  PLT 286 384 331    ABG No results for input(s): PHART, PCO2ART, PO2ART in the last 168 hours.  Coag's No results for input(s): APTT, INR in the last 168 hours.  Sepsis Markers Recent Labs  Lab 08/02/18 0500  PROCALCITON 0.13    Cardiac Enzymes No results for input(s): TROPONINI, PROBNP in the last 168 hours.  08/10/18, DO  PGY2 [facility] 08/08/2018 10:38 AM

## 2018-08-08 NOTE — Progress Notes (Signed)
Rehab Admissions Coordinator Note:  Patient was screened by Clois Dupes for appropriateness for an Inpatient Acute Rehab Consult per PT eval. OT rec SNF. Pt ventilated. I will follow his progress at this time.  Ottie Glazier, RN, MSN Rehab Admissions Coordinator [phone] 08/08/2018 5:27 PM

## 2018-08-08 NOTE — Progress Notes (Signed)
Attending:    Subjective: Remains mechanically ventilated Sitting up on side of bed with PT causes desaturation   Objective: Vitals:   08/08/18 1000 08/08/18 1022 08/08/18 1100 08/08/18 1129  BP:  128/81 130/80 130/80  Pulse: 85 96 92 84  Resp:    15  Temp:    99 F (37.2 C)  TempSrc:    Oral  SpO2: 96% (!) 80% 100% 95%  Weight:      Height:       Vent Mode: CPAP;PSV FiO2 (%):  [50 %] 50 % Set Rate:  [14 bmp] 14 bmp Vt Set:  [600 mL] 600 mL PEEP:  [5 cmH20] 5 cmH20 Pressure Support:  [10 cmH20] 10 cmH20 Plateau Pressure:  [15 cmH20-20 cmH20] 20 cmH20  Intake/Output Summary (Last 24 hours) at 08/08/2018 1203 Last data filed at 08/08/2018 1105 Gross per 24 hour  Intake 1879.14 ml  Output 375 ml  Net 1504.14 ml    .General:  In bed on vent HENT: NCAT ETT in place PULM: CTA B, vent supported breathing CV: RRR, no mgr GI: BS+, soft, nontender MSK: normal bulk and tone Neuro: sedated on vent      CBC    Component Value Date/Time   WBC 5.6 08/08/2018 0506   RBC 3.91 (L) 08/08/2018 0506   HGB 11.3 (L) 08/08/2018 0506   HCT 35.1 (L) 08/08/2018 0506   PLT 331 08/08/2018 0506   MCV 89.8 08/08/2018 0506   MCH 28.9 08/08/2018 0506   MCHC 32.2 08/08/2018 0506   RDW 13.2 08/08/2018 0506   LYMPHSABS 1.5 08/01/2018 0426   MONOABS 0.6 08/01/2018 0426   EOSABS 0.3 08/01/2018 0426   BASOSABS 0.0 08/01/2018 0426    BMET    Component Value Date/Time   NA 139 08/08/2018 0506   K 3.3 (L) 08/08/2018 0506   CL 108 08/08/2018 0506   CO2 27 08/08/2018 0506   GLUCOSE 195 (H) 08/08/2018 0506   BUN 22 08/08/2018 0506   CREATININE 0.51 (L) 08/08/2018 0506   CALCIUM 8.2 (L) 08/08/2018 0506   GFRNONAA >60 08/08/2018 0506   GFRAA >60 08/08/2018 0506    CXR images personally reviewed, subsegmental atelectasis LLL, improved, EGG in place  Impression/Plan: Acute respiratory failure with hypoxemia: continue pressure support during daytime, full vent support at  night Aspiraiton pneumonia: continue pulm toilette measures Generalized weakness: unclear etiology follow myasthenia labs, PT consult Enterobacter HCAP: finish ceftriaxone today  Wife updated bedside  My cc time 30 minutes  Heber , MD [place] [facility] Pager: [phone] Cell: [phone] After 3pm or if no response, call [phone]

## 2018-08-09 ENCOUNTER — Inpatient Hospital Stay (HOSPITAL_COMMUNITY): Payer: Medicaid Other

## 2018-08-09 LAB — COMPREHENSIVE METABOLIC PANEL
ALBUMIN: 2.3 g/dL — AB (ref 3.5–5.0)
ALT: 17 U/L (ref 0–44)
ANION GAP: 6 (ref 5–15)
AST: 13 U/L — ABNORMAL LOW (ref 15–41)
Alkaline Phosphatase: 34 U/L — ABNORMAL LOW (ref 38–126)
BUN: 19 mg/dL (ref 8–23)
CHLORIDE: 104 mmol/L (ref 98–111)
CO2: 27 mmol/L (ref 22–32)
Calcium: 8.6 mg/dL — ABNORMAL LOW (ref 8.9–10.3)
Creatinine, Ser: 0.65 mg/dL (ref 0.61–1.24)
GFR calc non Af Amer: >60 mL/min (ref >60)
Glucose, Bld: 218 mg/dL — ABNORMAL HIGH (ref 70–99)
POTASSIUM: 3.6 mmol/L (ref 3.5–5.1)
SODIUM: 137 mmol/L (ref 135–145)
Total Bilirubin: 0.5 mg/dL (ref 0.3–1.2)
Total Protein: 6 g/dL — ABNORMAL LOW (ref 6.5–8.1)

## 2018-08-09 LAB — GLUCOSE, CAPILLARY
GLUCOSE-CAPILLARY: 159 mg/dL — AB (ref 70–99)
GLUCOSE-CAPILLARY: 199 mg/dL — AB (ref 70–99)
Glucose-Capillary: 209 mg/dL — ABNORMAL HIGH (ref 70–99)
Glucose-Capillary: 234 mg/dL — ABNORMAL HIGH (ref 70–99)
Glucose-Capillary: 192 mg/dL — ABNORMAL HIGH (ref 70–99)
Glucose-Capillary: 137 mg/dL — ABNORMAL HIGH (ref 70–99)

## 2018-08-09 LAB — CBC
HCT: 35.3 % — ABNORMAL LOW (ref 39.0–52.0)
HEMOGLOBIN: 10.9 g/dL — AB (ref 13.0–17.0)
MCH: 28.2 pg (ref 26.0–34.0)
MCHC: 30.9 g/dL (ref 30.0–36.0)
MCV: 91.2 fL (ref 80.0–100.0)
NRBC: 0 % (ref 0.0–0.2)
PLATELETS: 337 10*3/uL (ref 150–400)
RBC: 3.87 MIL/uL — AB (ref 4.22–5.81)
RDW: 13.2 % (ref 11.5–15.5)
WBC: 7.2 10*3/uL (ref 4.0–10.5)

## 2018-08-09 MED ORDER — INSULIN DETEMIR 100 UNIT/ML ~~LOC~~ SOLN
15.0000 [IU] | Freq: Every day | SUBCUTANEOUS | Status: DC
  Administered 2018-08-09: 15 [IU] via SUBCUTANEOUS
  Filled 2018-08-09 (×2): qty 0.15

## 2018-08-09 MED ORDER — POTASSIUM CHLORIDE 20 MEQ/15ML (10%) PO SOLN
40.0000 meq | Freq: Once | ORAL | Status: AC
  Administered 2018-08-09: 40 meq via ORAL
  Filled 2018-08-09: qty 30

## 2018-08-09 MED ORDER — POTASSIUM PHOSPHATES 15 MMOLE/5ML IV SOLN
10.0000 mmol | Freq: Once | INTRAVENOUS | Status: AC
  Administered 2018-08-09: 10 mmol via INTRAVENOUS
  Filled 2018-08-09: qty 3.33

## 2018-08-09 MED ORDER — FUROSEMIDE 10 MG/ML IJ SOLN
20.0000 mg | Freq: Once | INTRAMUSCULAR | Status: AC
  Administered 2018-08-09: 20 mg via INTRAVENOUS
  Filled 2018-08-09: qty 2

## 2018-08-09 MED ORDER — SENNOSIDES 8.8 MG/5ML PO SYRP
10.0000 mL | ORAL_SOLUTION | Freq: Two times a day (BID) | ORAL | Status: DC | PRN
  Administered 2018-08-09: 10 mL via ORAL
  Filled 2018-08-09 (×2): qty 10

## 2018-08-09 NOTE — Progress Notes (Signed)
This note also relates to the following rows which could not be included: Pulse Rate - Cannot attach notes to unvalidated device data  RT NOTE: Patient oxygen was increased to 100% due to desating. RT titrated oxygen down to 70% per sats. Vitals are stable. RT will continue to monitor.

## 2018-08-09 NOTE — Progress Notes (Signed)
Nutrition Follow-up  DOCUMENTATION CODES:   Severe malnutrition in context of acute illness/injury, Underweight  INTERVENTION:   Continue:  Vital AF 1.2 at 65 ml/h (1560 ml per day)  Provides 1872 kcal, 117 gm protein, 1265 ml free water daily  NUTRITION DIAGNOSIS:   Severe Malnutrition related to (acute vs chronic illness (work-up underway)) as evidenced by moderate fat depletion, severe muscle depletion, moderate muscle depletion, percent weight loss(27% weight loss within 3 months).  Ongoing  GOAL:   Patient will meet greater than or equal to 90% of their needs  Met with TF  MONITOR:   Vent status, TF tolerance, Labs, I & O's  ASSESSMENT:   62 yo male with PMH of DM, rheumatoid arthritis, HTN, positive PPD (completed treatment), and glaucoma who was admitted on 10/7 with SOB and involuntary weight loss. Intubated 10/7, extubated 10/9, but required re-intubation later that day.   Patient remains intubated on ventilator support. He is alert and writing on paper to communicate. Failing SBT's, and may require tracheostomy. Diuresis continues for acute pulmonary edema and pleural effusions r/t HF. Myasthenia gravis labs were WNL. Now testing for NIF/FVC. MV: 11.3 L/min Temp (24hrs), Avg:99.4 F (37.4 C), Min:98.4 F (36.9 C), Max:100.2 F (37.9 C)   Labs reviewed. CBG's: [phone] Potassium 3.6 (WNL today), phosphorus 2.5 (WNL 10/21), magnesium 2.1 (WNL 10/21)  Medications reviewed and include Colace, Lasix, Novolog, Levemir, thiamine, potassium phosphate.  I/O +15.7 L since admission Weight continues to trend down.  Down 9% since admission.  Patient is tolerating TF without difficulty.  Diet Order:   Diet Order            Diet NPO time specified  Diet effective now              EDUCATION NEEDS:   No education needs have been identified at this time  Skin:  Skin Assessment: Skin Integrity Issues: Skin Integrity Issues:: Stage II, Other  (Comment) Stage II: sacrum Other: MASD to buttocks, groin  Last BM:  10/18 (type 5)  Height:   Ht Readings from Last 1 Encounters:  07/31/18 _0  (1.803 m)    Weight:   Wt Readings from Last 1 Encounters:  08/09/18 56.8 kg    Ideal Body Weight:  78.2 kg  BMI:  Body mass index is 17.46 kg/m.  Estimated Nutritional Needs:   Kcal:  1925  Protein:  90-100 gm  Fluid:  1.8 - 2 L    Molli Barrows, RD, LDN, [place] Pager [phone] After Hours Pager [phone]

## 2018-08-09 NOTE — Progress Notes (Addendum)
PULMONARY / CRITICAL CARE MEDICINE   NAME:  Wayne Young, MRN:  [id number], DOB:  October 22, 1955, LOS: 15 ADMISSION DATE:  07/25/2018,   CHIEF COMPLAINT: Dyspnea and declining mental status.  BRIEF HISTORY:        62 year old man, history of RA and prior methotrexate, latent TB (treated), months of unexplained weight loss.  He quit smoking 21 years ago.  Admitted with dyspnea and then respiratory failure requiring mechanical ventilation.  Treating for left lower lobe pneumonia.  He failed extubation on 10/9, experienced an episode suspicious for acute seizure and was reintubated.  Empirically on Keppra.  Brain imaging and EEG were both reassuring. Extubated 10/11 He was reintubated 10/13 for sudden respiratory distress and inability to clear secretions, concern for intermittent diplopia and weakness   SIGNIFICANT PAST MEDICAL HISTORY    Rheumatoid arthritis, positive PPD.   SIGNIFICANT EVENTS:  10/17 suction bronch, normal anatomy with thick clear mucous on left partially cleared with saline lavage   STUDIES:   CT chest angio 10/9 >> LLL consolidation CT abd  / pelvis  10/7 >> ? SMA syndrome CT neck 10/7 >> neg MRI brain 10/10 >> chronic ischemic microangiopathy  EEG 10/10 neg  MRI T/ C spine 10/13 >>  mild central disc protrusion C4-6 but no cord compression and no demyelinating lesions  10/13 myasthenia gravis labs (2-4day turnaround)> Achr binding antibody: 0.03- normal range.  Achr blocking antibody 22 - normal range Achr modulating Ab is still pending.  CULTURES:  resp 10/14 >>> enterobacter >> CTX sens  ANTIBIOTICS:  Zosyn 10/7 >> 10/17 CTX  10/17 >>>> 10/21  LINES/TUBES:  ETT 10/7 >> 10/9, 10/9 >> 10/11 ,          10/13 >> CONSULTANTS:   SUBJECTIVE:  Awake and alert, writing notes and appropriate. States he is working hard to breath Denies pain T Max 99.8 WBC is normal Moderate secretions per RT   CONSTITUTIONAL: BP 131/71   Pulse 95   Temp 99.3 F (37.4  C) (Oral)   Resp (!) 23   Ht 5\' 11"  (1.803 m)   Wt 56.8 kg   SpO2 96%   BMI 17.46 kg/m   I/O last 3 completed shifts: In: 2909.3 [I.V.:10; NG/GT:2285; IV Piggyback:614.3] Out: 995 [Urine:975; Emesis/NG output:20]     Vent Mode: PRVC FiO2 (%):  [40 %-50 %] 40 % Set Rate:  [14 bmp] 14 bmp Vt Set:  [600 mL] 600 mL PEEP:  [5 cmH20] 5 cmH20 Pressure Support:  [10 cmH20] 10 cmH20 Plateau Pressure:  [16 cmH20-20 cmH20] 20 cmH20  PHYSICAL EXAM: Elderly black male,thin,  appears weak and deconditioned, orally intubated, writing notes Awake and alert off sedation, grossly nonfocal, power 4/5 all 4 extremities, no wasting S1-S2 normal Bilateral chest excursion, coarse throughout, diminished per bases Soft and nontender abdomen, BS +, ND  Today's chest x-ray personally reviewed shows improved aeration of left lung with continued  left lower lobe consolidation/ infiltrate  RESOLVED PROBLEM LIST   ASSESSMENT AND PLAN    Acute resp failure  -recurrent, etiology unclear -no evidence for neuromuscular disease , no evidence for vocal cord paralysis during intubation,  ? recurrent aspiration.    Left lower lobe atelectasis  Due to secretions, status post bronchoscopy  - No reserve 2/2 deconditioning - Continue weaning trials as able -Tolerates pressure support 8-10/5 but not lower due to low tidal volumes - Per nursing unable to wean after any exertion - Follow acetylcholine receptor modulating antibody results but doubt myasthenia -  We will need trial of extubation at some point and unfortunately tracheostomy if he fails again - CXR prn - Sat goals of > 94% - Chest PT as able - PT/OT - Swallow eval once extubated - Most likely will need trach - 13 L positive, Lasix 20 x 1   Aspiration gram-negative pneumonia-ceftriaxone until 10/21 Zosyn completed CXR with continued infiltrate WBC normal T max 99.8 Plan Trend CBC/ fever curve Monitor off abx for now Consider PCT  Trend  CXR   GI Protein calorie malnutrition/weight loss -whole-body imaging negative, no clear etiology apparent, no evidence of active TB No BM since 10/18  Plan - ct tube feeds -Refeeding syndrome-repleted hypophosphatemia and hypomagnesemia hypokalemia - Trend mag/ phos - Add prn biscodyl  Renal + 13 L Adequate output Plan Replete electrolytes prn Trend BMET  Endocrine DM Plan CBG's Q 4 SSI Increase Detemir to 15 U    SUMMARY OF TODAY'S PLAN:   Continue to work on weaning with goal of extubation Awaiting acetylcholine testing results>> pending Replete phos  and K Increase Detimir to 15 Units  Best Practice / Goals of Care / Disposition.    DVT PROPHYLAXIS: Lovenox SUP: Pepcid Updated wife at bedside daily    LABS  Glucose Recent Labs  Lab 08/08/18 1127 08/08/18 1519 08/08/18 1930 08/08/18 2353 08/09/18 0429 08/09/18 0759  GLUCAP 164* 157* 118* 159* 209* 199*    BMET Recent Labs  Lab 08/07/18 0500 08/08/18 0506 08/09/18 0457  NA 139 139 137  K 3.7 3.3* 3.6  CL 104 108 104  CO2 27 27 27   BUN 15 22 19   CREATININE 0.57* 0.51* 0.65  GLUCOSE 242* 195* 218*    Liver Enzymes Recent Labs  Lab 08/09/18 0457  AST 13*  ALT 17  ALKPHOS 34*  BILITOT 0.5  ALBUMIN 2.3*    Electrolytes Recent Labs  Lab 08/06/18 0538 08/07/18 0500 08/08/18 0506 08/09/18 0457  CALCIUM 8.7* 8.8* 8.2* 8.6*  MG 2.0 1.9 2.1  --   PHOS 2.5 2.5 2.5  --     CBC Recent Labs  Lab 08/07/18 0500 08/08/18 0506 08/09/18 0457  WBC 11.0* 5.6 7.2  HGB 13.9 11.3* 10.9*  HCT 44.1 35.1* 35.3*  PLT 384 331 337    ABG No results for input(s): PHART, PCO2ART, PO2ART in the last 168 hours.  Coag's No results for input(s): APTT, INR in the last 168 hours.  Sepsis Markers No results for input(s): LATICACIDVEN, PROCALCITON, O2SATVEN in the last 168 hours.  Cardiac Enzymes No results for input(s): TROPONINI, PROBNP in the last 168 hours.  08/10/18,  AGACNP-BC [facility] [facility] Pager [phone]    08/09/2018 8:53 AM

## 2018-08-09 NOTE — Progress Notes (Signed)
Patient performed a NIF -18 with fair effort and VC 700 mL with good effort .

## 2018-08-09 NOTE — Progress Notes (Signed)
[facility] P[facility]  NIF today -18, VC 600cc   Both of these values are very low, indicative of severe respiratory muscle weakness.  I informed the patient we are still waiting for the results of the myasthenia labs.  May want to consider empiric IVIg or plasmapheresis.  Will need tracheostomy as I don't see his respiratory failure improving soon.  The patient and his wife voiced understanding.  Heber Lonepine, MD [place] [facility] Pager: [phone] Cell: [phone] After 3pm or if no response, call [phone]

## 2018-08-09 NOTE — Progress Notes (Signed)
Performed NIF & VC maneuvers.  Pt gave fair effort after repeated coaching.  NIF of -18 and VC of ~ 600 mL.

## 2018-08-09 NOTE — Progress Notes (Addendum)
Physical Therapy Treatment Patient Details Name: Wayne Young MRN: [id number] DOB: 1956/03/12 Today's Date: 08/09/2018    History of Present Illness 62 y.o. male admitted with dyspnea and respiratory failure requiring mechanical ventilation. Extubated 10/11, re-intubated 10/13. Has experienced gradual weakness with falls since this past June per family, neurology work up for MG/neuromuscular etiology pending at time of PT evaluation. PMH includes but not limted to: COPD, RA, unexplained weight loss.     PT Comments    Pt is self limiting due to anxiety with mobility/breathing.  He reported that he had a breathing "test" later today that he did not want to be too tired to complete (per RT it is a NIF/ VC test).  He did very well yesterday and has the strength to get up OOB to the chair on the vent, but he reports he did too much yesterday (even though we only followed his lead to stand and side step at EOB) and was weak and tired all day today so he was only agreeable to chair position and LE exercises today.  PT will continue to encourage safe mobility progression in hopes to get him OOB to the recliner chair next session.   Follow Up Recommendations  CIR     Equipment Recommendations  None recommended by PT    Recommendations for Other Services   NA     Precautions / Restrictions Precautions Precautions: Fall;Other (comment) Precaution Comments: Intubated and anxious    Mobility  Bed Mobility               General bed mobility comments: Pt declined EOB today, reporting that he worked too hard yesterday and got too tired (although, interestingly enough, it was his idea to stand).  He is perseverating on his pending ventilation/profusion "test" that he reports he has later today.  He reports he doesn't want to be too fatigued for that "test".          Cognition Arousal/Alertness: Awake/alert Behavior During Therapy: Anxious Overall Cognitive Status: Within Functional  Limits for tasks assessed                                 General Comments: Pt is very anxious, worried about his breathing and his potential "nerve" diagnosis.  He needs lots of gentle persuation and encouragment.       Exercises General Exercises - Lower Extremity Long Arc Quad: AROM;Both;20 reps Hip Flexion/Marching: AROM;Both;20 reps Toe Raises: AROM;Both;20 reps Heel Raises: AROM;Both;20 reps    General Comments General comments (skin integrity, edema, etc.): Positioned bed in chair mode and preformed LE exercises, repositioned pt higher in the bed.  I told pt we would come see him tomorrow for a goal of OOB to chair.  His VSS throughout treatment.  Vent was on PRVC at 70% FiO2 and PEEP 5.  RR and O2 sats were stable during gait.  He asked to be deep suctioned at the end of our session and RT gave him a breathing treatment just before we starteed mobilizing.        Pertinent Vitals/Pain Pain Assessment: No/denies pain           PT Goals (current goals can now be found in the care plan section) Acute Rehab PT Goals Patient Stated Goal: "Return to normal" Progress towards PT goals: Not progressing toward goals - comment(pt is self limiting. )    Frequency    Min 3X/week  PT Plan Current plan remains appropriate       AM-PAC PT "6 Clicks" Daily Activity  Outcome Measure  Difficulty turning over in bed (including adjusting bedclothes, sheets and blankets)?: Unable Difficulty moving from lying on back to sitting on the side of the bed? : Unable Difficulty sitting down on and standing up from a chair with arms (e.g., wheelchair, bedside commode, etc,.)?: Unable Help needed moving to and from a bed to chair (including a wheelchair)?: A Little Help needed walking in hospital room?: A Little Help needed climbing 3-5 steps with a railing? : A Lot 6 Click Score: 11    End of Session Equipment Utilized During Treatment: Oxygen;Other (comment)(vent: FiO2  70%, PEEP 5) Activity Tolerance: Other (comment)(Pt is self limiting, anxious.) Patient left: in bed;with call bell/phone within reach;with family/visitor present Nurse Communication: Mobility status PT Visit Diagnosis: Unsteadiness on feet (R26.81);Muscle weakness (generalized) (M62.81);History of falling (Z91.81)     Time: 2130-8657 PT Time Calculation (min) (ACUTE ONLY): 30 min  Charges:  $Therapeutic Exercise: 8-22 mins $Therapeutic Activity: 8-22 mins                    [name] Huyett B. Dakin Madani, PT, DPT  Acute Rehabilitation [phone] pager #[phone] office   08/09/2018, 5:12 PM

## 2018-08-10 ENCOUNTER — Ambulatory Visit: Payer: Medicaid Other | Admitting: Pulmonary Disease

## 2018-08-10 ENCOUNTER — Inpatient Hospital Stay (HOSPITAL_COMMUNITY): Payer: Medicaid Other

## 2018-08-10 DIAGNOSIS — T17908A Unspecified foreign body in respiratory tract, part unspecified causing other injury, initial encounter: Secondary | ICD-10-CM

## 2018-08-10 DIAGNOSIS — Z9119 Patient's noncompliance with other medical treatment and regimen: Secondary | ICD-10-CM

## 2018-08-10 DIAGNOSIS — Z91199 Patient's noncompliance with other medical treatment and regimen due to unspecified reason: Secondary | ICD-10-CM

## 2018-08-10 LAB — CBC
HCT: 38.3 % — ABNORMAL LOW (ref 39.0–52.0)
Hemoglobin: 12.2 g/dL — ABNORMAL LOW (ref 13.0–17.0)
MCH: 28.8 pg (ref 26.0–34.0)
MCHC: 31.9 g/dL (ref 30.0–36.0)
MCV: 90.5 fL (ref 80.0–100.0)
Platelets: 344 K/uL (ref 150–400)
RBC: 4.23 MIL/uL (ref 4.22–5.81)
RDW: 12.8 % (ref 11.5–15.5)
WBC: 11 K/uL — ABNORMAL HIGH (ref 4.0–10.5)
nRBC: 0 % (ref 0.0–0.2)

## 2018-08-10 LAB — GLUCOSE, CAPILLARY
GLUCOSE-CAPILLARY: 155 mg/dL — AB (ref 70–99)
GLUCOSE-CAPILLARY: 163 mg/dL — AB (ref 70–99)
GLUCOSE-CAPILLARY: 167 mg/dL — AB (ref 70–99)
GLUCOSE-CAPILLARY: 212 mg/dL — AB (ref 70–99)
GLUCOSE-CAPILLARY: 249 mg/dL — AB (ref 70–99)
Glucose-Capillary: 184 mg/dL — ABNORMAL HIGH (ref 70–99)
Glucose-Capillary: 195 mg/dL — ABNORMAL HIGH (ref 70–99)

## 2018-08-10 LAB — COMPREHENSIVE METABOLIC PANEL
ALBUMIN: 2.5 g/dL — AB (ref 3.5–5.0)
ALT: 24 U/L (ref 0–44)
ANION GAP: 8 (ref 5–15)
AST: 27 U/L (ref 15–41)
Alkaline Phosphatase: 49 U/L (ref 38–126)
BUN: 17 mg/dL (ref 8–23)
CHLORIDE: 100 mmol/L (ref 98–111)
CO2: 30 mmol/L (ref 22–32)
Calcium: 8.6 mg/dL — ABNORMAL LOW (ref 8.9–10.3)
Creatinine, Ser: 0.67 mg/dL (ref 0.61–1.24)
GFR calc Af Amer: >60 mL/min (ref >60)
GFR calc non Af Amer: >60 mL/min (ref >60)
GLUCOSE: 235 mg/dL — AB (ref 70–99)
POTASSIUM: 3.7 mmol/L (ref 3.5–5.1)
SODIUM: 138 mmol/L (ref 135–145)
TOTAL PROTEIN: 6.9 g/dL (ref 6.5–8.1)
Total Bilirubin: 0.8 mg/dL (ref 0.3–1.2)

## 2018-08-10 LAB — POCT I-STAT 3, ART BLOOD GAS (G3+)
ACID-BASE EXCESS: 4 mmol/L — AB (ref 0.0–2.0)
BICARBONATE: 31.9 mmol/L — AB (ref 20.0–28.0)
O2 Saturation: 100 %
PH ART: 7.308 — AB (ref 7.350–7.450)
Patient temperature: 100.9
TCO2: 34 mmol/L — ABNORMAL HIGH (ref 22–32)
pCO2 arterial: 64.6 mmHg — ABNORMAL HIGH (ref 32.0–48.0)
pO2, Arterial: 279 mmHg — ABNORMAL HIGH (ref 83.0–108.0)

## 2018-08-10 LAB — PROTIME-INR
INR: 1.36
PROTHROMBIN TIME: 16.6 s — AB (ref 11.4–15.2)

## 2018-08-10 LAB — ACETYLCHOLINE RECEPTOR AB, ALL
Acety choline binding ab: 0.03 nmol/L (ref 0.00–0.24)
Acetylchol Block Ab: 22 % (ref 0–25)
Acetylcholine Modulat Ab: <12 % (ref 0–20)

## 2018-08-10 LAB — TRIGLYCERIDES: Triglycerides: 47 mg/dL (ref <150)

## 2018-08-10 LAB — MAGNESIUM: Magnesium: 1.8 mg/dL (ref 1.7–2.4)

## 2018-08-10 LAB — APTT: APTT: 31 s (ref 24–36)

## 2018-08-10 MED ORDER — PROPOFOL 1000 MG/100ML IV EMUL
0.0000 ug/kg/min | INTRAVENOUS | Status: DC
  Administered 2018-08-10: 25 ug/kg/min via INTRAVENOUS
  Administered 2018-08-10: 20 ug/kg/min via INTRAVENOUS
  Administered 2018-08-11: 25 ug/kg/min via INTRAVENOUS
  Filled 2018-08-10 (×4): qty 100

## 2018-08-10 MED ORDER — VECURONIUM BROMIDE 10 MG IV SOLR
10.0000 mg | Freq: Once | INTRAVENOUS | Status: AC
  Administered 2018-08-11: 10 mg via INTRAVENOUS
  Filled 2018-08-10: qty 10

## 2018-08-10 MED ORDER — FENTANYL CITRATE (PF) 100 MCG/2ML IJ SOLN: 50.0000 ug | INTRAMUSCULAR | Status: DC | PRN

## 2018-08-10 MED ORDER — MAGNESIUM SULFATE IN D5W 1-5 GM/100ML-% IV SOLN
1.0000 g | Freq: Once | INTRAVENOUS | Status: AC
  Administered 2018-08-10: 1 g via INTRAVENOUS
  Filled 2018-08-10: qty 100

## 2018-08-10 MED ORDER — ETOMIDATE 2 MG/ML IV SOLN
40.0000 mg | Freq: Once | INTRAVENOUS | Status: AC
  Administered 2018-08-11: 20 mg via INTRAVENOUS
  Filled 2018-08-10: qty 20

## 2018-08-10 MED ORDER — PROPOFOL 500 MG/50ML IV EMUL
5.0000 ug/kg/min | Freq: Once | INTRAVENOUS | Status: DC
  Filled 2018-08-10: qty 50

## 2018-08-10 MED ORDER — POTASSIUM CHLORIDE 20 MEQ/15ML (10%) PO SOLN
20.0000 meq | ORAL | Status: AC
  Administered 2018-08-10 (×2): 20 meq via ORAL
  Filled 2018-08-10 (×2): qty 15

## 2018-08-10 MED ORDER — IMMUNE GLOBULIN (HUMAN) 10 GM/100ML IV SOLN
25.0000 g | INTRAVENOUS | Status: AC
  Administered 2018-08-10 – 2018-08-14 (×5): 25 g via INTRAVENOUS
  Filled 2018-08-10: qty 200
  Filled 2018-08-10: qty 50
  Filled 2018-08-10: qty 300
  Filled 2018-08-10 (×3): qty 200

## 2018-08-10 MED ORDER — MIDAZOLAM HCL 2 MG/2ML IJ SOLN
4.0000 mg | Freq: Once | INTRAMUSCULAR | Status: AC
  Administered 2018-08-11: 2 mg via INTRAVENOUS
  Filled 2018-08-10: qty 4

## 2018-08-10 MED ORDER — MIDAZOLAM HCL 2 MG/2ML IJ SOLN
2.0000 mg | Freq: Once | INTRAMUSCULAR | Status: AC
  Administered 2018-08-10: 2 mg via INTRAVENOUS

## 2018-08-10 MED ORDER — MIDAZOLAM HCL 2 MG/2ML IJ SOLN: 4.0000 mg | Freq: Once | INTRAMUSCULAR | Status: DC

## 2018-08-10 MED ORDER — MIDAZOLAM HCL 2 MG/2ML IJ SOLN
INTRAMUSCULAR | Status: AC
  Filled 2018-08-10: qty 2

## 2018-08-10 MED ORDER — INSULIN DETEMIR 100 UNIT/ML ~~LOC~~ SOLN
17.0000 [IU] | Freq: Every day | SUBCUTANEOUS | Status: DC
  Administered 2018-08-10: 17 [IU] via SUBCUTANEOUS
  Filled 2018-08-10 (×2): qty 0.17

## 2018-08-10 MED ORDER — FENTANYL CITRATE (PF) 100 MCG/2ML IJ SOLN: 25.0000 ug | INTRAMUSCULAR | Status: DC | PRN

## 2018-08-10 MED ORDER — FENTANYL CITRATE (PF) 100 MCG/2ML IJ SOLN
200.0000 ug | Freq: Once | INTRAMUSCULAR | Status: AC
  Administered 2018-08-11: 100 ug via INTRAVENOUS
  Filled 2018-08-10: qty 4

## 2018-08-10 MED ORDER — ETOMIDATE 2 MG/ML IV SOLN
20.0000 mg | Freq: Once | INTRAVENOUS | Status: AC
  Administered 2018-08-10: 20 mg via INTRAVENOUS

## 2018-08-10 NOTE — Progress Notes (Signed)
ABG results called to Elink.  

## 2018-08-10 NOTE — Progress Notes (Signed)
Per RN, Dr Kendrick Fries requested pt to be bagged/lavaged= done.  Pt bagged easily, thin secretions returned, no plugs noted, pt appeared to tol well.  RN aware that pt is on 80% d/t desaturation (prior to bagging/lavage).

## 2018-08-10 NOTE — Progress Notes (Signed)
PULMONARY / CRITICAL CARE MEDICINE   NAME:  Wayne Young, MRN:  [id number], DOB:  [birth year], LOS: 16 ADMISSION DATE:  07/25/2018,   CHIEF COMPLAINT: Dyspnea and declining mental status.  BRIEF HISTORY:        62 year old man, history of RA and prior methotrexate, latent TB (treated), months of unexplained weight loss.  He quit smoking 21 years ago.  Admitted with dyspnea and then respiratory failure requiring mechanical ventilation.  Treating for left lower lobe pneumonia.  He failed extubation on 10/9, experienced an episode suspicious for acute seizure and was reintubated.  Empirically on Keppra.  Brain imaging and EEG were both reassuring. Extubated 10/11 He was reintubated 10/13 for sudden respiratory distress and inability to clear secretions, concern for intermittent diplopia and weakness Self extubated and re-intubated on 10/23   SIGNIFICANT PAST MEDICAL HISTORY    Rheumatoid arthritis, positive PPD.   SIGNIFICANT EVENTS:  10/17 suction bronch, normal anatomy with thick clear mucous on left partially cleared with saline lavage 10/23 self-extubated and re-intubated  STUDIES:   CT chest angio 10/9 >> LLL consolidation CT abd  / pelvis  10/7 >> ? SMA syndrome CT neck 10/7 >> neg MRI brain 10/10 >> chronic ischemic microangiopathy  EEG 10/10 neg  MRI T/ C spine 10/13 >>  mild central disc protrusion C4-6 but no cord compression and no demyelinating lesions  10/13 myasthenia gravis labs (2-4day turnaround)> Achr binding antibody: 0.03- normal range.  Achr blocking antibody 22 - normal range Achr modulating Ab is still pending. (call lab 10/23 11am to verify, [facility] [phone] spec id [id number])  CULTURES:  resp 10/14 >>> enterobacter >> CTX sens  ANTIBIOTICS:  Zosyn 10/7 >> 10/17 CTX  10/17 >>>> 10/21  LINES/TUBES:  ETT 10/7 >> 10/9, 10/9 >> 10/11 ,          10/13 >>10/23          10/23>> CONSULTANTS:  Neurology (signed off pending MG labs)  SUBJECTIVE:   Sleeping on early rounds T Max 100.9 WBC 11  CONSTITUTIONAL: BP 99/68   Pulse 98   Temp (!) 100.9 F (38.3 C) (Rectal)   Resp 19   Ht 5\' 11"  (1.803 m)   Wt 56.5 kg   SpO2 99%   BMI 17.37 kg/m   I/O last 3 completed shifts: In: 2653.3 [I.V.:34.7; NG/GT:2065; IV Piggyback:553.6] Out: 2385 [Urine:2385]     Vent Mode: PRVC FiO2 (%):  [40 %-100 %] 80 % Set Rate:  [14 bmp-16 bmp] 16 bmp Vt Set:  [600 mL] 600 mL PEEP:  [5 cmH20-10 cmH20] 8 cmH20 Pressure Support:  [10 cmH20] 10 cmH20 Plateau Pressure:  [18 cmH20-23 cmH20] 23 cmH20  PHYSICAL EXAM:  Elderly black male, frail and deconditioned, orally intubated S1-S2 normal Bilateral chest excursion, coarse throughout, decreased air movement on right base Soft abdomen,  ND  Today's chest x-ray personally reviewed shows improved aeration of left lung with decreased right aeration (likely atelectasis)  RESOLVED PROBLEM LIST   ASSESSMENT AND PLAN    Acute resp failure  -recurrent, etiology unclear -no evidence for neuromuscular disease , no evidence for vocal cord paralysis during intubation,  ? recurrent aspiration.    Left lower lobe atelectasis  Due to secretions, status post bronchoscopy  - No reserve 2/2 deconditioning - Continue weaning trials as able -unable to wean succesfully off vent - Follow acetylcholine receptor modulating antibody results but doubt myasthenia, although NIF -18 on 10/22 is concerning - likely tracheostomy soon - CXR prn - Sat  goals of > 94% - Chest PT as able - PT/OT - Swallow eval once extubated - down 13lbs this admission  Aspiration gram-negative pneumonia- Now with improved left lung and increased consolidation lower right on CXR 10/23 WBC 11 T max 100.9 Trend CBC/ fever curve Monitor off abx for now Trend CXR  GI Protein calorie malnutrition/weight loss -whole-body imaging negative, no clear etiology apparent, no evidence of active TB No BM since 10/18  Plan - ct tube  feeds -Refeeding syndrome-repleted hypophosphatemia and hypomagnesemia hypokalemia - Trend mag/ phos - Add prn biscodyl  Renal Adequate output Plan Replete electrolytes prn Trend BMET  Endocrine DM Plan CBG's Q 4 SSI Increase Detemir to 17 U  SUMMARY OF TODAY'S PLAN:   Continue to work on weaning with goal of extubation, trach appearing more likely Awaiting acetylcholine testing results>> will call lab at 11am Replete phos  and K as needed when CMP results Increase Detimir to 17 Units  Best Practice / Goals of Care / Disposition.    DVT PROPHYLAXIS: Lovenox SUP: Pepcid Updated wife at bedside daily   LABS  Glucose Recent Labs  Lab 08/09/18 0759 08/09/18 1137 08/09/18 1510 08/09/18 1926 08/10/18 0001 08/10/18 0342  GLUCAP 199* 234* 192* 137* 184* 195*    BMET Recent Labs  Lab 08/08/18 0506 08/09/18 0457 08/10/18 0528  NA 139 137 138  K 3.3* 3.6 3.7  CL 108 104 100  CO2 27 27 30   BUN 22 19 17   CREATININE 0.51* 0.65 0.67  GLUCOSE 195* 218* 235*    Liver Enzymes Recent Labs  Lab 08/09/18 0457 08/10/18 0528  AST 13* 27  ALT 17 24  ALKPHOS 34* 49  BILITOT 0.5 0.8  ALBUMIN 2.3* 2.5*    Electrolytes Recent Labs  Lab 08/06/18 0538 08/07/18 0500 08/08/18 0506 08/09/18 0457 08/10/18 0528  CALCIUM 8.7* 8.8* 8.2* 8.6* 8.6*  MG 2.0 1.9 2.1  --  1.8  PHOS 2.5 2.5 2.5  --   --     CBC Recent Labs  Lab 08/08/18 0506 08/09/18 0457 08/10/18 0528  WBC 5.6 7.2 11.0*  HGB 11.3* 10.9* 12.2*  HCT 35.1* 35.3* 38.3*  PLT 331 337 344    ABG Recent Labs  Lab 08/10/18 0540  PHART 7.308*  PCO2ART 64.6*  PO2ART 279.0*    Coag's No results for input(s): APTT, INR in the last 168 hours.  Sepsis Markers No results for input(s): LATICACIDVEN, PROCALCITON, O2SATVEN in the last 168 hours.  Cardiac Enzymes No results for input(s): TROPONINI, PROBNP in the last 168 hours.  08/12/18, DO  PGY2 [place] 08/10/2018 7:19 AM     08/10/2018

## 2018-08-10 NOTE — Progress Notes (Addendum)
Subjective: Patient currently is intubated and slightly sedated with propofol but cannot follow commands.  Majority of the subjective portion was received by the wife at bedside.  To recap his history, patient has had 4 to 5 months of difficulty breathing and she noted as he would talk he would become hypophonic.  It was then noticed that he has some difficulty breathing and he was brought here 3 weeks ago.  His breathing worsened and he was placed on the ventilator.  She states that over the past 3 months he would become weak and basically would walk to the bathroom and then to the bed.  He also was noted over the past 4 to 5 months to have decreased appetite and not eating.  He had no problem with swallowing he simply had no appetite.  Wife does note that he would become progressively weaker the longer he walks, the more he talked.   Exam: Vitals:   08/10/18 0900 08/10/18 0930  BP:  100/66  Pulse: 93 84  Resp:    Temp:    SpO2: 93% 100%    Physical Exam   HEENT-  Normocephalic, no lesions, without obvious abnormality.  Normal external eye and conjunctiva.   Extremities- Warm, dry and intact Musculoskeletal-no joint tenderness, joint deformities in the knees Skin-warm and dry, no hyperpigmentation, vitiligo, or suspicious lesions    Neuro:  Mental Status: Patient is alert, on propofol and although slightly sedated but quickly open his eyes and follow commands. Cranial Nerves: II:  Visual fields grossly normal,  III,IV, VI: Difficulty assessing ptosis however when looking at practitioner he does appear to have eyelid drooping.  During prolonged upper gaze there possibly could be some increased ptosis however due to him being on propofol this was difficult to evaluate.  Ice pack test was used however again due to his sedation no clear diagnosis could be made whether or not it worked., extra-ocular motions intact bilaterally pupils equal, round, reactive to light and accommodation V,VII:  Intubated but sensation is intact VIII: hearing normal bilaterally  Motor: Moving all extremities antigravity with good strength No significant wasting,normal tone Sensory: Pinprick and light touch intact throughout, bilaterally Deep Tendon Reflexes: Deep tendon reflexes were 2+ throughout Plantars: Right: downgoing   Left: downgoing     Medications:  Scheduled: . amLODipine  10 mg Per Tube Daily  . brimonidine  2 drop Both Eyes TID  . chlorhexidine gluconate (MEDLINE KIT)  15 mL Mouth Rinse BID  . Chlorhexidine Gluconate Cloth  6 each Topical Daily  . docusate  100 mg Per Tube BID  . dorzolamide-timolol  2 drop Both Eyes BID  . enoxaparin (LOVENOX) injection  40 mg Subcutaneous Q24H  . famotidine  20 mg Per Tube BID  . guaiFENesin  10 mL Oral Q4H  . insulin aspart  0-9 Units Subcutaneous Q4H  . insulin detemir  17 Units Subcutaneous Daily  . ipratropium-albuterol  3 mL Nebulization TID  . mouth rinse  15 mL Mouth Rinse 10 times per day  . midazolam  2 mg Intravenous Once  . sodium chloride flush  10-40 mL Intracatheter Q12H  . sodium chloride HYPERTONIC  4 mL Nebulization TID    Pertinent Labs/Diagnostics: -Awaiting acetylcholine receptor labs - We will order musk antibodies along with calcium gated channel antibodies  Dg Chest Port 1 View  Result Date: 08/10/2018 CLINICAL DATA:  Intubation EXAM: PORTABLE CHEST 1 VIEW COMPARISON:  08/09/2018 FINDINGS: Support Apparatus: --Endotracheal tube: Tip just below the level  of the clavicular heads. --Enteric tube:Tip and side port below the diaphragm. --Catheter(s):Right PICC line tip at the cavoatrial junction --Other: None Improved aeration of the left lower lobe. There is increased consolidation at the right lung base, likely atelectasis. IMPRESSION: 1. Unchanged support apparatus. 2. Improved aeration of the left lower lobe. 3. Worsened aeration of the right lower lobe, likely indicating atelectasis. Electronically Signed   By:  Ulyses Jarred M.D.   On: 08/10/2018 04:53   Dg Chest Port 1 View  Result Date: 08/09/2018 CLINICAL DATA:  Endotracheal intubation. EXAM: PORTABLE CHEST 1 VIEW COMPARISON:  Radiograph of August 08, 2018. FINDINGS: Stable cardiomediastinal silhouette. Endotracheal and nasogastric tubes are unchanged in position. Right-sided PICC line is unchanged in position. Stable left lower lobe pneumonia is noted. Mild right basilar subsegmental atelectasis is noted. Bony thorax is unremarkable. IMPRESSION: Stable support apparatus.  Stable left lower lobe pneumonia. Electronically Signed   By: Marijo Conception, M.D.   On: 08/09/2018 07:39     Etta Quill PA-C [facility] [phone]      08/10/2018, 10:13 AM  NEUROHOSPITALIST ADDENDUM Performed a face to face diagnostic evaluation.   I have reviewed the contents of history and physical exam as documented by PA/ARNP/Resident and agree with above documentation.  I have discussed and formulated the above plan as documented below. Edits to the note have been made as needed.   ASSESSMENT AND PLAN  62 year old male with progressive fatigue, shortness of breath, hypophonia, significant unintentional weight loss per wife presents with acute hypercarbic respiratory failure requiring intubation.  Attempts at weaning off ventilator have been unsuccessful.  Critical care team concerned about neuromuscular weakness as cause for persistent hypercarbic respiratory failure.  Most recent NIF was -18. Patient does not have any significant show any weakness, reflexes appear to be intact making this less likely to be due to deconditioning, CIDP of critical illness neuropathy.  Lab work showed slightly elevated TSH, elevated A1c at 8.1. Negative RPR, HIV, hepatitis B and C.  Negative for TB. CK is normal and all 3 acetylcholine receptor antibodies negative.  B1 was normal as well as vitamin B12 normal.  Impression  Possible Neuromuscular cause for  Hypercarbic Respiratory failure, Dysphagia and Hypophonia D/D include Rita Ohara syndrome vs seronegative MG ( all 3 ACHR ab negative)   Plan Empiric IVIG x 5 days ( discussed with wife and patient risks and benefit) Will order Voltage gated calcium channel ab to test for [employer] syndrome as well as Anti - MUSK ab  Will need single fiber EMG/RNS testing as outpatient     Anastazia Creek MD [facility] [phone]   If 7pm to 7am, please call on call as listed on AMION.

## 2018-08-10 NOTE — Progress Notes (Signed)
PT Cancellation Note  Patient Details Name: Dagen Beevers MRN: [id number] DOB: [birth year]   Cancelled Treatment:    Reason Eval/Treat Not Completed: Medical issues which prohibited therapy per RN self extubated, re intubated and sedated. Will cont to follow and work with patient when appropriate.   Etta Grandchild, PT, DPT Acute Rehabilitation Services Pager: [phone] Office: [phone]     Etta Grandchild 08/10/2018, 2:58 PM

## 2018-08-10 NOTE — Progress Notes (Signed)
RT called to room as patient had self extubated. Upon arrival, patient with assisted breathing by another RT. MD called and preparing for reintubation.

## 2018-08-10 NOTE — Procedures (Signed)
Intubation Procedure Note Wayne Young [id number] June 15, 1956  Procedure: Intubation Indications: Respiratory insufficiency  Event: Self extubated at 4:00AM    Procedure Details Consent: Unable to obtain consent because of altered level of consciousness. Time Out: Verified patient identification, verified procedure, site/side was marked, verified correct patient position, special equipment/implants available, medications/allergies/relevent history reviewed, required imaging and test results available.  Performed  Maximum sterile technique was used including antiseptics, gloves, hand hygiene and mask.  MAC and 4  RSI: Versed 2 mg and Etomidate 20 mg   Evaluation Hemodynamic Status: BP stable throughout; O2 sats: stable throughout Patient's Current Condition: stable Complications: No apparent complications Patient did tolerate procedure well. Chest X-ray ordered to verify placement.  CXR: pending.   Rise Paganini [place]fe 08/10/2018

## 2018-08-10 NOTE — Clinical Social Work Note (Signed)
Clinical Social Work Assessment  Patient Details  Name: Wayne Young MRN: [id number] Date of Birth: 1956/04/27  Date of referral:  08/10/18               Reason for consult:  Facility Placement, Discharge Planning                Permission sought to share information with:  Family Supports Permission granted to share information::  Yes, Verbal Permission Granted  Name::     Charity fundraiser   Agency::  family  Relationship::  niece  Contact Information:  Seydina Holliman  Housing/Transportation Living arrangements for the past 2 months:  Single Family Home(with wife. ) Source of Information:  Spouse Patient Interpreter Needed:  None Criminal Activity/Legal Involvement Pertinent to Current Situation/Hospitalization:  No - Comment as needed Significant Relationships:  Adult Children, Spouse Lives with:  Spouse Do you feel safe going back to the place where you live?  Yes Need for family participation in patient care:  Yes (Comment)  Care giving concerns:  CSW consulted for SNF placement as a back up with CIR.    Social Worker assessment / plan:  CSW spoke with pt's wife at bedside as pt is now intubated again. CSW was informed that pt is from home with wife. Wife reports that she and pt usually help care for one another as needed. Pt has support from adult children and spouse at this time.  CSW spoke with pt's wife about discharge planning needs and wife expressed that she would think about further needs in care as pt progressed. CSW informed wife that CSW is available to speak with her if any questions arise.   Employment status:  Retired Health and safety inspector:  [employer] PT Recommendations:  Inpatient Rehab Consult, Skilled Nursing Facility Information / Referral to community resources:  Skilled Nursing Facility  Patient/Family's Response to care: Pt's wife appeared to be understanding and agreeable to plan of care at this time. Wife appeared to be a little stressed but  hopeful for pt to recover and get back home.     Patient/Family's Understanding of and Emotional Response to Diagnosis, Current Treatment, and Prognosis: At this time no further questions or concerns have been presented to CSW.   Emotional Assessment Appearance:  Appears stated age Attitude/Demeanor/Rapport:  Unable to Assess Affect (typically observed):  Unable to Assess Orientation:  (ot reintuabted at this time. ) Alcohol / Substance use:  Not Applicable Psych involvement (Current and /or in the community):  No (Comment)  Discharge Needs  Concerns to be addressed:  Care Coordination Readmission within the last 30 days:  No Current discharge risk:  Dependent with Mobility Barriers to Discharge:  Continued Medical Work up   [employer], LCSWA 08/10/2018, 9:48 AM

## 2018-08-11 ENCOUNTER — Inpatient Hospital Stay (HOSPITAL_COMMUNITY): Payer: Medicaid Other

## 2018-08-11 LAB — PROTIME-INR
INR: 1.31
Prothrombin Time: 16.1 seconds — ABNORMAL HIGH (ref 11.4–15.2)

## 2018-08-11 LAB — GLUCOSE, CAPILLARY
GLUCOSE-CAPILLARY: 158 mg/dL — AB (ref 70–99)
GLUCOSE-CAPILLARY: 128 mg/dL — AB (ref 70–99)
Glucose-Capillary: 158 mg/dL — ABNORMAL HIGH (ref 70–99)
Glucose-Capillary: 117 mg/dL — ABNORMAL HIGH (ref 70–99)

## 2018-08-11 LAB — BASIC METABOLIC PANEL
Anion gap: 6 (ref 5–15)
BUN: 18 mg/dL (ref 8–23)
CALCIUM: 8.5 mg/dL — AB (ref 8.9–10.3)
CO2: 28 mmol/L (ref 22–32)
CREATININE: 0.51 mg/dL — AB (ref 0.61–1.24)
Chloride: 103 mmol/L (ref 98–111)
GFR calc Af Amer: >60 mL/min (ref >60)
GLUCOSE: 193 mg/dL — AB (ref 70–99)
Potassium: 3.7 mmol/L (ref 3.5–5.1)
Sodium: 137 mmol/L (ref 135–145)

## 2018-08-11 LAB — CBC
HEMATOCRIT: 33.9 % — AB (ref 39.0–52.0)
HEMOGLOBIN: 10.8 g/dL — AB (ref 13.0–17.0)
MCH: 28.9 pg (ref 26.0–34.0)
MCHC: 31.9 g/dL (ref 30.0–36.0)
MCV: 90.6 fL (ref 80.0–100.0)
Platelets: 319 10*3/uL (ref 150–400)
RBC: 3.74 MIL/uL — ABNORMAL LOW (ref 4.22–5.81)
RDW: 12.9 % (ref 11.5–15.5)
WBC: 6.4 10*3/uL (ref 4.0–10.5)
nRBC: 0 % (ref 0.0–0.2)

## 2018-08-11 LAB — APTT: APTT: 33 s (ref 24–36)

## 2018-08-11 MED ORDER — GUAIFENESIN 100 MG/5ML PO SOLN
10.0000 mL | ORAL | Status: DC
  Administered 2018-08-12 – 2018-08-20 (×51): 200 mg
  Filled 2018-08-11 (×61): qty 10

## 2018-08-11 MED ORDER — ENOXAPARIN SODIUM 40 MG/0.4ML ~~LOC~~ SOLN
40.0000 mg | SUBCUTANEOUS | Status: DC
  Administered 2018-08-11 – 2018-08-20 (×10): 40 mg via SUBCUTANEOUS
  Filled 2018-08-11 (×10): qty 0.4

## 2018-08-11 MED ORDER — ACETAMINOPHEN 650 MG RE SUPP
650.0000 mg | RECTAL | Status: DC | PRN
  Administered 2018-08-12: 650 mg via RECTAL
  Filled 2018-08-11: qty 1

## 2018-08-11 NOTE — Progress Notes (Signed)
RT NOTE:  RT called due to patient desaturation following reposition in bed. Pt was positioned with left side elevated and SpO2 was 79% w/ good pleth when RT arrived. O2 breaths given and lavage/suction done with no improvement. Pt taken off vent and bagged @ 100% w/ Peep valve @ 15. SpO2 only increased to 85% over 2 minutes. RT checked ETCO2 for trach placement with positive return. RT recommended RN remove pillows from patient left side and SpO2 returned to 96%. FIO2 increased to 100% and RT will monitor and decrease as tolerated by patient.

## 2018-08-11 NOTE — Progress Notes (Signed)
Attending:    Subjective: No acute events  Objective: Vitals:   08/11/18 0900 08/11/18 1000 08/11/18 1120 08/11/18 1139  BP: 121/83 134/87 130/84   Pulse: 64 (!) 35 91   Resp:   16   Temp:    98.8 F (37.1 C)  TempSrc:    Oral  SpO2: (!) 88% 95% 98%   Weight:      Height:       Vent Mode: PRVC FiO2 (%):  [50 %-100 %] 100 % Set Rate:  [16 bmp] 16 bmp Vt Set:  [600 mL] 600 mL PEEP:  [8 cmH20] 8 cmH20 Plateau Pressure:  [22 cmH20-23 cmH20] 23 cmH20  Intake/Output Summary (Last 24 hours) at 08/11/2018 1205 Last data filed at 08/11/2018 1100 Gross per 24 hour  Intake 1434.41 ml  Output 810 ml  Net 624.41 ml    General:  In bed on vent HENT: NCAT ETT in place PULM: CTA B, vent supported breathing CV: RRR, no mgr GI: BS+, soft, nontender MSK: normal bulk and tone Neuro: sedated on vent   CBC    Component Value Date/Time   WBC 6.4 08/11/2018 0427   RBC 3.74 (L) 08/11/2018 0427   HGB 10.8 (L) 08/11/2018 0427   HCT 33.9 (L) 08/11/2018 0427   PLT 319 08/11/2018 0427   MCV 90.6 08/11/2018 0427   MCH 28.9 08/11/2018 0427   MCHC 31.9 08/11/2018 0427   RDW 12.9 08/11/2018 0427   LYMPHSABS 1.5 08/01/2018 0426   MONOABS 0.6 08/01/2018 0426   EOSABS 0.3 08/01/2018 0426   BASOSABS 0.0 08/01/2018 0426    BMET    Component Value Date/Time   NA 137 08/11/2018 0427   K 3.7 08/11/2018 0427   CL 103 08/11/2018 0427   CO2 28 08/11/2018 0427   GLUCOSE 193 (H) 08/11/2018 0427   BUN 18 08/11/2018 0427   CREATININE 0.51 (L) 08/11/2018 0427   CALCIUM 8.5 (L) 08/11/2018 0427   GFRNONAA >60 08/11/2018 0427   GFRAA >60 08/11/2018 0427    CXR images personally reviewed,   Impression/Plan: ACute resppiratory fialure with hypoxemia due to neuromuscular weakness: IVIg, Trach today, continue full vent support, VAP prevention, routine trach care, continue daily NIF, FVC Neuromuscular weakness: unclear etiology, appreciate neuro, continue IVIg   My cc time 35  minutes  [employer], MD  PCCM Pager: [phone] Cell: [phone] After 3pm or if no response, call [phone]

## 2018-08-11 NOTE — Procedures (Signed)
Bedside Tracheostomy Insertion Procedure Note   Patient Details:   Name: Wayne Young DOB: [birth year] MRN: [id number]  Procedure: Tracheostomy  Pre Procedure Assessment: ET Tube Size: 8.0 ET Tube secured at lip (cm): 25 Bite block in place: No Breath Sounds: Rhonch  Post Procedure Assessment: BP 130/84   Pulse 91   Temp 98.8 F (37.1 C) (Oral)   Resp 16   Ht 5\' 11"  (1.803 m)   Wt 61.3 kg   SpO2 98%   BMI 18.85 kg/m  O2 sats: stable throughout Complications: No apparent complications Patient did tolerate procedure well Tracheostomy Brand:Shiley Tracheostomy Style:Cuffed Tracheostomy Size: 6 Tracheostomy Secured , velcro Tracheostomy Placement Confirmation:Trach cuff visualized and in place and Chest X ray ordered for placement    LDJ:[id number] 08/11/2018, 12:04 PM

## 2018-08-11 NOTE — Procedures (Signed)
Percutaneous Tracheostomy Placement  Consent from family.  Patient sedated, paralyzed and position.  Placed on 100% FiO2 and RR matched.  Area cleaned and draped.  Lidocaine/epi injected.  Skin incision done followed by blunt dissection.  Trachea palpated then punctured, catheter passed and visualized bronchoscopically.  Wire placed and visualized.  Catheter removed.  Airway then entered and dilated.  Size 6 cuffed shiley trach placed and visualized bronchoscopically well above carina.  Good volume returns.  Patient tolerated the procedure well without complications.  Minimal blood loss.  CXR ordered and pending.  Nyzier Boivin G. Alvetta Hidrogo, M.D. [facility]. Pager: [phone]. After hours pager: [phone].  

## 2018-08-11 NOTE — Progress Notes (Signed)
PULMONARY / CRITICAL CARE MEDICINE   NAME:  Wayne Young, MRN:  [id number], DOB:  [birth year], LOS: 17 ADMISSION DATE:  07/25/2018,   CHIEF COMPLAINT: Dyspnea and declining mental status.  BRIEF HISTORY:        62 year old man, history of RA and prior methotrexate, latent TB (treated), months of unexplained weight loss.  He quit smoking 21 years ago.  Admitted with dyspnea and then respiratory failure requiring mechanical ventilation.  Treating for left lower lobe pneumonia.  He failed extubation on 10/9, experienced an episode suspicious for acute seizure and was reintubated.  Empirically on Keppra.  Brain imaging and EEG were both reassuring. Extubated 10/11 He was reintubated 10/13 for sudden respiratory distress and inability to clear secretions, concern for intermittent diplopia and weakness Self extubated and re-intubated on 10/23 Starting empiric IVIG treatment x5 on 10/24 for suspicion of MG/LE   SIGNIFICANT PAST MEDICAL HISTORY    Rheumatoid arthritis, positive PPD.  SIGNIFICANT EVENTS:  10/17 suction bronch, normal anatomy with thick clear mucous on left partially cleared with saline lavage 10/23 self-extubated and re-intubated  STUDIES:   CT chest angio 10/9 >> LLL consolidation CT abd  / pelvis  10/7 >> ? SMA syndrome CT neck 10/7 >> neg MRI brain 10/10 >> chronic ischemic microangiopathy  EEG 10/10 neg  MRI T/ C spine 10/13 >>  mild central disc protrusion C4-6 but no cord compression and no demyelinating lesions  10/13 myasthenia gravis labs (2-4day turnaround)> Achr binding antibody: 0.03- normal range.  Achr blocking antibody 22 - normal range Achr modulating Ab is still pending. (called lab 10/23 11am to verify, [facility] [id number] spec id [id number], was told no results until Monday)  CULTURES:  resp 10/14 >>> enterobacter >> CTX sens  ANTIBIOTICS:  Zosyn 10/7 >> 10/17 CTX  10/17 >>>> 10/21  LINES/TUBES:  ETT 10/7 >> 10/9, 10/9 >> 10/11 ,          10/13  >>10/23          10/23>> CONSULTANTS:  Neurology >>  SUBJECTIVE:  Sleeping on rounds T Max 99.5 overnight 10/23 WBC down to 6.4  CONSTITUTIONAL: BP 136/78   Pulse 96   Temp 98.8 F (37.1 C) (Oral)   Resp (!) 21   Ht 5\' 11"  (1.803 m)   Wt 61.3 kg   SpO2 100%   BMI 18.85 kg/m   I/O last 3 completed shifts: In: 2398.9 [I.V.:204.1; NG/GT:1795; IV Piggyback:399.8] Out: 1375 [Urine:1375]     Vent Mode: PRVC FiO2 (%):  [50 %-100 %] 100 % Set Rate:  [16 bmp] 16 bmp Vt Set:  [600 mL] 600 mL PEEP:  [8 cmH20] 8 cmH20 Plateau Pressure:  [22 cmH20-25 cmH20] 23 cmH20  PHYSICAL EXAM:  Elderly black male, frail and deconditioned, orally intubated.  Was sleeping on exam, woke up, smiled, went back to sleep S1-S2 normal Bilateral chest excursion, coarse throughout, decreased air movement on right base similar to 10/23 Soft abdomen,  ND  CXR 10/24 with right sided consolidation  RESOLVED PROBLEM LIST   ASSESSMENT AND PLAN    Acute resp failure  -recurrent, etiology unclear -no evidence for neuromuscular disease , no evidence for vocal cord paralysis during intubation,  ? recurrent aspiration.    Left lower lobe atelectasis  Due to secretions, status post bronchoscopy  - No reserve 2/2 deconditioning - unable to wean succesfully off vent, NPO for trach today - Follow acetylcholine receptor modulating antibody results but clinically concerning for myasthenia vs LE,  NIF -18  on 10/22 is concerning - Sat goals of > 94% - Chest PT as able - PT/OT - Swallow eval once extubated - down 13lbs this admission -hold  Aspiration gram-negative pneumonia- Now with improved left lung and increased consolidation lower right on CXR 10/23 WBC 6.4 Afebrile overnight 10/23 Trend CBC/ fever curve Monitor off abx for now Trend CXR  GI Protein calorie malnutrition/weight loss -whole-body imaging negative, no clear etiology apparent, no evidence of active TB No BM since 10/18 Plan - ct  tube feeds -Refeeding syndrome-repleted hypophosphatemia and hypomagnesemia hypokalemia - Trend mag/ phos - prn biscodyl  Renal Adequate output Plan Replete electrolytes prn Trend BMET  Endocrine DM Plan CBG's Q 4 SSI hold Detemir 17 U due to NPO 2/2 scheduled trach  SUMMARY OF TODAY'S PLAN:    trach today Awaiting acetylcholine testing results>> will call lab monday Replete phos  and K as needed Hold long acting insulin in light of NPO for trach today *restart anticoagulant after procedure  Best Practice / Goals of Care / Disposition.    DVT PROPHYLAXIS: Lovenox being held for trach SUP: Pepcid Updated wife at bedside daily   LABS  Glucose Recent Labs  Lab 08/10/18 0732 08/10/18 1131 08/10/18 1519 08/10/18 1938 08/10/18 2342 08/11/18 0339  GLUCAP 249* 167* 155* 212* 163* 158*    BMET Recent Labs  Lab 08/09/18 0457 08/10/18 0528 08/11/18 0427  NA 137 138 137  K 3.6 3.7 3.7  CL 104 100 103  CO2 27 30 28   BUN 19 17 18   CREATININE 0.65 0.67 0.51*  GLUCOSE 218* 235* 193*    Liver Enzymes Recent Labs  Lab 08/09/18 0457 08/10/18 0528  AST 13* 27  ALT 17 24  ALKPHOS 34* 49  BILITOT 0.5 0.8  ALBUMIN 2.3* 2.5*    Electrolytes Recent Labs  Lab 08/06/18 0538 08/07/18 0500 08/08/18 0506 08/09/18 0457 08/10/18 0528 08/11/18 0427  CALCIUM 8.7* 8.8* 8.2* 8.6* 8.6* 8.5*  MG 2.0 1.9 2.1  --  1.8  --   PHOS 2.5 2.5 2.5  --   --   --     CBC Recent Labs  Lab 08/09/18 0457 08/10/18 0528 08/11/18 0427  WBC 7.2 11.0* 6.4  HGB 10.9* 12.2* 10.8*  HCT 35.3* 38.3* 33.9*  PLT 337 344 319    ABG Recent Labs  Lab 08/10/18 0540  PHART 7.308*  PCO2ART 64.6*  PO2ART 279.0*    Coag's Recent Labs  Lab 08/10/18 1626 08/11/18 0427  APTT 31 33  INR 1.36 1.31    Sepsis Markers No results for input(s): LATICACIDVEN, PROCALCITON, O2SATVEN in the last 168 hours.  Cardiac Enzymes No results for input(s): TROPONINI, PROBNP in the last 168  hours.  08/12/18, DO  PGY2 [facility] 08/11/2018 7:19 AM

## 2018-08-11 NOTE — Procedures (Signed)
PCCM Video Bronchoscopy Procedure Note  The patient was informed of the risks (including but not limited to bleeding, infection, respiratory failure, lung injury, tooth/oral injury) and benefits of the procedure and gave consent, see chart.  Indication: acute respiratory failure with hypoxemia: visualization for tracheostomy  Post Procedure Diagnosis: same  Location: [facility]  Condition pre procedure: critically ill, on vent  Medications for procedure: versed, propofol, vecuronium  Procedure description: The bronchoscope was introduced through the endotracheal tube and passed to the bilateral lungs to the level of the subsegmental bronchi throughout the tracheobronchial tree.  Airway exam revealed thick clear mucus stuck in the bronchus intermedius occluding the RML and RLL.  After removing the mucus the scope was used to guide the percutaneous tracheostomy procedure.  Procedures performed: suctioning of mucus from bronchus intermedius  Specimens sent: none  Condition post procedure: critically ill, on vent  EBL: none from bronchoscopy  Complications: none immediate  Heber Tennyson, MD [facility] Pager: [phone] Cell: [phone] After 3pm or if no response, call [phone]

## 2018-08-11 NOTE — Progress Notes (Signed)
PT Cancellation Note  Patient Details Name: Wayne Young MRN: [id number] DOB: Sep 27, 1956   Cancelled Treatment:    Reason Eval/Treat Not Completed: Medical issues which prohibited therapy trach just performed, will cont to follow.   Etta Grandchild, PT, DPT Acute Rehabilitation Services Pager: [phone] Office: [phone]     Etta Grandchild 08/11/2018, 3:19 PM

## 2018-08-12 ENCOUNTER — Inpatient Hospital Stay (HOSPITAL_COMMUNITY): Payer: Medicaid Other

## 2018-08-12 LAB — CBC
HEMATOCRIT: 34.5 % — AB (ref 39.0–52.0)
HEMOGLOBIN: 10.7 g/dL — AB (ref 13.0–17.0)
MCH: 28.4 pg (ref 26.0–34.0)
MCHC: 31 g/dL (ref 30.0–36.0)
MCV: 91.5 fL (ref 80.0–100.0)
Platelets: 351 10*3/uL (ref 150–400)
RBC: 3.77 MIL/uL — AB (ref 4.22–5.81)
RDW: 12.9 % (ref 11.5–15.5)
WBC: 6.8 10*3/uL (ref 4.0–10.5)
nRBC: 0 % (ref 0.0–0.2)

## 2018-08-12 LAB — GLUCOSE, CAPILLARY
GLUCOSE-CAPILLARY: 142 mg/dL — AB (ref 70–99)
GLUCOSE-CAPILLARY: 95 mg/dL (ref 70–99)
GLUCOSE-CAPILLARY: 98 mg/dL (ref 70–99)
Glucose-Capillary: 121 mg/dL — ABNORMAL HIGH (ref 70–99)
Glucose-Capillary: 116 mg/dL — ABNORMAL HIGH (ref 70–99)
Glucose-Capillary: 175 mg/dL — ABNORMAL HIGH (ref 70–99)
Glucose-Capillary: 278 mg/dL — ABNORMAL HIGH (ref 70–99)

## 2018-08-12 LAB — BASIC METABOLIC PANEL
ANION GAP: 10 (ref 5–15)
BUN: 15 mg/dL (ref 8–23)
CHLORIDE: 102 mmol/L (ref 98–111)
CO2: 26 mmol/L (ref 22–32)
Calcium: 8.7 mg/dL — ABNORMAL LOW (ref 8.9–10.3)
Creatinine, Ser: 0.63 mg/dL (ref 0.61–1.24)
GFR calc Af Amer: >60 mL/min (ref >60)
GLUCOSE: 123 mg/dL — AB (ref 70–99)
POTASSIUM: 3.6 mmol/L (ref 3.5–5.1)
Sodium: 138 mmol/L (ref 135–145)

## 2018-08-12 LAB — PHOSPHORUS: PHOSPHORUS: 3 mg/dL (ref 2.5–4.6)

## 2018-08-12 LAB — MAGNESIUM: MAGNESIUM: 2 mg/dL (ref 1.7–2.4)

## 2018-08-12 LAB — MISC LABCORP TEST (SEND OUT): Labcorp test code: 140640

## 2018-08-12 MED ORDER — LEVETIRACETAM IN NACL 500 MG/100ML IV SOLN
500.0000 mg | Freq: Two times a day (BID) | INTRAVENOUS | Status: DC
  Administered 2018-08-13 – 2018-08-20 (×16): 500 mg via INTRAVENOUS
  Filled 2018-08-12 (×18): qty 100

## 2018-08-12 MED ORDER — CHLORHEXIDINE GLUCONATE 0.12 % MT SOLN
OROMUCOSAL | Status: AC
  Filled 2018-08-12: qty 15

## 2018-08-12 MED ORDER — ACETAMINOPHEN 325 MG PO TABS
650.0000 mg | ORAL_TABLET | Freq: Four times a day (QID) | ORAL | Status: DC | PRN
  Administered 2018-08-12 – 2018-08-20 (×4): 650 mg
  Filled 2018-08-12 (×4): qty 2

## 2018-08-12 MED ORDER — FENTANYL CITRATE (PF) 100 MCG/2ML IJ SOLN
25.0000 ug | INTRAMUSCULAR | Status: DC | PRN
  Administered 2018-08-12: 100 ug via INTRAVENOUS
  Administered 2018-08-13: 50 ug via INTRAVENOUS
  Administered 2018-08-13 – 2018-08-18 (×6): 100 ug via INTRAVENOUS
  Filled 2018-08-12 (×8): qty 2

## 2018-08-12 MED ORDER — SODIUM CHLORIDE 3 % IN NEBU
4.0000 mL | INHALATION_SOLUTION | Freq: Two times a day (BID) | RESPIRATORY_TRACT | Status: DC
  Administered 2018-08-13 – 2018-08-20 (×15): 4 mL via RESPIRATORY_TRACT
  Filled 2018-08-12 (×18): qty 4

## 2018-08-12 NOTE — Progress Notes (Signed)
PULMONARY / CRITICAL CARE MEDICINE   NAME:  Wayne Young, MRN:  [id number], DOB:  09-29-1956, LOS: 18 ADMISSION DATE:  07/25/2018,   CHIEF COMPLAINT: Dyspnea and declining mental status.  BRIEF HISTORY:        62 year old man, history of RA and prior methotrexate, latent TB (treated), months of unexplained weight loss.  He quit smoking 21 years ago.  Admitted with dyspnea and then respiratory failure requiring mechanical ventilation.  Treating for left lower lobe pneumonia.  He failed extubation on 10/9, experienced an episode suspicious for acute seizure and was reintubated.  Empirically on Keppra.  Brain imaging and EEG were both reassuring. Extubated 10/11 He was reintubated 10/13 for sudden respiratory distress and inability to clear secretions, concern for intermittent diplopia and weakness Self extubated and re-intubated on 10/23 Starting empiric IVIG treatment x5 on 10/24 for suspicion of MG/LE  SIGNIFICANT PAST MEDICAL HISTORY    Rheumatoid arthritis, positive PPD.  SIGNIFICANT EVENTS:  10/17 suction bronch, normal anatomy with thick clear mucous on left partially cleared with saline lavage 10/23 self-extubated and re-intubated  STUDIES:   CT chest angio 10/9 >> LLL consolidation CT abd  / pelvis  10/7 >> ? SMA syndrome CT neck 10/7 >> neg MRI brain 10/10 >> chronic ischemic microangiopathy  EEG 10/10 neg  MRI T/ C spine 10/13 >>  mild central disc protrusion C4-6 but no cord compression and no demyelinating lesions  10/13 myasthenia gravis labs (2-4day turnaround)> Achr binding antibody: 0.03- normal range.  Achr blocking antibody 22 - normal range Achr modulating Ab is still pending. (called lab 10/23 11am to verify, [facility] [id number] spec id [id number], was told no results until Monday)  CULTURES:  resp 10/14 >>> enterobacter >> CTX sens  ANTIBIOTICS:  Zosyn 10/7 >> 10/17 CTX  10/17 >>>> 10/21  LINES/TUBES:  ETT 10/7 >> 10/9, 10/9 >> 10/11 ,          10/13  >>10/23          10/23>> 10/24 Trach 10/24>> CONSULTANTS:  Neurology >>  SUBJECTIVE:  Patient alert and communicating via writing during early rounds, would like to know when he can drink water T Max 99.5 overnight 10/23 WBC down to 6.4  CONSTITUTIONAL: BP 98/71   Pulse 89   Temp 99.8 F (37.7 C) (Oral)   Resp 16   Ht 5\' 11"  (1.803 m)   Wt 58.5 kg   SpO2 100%   BMI 17.99 kg/m   I/O last 3 completed shifts: In: 1975.1 [I.V.:300.1; NG/GT:1275; IV Piggyback:400] Out: 1250 [Urine:1250]     Vent Mode: PRVC FiO2 (%):  [60 %-100 %] 60 % Set Rate:  [16 bmp] 16 bmp Vt Set:  [600 mL] 600 mL PEEP:  [8 cmH20] 8 cmH20 Plateau Pressure:  [22 cmH20-23 cmH20] 22 cmH20  PHYSICAL EXAM:  Frail, black male, alert on early rounds.  Pleasant S1/2, rate wnl Improved right sided air movement from 10/24 pre-bronch/trach, left side unchanged/course Soft abdomen,  ND  CXR 10/25 with improving right sided consolidation  RESOLVED PROBLEM LIST   ASSESSMENT AND PLAN    Acute resp failure  -recurrent, etiology unclear - ?for neuromuscular disease ,  ? recurrent aspiration.  We are treating as potential upper neuron disorder.  Repeated mucous plugging.  Nif on 10/23 -18 -per neuro IVIG x5 days (day 3/5) -ach receptor ab pending (can call [facility] Monday) -voltage gate calcium channel labs pending (under misc labs on 10/23) - No reserve 2/2 deconditioning - Sat goals of >  94% - Chest PT as able - PT/OT - Swallow eval once extubated - down 9lbs this admission  Aspiration gram-negative pneumonia- Now with improved left lung and increased consolidation lower right on CXR 10/23 WBC 6.8 Afebrile overnight 10/23-24 Trend CBC/ fever curve Monitor off abx for now Trend CXR  GI Protein calorie malnutrition/weight loss -whole-body imaging negative, no clear etiology apparent, no evidence of active TB No BM since 10/18 Plan - ct tube feeds -Refeeding syndrome-replete phos/mag prn - Trend  mag/ phos - prn biscodyl  Renal Adequate output Plan Replete electrolytes prn Trend BMET  Endocrine DM Plan CBG's Q 4 SSI  Detemir 17 U held 10/24 due to NPO 2/2 scheduled trach, <10u novolog needed since so will not restart  SUMMARY OF TODAY'S PLAN:   S/p trach day 1 Awaiting acetylcholine testing results>> will call lab monday Replete phos  and K as needed Cont Holding long acting insulin in light of low novolog use lovenox restarted s/p trach  Best Practice / Goals of Care / Disposition.    DVT PROPHYLAXIS: Lovenox being held s/p trach SUP: Pepcid Updated wife at bedside daily   LABS  Glucose Recent Labs  Lab 08/11/18 0726 08/11/18 1124 08/11/18 1513 08/11/18 1943 08/12/18 0007 08/12/18 0417  GLUCAP 117* 158* 128* 98 142* 121*    BMET Recent Labs  Lab 08/10/18 0528 08/11/18 0427 08/12/18 0441  NA 138 137 138  K 3.7 3.7 3.6  CL 100 103 102  CO2 30 28 26   BUN 17 18 15   CREATININE 0.67 0.51* 0.63  GLUCOSE 235* 193* 123*    Liver Enzymes Recent Labs  Lab 08/09/18 0457 08/10/18 0528  AST 13* 27  ALT 17 24  ALKPHOS 34* 49  BILITOT 0.5 0.8  ALBUMIN 2.3* 2.5*    Electrolytes Recent Labs  Lab 08/06/18 0538 08/07/18 0500 08/08/18 0506  08/10/18 0528 08/11/18 0427 08/12/18 0441  CALCIUM 8.7* 8.8* 8.2*   < > 8.6* 8.5* 8.7*  MG 2.0 1.9 2.1  --  1.8  --   --   PHOS 2.5 2.5 2.5  --   --   --   --    < > = values in this interval not displayed.    CBC Recent Labs  Lab 08/10/18 0528 08/11/18 0427 08/12/18 0441  WBC 11.0* 6.4 6.8  HGB 12.2* 10.8* 10.7*  HCT 38.3* 33.9* 34.5*  PLT 344 319 351    ABG Recent Labs  Lab 08/10/18 0540  PHART 7.308*  PCO2ART 64.6*  PO2ART 279.0*    Coag's Recent Labs  Lab 08/10/18 1626 08/11/18 0427  APTT 31 33  INR 1.36 1.31    Sepsis Markers No results for input(s): LATICACIDVEN, PROCALCITON, O2SATVEN in the last 168 hours.  Cardiac Enzymes No results for input(s): TROPONINI, PROBNP  in the last 168 hours.  08/12/18, DO  PGY2 [facility] 08/12/2018 6:52 AM

## 2018-08-12 NOTE — Progress Notes (Signed)
Occupational Therapy Treatment Patient Details Name: Wayne Young MRN: [id number] DOB: 28-Dec-1955 Today's Date: 08/12/2018    History of present illness 62 y.o. male admitted with dyspnea and respiratory failure requiring mechanical ventilation. Extubated 10/11, re-intubated 10/13, self extubated and reintubated 10/23, trach 10/24, Cortrak 10/25. Has experienced gradual weakness with falls since this past June per family, neurology work up for MG/neuromuscular etiology pending at time of PT evaluation. PMH includes but not limted to: COPD, RA, unexplained weight loss.    OT comments  Pt agreeable to sitting EOB. Performed oral care and changed gown with min assist while seated. Tolerated well. Declined OOB to chair. Educated on benefits of activity. Updated d/c disposition to CIR. Pt is weaning from vent.  Follow Up Recommendations  CIR    Equipment Recommendations  3 in 1 bedside commode    Recommendations for Other Services      Precautions / Restrictions Precautions Precautions: Fall Precaution Comments: on vent via trach       Mobility Bed Mobility Overal bed mobility: Needs Assistance Bed Mobility: Supine to Sit;Sit to Supine     Supine to sit: Min assist Sit to supine: Min guard   General bed mobility comments: required some encouragement to sit EOB with OT, educated in benefits, assisted to raise trunk  Transfers                 General transfer comment: pt declined OOB    Balance Overall balance assessment: Needs assistance   Sitting balance-Leahy Scale: Fair                                     ADL either performed or assessed with clinical judgement   ADL Overall ADL's : Needs assistance/impaired     Grooming: Oral care;Sitting;Minimal assistance Grooming Details (indicate cue type and reason): difficulty due to Cortrak         Upper Body Dressing : Minimal assistance;Sitting Upper Body Dressing Details (indicate cue type  and reason): gown change                         Vision       Perception     Praxis      Cognition Arousal/Alertness: Awake/alert Behavior During Therapy: Flat affect Overall Cognitive Status: Difficult to assess                                          Exercises     Shoulder Instructions       General Comments      Pertinent Vitals/ Pain       Pain Assessment: Faces Faces Pain Scale: No hurt  Home Living                                          Prior Functioning/Environment              Frequency  Min 2X/week        Progress Toward Goals  OT Goals(current goals can now be found in the care plan section)  Progress towards OT goals: Progressing toward goals  Acute Rehab OT Goals Patient Stated Goal: "Return to normal" OT Goal Formulation:  With patient/family Time For Goal Achievement: 08/22/18 Potential to Achieve Goals: Good ADL Goals Pt Will Perform Grooming: with supervision;standing Pt Will Perform Lower Body Bathing: with supervision;sit to/from stand Pt Will Perform Lower Body Dressing: with supervision;sit to/from stand Pt Will Transfer to Toilet: with supervision;ambulating;bedside commode(over toilet) Pt Will Perform Toileting - Clothing Manipulation and hygiene: with supervision;sit to/from stand Additional ADL Goal #1: Pt will utilize energy conservation strategies in ADL and mobility with minimal verbal cues.  Plan Discharge plan needs to be updated    Co-evaluation                 AM-PAC PT "6 Clicks" Daily Activity     Outcome Measure   Help from another person eating meals?: Total Help from another person taking care of personal grooming?: A Little Help from another person toileting, which includes using toliet, bedpan, or urinal?: A Lot Help from another person bathing (including washing, rinsing, drying)?: A Lot Help from another person to put on and taking off regular upper  body clothing?: A Little Help from another person to put on and taking off regular lower body clothing?: A Lot 6 Click Score: 13    End of Session    OT Visit Diagnosis: Muscle weakness (generalized) (M62.81)   Activity Tolerance Patient tolerated treatment well   Patient Left in bed;with call bell/phone within reach;with family/visitor present(MD present)   Nurse Communication          Time: 2831-5176 OT Time Calculation (min): 25 min  Charges: OT General Charges $OT Visit: 1 Visit OT Treatments $Self Care/Home Management : 8-22 mins $Therapeutic Activity: 8-22 mins  Wayne Young, OTR/L Acute Rehabilitation Services Pager: [phone] Office: [phone]   Wayne Young 08/12/2018, 3:19 PM

## 2018-08-12 NOTE — Progress Notes (Addendum)
Subjective: Patient received a trach yesterday. Still on the vent. Respiratory is trying to wean 02 down. Patient awake and alert. Able to follow commands. Will answer yes/no by shaking head. Communicate through writing. Patient had an episode of desaturation overnight and had to be bagged. Propofol stopped at 0725 this morning. No family at bedside.  Exam: Vitals:   08/12/18 0732 08/12/18 0800  BP:  120/72  Pulse:  87  Resp:    Temp: 98.8 F (37.1 C)   SpO2:  100%    Physical Exam   HEENT-  Normocephalic, no lesions, without obvious abnormality.  Normal external eye and conjunctiva.   Extremities- Warm, dry and intact Musculoskeletal-no joint tenderness, joint deformities in the knees Skin-warm and dry, no hyperpigmentation, vitiligo, or suspicious lesions    Mental Status: Awake, alert, oriented. Patient trached. Communicated via writing or by shaking head yes and no. Able to follow commands without difficulty. Cranial Nerves: II:  Visual fields grossly normal,  III,IV, VI: ptosis not present, extra-ocular motions intact bilaterally pupils equal, round, reactive to light and accommodation V,VII: smile symmetric, facial light touch sensation normal bilaterally VIII: hearing normal bilaterally IX,X: uvula rises symmetrically XI: bilateral shoulder shrug XII: midline tongue extension Motor: Right : Upper extremity   5/5  Left:     Upper extremity   5/5  Lower extremity   5/5   Lower extremity   5/5 Tone and bulk:normal tone throughout; no atrophy noted Sensory:  light touch intact throughout, bilaterally Deep Tendon Reflexes: 2+ and symmetric biceps, patella Plantars: Right: downgoing   Left: downgoing Cerebellar: normal finger-to-nose  Gait: deferred    Medications:  Scheduled: . amLODipine  10 mg Per Tube Daily  . brimonidine  2 drop Both Eyes TID  . chlorhexidine gluconate (MEDLINE KIT)  15 mL Mouth Rinse BID  . Chlorhexidine Gluconate Cloth  6 each Topical  Daily  . docusate  100 mg Per Tube BID  . dorzolamide-timolol  2 drop Both Eyes BID  . enoxaparin (LOVENOX) injection  40 mg Subcutaneous Q24H  . famotidine  20 mg Per Tube BID  . guaiFENesin  10 mL Per Tube Q4H  . insulin aspart  0-9 Units Subcutaneous Q4H  . ipratropium-albuterol  3 mL Nebulization TID  . mouth rinse  15 mL Mouth Rinse 10 times per day  . midazolam  2 mg Intravenous Once  . sodium chloride flush  10-40 mL Intracatheter Q12H  . sodium chloride HYPERTONIC  4 mL Nebulization TID    Pertinent Labs/Diagnostics: -Awaiting acetylcholine receptor labs - awaiting results of musk antibodies along with calcium gated channel antibodies  Dg Chest Port 1 View  Result Date: 08/11/2018 CLINICAL DATA:  Placement of tracheostomy, respiratory failure EXAM: PORTABLE CHEST 1 VIEW COMPARISON:  Portable chest x-ray of 08/11/2018 FINDINGS: The endotracheal tube has been removed and tracheostomy is present which appears to be in good position on this portable semi-erect film. The NG tube also has been removed. Right PICC line tip is seen to the lower SVC and persistent abnormality at the right lung base is noted. Atelectasis in volume loss at the right lung base is a definite consideration. IMPRESSION: 1. Little change in opacity at the right lung base suspicious for atelectasis and pneumonia. Possible small effusions. 2. Tracheostomy appears to be in good position with removal of endotracheal tube. 3. The left lung remains clear. Electronically Signed   By: Ivar Drape M.D.   On: 08/11/2018 13:16   Dg Chest Port 1  View  Result Date: 08/11/2018 CLINICAL DATA:  Ventilator dependent EXAM: PORTABLE CHEST 1 VIEW COMPARISON:  08/10/2018 FINDINGS: Cardiac shadow is stable. Endotracheal tube, a right-sided PICC line and nasogastric catheter are again seen and stable. Persistent right basilar infiltrate with associated effusion is seen. No bony abnormality is noted. IMPRESSION: Stable right lower lobe  infiltrate with associated effusion. Electronically Signed   By: Inez Catalina M.D.   On: 08/11/2018 11:01   Laurey Morale, MSN, NP-C [facility] [phone]   Attending Neurologist note to follow:  ASSESSMENT AND PLAN  62 year old male with progressive fatigue, shortness of breath, hypophonia, significant unintentional weight loss per wife presents with acute hypercarbic respiratory failure requiring intubation.  Attempts at weaning off ventilator have been unsuccessful.  Critical care team concerned about neuromuscular weakness as cause for persistent hypercarbic respiratory failure.  Patient does not have any significant show any weakness, reflexes appear to be intact making this less likely to be due to deconditioning, CIDP of critical illness neuropathy.   Was started on Empiric IVIG on 10/23. Underwent tracheostomy.   Also on reviewing patient's chart it appears he was started on Keppra empirically for possible seizure on Oct 10. EEG performed was subsequently normal. Will taper Keppra to 547m BID.   Impression Possible Neuromuscular cause for Hypercarbic Respiratory failure, Dysphagia and Hypophonia D/D include LRita Oharasyndrome vs seronegative MG ( all 3 ACHR ab negative)   Plan -Empiric IVIG x 5 days (3rd dose today) - F/U voltage gated calcium channel ab and anti MUSK ab results -will need single fiber EMG/RNS testing as outpatient 1-2 weeks on discharge. - Reduce Keppra to 5024mBID, can likely be discontinued as outpatient.    Thanks for the consult. Please call back if questions or if patient continues to require ventilatory assistance after 1 week ( 5 days after completion of IVIG).     NEUROHOSPITALIST ADDENDUM Performed a face to face diagnostic evaluation.   I have reviewed the contents of history and physical exam as documented by PA/ARNP/Resident and agree with above documentation.  I have discussed and formulated the above plan as  documented. Edits to the note have been made as needed.  Consulted for persistent hypercarbic respiratory failure. Has good strength in all 4 extremities, no ptosis today.  S/P tracheostomy. Will need to complete his IVIG and follow up with neurology as outpatient for further testing. F/U voltage gated calcium channel ab and anti MUSK ab results.   Please call back if patient continues to require ventilatory assistance after 1 week ( 5 days after completion of IVIG).    SuKarena Addisonroor MD [facility] [phone] If 7pm to 7am, please call on call as listed on AMION.

## 2018-08-12 NOTE — Progress Notes (Signed)
eLink Physician-Brief Progress Note Patient Name: Wayne Young DOB: 11-15-1955 MRN: [id number]   Date of Service  08/12/2018  HPI/Events of Note  Recent trach  eICU Interventions  fent prn pain     Intervention Category Intermediate Interventions: Pain - evaluation and management  Wayne Young 08/12/2018, 9:32 PM

## 2018-08-12 NOTE — Progress Notes (Signed)
Nutrition Follow-up  DOCUMENTATION CODES:   Severe malnutrition in context of acute illness/injury, Underweight  INTERVENTION:   Resume TF via Cortrak tube:  Vital AF 1.2 at 65 ml/h (1560 ml per day)  Provides 1872 kcal, 117 gm protein, 1265 ml free water daily  NUTRITION DIAGNOSIS:   Severe Malnutrition related to (acute vs chronic illness (work-up underway)) as evidenced by moderate fat depletion, severe muscle depletion, moderate muscle depletion, percent weight loss(27% weight loss within 3 months).  Ongoing  GOAL:   Patient will meet greater than or equal to 90% of their needs  Will be met with TF  MONITOR:   Vent status, TF tolerance, Labs, I & O's  ASSESSMENT:   62 yo male with PMH of DM, rheumatoid arthritis, HTN, positive PPD (completed treatment), and glaucoma who was admitted on 10/7 with SOB and involuntary weight loss. Intubated 10/7, extubated 10/9, but required re-intubation later that day.   S/P tracheostomy 10/24. TF off since tracheostomy placement. S/P Cortrak placement today; tip in the stomach.    Patient is currently intubated on ventilator support MV: 10.5 L/min Temp (24hrs), Avg:99.2 F (37.3 C), Min:98.8 F (37.1 C), Max:99.8 F (37.7 C)  Propofol: off  Labs reviewed. CBG's: 142-121 Medications reviewed and include Colace, Novolog.  Weight down 3.7 kg since admission I/O +15.5 L since admission  Diet Order:   Diet Order            Diet NPO time specified  Diet effective midnight              EDUCATION NEEDS:   No education needs have been identified at this time  Skin:  Skin Assessment: Skin Integrity Issues: Skin Integrity Issues:: Stage II, Other (Comment) Stage II: sacrum Other: MASD to buttocks, groin  Last BM:  10/23 (type 6)  Height:   Ht Readings from Last 1 Encounters:  07/31/18 5' 11"  (1.803 m)    Weight:   Wt Readings from Last 1 Encounters:  08/12/18 58.5 kg    Ideal Body Weight:  78.2  kg  BMI:  Body mass index is 17.99 kg/m.  Estimated Nutritional Needs:   Kcal:  4628  Protein:  90-100 gm  Fluid:  1.8 - 2 L    Molli Barrows, RD, LDN, [place] Pager [phone] After Hours Pager [phone]

## 2018-08-12 NOTE — Progress Notes (Signed)
Pt having bowel movement and states he "needs a rest" earlier.  Pt was layed flat for bedpan and desaturated.  Pt placed back on full vent support.

## 2018-08-12 NOTE — Care Management Note (Signed)
Case Management Note  Patient Details  Name: Wayne Young MRN: [id number] Date of Birth: [birth year]  Subjective/Objective:    Pt admitted with SOB, involuntary weight loss   - and declining mental status               Action/Plan:   PTA from home with wife. Pt intubated on arrival and extubated 10/11.  Pt will need PT and nutritional consult when appropriate    Expected Discharge Date:                  Expected Discharge Plan:     In-House Referral:  Clinical Social Work  Discharge planning Services  CM Consult  Post Acute Care Choice:    Choice offered to:     DME Arranged:    DME Agency:     HH Arranged:    HH Agency:     Status of Service:  In process, will continue to follow  If discussed at Long Length of Stay Meetings, dates discussed:    Additional Comments: 08/12/2018 Pt trached.  CSW to discuss out of state placement possibility with family due to lack of LTACH benefits Cherylann Parr, RN 08/12/2018, 9:33 AM

## 2018-08-12 NOTE — Procedures (Signed)
Cortrak  Person Inserting Tube:  Osa Craver, RD Tube Type:  Cortrak - 43 inches Tube Location:  Right nare Initial Placement:  Stomach Secured by: Bridle Technique Used to Measure Tube Placement:  Documented cm marking at nare/ corner of mouth Cortrak Secured At:  69 cm Procedure Comments:  Cortrak Tube Team Note:  Consult received to place a Cortrak feeding tube.   No x-ray is required. RN may begin using tube.   If the tube becomes dislodged please keep the tube and contact the Cortrak team at [email or web address] ([email or web address]) for replacement.  If after hours and replacement cannot be delayed, place a NG tube and confirm placement with an abdominal x-ray.     Romelle Starcher MS, RD, LDN, CNSC [phone] Pager  [phone] Weekend/On-Call Pager

## 2018-08-13 ENCOUNTER — Inpatient Hospital Stay (HOSPITAL_COMMUNITY): Payer: Medicaid Other

## 2018-08-13 DIAGNOSIS — R0603 Acute respiratory distress: Secondary | ICD-10-CM

## 2018-08-13 DIAGNOSIS — Z0189 Encounter for other specified special examinations: Secondary | ICD-10-CM

## 2018-08-13 LAB — CBC
HCT: 37.1 % — ABNORMAL LOW (ref 39.0–52.0)
Hemoglobin: 11.9 g/dL — ABNORMAL LOW (ref 13.0–17.0)
MCH: 28.3 pg (ref 26.0–34.0)
MCHC: 32.1 g/dL (ref 30.0–36.0)
MCV: 88.3 fL (ref 80.0–100.0)
PLATELETS: 335 10*3/uL (ref 150–400)
RBC: 4.2 MIL/uL — AB (ref 4.22–5.81)
RDW: 12.4 % (ref 11.5–15.5)
WBC: 6.2 10*3/uL (ref 4.0–10.5)
nRBC: 0 % (ref 0.0–0.2)

## 2018-08-13 LAB — BASIC METABOLIC PANEL
Anion gap: 6 (ref 5–15)
BUN: 15 mg/dL (ref 8–23)
CALCIUM: 8.7 mg/dL — AB (ref 8.9–10.3)
CO2: 27 mmol/L (ref 22–32)
CREATININE: 0.56 mg/dL — AB (ref 0.61–1.24)
Chloride: 103 mmol/L (ref 98–111)
GFR calc Af Amer: >60 mL/min (ref >60)
GLUCOSE: 231 mg/dL — AB (ref 70–99)
POTASSIUM: 3.1 mmol/L — AB (ref 3.5–5.1)
SODIUM: 136 mmol/L (ref 135–145)

## 2018-08-13 LAB — GLUCOSE, CAPILLARY
GLUCOSE-CAPILLARY: 204 mg/dL — AB (ref 70–99)
GLUCOSE-CAPILLARY: 239 mg/dL — AB (ref 70–99)
GLUCOSE-CAPILLARY: 253 mg/dL — AB (ref 70–99)
Glucose-Capillary: 239 mg/dL — ABNORMAL HIGH (ref 70–99)
Glucose-Capillary: 170 mg/dL — ABNORMAL HIGH (ref 70–99)
Glucose-Capillary: 200 mg/dL — ABNORMAL HIGH (ref 70–99)

## 2018-08-13 MED ORDER — INSULIN GLARGINE 100 UNIT/ML ~~LOC~~ SOLN
3.0000 [IU] | Freq: Every day | SUBCUTANEOUS | Status: DC
  Administered 2018-08-13: 3 [IU] via SUBCUTANEOUS
  Filled 2018-08-13 (×2): qty 0.03

## 2018-08-13 MED ORDER — ACETYLCYSTEINE 20 % IN SOLN
4.0000 mL | Freq: Two times a day (BID) | RESPIRATORY_TRACT | Status: AC
  Administered 2018-08-13 – 2018-08-15 (×4): 4 mL via RESPIRATORY_TRACT
  Filled 2018-08-13 (×4): qty 4

## 2018-08-13 MED ORDER — POTASSIUM CHLORIDE 20 MEQ/15ML (10%) PO SOLN
30.0000 meq | ORAL | Status: AC
  Administered 2018-08-13 (×2): 30 meq
  Filled 2018-08-13 (×2): qty 30

## 2018-08-13 MED ORDER — LEVALBUTEROL HCL 1.25 MG/0.5ML IN NEBU
1.2500 mg | INHALATION_SOLUTION | Freq: Three times a day (TID) | RESPIRATORY_TRACT | Status: DC
  Administered 2018-08-13 – 2018-08-15 (×6): 1.25 mg via RESPIRATORY_TRACT
  Filled 2018-08-13 (×7): qty 0.5

## 2018-08-13 NOTE — Progress Notes (Signed)
Physical Therapy Treatment Patient Details Name: Wayne Young MRN: [id number] DOB: May 08, 1956 Today's Date: 08/13/2018    History of Present Illness 62 y.o. male admitted with dyspnea and respiratory failure requiring mechanical ventilation. Extubated 10/11, re-intubated 10/13, self extubated and reintubated 10/23, trach 10/24, Cortrak 10/25. Has experienced gradual weakness with falls since this past June per family, neurology work up for MG/neuromuscular etiology pending at time of PT evaluation. PMH includes but not limted to: COPD, RA, unexplained weight loss.     PT Comments    Pt admitted with above diagnosis. Pt currently with functional limitations due to balance and endurance deficits. Pt was able to stand and pivot to chair with min assist of 2.  Pt on vent with trach in place.  VSS.  Pt needed max encouragement to get OOB to chair.  Once up, pt seemed happy to be up.  Pt will benefit from skilled PT to increase their independence and safety with mobility to allow discharge to the venue listed below.     Follow Up Recommendations  CIR     Equipment Recommendations  None recommended by PT    Recommendations for Other Services OT consult     Precautions / Restrictions Precautions Precautions: Fall Precaution Comments: on vent via trach Restrictions Weight Bearing Restrictions: No    Mobility  Bed Mobility Overal bed mobility: Needs Assistance Bed Mobility: Supine to Sit;Sit to Supine     Supine to sit: Min assist     General bed mobility comments: required some encouragement to sit EOB with PT, educated in benefits, assisted to raise trunk  Transfers Overall transfer level: Needs assistance Equipment used: 2 person hand held assist Transfers: Sit to/from Stand Sit to Stand: Min assist;+2 physical assistance;From elevated surface         General transfer comment: Pt was able to stand with min +2 assist and take pivotal steps to chair.  Pt had to be cleaned  a little due to oozing bowels.   Ambulation/Gait                 Stairs             Wheelchair Mobility    Modified Rankin (Stroke Patients Only)       Balance Overall balance assessment: Needs assistance Sitting-balance support: No upper extremity supported;Feet supported Sitting balance-Leahy Scale: Fair     Standing balance support: Bilateral upper extremity supported;During functional activity Standing balance-Leahy Scale: Poor Standing balance comment: Reliant on UE and physical support                            Cognition Arousal/Alertness: Awake/alert Behavior During Therapy: Flat affect Overall Cognitive Status: Difficult to assess                                 General Comments: Pt is very anxious, worried about his breathing and his potential "nerve" diagnosis.  He needs lots of gentle persuasion and encouragment.       Exercises General Exercises - Lower Extremity Ankle Circles/Pumps: 20 reps Quad Sets: 20 reps    General Comments General comments (skin integrity, edema, etc.): PEEP 5, FiO2 40%;  VSS with treatment.       Pertinent Vitals/Pain Pain Assessment: No/denies pain    Home Living  Prior Function            PT Goals (current goals can now be found in the care plan section) Acute Rehab PT Goals Patient Stated Goal: "Return to normal" Progress towards PT goals: Progressing toward goals    Frequency    Min 3X/week      PT Plan Current plan remains appropriate    Co-evaluation              AM-PAC PT "6 Clicks" Daily Activity  Outcome Measure  Difficulty turning over in bed (including adjusting bedclothes, sheets and blankets)?: Unable Difficulty moving from lying on back to sitting on the side of the bed? : Unable Difficulty sitting down on and standing up from a chair with arms (e.g., wheelchair, bedside commode, etc,.)?: Unable Help needed moving to  and from a bed to chair (including a wheelchair)?: A Little Help needed walking in hospital room?: A Lot Help needed climbing 3-5 steps with a railing? : A Lot 6 Click Score: 10    End of Session Equipment Utilized During Treatment: Oxygen;Gait belt(vent with trach) Activity Tolerance: Other (comment)(Pt is self limiting, anxious.) Patient left: with call bell/phone within reach;with family/visitor present;in chair Nurse Communication: Mobility status PT Visit Diagnosis: Unsteadiness on feet (R26.81);Muscle weakness (generalized) (M62.81);History of falling (Z91.81)     Time: 8341-9622 PT Time Calculation (min) (ACUTE ONLY): 32 min  Charges:  $Therapeutic Exercise: 8-22 mins $Therapeutic Activity: 8-22 mins                     Liahm Grivas,PT Acute Rehabilitation Services Pager:  [phone]  Office:  [phone]     Berline Lopes 08/13/2018, 1:20 PM

## 2018-08-13 NOTE — Progress Notes (Signed)
Patient taken off of ventilator and bag lavaged patient per MD order.  Patient tolerated well and RT was able to obtain a moderate amount of thick yellow secretions.

## 2018-08-13 NOTE — Progress Notes (Signed)
[facility] ADULT ICU REPLACEMENT PROTOCOL FOR AM LAB REPLACEMENT ONLY  The patient does apply for the [facility] Adult ICU Electrolyte Replacment Protocol based on the criteria listed below:   1. Is GFR >/= 40 ml/min? Yes.    Patient's GFR today is >40 2. Is urine output >/= 0.5 ml/kg/hr for the last 6 hours? Yes.   Patient's UOP is 0.92 ml/kg/hr 3. Is BUN < 60 mg/dL? Yes.    Patient's BUN today is 15 4. Abnormal electrolyte(s): K=3.1 5. Ordered repletion with: elink electrolytes protocol 6. If a panic level lab has been reported, has the CCM MD in charge been notified? Yes.  .   Physician:  Dr. Deidre Ala, Kalif Kattner Seromines 08/13/2018 6:01 AM

## 2018-08-13 NOTE — Progress Notes (Signed)
NIF and VC performed with patient.  NIF of -13, VC of 0.8L obtained however patient had just gotten back into bed from the chair and slow to fully catch his breath.  Tolerated well.

## 2018-08-13 NOTE — Progress Notes (Signed)
PULMONARY / CRITICAL CARE MEDICINE   NAME:  Wayne Young, MRN:  [id number], DOB:  1956/05/16, LOS: 19 ADMISSION DATE:  07/25/2018,   CHIEF COMPLAINT: Dyspnea and declining mental status.  BRIEF HISTORY:        62 year old man, history of RA and prior methotrexate, latent TB (treated), months of unexplained weight loss.  He quit smoking 21 years ago.  Admitted with dyspnea and then respiratory failure requiring mechanical ventilation.  Treating for left lower lobe pneumonia.  He failed extubation on 10/9, experienced an episode suspicious for acute seizure and was reintubated.  Empirically on Keppra.  Brain imaging and EEG were both reassuring. Extubated 10/11 He was reintubated 10/13 for sudden respiratory distress and inability to clear secretions, concern for intermittent diplopia and weakness Self extubated and re-intubated on 10/23 Starting empiric IVIG treatment x5 on 10/24 for suspicion of MG/LE  SIGNIFICANT PAST MEDICAL HISTORY    Rheumatoid arthritis, positive PPD.  SIGNIFICANT EVENTS:  10/17 suction bronch, normal anatomy with thick clear mucous on left partially cleared with saline lavage 10/23 self-extubated and re-intubated  STUDIES:   CT chest angio 10/9 >> LLL consolidation CT abd  / pelvis  10/7 >> ? SMA syndrome CT neck 10/7 >> neg MRI brain 10/10 >> chronic ischemic microangiopathy  EEG 10/10 neg  MRI T/ C spine 10/13 >>  mild central disc protrusion C4-6 but no cord compression and no demyelinating lesions  10/13 myasthenia gravis labs (2-4day turnaround)> Achr binding antibody: 0.03- normal range.  Achr blocking antibody 22 - normal range Achr modulating Ab from 10/14 now neg  CULTURES:  resp 10/14 >>> enterobacter >> CTX sens  ANTIBIOTICS:  Zosyn 10/7 >> 10/17 CTX  10/17 >>>> 10/21  LINES/TUBES:  ETT 10/7 >> 10/9, 10/9 >> 10/11 ,          10/13 >>10/23          10/23>> 10/24 Trach 10/24>> CONSULTANTS:  Neurology >>  SUBJECTIVE:  Patient alert and  communicating via writing during early rounds, says he thinks he just got too tired yesterday off of PRVC.  Was back on PS ~67min on early rounds 10/26 am and doing well T Max 101.0 overnight 10/25 WBC 6.2  Per RT, desatted when laid flat for bedpan.  Placed back on full vent support overnight 10/25.  Now back on PS/CPAP on early rounds  CONSTITUTIONAL: BP 138/82 (BP Location: Left Arm)   Pulse (!) 110   Temp 99.3 F (37.4 C) (Oral)   Resp 16   Ht 5\' 11"  (1.803 m)   Wt 59.5 kg   SpO2 100%   BMI 18.30 kg/m   I/O last 3 completed shifts: In: 2926 [I.V.:1391; NG/GT:1235; IV Piggyback:300] Out: 1460 [Urine:1460]     Vent Mode: CPAP;PSV FiO2 (%):  [40 %] 40 % Set Rate:  [16 bmp] 16 bmp Vt Set:  [600 mL] 600 mL PEEP:  [5 cmH20-8 cmH20] 5 cmH20 Pressure Support:  [8 cmH20-16 cmH20] 16 cmH20 Plateau Pressure:  [18 cmH20-24 cmH20] 20 cmH20  PHYSICAL EXAM:  Frail, alert black male.  Communicating in writing and with hand signals due to trach S1/2, rate wnl Right lower lobe with decreased air flow again Soft abdomen,  ND  CXR 10/26 with stable right sided consolidation  RESOLVED PROBLEM LIST   ASSESSMENT AND PLAN    Acute resp failure  -recurrent, etiology unclear - ?for neuromuscular disease ,  ? recurrent aspiration.  We are treating as potential upper neuron disorder.  Repeated mucous  plugging.  Nif on 10/23 -18 -per neuro IVIG x5 days (day 4/5) -voltage gate calcium channel labs pending (under misc labs on 10/23) - No reserve 2/2 deconditioning - Sat goals of > 94% - Chest PT as able - PT/OT - Swallow eval once reliably off vent - down 6lbs this admission -Daily nifs  Aspiration gram-negative pneumonia- Now with improved left lung and increased consolidation lower right on CXR 10/23 WBC 6.8 Afebrile overnight 10/23-24 Trend CBC/ fever curve Monitor off abx for now Trend CXR  GI Protein calorie malnutrition/weight loss -whole-body imaging negative, no clear  etiology apparent, no evidence of active TB Plan - ct tube feeds -Refeeding syndrome-replete phos/mag prn (repleting K, 3.1 10/26) - Trend mag/ phos - prn biscodyl  Renal Adequate output, up 1.5 L this admission Plan Replete electrolytes prn Trend BMET  Endocrine DM Plan CBG's Q 4 SSI  restart lantus at 3u  Eventual Outpatient plan: Neuro: -will need single fiber EMG/RNS testing as outpatient 1-2 weeks on discharge. -Keppra can potentially be discontinued as outpatient.    SUMMARY OF TODAY'S PLAN:   S/p trach day 2 Ach antibody labs from 10/14, if MG is seronegative Replete phos  and K as needed Restart lantus at 3u Patient back on full vent support, may try to wean again today  Best Practice / Goals of Care / Disposition.    DVT PROPHYLAXIS: Lovenox SUP: Pepcid Updated wife at bedside daily   LABS  Glucose Recent Labs  Lab 08/12/18 1147 08/12/18 1551 08/12/18 2025 08/13/18 0023 08/13/18 0335 08/13/18 0741  GLUCAP 116* 175* 278* 204* 239* 170*    BMET Recent Labs  Lab 08/11/18 0427 08/12/18 0441 08/13/18 0407  NA 137 138 136  K 3.7 3.6 3.1*  CL 103 102 103  CO2 28 26 27   BUN 18 15 15   CREATININE 0.51* 0.63 0.56*  GLUCOSE 193* 123* 231*    Liver Enzymes Recent Labs  Lab 08/09/18 0457 08/10/18 0528  AST 13* 27  ALT 17 24  ALKPHOS 34* 49  BILITOT 0.5 0.8  ALBUMIN 2.3* 2.5*    Electrolytes Recent Labs  Lab 08/07/18 0500 08/08/18 0506  08/10/18 0528 08/11/18 0427 08/12/18 0441 08/13/18 0407  CALCIUM 8.8* 8.2*   < > 8.6* 8.5* 8.7* 8.7*  MG 1.9 2.1  --  1.8  --  2.0  --   PHOS 2.5 2.5  --   --   --  3.0  --    < > = values in this interval not displayed.    CBC Recent Labs  Lab 08/11/18 0427 08/12/18 0441 08/13/18 0407  WBC 6.4 6.8 6.2  HGB 10.8* 10.7* 11.9*  HCT 33.9* 34.5* 37.1*  PLT 319 351 335    ABG Recent Labs  Lab 08/10/18 0540  PHART 7.308*  PCO2ART 64.6*  PO2ART 279.0*    Coag's Recent Labs  Lab  08/10/18 1626 08/11/18 0427  APTT 31 33  INR 1.36 1.31    Sepsis Markers No results for input(s): LATICACIDVEN, PROCALCITON, O2SATVEN in the last 168 hours.  Cardiac Enzymes No results for input(s): TROPONINI, PROBNP in the last 168 hours.  08/12/18, DO  PGY2  08/13/2018 9:10 AM

## 2018-08-14 ENCOUNTER — Inpatient Hospital Stay (HOSPITAL_COMMUNITY): Payer: Medicaid Other

## 2018-08-14 DIAGNOSIS — J181 Lobar pneumonia, unspecified organism: Secondary | ICD-10-CM

## 2018-08-14 LAB — CBC
HCT: 33.2 % — ABNORMAL LOW (ref 39.0–52.0)
HEMOGLOBIN: 10.6 g/dL — AB (ref 13.0–17.0)
MCH: 28.3 pg (ref 26.0–34.0)
MCHC: 31.9 g/dL (ref 30.0–36.0)
MCV: 88.5 fL (ref 80.0–100.0)
PLATELETS: 285 10*3/uL (ref 150–400)
RBC: 3.75 MIL/uL — AB (ref 4.22–5.81)
RDW: 12.5 % (ref 11.5–15.5)
WBC: 7 10*3/uL (ref 4.0–10.5)
nRBC: 0 % (ref 0.0–0.2)

## 2018-08-14 LAB — GLUCOSE, CAPILLARY
GLUCOSE-CAPILLARY: 234 mg/dL — AB (ref 70–99)
GLUCOSE-CAPILLARY: 260 mg/dL — AB (ref 70–99)
GLUCOSE-CAPILLARY: 295 mg/dL — AB (ref 70–99)
Glucose-Capillary: 238 mg/dL — ABNORMAL HIGH (ref 70–99)
Glucose-Capillary: 234 mg/dL — ABNORMAL HIGH (ref 70–99)
Glucose-Capillary: 205 mg/dL — ABNORMAL HIGH (ref 70–99)

## 2018-08-14 LAB — BASIC METABOLIC PANEL
ANION GAP: 11 (ref 5–15)
BUN: 13 mg/dL (ref 8–23)
CALCIUM: 8.9 mg/dL (ref 8.9–10.3)
CO2: 26 mmol/L (ref 22–32)
Chloride: 101 mmol/L (ref 98–111)
Creatinine, Ser: 0.47 mg/dL — ABNORMAL LOW (ref 0.61–1.24)
Glucose, Bld: 256 mg/dL — ABNORMAL HIGH (ref 70–99)
POTASSIUM: 3.2 mmol/L — AB (ref 3.5–5.1)
Sodium: 138 mmol/L (ref 135–145)

## 2018-08-14 LAB — MAGNESIUM: Magnesium: 1.9 mg/dL (ref 1.7–2.4)

## 2018-08-14 LAB — PHOSPHORUS: PHOSPHORUS: 2.3 mg/dL — AB (ref 2.5–4.6)

## 2018-08-14 MED ORDER — INSULIN GLARGINE 100 UNIT/ML ~~LOC~~ SOLN
6.0000 [IU] | Freq: Every day | SUBCUTANEOUS | Status: DC
  Administered 2018-08-14: 6 [IU] via SUBCUTANEOUS
  Filled 2018-08-14 (×2): qty 0.06

## 2018-08-14 MED ORDER — POTASSIUM PHOSPHATES 15 MMOLE/5ML IV SOLN
15.0000 mmol | Freq: Once | INTRAVENOUS | Status: AC
  Administered 2018-08-14: 15 mmol via INTRAVENOUS
  Filled 2018-08-14: qty 5

## 2018-08-14 MED ORDER — POTASSIUM CHLORIDE 20 MEQ/15ML (10%) PO SOLN
30.0000 meq | ORAL | Status: AC
  Administered 2018-08-14 (×2): 30 meq
  Filled 2018-08-14 (×2): qty 30

## 2018-08-14 MED ORDER — FENTANYL CITRATE (PF) 100 MCG/2ML IJ SOLN
INTRAMUSCULAR | Status: AC
  Administered 2018-08-14: 100 ug
  Filled 2018-08-14: qty 2

## 2018-08-14 MED ORDER — POTASSIUM CHLORIDE 20 MEQ/15ML (10%) PO SOLN: 40.0000 meq | ORAL | Status: DC

## 2018-08-14 MED ORDER — MIDAZOLAM HCL 2 MG/2ML IJ SOLN
INTRAMUSCULAR | Status: AC
  Administered 2018-08-14: 2 mg
  Filled 2018-08-14: qty 4

## 2018-08-14 NOTE — Progress Notes (Signed)
NIF of -15, VC of 1.0L performed with good patient effort.  Tolerated well.

## 2018-08-14 NOTE — Progress Notes (Signed)
RT note: patient meets criteria for daily SBT however plan is to bronch patient today.  Will attempt SBT on patient after procedure.

## 2018-08-14 NOTE — Progress Notes (Signed)
PULMONARY / CRITICAL CARE MEDICINE   NAME:  Wayne Young, MRN:  [id number], DOB:  1956-05-12, LOS: 20 ADMISSION DATE:  07/25/2018,   CHIEF COMPLAINT: Dyspnea and declining mental status.  BRIEF HISTORY:        62 year old man, history of RA and prior methotrexate, latent TB (treated), months of unexplained weight loss.  He quit smoking 21 years ago.  Admitted with dyspnea and then respiratory failure requiring mechanical ventilation.  Treating for left lower lobe pneumonia.  He failed extubation on 10/9, experienced an episode suspicious for acute seizure and was reintubated.  Empirically on Keppra.  Brain imaging and EEG were both reassuring. Extubated 10/11 He was reintubated 10/13 for sudden respiratory distress and inability to clear secretions, concern for intermittent diplopia and weakness Self extubated and re-intubated on 10/23 Starting empiric IVIG treatment x5 on 10/24 for suspicion of MG/LE  SIGNIFICANT PAST MEDICAL HISTORY    Rheumatoid arthritis, positive PPD.  SIGNIFICANT EVENTS:  10/17 suction bronch, normal anatomy with thick clear mucous on left partially cleared with saline lavage 10/23 self-extubated and re-intubated  STUDIES:   CT chest angio 10/9 >> LLL consolidation CT abd  / pelvis  10/7 >> ? SMA syndrome CT neck 10/7 >> neg MRI brain 10/10 >> chronic ischemic microangiopathy  EEG 10/10 neg  MRI T/ C spine 10/13 >>  mild central disc protrusion C4-6 but no cord compression and no demyelinating lesions  10/13 myasthenia gravis labs (2-4day turnaround)> Achr binding antibody: 0.03- normal range.  Achr blocking antibody 22 - normal range Achr modulating Ab from 10/14 now neg  10/23: voltage gated calcium channel lab neg musc antibody >> MG serurm >>  NIF: 10/24 -18 10/26 -13 10/27 -15  CULTURES:  resp 10/14 >>> enterobacter >> CTX sens  ANTIBIOTICS:  Zosyn 10/7 >> 10/17 CTX  10/17 >>>> 10/21  LINES/TUBES:  ETT 10/7 >> 10/9, 10/9 >> 10/11 ,       10/13 >>10/23          10/23>> 10/24 Trach 10/24>> CONSULTANTS:  Neurology >>  SUBJECTIVE:  Patient alert and communicating via writing during early rounds, concerned that possible bronch will interfere with church guests coming after 1pm RT to try SBT 10/27 T Max 100.9 overnight 10/26 WBC 7.0   CONSTITUTIONAL: BP 116/77   Pulse 92   Temp 99.2 F (37.3 C) (Oral)   Resp 17   Ht 5\' 11"  (1.803 m)   Wt 58.1 kg   SpO2 100%   BMI 17.86 kg/m   I/O last 3 completed shifts: In: 3177.5 [I.V.:537.5; NG/GT:2340; IV Piggyback:300] Out: 2000 [Urine:2000]     Vent Mode: PRVC FiO2 (%):  [40 %-50 %] 50 % Set Rate:  [16 bmp] 16 bmp Vt Set:  [600 mL] 600 mL PEEP:  [5 cmH20] 5 cmH20 Plateau Pressure:  [16 cmH20-20 cmH20] 16 cmH20  PHYSICAL EXAM:  Frail, alert black male.  Still Communicating in writing and with hand signals due to trach  S1/2, RRR Right lower lobe improved from 10/26 am, but still decreased movement Soft abdomen,  ND  CXR 10/27 with stable right sided consolidation from 10/26  RESOLVED PROBLEM LIST   ASSESSMENT AND PLAN    Acute resp failure  -recurrent, etiology unclear - ?for neuromuscular disease ,  ? recurrent aspiration.  We are treating as potential upper neuron disorder.  Repeated mucous plugging.  Nif on 10/23 -18 -per neuro IVIG x5 days (day 5/5) -voltage gate calcium channel labs neg - Musc antibody and  MG serum pending - No reserve 2/2 deconditioning - Sat goals of > 94% - Chest PT as able - PT/OT - Swallow eval once reliably off vent - down 9lbs this admission -Daily nifs (-15 10/27)  Aspiration gram-negative pneumonia- still with plugging vs consolodation on lower right on CXR 10/27 WBC 7 hitemp 100.9 overnight 10/26 Trend CBC/ fever curve Monitor off abx for now Trend CXR Potential suction bronch 10/27 for plugging -cont bag lavage  GI Protein calorie malnutrition/weight loss -whole-body imaging negative, no clear etiology  apparent, no evidence of active TB Plan - ct tube feeds -Refeeding syndrome-replete phos/mag prn (hypo phos/K on 10/27) - Trend mag/ phos - prn biscodyl  Renal Adequate output, up 1.5 L this admission Plan Replete electrolytes prn Trend BMET  Endocrine- needed 23u short acting since lantus 3 10/26, cbgs >200 DM Plan CBG's Q 4 SSI  increase lantus to 6u  Eventual Outpatient plan: Neuro: -will need single fiber EMG/RNS testing as outpatient 1-2 weeks on discharge. -Keppra can potentially be discontinued as outpatient.    SUMMARY OF TODAY'S PLAN:   S/p trach day 3 Voltage gated calcium channel labs neg, MG serum and musc antibody pending Replete phos  and K as needed Increase lantus to 6u Likely suction bronch and potential SBT after  Best Practice / Goals of Care / Disposition.    DVT PROPHYLAXIS: Lovenox SUP: Pepcid Updated wife at bedside daily   LABS  Glucose Recent Labs  Lab 08/13/18 1131 08/13/18 1546 08/13/18 1918 08/14/18 0030 08/14/18 0403 08/14/18 0738  GLUCAP 253* 239* 200* 295* 238* 234*    BMET Recent Labs  Lab 08/12/18 0441 08/13/18 0407 08/14/18 0443  NA 138 136 138  K 3.6 3.1* 3.2*  CL 102 103 101  CO2 26 27 26   BUN 15 15 13   CREATININE 0.63 0.56* 0.47*  GLUCOSE 123* 231* 256*    Liver Enzymes Recent Labs  Lab 08/09/18 0457 08/10/18 0528  AST 13* 27  ALT 17 24  ALKPHOS 34* 49  BILITOT 0.5 0.8  ALBUMIN 2.3* 2.5*    Electrolytes Recent Labs  Lab 08/08/18 0506  08/10/18 0528  08/12/18 0441 08/13/18 0407 08/14/18 0443  CALCIUM 8.2*   < > 8.6*   < > 8.7* 8.7* 8.9  MG 2.1  --  1.8  --  2.0  --  1.9  PHOS 2.5  --   --   --  3.0  --  2.3*   < > = values in this interval not displayed.    CBC Recent Labs  Lab 08/12/18 0441 08/13/18 0407 08/14/18 0443  WBC 6.8 6.2 7.0  HGB 10.7* 11.9* 10.6*  HCT 34.5* 37.1* 33.2*  PLT 351 335 285    ABG Recent Labs  Lab 08/10/18 0540  PHART 7.308*  PCO2ART 64.6*  PO2ART  279.0*    Coag's Recent Labs  Lab 08/10/18 1626 08/11/18 0427  APTT 31 33  INR 1.36 1.31    Sepsis Markers No results for input(s): LATICACIDVEN, PROCALCITON, O2SATVEN in the last 168 hours.  Cardiac Enzymes No results for input(s): TROPONINI, PROBNP in the last 168 hours.  08/12/18, DO  PGY2 [place] 08/14/2018 9:12 AM

## 2018-08-14 NOTE — Procedures (Signed)
PCCM Video Bronchoscopy Procedure Note  The patient was informed of the risks (including but not limited to bleeding, infection, respiratory failure, lung injury, tooth/oral injury) and benefits of the procedure and gave consent, see chart.  Indication: mucus plugging right lower lobe  Post Procedure Diagnosis: same, acute respiratory failure with hypoxemia  Location: Medical ICU  Condition pre procedure: Critically ill, on vent  Medications for procedure: Fentanyl IV, versed 4mg  IV  Procedure description: The bronchoscope was introduced through the tracheostom tube} and passed to the bilateral lungs to the level of the subsegmental bronchi throughout the tracheobronchial tree.  Airway exam revealed acute inflammation throughout the tracheobronchial tree.  Some frothy mucus in the bronchus intermedius which was partially occluding the airway.  This was suctioned with some difficulty.  At the completion of the procedure the airway was completely patent.    Procedures performed: therapeutic aspiration of mucus  Specimens sent: none  Condition post procedure: critically ill, on vent  EBL: none  Complications: none  , MD [place] PCCM Pager: [phone] Cell: [phone] After 3pm or if no response, call [phone]

## 2018-08-15 ENCOUNTER — Inpatient Hospital Stay (HOSPITAL_COMMUNITY): Payer: Medicaid Other

## 2018-08-15 ENCOUNTER — Telehealth: Payer: Self-pay

## 2018-08-15 LAB — GLUCOSE, CAPILLARY
GLUCOSE-CAPILLARY: 182 mg/dL — AB (ref 70–99)
GLUCOSE-CAPILLARY: 212 mg/dL — AB (ref 70–99)
GLUCOSE-CAPILLARY: 218 mg/dL — AB (ref 70–99)
GLUCOSE-CAPILLARY: 261 mg/dL — AB (ref 70–99)
Glucose-Capillary: 177 mg/dL — ABNORMAL HIGH (ref 70–99)
Glucose-Capillary: 193 mg/dL — ABNORMAL HIGH (ref 70–99)
Glucose-Capillary: 165 mg/dL — ABNORMAL HIGH (ref 70–99)

## 2018-08-15 LAB — CBC
HCT: 32.7 % — ABNORMAL LOW (ref 39.0–52.0)
Hemoglobin: 10.3 g/dL — ABNORMAL LOW (ref 13.0–17.0)
MCH: 27.9 pg (ref 26.0–34.0)
MCHC: 31.5 g/dL (ref 30.0–36.0)
MCV: 88.6 fL (ref 80.0–100.0)
NRBC: 0 % (ref 0.0–0.2)
PLATELETS: 252 10*3/uL (ref 150–400)
RBC: 3.69 MIL/uL — ABNORMAL LOW (ref 4.22–5.81)
RDW: 12.7 % (ref 11.5–15.5)
WBC: 6.2 10*3/uL (ref 4.0–10.5)

## 2018-08-15 LAB — BASIC METABOLIC PANEL
ANION GAP: 4 — AB (ref 5–15)
BUN: 16 mg/dL (ref 8–23)
CALCIUM: 8.5 mg/dL — AB (ref 8.9–10.3)
CO2: 26 mmol/L (ref 22–32)
Chloride: 105 mmol/L (ref 98–111)
Creatinine, Ser: 0.52 mg/dL — ABNORMAL LOW (ref 0.61–1.24)
Glucose, Bld: 271 mg/dL — ABNORMAL HIGH (ref 70–99)
Potassium: 3.7 mmol/L (ref 3.5–5.1)
Sodium: 135 mmol/L (ref 135–145)

## 2018-08-15 LAB — MAGNESIUM: MAGNESIUM: 1.8 mg/dL (ref 1.7–2.4)

## 2018-08-15 LAB — PHOSPHORUS: PHOSPHORUS: 2.6 mg/dL (ref 2.5–4.6)

## 2018-08-15 MED ORDER — CHLORHEXIDINE GLUCONATE 0.12 % MT SOLN
OROMUCOSAL | Status: AC
  Filled 2018-08-15: qty 15

## 2018-08-15 MED ORDER — INSULIN GLARGINE 100 UNIT/ML ~~LOC~~ SOLN
9.0000 [IU] | Freq: Every day | SUBCUTANEOUS | Status: DC
  Administered 2018-08-15 – 2018-08-16 (×2): 9 [IU] via SUBCUTANEOUS
  Filled 2018-08-15 (×3): qty 0.09

## 2018-08-15 MED ORDER — LEVALBUTEROL HCL 1.25 MG/0.5ML IN NEBU
1.2500 mg | INHALATION_SOLUTION | Freq: Four times a day (QID) | RESPIRATORY_TRACT | Status: DC
  Administered 2018-08-15 – 2018-08-20 (×20): 1.25 mg via RESPIRATORY_TRACT
  Filled 2018-08-15 (×23): qty 0.5

## 2018-08-15 NOTE — Progress Notes (Signed)
Inpatient Diabetes Program Recommendations  AACE/ADA: New Consensus Statement on Inpatient Glycemic Control (2015)  Target Ranges:  Prepandial:   less than 140 mg/dL      Peak postprandial:   less than 180 mg/dL (1-2 hours)      Critically ill patients:  140 - 180 mg/dL   Lab Results  Component Value Date   GLUCAP 193 (H) 08/15/2018   HGBA1C 8.1 (H) 07/25/2018    Results for DEMARQUIS, OSLEY (MRN [id number]) as of 08/15/2018 12:33  Ref. Range 08/14/2018 19:37 08/15/2018 00:14 08/15/2018 04:17 08/15/2018 07:42 08/15/2018 11:41  Glucose-Capillary Latest Ref Range: 70 - 99 mg/dL 619 (H) 509 (H) 326 (H) 177 (H) 193 (H)    DM 2  Current meds: Novolog sensitive (0-9 units) Q4 hours.                          Lantus 9 units daily (increased today up from 6 units daily)  Tube feeds at 4ml/hr.     MD please consider following insulin recommendations:  1. Add Novolog 4 units Q4 to cover carbs in tube feeds. (Please add the following hold parameters:  Hold if tube feeding held.)  2. Change to ICU glycemic control order set.    Thank you.  -- Will follow during hospitalization.--  Jamelle Rushing RN, MSN Diabetes Coordinator Inpatient Glycemic Control Team Team Pager: [phone] (8am-5pm)

## 2018-08-15 NOTE — Progress Notes (Signed)
SLP Cancellation Note  Patient Details Name: Wayne Young MRN: [id number] DOB: Oct 24, 1955   Cancelled treatment:       Reason Eval/Treat Not Completed: Medical issues which prohibited therapy. Pt was briefly on ATC this am and was doing well per RT but requested to go back on vent. SLP can attempt PMSV and swallow assessment if pt goes back on ATC, which RT may do today. Will f/u later, can page me at number below.   Harlon Ditty, MA CCC-SLP  Acute Rehabilitation Services Pager [phone] Office [phone]  Wayne Young 08/15/2018, 10:48 AM

## 2018-08-15 NOTE — Evaluation (Signed)
[facility] Speaking Valve - Evaluation Patient Details  Name: Wayne Young MRN: [id number] Date of Birth: [birth year]  Today's Date: 08/15/2018 Time: 1200-1230 SLP Time Calculation (min) (ACUTE ONLY): 30 min  Past Medical History:  Past Medical History:  Diagnosis Date  . Arthritis   . Diabetes mellitus without complication (HCC)   . Glaucoma   . Hypertension    Past Surgical History:  Past Surgical History:  Procedure Laterality Date  . HERNIA REPAIR     HPI:  62 year old man, history of RA and prior methotrexate, latent TB (treated), months of unexplained weight loss. He quit smoking 21 years ago. Admitted with dyspnea and then respiratory failure requiring mechanical ventilation. Treating for left lower lobe pneumonia. He failed extubation on 10/9, experienced an episode suspicious for acute seizure and was reintubated. Empirically on Keppra. Brain imaging and EEG were both reassuring. Extubated 10/1. He was reintubated 10/13 for sudden respiratory distress and inability to clear secretions, concern for intermittent diplopia and weakness Self extubated and re-intubated on 10/23. Starting empiric IVIG treatment x5 on 10/24 for suspicion of MG/LE. Now with trach, primarily on ventilator, but has tolerated a few ATC trials, RT believes anxiety is a factor. NIF -13 day prior to initial SLP assessment.    Assessment / Plan / Recommendation Clinical Impression  Pt demonstrates adequate upper airway patency with PMSV, but breath support to redirect air to upper airway is poor, pt has to take a deep breath and push air with effort to achieve a sustained, breath low volume /a/. Almost aphonic speech difficult to understand unless SLP right next to pts face. He was very happy to hear his voice, but quickly became anxious, RR in mid 20s but pt appeared to use accessory muscles for breathing. Also HR quite low initially, in the 50s, RN reports he has been this way on trach collar, but  otherwise is in the 80s. HR increased to 80 by end of session, but pt was asking to return to vent. Pt may use PMSV for 5-10 minute intervals with full RN/MD/RT supervision given high risk for fatigue, but ability to use valve for brief periods to communicate with family and staff. Will follow for increased tolerance and functional use.  SLP Visit Diagnosis: Dysphagia, oropharyngeal phase (R13.12)    SLP Assessment  Patient needs continued Speech Lanaguage Pathology Services    Follow Up Recommendations       Frequency and Duration min 2x/week  2 weeks    PMSV Trial PMSV was placed for: 5 minute intervals Able to redirect subglottic air through upper airway: Yes Able to Attain Phonation: Yes Voice Quality: Breathy;Low vocal intensity Able to Expectorate Secretions: No Breath Support for Phonation: Severely decreased Intelligibility: Intelligibility reduced Word: 25-49% accurate Phrase: 25-49% accurate Sentence: 25-49% accurate Conversation: Not tested Respirations During Trial: 25 SpO2 During Trial: (95) Pulse During Trial: (initially in the 50s, up to 80s RN aware) Behavior: Alert   Tracheostomy Tube  Additional Tracheostomy Tube Assessment Trach Collar Period: brief intervals throughout the morning Secretion Description: none    Vent Dependency  Vent Dependent: Yes Vent Mode: PSV;CPAP PEEP: 5 cmH20 Pressure Support: 12 cmH20 FiO2 (%): 40 %    Cuff Deflation Trial  GO Tolerated Cuff Deflation: Yes Length of Time for Cuff Deflation Trial: 30 minutes        Airiel Oblinger, Riley Nearing 08/15/2018, 1:36 PM

## 2018-08-15 NOTE — Progress Notes (Signed)
RT note: patient placed on 40% trach collar.  Currently tolerating well.  Will continue to monitor.  

## 2018-08-15 NOTE — Progress Notes (Signed)
NIF -20, VC of 1.1L with good patient effort.

## 2018-08-15 NOTE — Progress Notes (Signed)
RT note: patient placed back on 40% trach collar, currently tolerating well.  Will continue to monitor.

## 2018-08-15 NOTE — Progress Notes (Signed)
RT note: patient called RT to room and asked to be placed back on ventilator.  Placed patient back on ventilator on CPAP/PSV of 12/5 and is currently tolerating well.

## 2018-08-15 NOTE — Evaluation (Signed)
Clinical/Bedside Swallow Evaluation Patient Details  Name: Wayne Young MRN: [id number] Date of Birth: 12-23-1955  Today's Date: 08/15/2018 Time: SLP Start Time (ACUTE ONLY): 1200 SLP Stop Time (ACUTE ONLY): 1230 SLP Time Calculation (min) (ACUTE ONLY): 30 min  Past Medical History:  Past Medical History:  Diagnosis Date  . Arthritis   . Diabetes mellitus without complication (HCC)   . Glaucoma   . Hypertension    Past Surgical History:  Past Surgical History:  Procedure Laterality Date  . HERNIA REPAIR     HPI:  62 year old man, history of RA and prior methotrexate, latent TB (treated), months of unexplained weight loss. He quit smoking 21 years ago. Admitted with dyspnea and then respiratory failure requiring mechanical ventilation. Treating for left lower lobe pneumonia. He failed extubation on 10/9, experienced an episode suspicious for acute seizure and was reintubated. Empirically on Keppra. Brain imaging and EEG were both reassuring. Extubated 10/1. He was reintubated 10/13 for sudden respiratory distress and inability to clear secretions, concern for intermittent diplopia and weakness Self extubated and re-intubated on 10/23. Starting empiric IVIG treatment x5 on 10/24 for suspicion of MG/LE. Now with trach, primarily on ventilator, but has tolerated a few ATC trials, RT believes anxiety is a factor. NIF -13 day prior to initial SLP assessment.    Assessment / Plan / Recommendation Clinical Impression  Pt demonstrates a multitude of risk factors for dysphagia and aspiration. Minimal trials given today and pt immediately reported he was not as excited about POs as he had thought due to his anxiety off the vent. Pt was able to initiate a swallow, but subjectively, laryngeal elevation felt weak, swallow was delayed. Doubtful of pts capacity of protect airway or sense aspiration after mulitple intubations. Also suspect pt would not have the breath support to eject aspirate via  cough given respiratory weakness. Will proceed cautiously with PO trials, pt will likely need a FEES prior to more extensive trials, but he does not have the endurance yet. Will follow for readiness.  SLP Visit Diagnosis: Dysphagia, oropharyngeal phase (R13.12)    Aspiration Risk  Severe aspiration risk;Risk for inadequate nutrition/hydration    Diet Recommendation NPO        Other  Recommendations     Follow up Recommendations LTACH      Frequency and Duration min 2x/week  2 weeks       Prognosis        Swallow Study   General HPI: 62 year old man, history of RA and prior methotrexate, latent TB (treated), months of unexplained weight loss. He quit smoking 21 years ago. Admitted with dyspnea and then respiratory failure requiring mechanical ventilation. Treating for left lower lobe pneumonia. He failed extubation on 10/9, experienced an episode suspicious for acute seizure and was reintubated. Empirically on Keppra. Brain imaging and EEG were both reassuring. Extubated 10/1. He was reintubated 10/13 for sudden respiratory distress and inability to clear secretions, concern for intermittent diplopia and weakness Self extubated and re-intubated on 10/23. Starting empiric IVIG treatment x5 on 10/24 for suspicion of MG/LE. Now with trach, primarily on ventilator, but has tolerated a few ATC trials, RT believes anxiety is a factor. NIF -13 day prior to initial SLP assessment.  Type of Study: Bedside Swallow Evaluation Diet Prior to this Study: NPO;NG Tube History of Recent Intubation: Yes Length of Intubations (days): (multiple) Date extubated: 08/11/18 Behavior/Cognition: Alert;Cooperative Oral Cavity Assessment: Within Functional Limits Oral Care Completed by SLP: No Oral Cavity - Dentition: Adequate natural dentition Vision:  Functional for self-feeding Self-Feeding Abilities: Total assist Patient Positioning: Upright in bed Baseline Vocal Quality: Breathy;Hoarse;Low vocal  intensity Volitional Cough: Weak Volitional Swallow: Able to elicit    Oral/Motor/Sensory Function Overall Oral Motor/Sensory Function: Within functional limits   Ice Chips Ice chips: Impaired Presentation: Spoon Pharyngeal Phase Impairments: Decreased hyoid-laryngeal movement;Suspected delayed Swallow;Wet Vocal Quality   Thin Liquid Thin Liquid: Not tested    Nectar Thick Nectar Thick Liquid: Not tested   Honey Thick Honey Thick Liquid: Not tested   Puree Puree: Not tested   Solid     Solid: Not tested     Harlon Ditty, MA CCC-SLP  Acute Rehabilitation Services Pager [phone] Office [phone]  Eunice Oldaker, Riley Nearing 08/15/2018,1:52 PM

## 2018-08-15 NOTE — Progress Notes (Signed)
PULMONARY / CRITICAL CARE MEDICINE   NAME:  Wayne Young, MRN:  [id number], DOB:  05-10-1956, LOS: 21 ADMISSION DATE:  07/25/2018,   CHIEF COMPLAINT: Dyspnea and declining mental status.  BRIEF HISTORY:        62 year old man, history of RA and prior methotrexate, latent TB (treated), months of unexplained weight loss.  He quit smoking 21 years ago.  Admitted with dyspnea and then respiratory failure requiring mechanical ventilation.  Treating for left lower lobe pneumonia.  He failed extubation on 10/9, experienced an episode suspicious for acute seizure and was reintubated.  Empirically on Keppra.  Brain imaging and EEG were both reassuring. Extubated 10/11 He was reintubated 10/13 for sudden respiratory distress and inability to clear secretions, concern for intermittent diplopia and weakness Self extubated and re-intubated on 10/23 Starting empiric IVIG treatment x5 on 10/24 for suspicion of MG/LE  SIGNIFICANT PAST MEDICAL HISTORY    Rheumatoid arthritis, positive PPD.  SIGNIFICANT EVENTS:  10/17 suction bronch, normal anatomy with thick clear mucous on left partially cleared with saline lavage 10/23 self-extubated and re-intubated 10/23-28 IVIG  STUDIES:   CT chest angio 10/9 >> LLL consolidation CT abd  / pelvis  10/7 >> ? SMA syndrome CT neck 10/7 >> neg MRI brain 10/10 >> chronic ischemic microangiopathy  EEG 10/10 neg  MRI T/ C spine 10/13 >>  mild central disc protrusion C4-6 but no cord compression and no demyelinating lesions  10/13 myasthenia gravis labs > Achr binding antibody: 0.03- normal range.  Achr blocking antibody 22 - normal range Achr modulating Ab from 10/14 now neg  10/23: voltage gated calcium channel lab neg musc antibody >> MG serurm >>  NIF: 10/24 -18 10/26 -13 10/27 -15  CULTURES:  resp 10/14 >>> enterobacter >> CTX sens  ANTIBIOTICS:  Zosyn 10/7 >> 10/17 CTX  10/17 >>>> 10/21  LINES/TUBES:  ETT 10/7 >> 10/9, 10/9 >> 10/11 ,   10/13 >>10/23          10/23>> 10/24 Trach 10/24>> CONSULTANTS:  Neurology >>  SUBJECTIVE:  Patient alert and communicating via writing during early rounds, Says he notices that he is breathing better RT to continue to try to wean as tolerated WBC 6.2   CONSTITUTIONAL: BP (!) 122/93   Pulse 64   Temp 98.6 F (37 C) (Oral)   Resp 16   Ht 5\' 11"  (1.803 m)   Wt 60.6 kg   SpO2 100%   BMI 18.63 kg/m   I/O last 3 completed shifts: In: 3194.4 [I.V.:303.9; NG/GT:2335; IV Piggyback:555.5] Out: 1885 [Urine:1885]     Vent Mode: PRVC FiO2 (%):  [40 %-50 %] 40 % Set Rate:  [16 bmp] 16 bmp Vt Set:  [600 mL] 600 mL PEEP:  [5 cmH20] 5 cmH20 Plateau Pressure:  [15 cmH20-17 cmH20] 15 cmH20  PHYSICAL EXAM:  Frail, alert black male.  Still Communicating in writing and with hand signals due to trach  S1/2, rrr Right lower lobe very improved from 10/27, course sounds diffusely but good air movement throughout Soft abdomen,  ND  CXR 10/28 with improved right lower consolidation from 10/27  RESOLVED PROBLEM LIST   ASSESSMENT AND PLAN    Acute resp failure  -recurrent, etiology unclear - ?for neuromuscular disease ,  ? recurrent aspiration.  We are treating as potential upper neuron disorder.  Repeated mucous plugging.  Nif on 10/27 (-)15.  Now s/p IVIG x5 days.  Voltage gate calcium channel labs neg -per neuro can take 1wk s/p IVIG  for effect (11/3) - Musc antibody and MG serum pending - No reserve 2/2 deconditioning - Sat goals of > 94% - Chest PT as able - PT/OT - Swallow eval once reliably off vent - down 4lbs, up 3L this admission -Daily nifs (-15 10/27)  Aspiration gram-negative pneumonia- improved plugging vs consolodation on lower right on CXR 10/28.  WBC 6.2. Afebrile overnight 10/27 -Trend CBC/ fever curve -Monitor off abx for now -Trend CXR -suction bronchs/bag lavage prn for plugging  GI Protein calorie malnutrition/weight loss. whole-body imaging negative, no  clear etiology apparent, no evidence of active TB Plan - ct tube feeds - cont monitoring K/phos/mag prn  - prn biscodyl  Renal Adequate output, up 3L this admission Plan Replete electrolytes prn Trend BMET  Endocrine- needed 21u short acting since lantus 6 10/27, cbgs >200 DM Plan CBG's Q 4 SSI  increase lantus to 9u  Eventual Outpatient plan: Neuro: -will need single fiber EMG/RNS testing as outpatient 1-2 weeks on discharge. -Keppra can potentially be discontinued as outpatient.    SUMMARY OF TODAY'S PLAN:   MG serum and musc antibody pending Increase lantus to 9u Daily NIF, may take until 11/3 for IVIG to show full effect if this is neuro related  Best Practice / Goals of Care / Disposition.    DVT PROPHYLAXIS: Lovenox SUP: Pepcid Updated wife at bedside daily   LABS  Glucose Recent Labs  Lab 08/14/18 0738 08/14/18 1124 08/14/18 1531 08/14/18 1937 08/15/18 0014 08/15/18 0417  GLUCAP 234* 234* 260* 205* 212* 261*    BMET Recent Labs  Lab 08/13/18 0407 08/14/18 0443 08/15/18 0410  NA 136 138 135  K 3.1* 3.2* 3.7  CL 103 101 105  CO2 27 26 26   BUN 15 13 16   CREATININE 0.56* 0.47* 0.52*  GLUCOSE 231* 256* 271*    Liver Enzymes Recent Labs  Lab 08/09/18 0457 08/10/18 0528  AST 13* 27  ALT 17 24  ALKPHOS 34* 49  BILITOT 0.5 0.8  ALBUMIN 2.3* 2.5*    Electrolytes Recent Labs  Lab 08/12/18 0441 08/13/18 0407 08/14/18 0443 08/15/18 0410  CALCIUM 8.7* 8.7* 8.9 8.5*  MG 2.0  --  1.9 1.8  PHOS 3.0  --  2.3* 2.6    CBC Recent Labs  Lab 08/13/18 0407 08/14/18 0443 08/15/18 0410  WBC 6.2 7.0 6.2  HGB 11.9* 10.6* 10.3*  HCT 37.1* 33.2* 32.7*  PLT 335 285 252    ABG Recent Labs  Lab 08/10/18 0540  PHART 7.308*  PCO2ART 64.6*  PO2ART 279.0*    Coag's Recent Labs  Lab 08/10/18 1626 08/11/18 0427  APTT 31 33  INR 1.36 1.31    Sepsis Markers No results for input(s): LATICACIDVEN, PROCALCITON, O2SATVEN in the last  168 hours.  Cardiac Enzymes No results for input(s): TROPONINI, PROBNP in the last 168 hours.  08/12/18, DO  PGY2 [facility] 08/15/2018 7:46 AM

## 2018-08-15 NOTE — Progress Notes (Signed)
CSW continues to follow for further needs at this time. CSW aware that pt had trach placed on 08/11/18. CSW also aware that pt remains on vent at this time .CSW will continue to follow for possible discuss with family (wife) regarding vent/snf placement if pt is unable to wean off vent.    Claude Manges Ceana Fiala, MSW, LCSW-A Emergency Department Clinical Social Worker [phone]

## 2018-08-15 NOTE — Telephone Encounter (Signed)
LMTCB. Patient needs appt for Hospital F/U.

## 2018-08-16 ENCOUNTER — Inpatient Hospital Stay (HOSPITAL_COMMUNITY): Payer: Medicaid Other

## 2018-08-16 LAB — BASIC METABOLIC PANEL
Anion gap: 7 (ref 5–15)
BUN: 17 mg/dL (ref 8–23)
CALCIUM: 8.5 mg/dL — AB (ref 8.9–10.3)
CO2: 25 mmol/L (ref 22–32)
CREATININE: 0.48 mg/dL — AB (ref 0.61–1.24)
Chloride: 103 mmol/L (ref 98–111)
Glucose, Bld: 189 mg/dL — ABNORMAL HIGH (ref 70–99)
Potassium: 3.4 mmol/L — ABNORMAL LOW (ref 3.5–5.1)
SODIUM: 135 mmol/L (ref 135–145)

## 2018-08-16 LAB — CBC
HCT: 31.4 % — ABNORMAL LOW (ref 39.0–52.0)
Hemoglobin: 10.2 g/dL — ABNORMAL LOW (ref 13.0–17.0)
MCH: 28.3 pg (ref 26.0–34.0)
MCHC: 32.5 g/dL (ref 30.0–36.0)
MCV: 87 fL (ref 80.0–100.0)
PLATELETS: 261 10*3/uL (ref 150–400)
RBC: 3.61 MIL/uL — ABNORMAL LOW (ref 4.22–5.81)
RDW: 12.7 % (ref 11.5–15.5)
WBC: 5.6 10*3/uL (ref 4.0–10.5)
nRBC: 0 % (ref 0.0–0.2)

## 2018-08-16 LAB — GLUCOSE, CAPILLARY
GLUCOSE-CAPILLARY: 121 mg/dL — AB (ref 70–99)
GLUCOSE-CAPILLARY: 141 mg/dL — AB (ref 70–99)
GLUCOSE-CAPILLARY: 202 mg/dL — AB (ref 70–99)
Glucose-Capillary: 126 mg/dL — ABNORMAL HIGH (ref 70–99)
Glucose-Capillary: 228 mg/dL — ABNORMAL HIGH (ref 70–99)
Glucose-Capillary: 224 mg/dL — ABNORMAL HIGH (ref 70–99)

## 2018-08-16 LAB — MAGNESIUM: MAGNESIUM: 1.7 mg/dL (ref 1.7–2.4)

## 2018-08-16 LAB — PHOSPHORUS: Phosphorus: 2.8 mg/dL (ref 2.5–4.6)

## 2018-08-16 MED ORDER — POTASSIUM CHLORIDE 20 MEQ/15ML (10%) PO SOLN
40.0000 meq | Freq: Once | ORAL | Status: AC
  Administered 2018-08-16: 40 meq via ORAL
  Filled 2018-08-16: qty 30

## 2018-08-16 MED ORDER — MAGNESIUM SULFATE IN D5W 1-5 GM/100ML-% IV SOLN
1.0000 g | Freq: Once | INTRAVENOUS | Status: AC
  Administered 2018-08-16: 1 g via INTRAVENOUS
  Filled 2018-08-16: qty 100

## 2018-08-16 MED ORDER — CHLORHEXIDINE GLUCONATE 0.12 % MT SOLN
OROMUCOSAL | Status: AC
  Filled 2018-08-16: qty 15

## 2018-08-16 NOTE — Progress Notes (Signed)
CSW spoke with pt and wife at bedside. CSW updated pt and wife on potential out of state placement in the event that pt is unable to wean off of vent. Pt expressed understanding as well as pt's wife expressed being agreeable to vent/snf placement as needed. CSW will continue to follow for any further needs.     Claude Manges Garv Kuechle, MSW, LCSW-A Emergency Department Clinical Social Worker [phone]

## 2018-08-16 NOTE — Progress Notes (Addendum)
SLP Cancellation Note  Patient Details Name: Wayne Young MRN: [id number] DOB: 04-04-1956   Cancelled treatment:       Reason Eval/Treat Not Completed: Medical issues which prohibited therapy. Pt not tolerating ATC this am. If he tolerates ATC a little later and respiratory function is stable, staff may deflate cuff and place PMSV briefly (about 5 min was all he could tolerate yesterday) with full supervision.   [name], Riley Nearing 08/16/2018, 10:00 AM

## 2018-08-16 NOTE — Progress Notes (Signed)
PULMONARY / CRITICAL CARE MEDICINE   NAME:  Wayne Young, MRN:  [id number], DOB:  [birth year], LOS: 22 ADMISSION DATE:  07/25/2018,   CHIEF COMPLAINT: Dyspnea and declining mental status.  BRIEF HISTORY:        62 year old man, history of RA and prior methotrexate, latent TB (treated), months of unexplained weight loss.  He quit smoking 21 years ago.  Admitted with dyspnea and then respiratory failure requiring mechanical ventilation.  Treating for left lower lobe pneumonia.  He failed extubation on 10/9, experienced an episode suspicious for acute seizure and was reintubated.  Empirically on Keppra.  Brain imaging and EEG were both reassuring. Extubated 10/11 He was reintubated 10/13 for sudden respiratory distress and inability to clear secretions, concern for intermittent diplopia and weakness Self extubated and re-intubated on 10/23 empiric IVIG treatment x5 on 10/24 for suspicion of MG/LE  SIGNIFICANT PAST MEDICAL HISTORY    Rheumatoid arthritis, positive PPD.  SIGNIFICANT EVENTS:  10/17 suction bronch, normal anatomy with thick clear mucous on left partially cleared with saline lavage 10/23 self-extubated and re-intubated 10/23-28 IVIG  STUDIES:   CT chest angio 10/9 >> LLL consolidation CT abd  / pelvis  10/7 >> ? SMA syndrome CT neck 10/7 >> neg MRI brain 10/10 >> chronic ischemic microangiopathy  EEG 10/10 neg  MRI T/ C spine 10/13 >>  mild central disc protrusion C4-6 but no cord compression and no demyelinating lesions  10/13 myasthenia gravis labs > Achr binding antibody: 0.03- normal range.  Achr blocking antibody 22 - normal range Achr modulating Ab from 10/14 now neg  10/23: voltage gated calcium channel lab neg musc antibody >> MG serurm >>  NIF: 10/24 -18 10/26 -13 10/27 -15 10/28 -20  CULTURES:  resp 10/14 >>> enterobacter >> CTX sens  ANTIBIOTICS:  Zosyn 10/7 >> 10/17 CTX  10/17 >>>> 10/21  LINES/TUBES:  ETT 10/7 >> 10/9, 10/9 >> 10/11 ,           10/13 >>10/23          10/23>> 10/24 Trach 10/24>> CONSULTANTS:  Neurology >>  SUBJECTIVE:  Now with speaking valve on trach, not verbalizing regularly on it, still takes significant effort   CONSTITUTIONAL: BP 125/73   Pulse 72   Temp 98.5 F (36.9 C) (Oral)   Resp 16   Ht 5\' 11"  (1.803 m)   Wt 61.6 kg   SpO2 100%   BMI 18.94 kg/m   I/O last 3 completed shifts: In: 2994.8 [I.V.:229.5; 09-16-1985; IV Piggyback:200.3] Out: 1965 [Urine:1965]  Vent Mode: PRVC FiO2 (%):  [4 %-40 %] 40 % Set Rate:  [16 bmp] 16 bmp Vt Set:  [600 mL] 600 mL PEEP:  [5 cmH20] 5 cmH20 Pressure Support:  [12 cmH20] 12 cmH20 Plateau Pressure:  [15 cmH20-19 cmH20] 19 cmH20  PHYSICAL EXAM:  Frail, alert black male.  Still largely Communicating in writing and with hand signals due to trach despite speaking valve (takes a lot of effort for him) S1/2, rrr Right lobe still minimally decreased from rest of thoracic but not as bad as prior exams Soft abdomen,  ND  CXR 10/29 still with right lower consolidation consistent from 10/28  RESOLVED PROBLEM LIST   ASSESSMENT AND PLAN    Acute resp failure  -recurrent, etiology unclear - ?for neuromuscular disease ,  ? recurrent aspiration.  We are treating as potential upper neuron disorder.  Repeated mucous plugging.  Nif on 10/27 (-)15.  Now s/p IVIG x5 days.  Voltage gate  calcium channel labs neg -per neuro can take 1wk s/p IVIG for effect (11/3) - Musc antibody (7-15day turn around ) and MG serum (~14 turn around) pending from 10/23 - No reserve 2/2 deconditioning - Sat goals of > 94% - Chest PT as tolerated - PT/OT - Swallow eval once reliably off vent - down 2lbs, up 3.7L this admission -Daily nifs (-20 10/28) -considering muscle biopsy on chance this is some other etiology  Aspiration gram-negative pneumonia- improved plugging vs consolodation on lower right on CXR 10/28.  WBC 6.2. Afebrile overnight 10/27 -Trend CBC/ fever curve -Monitor off  abx for now -Trend CXR -bag lavage>suction bronch prn for plugging  GI Protein calorie malnutrition/weight loss. whole-body imaging negative, no clear etiology apparent, no evidence of active TB Plan - ct tube feeds - cont monitoring K/phos/mag prn  - prn biscodyl  Renal Adequate output, up 3.6L this admission Plan Replete electrolytes prn Trend BMET (replete K and mag 10/29)  Endocrine- needed 10u short acting since lantus 9 10/28, most cbgs now <200 and AM 126 DM Plan CBG's Q 4 SSI  cont lantus to 9u  Eventual Outpatient plan: Neuro: -will need single fiber EMG/RNS testing as outpatient 1-2 weeks on discharge. -Keppra can potentially be discontinued as outpatient.    SUMMARY OF TODAY'S PLAN:   MG serum and musc antibody pending Daily NIF, may take until 11/3 for IVIG to show full effect if this is neuro related Considering utility of muscle biopsy  Best Practice / Goals of Care / Disposition.    DVT PROPHYLAXIS: Lovenox SUP: Pepcid Updated wife at bedside daily   LABS  Glucose Recent Labs  Lab 08/15/18 1141 08/15/18 1607 08/15/18 1935 08/15/18 2330 08/16/18 0308 08/16/18 0746  GLUCAP 193* 165* 218* 182* 126* 228*    BMET Recent Labs  Lab 08/14/18 0443 08/15/18 0410 08/16/18 0524  NA 138 135 135  K 3.2* 3.7 3.4*  CL 101 105 103  CO2 26 26 25   BUN 13 16 17   CREATININE 0.47* 0.52* 0.48*  GLUCOSE 256* 271* 189*    Liver Enzymes Recent Labs  Lab 08/10/18 0528  AST 27  ALT 24  ALKPHOS 49  BILITOT 0.8  ALBUMIN 2.5*    Electrolytes Recent Labs  Lab 08/14/18 0443 08/15/18 0410 08/16/18 0524  CALCIUM 8.9 8.5* 8.5*  MG 1.9 1.8 1.7  PHOS 2.3* 2.6 2.8    CBC Recent Labs  Lab 08/14/18 0443 08/15/18 0410 08/16/18 0524  WBC 7.0 6.2 5.6  HGB 10.6* 10.3* 10.2*  HCT 33.2* 32.7* 31.4*  PLT 285 252 261    ABG Recent Labs  Lab 08/10/18 0540  PHART 7.308*  PCO2ART 64.6*  PO2ART 279.0*    Coag's Recent Labs  Lab  08/10/18 1626 08/11/18 0427  APTT 31 33  INR 1.36 1.31    Sepsis Markers No results for input(s): LATICACIDVEN, PROCALCITON, O2SATVEN in the last 168 hours.  Cardiac Enzymes No results for input(s): TROPONINI, PROBNP in the last 168 hours.  08/12/18, DO  PGY2 [facility] 08/16/2018 8:36 AM

## 2018-08-16 NOTE — Progress Notes (Signed)
Physical Therapy Treatment Patient Details Name: Wayne Young MRN: [id number] DOB: Jul 08, 1956 Today's Date: 08/16/2018    History of Present Illness 62 y.o. male admitted with dyspnea and respiratory failure requiring mechanical ventilation. Extubated 10/11, re-intubated 10/13, self extubated and reintubated 10/23, trach 10/24, Cortrak 10/25. Has experienced gradual weakness with falls since this past June per family, neurology work up for MG/neuromuscular etiology pending at time of PT evaluation. PMH includes but not limted to: COPD, RA, unexplained weight loss. Pt being treated emperically with IVIG.  S/p trach 08/11/18.    PT Comments    Pt reluctantly agreeable to work with PT.  He continues to be limited by fear and anxiety.  He did well, walking a very short distance in his room on vent (PRVC FiO2 40%, PEEP5) with VSS.  RN deep suctioned him before and after gait.  RR, O2 sats and HR remained stable.  Wife encouraging him to move.  I told him that I have a goal of walking into the hallway (ventilated) next session.  PT will coordinate with RT to complete.  Goals are due next session.  PT will continue to follow acutely for safe mobility progression  Follow Up Recommendations  CIR     Equipment Recommendations  Rolling walker with 5" wheels    Recommendations for Other Services OT consult     Precautions / Restrictions Precautions Precautions: Fall Precaution Comments: on vent via trach Restrictions Weight Bearing Restrictions: No    Mobility  Bed Mobility Overal bed mobility: Needs Assistance Bed Mobility: Sit to Supine       Sit to supine: Mod assist   General bed mobility comments: Mod assist to help lift both legs back into bed from sitting.   Transfers Overall transfer level: Needs assistance Equipment used: Rolling walker (2 wheeled) Transfers: Sit to/from Stand Sit to Stand: Min assist;+2 safety/equipment         General transfer comment: Two person  min assist to stand from low recliner chair up to RW.  Assist at trunk to power up, second person to manage lines (vent, monitor, IV pole, feeding tube).   Ambulation/Gait Ambulation/Gait assistance: Mod assist;+2 safety/equipment Gait Distance (Feet): 8 Feet Assistive device: Rolling walker (2 wheeled) Gait Pattern/deviations: Step-through pattern;Shuffle     General Gait Details: Pt walked from chair to bed having to turn "the long way" due to trying not to tangle in his vent tubing.  RN deep suctioned before and after walk.  Pt did very well and I encouraged him that we were going to try to make it into the hallway next session (even on the ventilator).  RR and O2 sats remained stable with short distance gait in room.           Balance Overall balance assessment: Needs assistance Sitting-balance support: Feet supported;Bilateral upper extremity supported Sitting balance-Leahy Scale: Fair Sitting balance - Comments: pt able to sit EOB with bil UE support with close supervision.     Standing balance support: Bilateral upper extremity supported Standing balance-Leahy Scale: Poor Standing balance comment: needs external support from RW and therapist.                             Cognition Arousal/Alertness: Awake/alert Behavior During Therapy: Anxious Overall Cognitive Status: Difficult to assess  General Comments: Pt remains anxious and seemingly fearful of progressive mobility.  He fights me on in until the end and then usually participates.  His wife is encouraging and supportive of him moving as well as Charity fundraiser.          General Comments General comments (skin integrity, edema, etc.): vent- PRVC 40% FiO2, PEEP 5      Pertinent Vitals/Pain Pain Assessment: No/denies pain           PT Goals (current goals can now be found in the care plan section) Acute Rehab PT Goals Patient Stated Goal: "Return to normal" Progress  towards PT goals: Progressing toward goals;PT to reassess next treatment(goals due next session. )    Frequency    Min 3X/week      PT Plan Current plan remains appropriate       AM-PAC PT "6 Clicks" Daily Activity  Outcome Measure  Difficulty turning over in bed (including adjusting bedclothes, sheets and blankets)?: Unable Difficulty moving from lying on back to sitting on the side of the bed? : Unable Difficulty sitting down on and standing up from a chair with arms (e.g., wheelchair, bedside commode, etc,.)?: Unable Help needed moving to and from a bed to chair (including a wheelchair)?: A Little Help needed walking in hospital room?: A Lot Help needed climbing 3-5 steps with a railing? : Total 6 Click Score: 9    End of Session Equipment Utilized During Treatment: Gait belt;Oxygen;Other (comment)(vent- PRVC 40% FiO2, PEEP 5) Activity Tolerance: Patient limited by fatigue(limited by anxiety) Patient left: in bed;with call bell/phone within reach;with family/visitor present;Other (comment)(with bed in chair mode)   PT Visit Diagnosis: Unsteadiness on feet (R26.81);Muscle weakness (generalized) (M62.81);History of falling (Z91.81)     Time: 1330-1400 PT Time Calculation (min) (ACUTE ONLY): 30 min  Charges:  $Therapeutic Activity: 23-37 mins                    Fatima Fedie B. Yeray Tomas, PT, DPT  Acute Rehabilitation [phone] pager #[phone] office   08/16/2018, 4:40 PM

## 2018-08-16 NOTE — Progress Notes (Signed)
RT note: patient placed on CPAP/PSV 18/5.  Currently tolerating well.  Will continue to monitor.

## 2018-08-16 NOTE — Progress Notes (Deleted)
CRITICAL VALUE ALERT  Critical Value:  Troponin 0.03  Date & Time Notied:  08/16/18 2150  Provider Notified: elink  Orders Received/Actions taken: n/a

## 2018-08-16 NOTE — Progress Notes (Signed)
MD and RT at bedside with patient attempting trach collar. While on trach collar, pt experienced respiratory distress and bradycardia to 30s with MD present.   Will notify MD and RT with further concerns as patient is now comfortable resting on ventilator.

## 2018-08-16 NOTE — Progress Notes (Addendum)
Inpatient Diabetes Program Recommendations  AACE/ADA: New Consensus Statement on Inpatient Glycemic Control (2019)  Target Ranges:  Prepandial:   less than 140 mg/dL      Peak postprandial:   less than 180 mg/dL (1-2 hours)      Critically ill patients:  140 - 180 mg/dL   Results for MACAI, SISNEROS (MRN [id number]) as of 08/16/2018 12:35  Ref. Range 08/15/2018 07:42 08/15/2018 11:41 08/15/2018 16:07 08/15/2018 19:35 08/15/2018 23:30 08/16/2018 03:08 08/16/2018 07:46 08/16/2018 11:11  Glucose-Capillary Latest Ref Range: 70 - 99 mg/dL 628 (H) 366 (H) 294 (H) 218 (H) 182 (H) 126 (H) 228 (H) 224 (H)   Review of Glycemic Control  Current orders for Inpatient glycemic control:  Lantus 9 units daily, Novolog 0-9 units Q4H  Inpatient Diabetes Program Recommendations:  Insulin - Tube Feeding Coverage: Please consider ordering Novolog 2 units Q4H for tube feeding coverage. If tube feeding is stopped or held then Novolog tube feeding coverage should be stopped or held as well.  Thanks, Orlando Penner, RN, MSN, CDE Diabetes Coordinator Inpatient Diabetes Program [phone] (Team Pager from 8am to 5pm)

## 2018-08-16 NOTE — Progress Notes (Signed)
Attempted to place patient on trach collar this AM after metaneb treatment.  Patient started to use accessory muscles and began to say he couldn't catch his breath.  Placed patient back on ventilator on CPAP/PSV of 15/5.  MD notified and came to patient's room.  MD wanted to see how patient looked while on trach collar.  Patient began to have increased work of breathing again and stating he wasn't getting enough air.  Placed patient back on ventilator and placed on full support.  Coordinated plan of care with MD, RN, and RT.  Goal is to get patient to chair, work with PT with walking patient while on ventilator, then begin to wean patient down to trach collar.  Will continue to monitor.

## 2018-08-16 NOTE — Progress Notes (Signed)
NIF of -20, VC of 1.2L with good patient effort.

## 2018-08-17 ENCOUNTER — Inpatient Hospital Stay (HOSPITAL_COMMUNITY): Payer: Medicaid Other

## 2018-08-17 LAB — BASIC METABOLIC PANEL
Anion gap: 6 (ref 5–15)
BUN: 17 mg/dL (ref 8–23)
CALCIUM: 8.6 mg/dL — AB (ref 8.9–10.3)
CO2: 26 mmol/L (ref 22–32)
CREATININE: 0.52 mg/dL — AB (ref 0.61–1.24)
Chloride: 104 mmol/L (ref 98–111)
GFR calc Af Amer: >60 mL/min (ref >60)
GLUCOSE: 207 mg/dL — AB (ref 70–99)
POTASSIUM: 3.4 mmol/L — AB (ref 3.5–5.1)
SODIUM: 136 mmol/L (ref 135–145)

## 2018-08-17 LAB — GLUCOSE, CAPILLARY
GLUCOSE-CAPILLARY: 159 mg/dL — AB (ref 70–99)
GLUCOSE-CAPILLARY: 169 mg/dL — AB (ref 70–99)
GLUCOSE-CAPILLARY: 182 mg/dL — AB (ref 70–99)
GLUCOSE-CAPILLARY: 223 mg/dL — AB (ref 70–99)
Glucose-Capillary: 213 mg/dL — ABNORMAL HIGH (ref 70–99)
Glucose-Capillary: 248 mg/dL — ABNORMAL HIGH (ref 70–99)

## 2018-08-17 LAB — CBC
HCT: 31.4 % — ABNORMAL LOW (ref 39.0–52.0)
Hemoglobin: 10.3 g/dL — ABNORMAL LOW (ref 13.0–17.0)
MCH: 28.8 pg (ref 26.0–34.0)
MCHC: 32.8 g/dL (ref 30.0–36.0)
MCV: 87.7 fL (ref 80.0–100.0)
PLATELETS: 241 10*3/uL (ref 150–400)
RBC: 3.58 MIL/uL — ABNORMAL LOW (ref 4.22–5.81)
RDW: 12.7 % (ref 11.5–15.5)
WBC: 5.2 10*3/uL (ref 4.0–10.5)
nRBC: 0 % (ref 0.0–0.2)

## 2018-08-17 LAB — MISC LABCORP TEST (SEND OUT): Labcorp test code: 504600

## 2018-08-17 LAB — ACETYLCHOLINE RECEPTOR AB, ALL
Acety choline binding ab: 0.03 nmol/L (ref 0.00–0.24)
Acetylchol Block Ab: 19 % (ref 0–25)

## 2018-08-17 MED ORDER — INSULIN GLARGINE 100 UNIT/ML ~~LOC~~ SOLN
10.0000 [IU] | Freq: Every day | SUBCUTANEOUS | Status: DC
  Administered 2018-08-17 – 2018-08-20 (×4): 10 [IU] via SUBCUTANEOUS
  Filled 2018-08-17 (×4): qty 0.1

## 2018-08-17 MED ORDER — POTASSIUM CHLORIDE 10 MEQ/50ML IV SOLN
10.0000 meq | INTRAVENOUS | Status: AC
  Administered 2018-08-17 (×2): 10 meq via INTRAVENOUS
  Filled 2018-08-17 (×2): qty 50

## 2018-08-17 NOTE — Progress Notes (Signed)
Inpatient Diabetes Program Recommendations  AACE/ADA: New Consensus Statement on Inpatient Glycemic Control (2015)  Target Ranges:  Prepandial:   less than 140 mg/dL      Peak postprandial:   less than 180 mg/dL (1-2 hours)      Critically ill patients:  140 - 180 mg/dL   Lab Results  Component Value Date   GLUCAP 248 (H) 08/17/2018   HGBA1C 8.1 (H) 07/25/2018   Results for Wayne Young, Wayne Young (MRN [id number]) as of 08/17/2018 13:22  Ref. Range 08/16/2018 23:40 08/17/2018 03:42 08/17/2018 07:36 08/17/2018 11:26  Glucose-Capillary Latest Ref Range: 70 - 99 mg/dL 829 (H) 562 (H) 130 (H) 248 (H)    Review of Glycemic Control  Current orders for Inpatient glycemic control:  Lantus 10 units daily (increased today from 9 units)                                                                           Novolog sensitive correction (0-9) units Q4H    MD please consider the following recommendations:  1. Novolog 2 units Q4H for tube feeding coverage.  If tube feeding is stopped or held then Novolog tube feeding coverage should be stopped or held as well.  2. Change to ICU glycemic control order set.   Thank you.  -- Will follow during hospitalization.--  Jamelle Rushing RN, MSN Diabetes Coordinator Inpatient Glycemic Control Team Team Pager: [phone] (8am-5pm)

## 2018-08-17 NOTE — Progress Notes (Signed)
Occupational Therapy Treatment Patient Details Name: Wayne Young MRN: [id number] DOB: 06/24/1956 Today's Date: 08/17/2018    History of present illness 62 y.o. male admitted with dyspnea and respiratory failure requiring mechanical ventilation. Extubated 10/11, re-intubated 10/13, self extubated and reintubated 10/23, trach 10/24, Cortrak 10/25. Has experienced gradual weakness with falls since this past June per family, neurology work up for MG/neuromuscular etiology pending at time of PT evaluation. PMH includes but not limted to: COPD, RA, unexplained weight loss. Pt being treated emperically with IVIG.  S/p trach 08/11/18.   OT comments  Pt returning to bed with nursing after walking with PT and RT and sitting up in chair. Willing to work with OT on UE strengthening. Pt performed exercises with level 3 theraband. Left pt with 4 exercises for him to do on his own: horizontal abduction, shoulder flexion anchoring end with opposite hand, elbow flexion and extension. Pt demonstrated understanding.    Follow Up Recommendations  CIR    Equipment Recommendations  3 in 1 bedside commode    Recommendations for Other Services      Precautions / Restrictions Precautions Precautions: Fall Precaution Comments: on vent via trach       Mobility Bed Mobility Overal bed mobility: Needs Assistance         Sit to supine: Supervision   General bed mobility comments: to return to supine  Transfers                 General transfer comment: pt seated at EOB with nursing upon arrival    Balance Overall balance assessment: Needs assistance   Sitting balance-Leahy Scale: Fair                                     ADL either performed or assessed with clinical judgement   ADL                   Upper Body Dressing : Minimal assistance;Sitting Upper Body Dressing Details (indicate cue type and reason): doffed front opening gown                          Vision       Perception     Praxis      Cognition Arousal/Alertness: Awake/alert Behavior During Therapy: Flat affect Overall Cognitive Status: Difficult to assess                                 General Comments: pt writes to communicate        Exercises Exercises: General Upper Extremity General Exercises - Upper Extremity Shoulder Flexion: Strengthening;Both;10 reps;Supine;Theraband Theraband Level (Shoulder Flexion): Level 3 (Green) Shoulder Extension: Strengthening;Both;10 reps;Supine;Theraband Theraband Level (Shoulder Extension): Level 3 (Green) Shoulder Horizontal ABduction: Strengthening;Both;10 reps;Supine;Theraband Theraband Level (Shoulder Horizontal Abduction): Level 3 (Green) Elbow Flexion: Strengthening;Both;10 reps;Supine;Theraband Theraband Level (Elbow Flexion): Level 3 (Green) Elbow Extension: Strengthening;Both;10 reps;Supine;Theraband Theraband Level (Elbow Extension): Level 3 (Green)   Shoulder Instructions       General Comments      Pertinent Vitals/ Pain       Pain Assessment: Faces Faces Pain Scale: No hurt  Home Living  Prior Functioning/Environment              Frequency  Min 2X/week        Progress Toward Goals  OT Goals(current goals can now be found in the care plan section)  Progress towards OT goals: Progressing toward goals  Acute Rehab OT Goals Patient Stated Goal: "Return to normal" OT Goal Formulation: With patient/family Time For Goal Achievement: 08/22/18 Potential to Achieve Goals: Good  Plan Discharge plan remains appropriate    Co-evaluation                 AM-PAC PT "6 Clicks" Daily Activity     Outcome Measure   Help from another person eating meals?: Total Help from another person taking care of personal grooming?: A Little Help from another person toileting, which includes using toliet, bedpan, or urinal?: A  Lot Help from another person bathing (including washing, rinsing, drying)?: A Lot Help from another person to put on and taking off regular upper body clothing?: A Little Help from another person to put on and taking off regular lower body clothing?: A Lot 6 Click Score: 13    End of Session    OT Visit Diagnosis: Muscle weakness (generalized) (M62.81)   Activity Tolerance Patient tolerated treatment well   Patient Left in bed;with call bell/phone within reach;with family/visitor present;with nursing/sitter in room   Nurse Communication          Time: 0347-4259 OT Time Calculation (min): 15 min  Charges: OT General Charges $OT Visit: 1 Visit OT Treatments $Therapeutic Exercise: 8-22 mins  Wayne Young, OTR/L Acute Rehabilitation Services Pager: [phone] Office: [phone]   Wayne Young 08/17/2018, 3:55 PM

## 2018-08-17 NOTE — Progress Notes (Signed)
Physical Therapy Treatment Patient Details Name: Wayne Young MRN: [id number] DOB: 1956/02/03 Today's Date: 08/17/2018    History of Present Illness 62 y.o. male admitted with dyspnea and respiratory failure requiring mechanical ventilation. Extubated 10/11, re-intubated 10/13, self extubated and reintubated 10/23, trach 10/24, Cortrak 10/25. Has experienced gradual weakness with falls since this past June per family, neurology work up for MG/neuromuscular etiology pending at time of PT evaluation. PMH includes but not limted to: COPD, RA, unexplained weight loss. Pt being treated emperically with IVIG.  S/p trach 08/11/18.    PT Comments    Pt demonstrating progress this visit, with improved tolerance and less anxiety to mobility with presence of RT during visit. Ambulating 25' in hallway with WNL SpO2 and  RR <30,  On PRVC 100% FiO2,PEEP 5. (he is resting at 40% FiO2 conformability). Pt witting communication today, "everyone thinks I don't want to walk but I do, I just need to be comfortable". Pt also expressing desire to mobilize more and return to baseline. Will to cont to follow and recommend coordinating with RT for future sessions while on ventilation.     Follow Up Recommendations  CIR     Equipment Recommendations  Rolling walker with 5" wheels    Recommendations for Other Services OT consult     Precautions / Restrictions Precautions Precautions: Fall Precaution Comments: on vent via trach Restrictions Weight Bearing Restrictions: No    Mobility  Bed Mobility Overal bed mobility: Needs Assistance         Sit to supine: Supervision   General bed mobility comments: to return to supine  Transfers Overall transfer level: Needs assistance Equipment used: Rolling walker (2 wheeled) Transfers: Sit to/from Stand Sit to Stand: Min assist;+2 safety/equipment         General transfer comment: Min A to provide stability, line  mgt  Ambulation/Gait Ambulation/Gait assistance: Min assist Gait Distance (Feet): 25 Feet Assistive device: Rolling walker (2 wheeled) Gait Pattern/deviations: Step-through pattern;Shuffle     General Gait Details: Pt walked from chair to bed having to turn "the long way" due to trying not to tangle in his vent tubing.  RN deep suctioned before and after walk.  Pt did very well and I encouraged him that we were going to try to make it into the hallway next session (even on the ventilator).  RR and O2 sats remained stable with short distance gait in room.    Stairs             Wheelchair Mobility    Modified Rankin (Stroke Patients Only)       Balance Overall balance assessment: Needs assistance Sitting-balance support: Feet supported;Bilateral upper extremity supported Sitting balance-Leahy Scale: Fair     Standing balance support: Bilateral upper extremity supported Standing balance-Leahy Scale: Poor Standing balance comment: needs external support from RW and therapist.                             Cognition Arousal/Alertness: Awake/alert Behavior During Therapy: Flat affect Overall Cognitive Status: Difficult to assess                                 General Comments: pt writes to communicate      Exercises General Exercises - Upper Extremity Shoulder Flexion: Strengthening;Both;10 reps;Supine;Theraband Theraband Level (Shoulder Flexion): Level 3 (Green) Shoulder Extension: Strengthening;Both;10 reps;Supine;Theraband Theraband Level (Shoulder Extension): Level  3 (Green) Shoulder Horizontal ABduction: Strengthening;Both;10 reps;Supine;Theraband Theraband Level (Shoulder Horizontal Abduction): Level 3 (Green) Elbow Flexion: Strengthening;Both;10 reps;Supine;Theraband Theraband Level (Elbow Flexion): Level 3 (Green) Elbow Extension: Strengthening;Both;10 reps;Supine;Theraband Theraband Level (Elbow Extension): Level 3 (Green)     General Comments        Pertinent Vitals/Pain Pain Assessment: Faces Faces Pain Scale: No hurt    Home Living                      Prior Function            PT Goals (current goals can now be found in the care plan section) Acute Rehab PT Goals Patient Stated Goal: "Return to normal" PT Goal Formulation: With patient Time For Goal Achievement: 08/31/18 Potential to Achieve Goals: Fair Progress towards PT goals: Progressing toward goals    Frequency    Min 3X/week      PT Plan Current plan remains appropriate    Co-evaluation              AM-PAC PT "6 Clicks" Daily Activity  Outcome Measure  Difficulty turning over in bed (including adjusting bedclothes, sheets and blankets)?: Unable Difficulty moving from lying on back to sitting on the side of the bed? : Unable Difficulty sitting down on and standing up from a chair with arms (e.g., wheelchair, bedside commode, etc,.)?: Unable Help needed moving to and from a bed to chair (including a wheelchair)?: A Little Help needed walking in hospital room?: A Lot Help needed climbing 3-5 steps with a railing? : Total 6 Click Score: 9    End of Session Equipment Utilized During Treatment: Gait belt;Oxygen;Other (comment)(PRVC 40% FiO2, PEEP 5) Activity Tolerance: Patient tolerated treatment well Patient left: in chair;with call bell/phone within reach;with nursing/sitter in room;with family/visitor present Nurse Communication: Mobility status PT Visit Diagnosis: Unsteadiness on feet (R26.81);Muscle weakness (generalized) (M62.81);History of falling (Z91.81)     Time: 3329-5188 PT Time Calculation (min) (ACUTE ONLY): 44 min  Charges:  $Gait Training: 23-37 mins                    Etta Grandchild, PT, DPT Acute Rehabilitation Services Pager: [phone] Office: [phone]     Etta Grandchild 08/17/2018, 5:46 PM

## 2018-08-17 NOTE — Progress Notes (Signed)
eLink Physician-Brief Progress Note Patient Name: Wayne Young DOB: 04-19-1956 MRN: [id number]   Date of Service  08/17/2018  HPI/Events of Note  Notified of slight swelling on the neck, not progressive.  No crepitus on exam. CXR this morning without abnormality. No issues with ventilation.  eICU Interventions  Will continue to observe for now     Intervention Category Major Interventions: Airway management  Darl Pikes 08/17/2018, 9:26 PM

## 2018-08-17 NOTE — Progress Notes (Signed)
PULMONARY / CRITICAL CARE MEDICINE   NAME:  Wayne Young, MRN:  [id number], DOB:  09-30-1956, LOS: 23 ADMISSION DATE:  07/25/2018,   CHIEF COMPLAINT: Dyspnea and declining mental status.  BRIEF HISTORY:        62 year old man, history of RA and prior methotrexate, latent TB (treated), months of unexplained weight loss.  He quit smoking 21 years ago.  Admitted with dyspnea and then respiratory failure requiring mechanical ventilation.  Treating for left lower lobe pneumonia.  He failed extubation on 10/9, experienced an episode suspicious for acute seizure and was reintubated.  Empirically on Keppra.  Brain imaging and EEG were both reassuring. Extubated 10/11 He was reintubated 10/13 for sudden respiratory distress and inability to clear secretions, concern for intermittent diplopia and weakness Self extubated and re-intubated on 10/23 empiric IVIG treatment x5 on 10/24 for suspicion of MG/LE  SIGNIFICANT PAST MEDICAL HISTORY    Rheumatoid arthritis, positive PPD.  SIGNIFICANT EVENTS:  10/17 suction bronch, normal anatomy with thick clear mucous on left partially cleared with saline lavage 10/23 self-extubated and re-intubated 10/23-28 IVIG  STUDIES:   CT chest angio 10/9 >> LLL consolidation CT abd  / pelvis  10/7 >> ? SMA syndrome CT neck 10/7 >> neg MRI brain 10/10 >> chronic ischemic microangiopathy  EEG 10/10 neg  MRI T/ C spine 10/13 >>  mild central disc protrusion C4-6 but no cord compression and no demyelinating lesions  10/13 myasthenia gravis labs > Achr binding antibody: 0.03- normal range.  Achr blocking antibody 22 - normal range Achr modulating Ab from 10/14 now neg  10/23: voltage gated calcium channel lab neg musc antibody >> MG serurm >>  NIF: 10/24 -18 10/26 -13 10/27 -15 10/28 -20 10/29 -20  CULTURES:  resp 10/14 >>> enterobacter >> CTX sens  ANTIBIOTICS:  Zosyn 10/7 >> 10/17 CTX  10/17 >>>> 10/21  LINES/TUBES:  ETT 10/7 >> 10/9, 10/9 >>  10/11 ,          10/13 >>10/23          10/23>> 10/24 Trach 10/24>> CONSULTANTS:  Neurology >>  SUBJECTIVE:  Feels he is about same as yesterday.  Thought his breathing and arm/leg muscles did fine during the walk around the room with PT yesterday.  Thinks he can walk farther.  CONSTITUTIONAL: BP 134/77   Pulse 72   Temp 98.2 F (36.8 C) (Oral)   Resp 14   Ht 5\' 11"  (1.803 m)   Wt 58.7 kg   SpO2 92%   BMI 18.05 kg/m   I/O last 3 completed shifts: In: 3210.6 [I.V.:300; Other:35; NG/GT:2480; IV Piggyback:395.6] Out: 1705 [Urine:1705]  Vent Mode: PRVC FiO2 (%):  [40 %] 40 % Set Rate:  [16 bmp] 16 bmp Vt Set:  [600 mL] 600 mL PEEP:  [5 cmH20] 5 cmH20 Pressure Support:  [18 cmH20] 18 cmH20 Plateau Pressure:  [20 cmH20-21 cmH20] 20 cmH20  PHYSICAL EXAM:  Frail, alert black male.  Still coommunicating w/ hand signals/writing S1/2,rrr Right lower lobe with good air movement, scattered rhonchi Soft abdomen, no tenderness  CXR pending  RESOLVED PROBLEM LIST   ASSESSMENT AND PLAN    Acute resp failure  -recurrent, etiology unclear - ?for neuromuscular disease ,  ? recurrent aspiration.  We are treating as potential upper neuron disorder.  Repeated mucous plugging.  Nif on 10/27 (-)15.  Now s/p IVIG x5 days.  Voltage gate calcium channel labs neg -per neuro can take 1wk s/p IVIG for effect (11/3) - f/u  Musc antibody (7-15day turn around ) and MG serum (~14 turn around) pending from 10/23 - No reserve 2/2 deconditioning - Sat goals of > 94% - Chest PT as tolerated - PT/OT (was able to walk in room 10/29) - Swallow eval once reliably off vent - down 8lbs(questionable scale),  up 5L this admission -Daily nifs (-20 10/29) -considering muscle biopsy on chance this is some other etiology  Aspiration gram-negative pneumonia- improved plugging vs consolodation on lower right on CXR 10/28.  WBC 6.2. Afebrile overnight 10/27 -Trend CBC/ fever curve -Monitor off abx for  now -Trend CXR -bag lavage>suction bronch prn for plugging  GI- ?6lb weight loss yesterday, likely scale abnormality Protein calorie malnutrition/weight loss. whole-body imaging negative, no clear etiology apparent, no evidence of active TB Plan - ct tube feeds - cont monitoring K/phos/mag prn  - prn biscodyl  Renal Adequate output, up 5L this admission Plan Replete electrolytes prn Trend BMET (replete K and mag 10/29)  Endocrine- needed 11u short acting since lantus 9 10/29, lowest CBG 126, half now <200 DM Plan CBG's Q 4 SSI Increase lantus to 10 from 9  Eventual Outpatient plan: Neuro: -will need single fiber EMG/RNS testing as outpatient 1-2 weeks on discharge. -Keppra can potentially be discontinued as outpatient.    SUMMARY OF TODAY'S PLAN:   Still with MG serum and musc antibody pending Cont Daily NIF, may take until 11/3 for IVIG to show full effect if this is neuro related Still Considering utility of muscle biopsy  Best Practice / Goals of Care / Disposition.    DVT PROPHYLAXIS: Lovenox SUP: Pepcid Updated wife at bedside daily   LABS  Glucose Recent Labs  Lab 08/16/18 0746 08/16/18 1111 08/16/18 1533 08/16/18 1918 08/16/18 2340 08/17/18 0342  GLUCAP 228* 224* 121* 141* 202* 169*    BMET Recent Labs  Lab 08/15/18 0410 08/16/18 0524 08/17/18 0406  NA 135 135 136  K 3.7 3.4* 3.4*  CL 105 103 104  CO2 26 25 26   BUN 16 17 17   CREATININE 0.52* 0.48* 0.52*  GLUCOSE 271* 189* 207*    Liver Enzymes No results for input(s): AST, ALT, ALKPHOS, BILITOT, ALBUMIN in the last 168 hours.  Electrolytes Recent Labs  Lab 08/14/18 0443 08/15/18 0410 08/16/18 0524 08/17/18 0406  CALCIUM 8.9 8.5* 8.5* 8.6*  MG 1.9 1.8 1.7  --   PHOS 2.3* 2.6 2.8  --     CBC Recent Labs  Lab 08/15/18 0410 08/16/18 0524 08/17/18 0406  WBC 6.2 5.6 5.2  HGB 10.3* 10.2* 10.3*  HCT 32.7* 31.4* 31.4*  PLT 252 261 241    ABG No results for input(s):  PHART, PCO2ART, PO2ART in the last 168 hours.  Coag's Recent Labs  Lab 08/10/18 1626 08/11/18 0427  APTT 31 33  INR 1.36 1.31    Sepsis Markers No results for input(s): LATICACIDVEN, PROCALCITON, O2SATVEN in the last 168 hours.  Cardiac Enzymes No results for input(s): TROPONINI, PROBNP in the last 168 hours.  08/12/18, DO  PGY2 [place] 08/17/2018 7:04 AM

## 2018-08-17 NOTE — Progress Notes (Signed)
SLP Cancellation Note  Patient Details Name: Wayne Young MRN: [id number] DOB: 08-13-1956   Cancelled treatment:       Reason Eval/Treat Not Completed: Medical issues which prohibited therapy (on vent today)   Maxcine Ham 08/17/2018, 1:19 PM  Maxcine Ham, M.A. CCC-SLP Acute Herbalist [phone] Office [phone]

## 2018-08-17 NOTE — Progress Notes (Signed)
NIF -20 VC 1.2L Strong effort on both test

## 2018-08-17 NOTE — Progress Notes (Signed)
Walked PT on vent  PT was stable throughout  walk

## 2018-08-18 ENCOUNTER — Inpatient Hospital Stay (HOSPITAL_COMMUNITY): Payer: Medicaid Other

## 2018-08-18 LAB — CBC
HEMATOCRIT: 33.3 % — AB (ref 39.0–52.0)
Hemoglobin: 10.4 g/dL — ABNORMAL LOW (ref 13.0–17.0)
MCH: 27.4 pg (ref 26.0–34.0)
MCHC: 31.2 g/dL (ref 30.0–36.0)
MCV: 87.9 fL (ref 80.0–100.0)
NRBC: 0 % (ref 0.0–0.2)
Platelets: 245 10*3/uL (ref 150–400)
RBC: 3.79 MIL/uL — ABNORMAL LOW (ref 4.22–5.81)
RDW: 12.7 % (ref 11.5–15.5)
WBC: 5.3 10*3/uL (ref 4.0–10.5)

## 2018-08-18 LAB — BASIC METABOLIC PANEL
Anion gap: 4 — ABNORMAL LOW (ref 5–15)
BUN: 16 mg/dL (ref 8–23)
CALCIUM: 8.4 mg/dL — AB (ref 8.9–10.3)
CO2: 27 mmol/L (ref 22–32)
CREATININE: 0.5 mg/dL — AB (ref 0.61–1.24)
Chloride: 105 mmol/L (ref 98–111)
GFR calc Af Amer: >60 mL/min (ref >60)
GLUCOSE: 190 mg/dL — AB (ref 70–99)
Potassium: 3.3 mmol/L — ABNORMAL LOW (ref 3.5–5.1)
SODIUM: 136 mmol/L (ref 135–145)

## 2018-08-18 LAB — GLUCOSE, CAPILLARY
GLUCOSE-CAPILLARY: 180 mg/dL — AB (ref 70–99)
GLUCOSE-CAPILLARY: 128 mg/dL — AB (ref 70–99)
GLUCOSE-CAPILLARY: 121 mg/dL — AB (ref 70–99)
Glucose-Capillary: 138 mg/dL — ABNORMAL HIGH (ref 70–99)
Glucose-Capillary: 158 mg/dL — ABNORMAL HIGH (ref 70–99)

## 2018-08-18 LAB — PHOSPHORUS: Phosphorus: 3.2 mg/dL (ref 2.5–4.6)

## 2018-08-18 LAB — MAGNESIUM: MAGNESIUM: 1.8 mg/dL (ref 1.7–2.4)

## 2018-08-18 MED ORDER — ETOMIDATE 2 MG/ML IV SOLN
16.0000 mg | Freq: Once | INTRAVENOUS | Status: AC
  Administered 2018-08-18: 16 mg via INTRAVENOUS
  Filled 2018-08-18: qty 10

## 2018-08-18 MED ORDER — ONDANSETRON HCL 4 MG/2ML IJ SOLN
4.0000 mg | Freq: Three times a day (TID) | INTRAMUSCULAR | Status: DC | PRN
  Administered 2018-08-18: 4 mg via INTRAVENOUS
  Filled 2018-08-18: qty 2

## 2018-08-18 MED ORDER — FENTANYL CITRATE (PF) 100 MCG/2ML IJ SOLN
100.0000 ug | Freq: Once | INTRAMUSCULAR | Status: AC
  Administered 2018-08-18: 100 ug via INTRAVENOUS
  Filled 2018-08-18: qty 2

## 2018-08-18 MED ORDER — SCOPOLAMINE 1 MG/3DAYS TD PT72
1.0000 | MEDICATED_PATCH | TRANSDERMAL | Status: DC
  Administered 2018-08-18: 1.5 mg via TRANSDERMAL
  Filled 2018-08-18: qty 1

## 2018-08-18 MED ORDER — POTASSIUM CHLORIDE 20 MEQ PO PACK
40.0000 meq | PACK | Freq: Once | ORAL | Status: AC
  Administered 2018-08-18: 40 meq via ORAL
  Filled 2018-08-18: qty 2

## 2018-08-18 MED ORDER — INFLUENZA VAC SPLIT QUAD 0.5 ML IM SUSY
0.5000 mL | PREFILLED_SYRINGE | INTRAMUSCULAR | Status: DC
  Filled 2018-08-18: qty 0.5

## 2018-08-18 NOTE — Progress Notes (Signed)
PULMONARY / CRITICAL CARE MEDICINE   NAME:  Wayne Young, MRN:  [id number], DOB:  [birth year], LOS: 24 ADMISSION DATE:  07/25/2018,   CHIEF COMPLAINT: Dyspnea and declining mental status.  BRIEF HISTORY:        62 year old man, history of RA and prior methotrexate, latent TB (treated), months of unexplained weight loss.  He quit smoking 21 years ago.  Admitted with dyspnea and then respiratory failure requiring mechanical ventilation.  Treating for left lower lobe pneumonia.  He failed extubation on 10/9, experienced an episode suspicious for acute seizure and was reintubated.  Empirically on Keppra.  Brain imaging and EEG were both reassuring. Extubated 10/11 He was reintubated 10/13 for sudden respiratory distress and inability to clear secretions, concern for intermittent diplopia and weakness Self extubated and re-intubated on 10/23 empiric IVIG treatment x5 on 10/24 for suspicion of MG/LE  SIGNIFICANT PAST MEDICAL HISTORY    Rheumatoid arthritis, positive PPD.  SIGNIFICANT EVENTS:  10/17 suction bronch, normal anatomy with thick clear mucous on left partially cleared with saline lavage 10/23 self-extubated and re-intubated 10/23-28 IVIG  STUDIES:   CT chest angio 10/9 >> LLL consolidation CT abd  / pelvis  10/7 >> ? SMA syndrome CT neck 10/7 >> neg MRI brain 10/10 >> chronic ischemic microangiopathy  EEG 10/10 neg  MRI T/ C spine 10/13 >>  mild central disc protrusion C4-6 but no cord compression and no demyelinating lesions  10/13 myasthenia gravis labs > Achr binding antibody: 0.03- normal range.  Achr blocking antibody 22 - normal range Achr modulating Ab from 10/14 now neg  10/23: voltage gated calcium channel lab neg musc antibody >> MG serurm >>  NIF: 10/24 -18 10/26 -13 10/27 -15 10/28 -20 10/29 -20 10/30 -20  CULTURES:  resp 10/14 >>> enterobacter >> CTX sens  ANTIBIOTICS:  Zosyn 10/7 >> 10/17 CTX  10/17 >>>> 10/21  LINES/TUBES:  ETT 10/7 >> 10/9,  10/9 >> 10/11 ,          10/13 >>10/23          10/23>> 10/24 Trach 10/24>> CONSULTANTS:  Neurology >>  SUBJECTIVE:  Report of left sided neck swelling overnight, examined by e-link overnight 10/30 and not concerning.  Resolved by AM rounds Complaint of nausea.  No feeling of belly fullness/pain/heartburn  CONSTITUTIONAL: BP 139/72   Pulse 70   Temp 98.4 F (36.9 C) (Oral)   Resp 16   Ht 5\' 11"  (1.803 m)   Wt 61.2 kg   SpO2 98%   BMI 18.82 kg/m   I/O last 3 completed shifts: In: 3000.3 [I.V.:341.5; NG/GT:2265; IV Piggyback:393.8] Out: 2180 [Urine:2180]  Vent Mode: PRVC FiO2 (%):  [40 %] (P) 40 % Set Rate:  [16 bmp] 16 bmp Vt Set:  [600 mL] 600 mL PEEP:  [5 cmH20] 5 cmH20 Pressure Support:  [15 cmH20] 15 cmH20 Plateau Pressure:  [17 cmH20-19 cmH20] 19 cmH20  PHYSICAL EXAM:  Frail, alert black male.  Still coommunicating w/ hand signals/writing S1/2  CTAB, good air movement throughout Soft abdomen, no tenderness  CXR shows clearing of right lung from prior plugging  RESOLVED PROBLEM LIST   ASSESSMENT AND PLAN    Acute resp failure  -recurrent, etiology unclear - ?for neuromuscular disease ,  ? recurrent aspiration.  We are treating as potential upper neuron disorder.  Repeated mucous plugging.  Nif on 10/27 (-)15.  Now s/p IVIG x5 days.  Voltage gate calcium channel and Musc. Antibody labs neg -per neuro can take 1wk  s/p IVIG for effect (11/3) - f/u MG serum (~14 turn around) pending from 10/23 - No reserve 2/2 deconditioning - Sat goals of > 94% - Chest PT as tolerated - PT/OT was able to walk ~30' outsdie of room 10/30 - Swallow eval once reliably off vent - down 3lbs,  up 4L this admission -Daily nifs (-20 10/30) -considering muscle biopsy on chance this is some other etiology  Aspiration gram-negative pneumonia- rigth side clear 10/31.  WBC 5.3. Afebrile overnight 10/30 -Trend CBC/ fever curve -Monitor off abx for now -Trend CXR -cont bag  lavage>suction bronch prn for plugging  GI- claimed nausea overnight 10/30 Protein calorie malnutrition/weight loss. whole-body imaging negative, no clear etiology apparent, no evidence of active TB Plan -will add zofran - ct tube feeds after zofran - cont monitoring K/phos/mag prn  - prn biscodyl  Renal Adequate output, up 4L this admission Plan Replete electrolytes prn Trend BMET (replete K 10/30)  Endocrine- needed 13u short acting since lantus 9 10/29, most cbgs now <200 DM Plan CBG's Q 4 SSI Cont lantus 10  Eventual Outpatient plan: Neuro: -will need single fiber EMG/RNS testing as outpatient 1-2 weeks on discharge. -Keppra can potentially be discontinued as outpatient.    SUMMARY OF TODAY'S PLAN:   Still with MG serum and musc antibody pending Cont Daily NIF, may take until 11/3 for IVIG to show full effect if this is neuro related Still Considering utility of muscle biopsy Vent plan is slow wean as tolerated to see if muscular function can be regained  Best Practice / Goals of Care / Disposition.    DVT PROPHYLAXIS: Lovenox SUP: Pepcid Updated wife at bedside daily   LABS  Glucose Recent Labs  Lab 08/17/18 0736 08/17/18 1126 08/17/18 1547 08/17/18 1940 08/17/18 2334 08/18/18 0331  GLUCAP 213* 248* 223* 182* 159* 138*    BMET Recent Labs  Lab 08/16/18 0524 08/17/18 0406 08/18/18 0449  NA 135 136 136  K 3.4* 3.4* 3.3*  CL 103 104 105  CO2 25 26 27   BUN 17 17 16   CREATININE 0.48* 0.52* 0.50*  GLUCOSE 189* 207* 190*    Liver Enzymes No results for input(s): AST, ALT, ALKPHOS, BILITOT, ALBUMIN in the last 168 hours.  Electrolytes Recent Labs  Lab 08/15/18 0410 08/16/18 0524 08/17/18 0406 08/18/18 0449  CALCIUM 8.5* 8.5* 8.6* 8.4*  MG 1.8 1.7  --  1.8  PHOS 2.6 2.8  --  3.2    CBC Recent Labs  Lab 08/16/18 0524 08/17/18 0406 08/18/18 0449  WBC 5.6 5.2 5.3  HGB 10.2* 10.3* 10.4*  HCT 31.4* 31.4* 33.3*  PLT 261 241 245     ABG No results for input(s): PHART, PCO2ART, PO2ART in the last 168 hours.  Coag's No results for input(s): APTT, INR in the last 168 hours.  Sepsis Markers No results for input(s): LATICACIDVEN, PROCALCITON, O2SATVEN in the last 168 hours.  Cardiac Enzymes No results for input(s): TROPONINI, PROBNP in the last 168 hours.  08/19/18, DO  PGY2 [facility] 08/18/2018 7:12 AM

## 2018-08-18 NOTE — Progress Notes (Signed)
Nutrition Follow-up  DOCUMENTATION CODES:   Severe malnutrition in context of acute illness/injury, Underweight  INTERVENTION:   Continue TF via Cortrak tube:  Vital AF 1.2 at 65 ml/h to provide 1560 ml, 1872 kcal, 117 gm protein, 1265 ml free water daily  NUTRITION DIAGNOSIS:   Severe Malnutrition related to (acute vs chronic illness (work-up underway)) as evidenced by moderate fat depletion, severe muscle depletion, moderate muscle depletion, percent weight loss(27% weight loss within 3 months).  Ongoing   GOAL:   Patient will meet greater than or equal to 90% of their needs  Met with TF  MONITOR:   Vent status, TF tolerance, Labs, I & O's  ASSESSMENT:   62 yo male with PMH of DM, rheumatoid arthritis, HTN, positive PPD (completed treatment), and glaucoma who was admitted on 10/7 with SOB and involuntary weight loss. Intubated 10/7, extubated 10/9, but required re-intubation later that day.   Discussed patient in ICU rounds and with RN today. S/P Cortrak placement 10/25. Tolerating TF well except for some nausea this morning; TF was held for a few hours, but has been resumed.  MG serum and musc antibody pending. Receiving IVIG. May need muscle biopsy to determine etiology. Patient remains very deconditioned. Slow vent wean planned to see if muscular function can be regained.  Remains on ventilator support via trach (placed 10/24) MV: 9.8 L/min Temp (24hrs), Avg:98.7 F (37.1 C), Min:98.3 F (36.8 C), Max:99.4 F (37.4 C)   Labs reviewed. Potassium 3.3 (L) CBG's: [phone] Medications reviewed and include Novolog, Lantus.  Weight trend remains fairly stable. I/O + 20.8 L since admission   Diet Order:   Diet Order            Diet NPO time specified  Diet effective midnight              EDUCATION NEEDS:   No education needs have been identified at this time  Skin:  Skin Assessment: Skin Integrity Issues: Skin Integrity Issues:: Stage II, Other  (Comment) Stage II: sacrum Other: MASD to buttocks, groin  Last BM:  10/31 (type 6)  Height:   Ht Readings from Last 1 Encounters:  07/31/18 5' 11"  (1.803 m)    Weight:   Wt Readings from Last 1 Encounters:  08/18/18 61.2 kg    Ideal Body Weight:  78.2 kg  BMI:  Body mass index is 18.82 kg/m.  Estimated Nutritional Needs:   Kcal:  7414  Protein:  90-100 gm  Fluid:  1.8 - 2 L    Molli Barrows, RD, LDN, [place] Pager [phone] After Hours Pager [phone]

## 2018-08-18 NOTE — Progress Notes (Signed)
SLP Cancellation Note  Patient Details Name: Wayne Young MRN: [id number] DOB: 28-May-1956   Cancelled treatment:       Reason Eval/Treat Not Completed: Patient not medically ready. Following for medical readiness.    Yaritzel Stange, Riley Nearing 08/18/2018, 8:04 AM

## 2018-08-18 NOTE — Procedures (Signed)
Bronchoscopy Procedure Note Wayne Young [id number] [birth year]  Procedure: Bronchoscopy Indications: Confirmation of tracheostomy position.  Procedure Details Consent: Risks of procedure as well as the alternatives and risks of each were explained to the (patient/caregiver).  Consent for procedure obtained. Time Out: Verified patient identification, verified procedure, site/side was marked, verified correct patient position, special equipment/implants available, medications/allergies/relevent history reviewed, required imaging and test results available.  Performed  In preparation for procedure, patient was given 100% FiO2 and bronchoscope lubricated. Sedation: Benzodiazepines and Etomidate  Airway entered and the following bronchi were examined: trachea and both mainstem  Bronchi.   Bronchoscope removed.    Distal opening of the tracheostomy tube confirmed in the middle of the lumen of the trachea.        Evaluation Hemodynamic Status: BP stable throughout; O2 sats: transiently fell during during procedure Patient's Current Condition: stable Specimens:  None Complications: No apparent complications Patient did tolerate procedure well.   Wayne Young 08/18/2018

## 2018-08-18 NOTE — Procedures (Signed)
Tracheostomy change at 7days.   Exchanged 6 cuffed Shiley with no complications. Position confirmed by bronchoscopy.  Lynnell Catalan, MD [facility] ICU Physician [facility]  Pager: [phone] Mobile: [phone] After hours: [phone].

## 2018-08-18 NOTE — Progress Notes (Signed)
Swelling noted to left side of trach.  Swelling soft to touch and non tender.  Called elink to advise [facility], Charity fundraiser.  Mercy was able to camera in and visualize.  Mercy advised she would pass along to Dr. Violet Baldy.

## 2018-08-19 ENCOUNTER — Inpatient Hospital Stay (HOSPITAL_COMMUNITY): Payer: Medicaid Other

## 2018-08-19 LAB — CBC
HEMATOCRIT: 33.5 % — AB (ref 39.0–52.0)
HEMOGLOBIN: 10.8 g/dL — AB (ref 13.0–17.0)
MCH: 28.2 pg (ref 26.0–34.0)
MCHC: 32.2 g/dL (ref 30.0–36.0)
MCV: 87.5 fL (ref 80.0–100.0)
PLATELETS: 239 10*3/uL (ref 150–400)
RBC: 3.83 MIL/uL — AB (ref 4.22–5.81)
RDW: 12.7 % (ref 11.5–15.5)
WBC: 7 10*3/uL (ref 4.0–10.5)
nRBC: 0 % (ref 0.0–0.2)

## 2018-08-19 LAB — BASIC METABOLIC PANEL
ANION GAP: 4 — AB (ref 5–15)
BUN: 19 mg/dL (ref 8–23)
CHLORIDE: 103 mmol/L (ref 98–111)
CO2: 27 mmol/L (ref 22–32)
Calcium: 8.6 mg/dL — ABNORMAL LOW (ref 8.9–10.3)
Creatinine, Ser: 0.58 mg/dL — ABNORMAL LOW (ref 0.61–1.24)
GFR calc Af Amer: >60 mL/min (ref >60)
GLUCOSE: 192 mg/dL — AB (ref 70–99)
POTASSIUM: 3.5 mmol/L (ref 3.5–5.1)
Sodium: 134 mmol/L — ABNORMAL LOW (ref 135–145)

## 2018-08-19 LAB — GLUCOSE, CAPILLARY
GLUCOSE-CAPILLARY: 168 mg/dL — AB (ref 70–99)
GLUCOSE-CAPILLARY: 177 mg/dL — AB (ref 70–99)
GLUCOSE-CAPILLARY: 206 mg/dL — AB (ref 70–99)
Glucose-Capillary: 190 mg/dL — ABNORMAL HIGH (ref 70–99)
Glucose-Capillary: 181 mg/dL — ABNORMAL HIGH (ref 70–99)
Glucose-Capillary: 132 mg/dL — ABNORMAL HIGH (ref 70–99)
Glucose-Capillary: 207 mg/dL — ABNORMAL HIGH (ref 70–99)

## 2018-08-19 LAB — MISC LABCORP TEST (SEND OUT): LABCORP TEST CODE: 9985

## 2018-08-19 MED ORDER — DOCUSATE SODIUM 50 MG/5ML PO LIQD
100.0000 mg | Freq: Every day | ORAL | Status: DC
  Administered 2018-08-20: 100 mg
  Filled 2018-08-19: qty 10

## 2018-08-19 MED ORDER — LORAZEPAM 2 MG/ML IJ SOLN
0.5000 mg | Freq: Once | INTRAMUSCULAR | Status: AC
  Administered 2018-08-19: 0.5 mg via INTRAVENOUS

## 2018-08-19 MED ORDER — SCOPOLAMINE 1 MG/3DAYS TD PT72
1.0000 | MEDICATED_PATCH | TRANSDERMAL | Status: DC
  Filled 2018-08-19: qty 1

## 2018-08-19 NOTE — Care Management Note (Signed)
Case Management Note  Patient Details  Name: Wayne Young MRN: [id number] Date of Birth: 1956/05/17  Subjective/Objective:    Pt admitted with SOB, involuntary weight loss   - and declining mental status               Action/Plan:   PTA from home with wife. Pt intubated on arrival and extubated 10/11.  Pt will need PT and nutritional consult when appropriate    Expected Discharge Date:                  Expected Discharge Plan:     In-House Referral:  Clinical Social Work  Discharge planning Services  CM Consult  Post Acute Care Choice:    Choice offered to:     DME Arranged:    DME Agency:     HH Arranged:    HH Agency:     Status of Service:  In process, will continue to follow  If discussed at Long Length of Stay Meetings, dates discussed:    Additional Comments: 08/19/2018  Pt remains appropriate for continued stay - per attending group pt is not yet stable for SNF placement however CSW can began process for vent snf.  CSW actively working on vent snf placement  08/12/18 Pt trached.  CSW to discuss out of state placement possibility with family due to lack of LTACH benefits Cherylann Parr, RN 08/19/2018, 1:05 PM

## 2018-08-19 NOTE — Progress Notes (Addendum)
PULMONARY / CRITICAL CARE MEDICINE   NAME:  Wayne Young, MRN:  [id number], DOB:  07-27-1956, LOS: 25 ADMISSION DATE:  07/25/2018,   CHIEF COMPLAINT: Dyspnea and declining mental status.  BRIEF HISTORY:    62 year old M admitted 10/7 with months of unexplained weight loss, dyspnea and  respiratory failure requiring mechanical ventilation.  He was treated for LLL PNA.  Failed extubation 10/9 with an episode suspicious for acute seizure and was reintubated. Empirically on Keppra. Brain imaging and EEG were both reassuring.  Extubated 10/11but reintubated 10/13 for sudden respiratory distress and inability to clear secretions, concern for intermittent diplopia and weakness. Self extubated and re-intubated on 10/23.  Empiric IVIG treatment x5 on 10/24 for suspicion of MG/LE (although lab negative).  SIGNIFICANT PAST MEDICAL HISTORY   Rheumatoid arthritis - prior treatment with methotrexate, latent TB/positive PPD- treated, former smoker.  SIGNIFICANT EVENTS:  10/07  Admit with weight loss, SOB, resp fx 10/09  Extubated, failed.  Possible seizure, re-intubated  10/11  Extubated 10/13  Reintubated for resp distress, unable to clear secretions 10/17  FOB > normal anatomy, thick clear mucous on left partially cleared with saline lavage 10/23  Self-extubated and re-intubated 10/23-28 IVIG  STUDIES:   CT chest angio 10/9 >> LLL consolidation CT abd  / pelvis  10/7 >> ? SMA syndrome, inhomogenous prostate, left iliopsoas muscle with decreased attenuation: liquified hematoma vs atypical neoplasm  CT neck 10/7 >> neg MRI brain 10/10 >> chronic ischemic microangiopathy EEG 10/10 >> neg MRI T/ C spine 10/13 >>  mild central disc protrusion C4-6 but no cord compression and no demyelinating lesions Myasthenia gravis labs 10/13 >> Achr binding antibody: 0.03- normal range.  Achr blocking antibody 22 - normal range. Achr modulating Ab 10/14 >> neg Voltage gated calcium channel 10/23 >> neg Musc antibody  10/23 >> negative MG L1 serurm 10/23 >>  CULTURES:  BCx2 10/8 >> negative  UC 10/8 >> negative AFB 10/8 >> neg smear  Resp 10/14 >> enterobacter >> S-rocephin  ANTIBIOTICS:  Zosyn 10/7 >> 10/17 Rocephin  10/17 >> 10/21  LINES/TUBES:  ETT 10/7 >> 10/9, 10/9 >> 10/11, 10/13 >>10/23, 10/23 >> 10/24 Trach 10/24 >>  CONSULTANTS:  Neurology >>  SUBJECTIVE:  RN reports pt bordering on diarrhea on bowel regimen.  No acute events overnight.  Afebrile.   CONSTITUTIONAL: BP 128/76   Pulse 73   Temp 98.4 F (36.9 C) (Oral)   Resp 16   Ht 5\' 11"  (1.803 m)   Wt 59.4 kg   SpO2 100%   BMI 18.26 kg/m   I/O last 3 completed shifts: In: 2452.5 [I.V.:370.8; NG/GT:1781.7; IV Piggyback:300] Out: 1300 [Urine:1300]  Vent Mode: PRVC FiO2 (%):  [40 %] 40 % Set Rate:  [16 bmp] 16 bmp Vt Set:  [600 mL] 600 mL PEEP:  [5 cmH20] 5 cmH20 Pressure Support:  [8 cmH20] 8 cmH20 Plateau Pressure:  [17 cmH20-23 cmH20] 17 cmH20  PHYSICAL EXAM:  General: chronically ill appearing male lying in bed on vent   HEENT: MM pink/moist, trach midline c/d/i, small bore feeding tube in place Neuro: Awake, alert, appropriate / communicates by mouthing words CV: s1s2 rrr, no m/r/g PULM: even/non-labored, lungs bilaterally clear  , non-tender, bsx4 active  Extremities: warm/dry, no edema  Skin: no rashes or lesions  RESOLVED PROBLEM LIST    ASSESSMENT AND PLAN    Acute Respiratory Failure with Hypoxia  -recurrent, etiology unclear - ?for neuromuscular disease , ? recurrent aspiration.  We are treating as  potential upper neuron disorder.  Repeated mucous plugging.  Nif -15 to -20.  Now s/p IVIG x5 days.  Voltage gate calcium channel and Musc. Antibody labs negative.  P: Appreciate Neurology > note IVIG effect can take up to one week (11/3).  They request to be called back after one week of completion of IVIG if remains on vent.   Follow pending MG serum  Wean O2 for sats > 90% Chest PT /  pulmonary hygiene  Follow daily NIF  Consider muscle biospy PT / OT efforts  PRVC 8 cc/kg  PSV wean as tolerated   Enterobacter Aspiration Pneumonia  -intermittent bouts of mucus plugging P: Monitor off abx  Follow fever curve / WBC trend  PRN suctioning  Follow CXR intermittently  Mucinex to thin secretions, scopolamine   Left Pleural Effusion  P: Follow radiographically  Consider [place] assessment at bedside 11/1  Nausea  -overnight 10/30  P: PRN zofran   Severe Protein Calorie Malnutrition  -several months of weight loss prior to admit.  Whole body imaging negative, no evidence for active TB. HIV negative 06/2018.  P: TF per Nutrition   Electrolyte Disturbance  -mild hypokalemia/hyponatremia  P: Trend BMP / urinary output Replace electrolytes as indicated Avoid nephrotoxic agents, ensure adequate renal perfusion  DM P: CBG with SSI Q4 Lantus 10 units QD  Eventual Outpatient plan: Neuro: -will need single fiber EMG/RNS testing as outpatient 1-2 weeks on discharge. -Keppra can potentially be discontinued as outpatient.    SUMMARY OF TODAY'S PLAN:    Best Practice / Goals of Care / Disposition.   DVT PROPHYLAXIS: Lovenox SUP: Pepcid FAMILY: No family at bedside am 11/1. Patient updated on plan of care.    LABS  Glucose Recent Labs  Lab 08/18/18 1106 08/18/18 1535 08/18/18 1938 08/18/18 2352 08/19/18 0347 08/19/18 0717  GLUCAP 128* 121* 158* 177* 206* 190*    BMET Recent Labs  Lab 08/17/18 0406 08/18/18 0449 08/19/18 0454  NA 136 136 134*  K 3.4* 3.3* 3.5  CL 104 105 103  CO2 26 27 27   BUN 17 16 19   CREATININE 0.52* 0.50* 0.58*  GLUCOSE 207* 190* 192*    Liver Enzymes No results for input(s): AST, ALT, ALKPHOS, BILITOT, ALBUMIN in the last 168 hours.  Electrolytes Recent Labs  Lab 08/15/18 0410 08/16/18 0524 08/17/18 0406 08/18/18 0449 08/19/18 0454  CALCIUM 8.5* 8.5* 8.6* 8.4* 8.6*  MG 1.8 1.7  --  1.8  --   PHOS 2.6 2.8  --   3.2  --     CBC Recent Labs  Lab 08/17/18 0406 08/18/18 0449 08/19/18 0454  WBC 5.2 5.3 7.0  HGB 10.3* 10.4* 10.8*  HCT 31.4* 33.3* 33.5*  PLT 241 245 239    ABG No results for input(s): PHART, PCO2ART, PO2ART in the last 168 hours.  Coag's No results for input(s): APTT, INR in the last 168 hours.  Sepsis Markers No results for input(s): LATICACIDVEN, PROCALCITON, O2SATVEN in the last 168 hours.  Cardiac Enzymes No results for input(s): TROPONINI, PROBNP in the last 168 hours.   08/20/18, NP-C [facility] Pgr: [phone] or if no answer [phone] 08/19/2018, 10:01 AM

## 2018-08-19 NOTE — Progress Notes (Signed)
Follow up - Critical Care Medicine Note  Patient Details:    Wayne Young is an 62 y.o. male with prolonged ventilator dependent respiratory failure attributed to neuromuscular weakness.  He has a 83-month history of progressive dyspnea with significant weight loss on a background history of rheumatoid arthritis and type 2 diabetes.  He does report some difficulty with swallowing during this time.  Subjective:    Overnight Issues: His condition is generally unchanged.  He has continued to fail attempts at weaning due to dyspnea with associated nausea and episodes of vomiting.  Objective:  Physical Exam:  General: Normotensive, sinus rhythm.  Cachectic man who appears his age. HEENT: Tracheostomy in place.  Fullness has improved.  Small bore feeding tube in good position.  No skin breakdown noted. Respiratory: Chest is clear to auscultation bilaterally.  He probably becomes dyspneic on pressure support ventilation but is comfortable on PRVC. Abdomen: Soft and nontender Neurological: Awake alert with good limb strength. Extremities: Decreased muscle mass.  I performed a diaphragmatic ultrasound which showed decreased diaphragmatic excursion but with relatively preserved thickness.  Assessment/Plan:   Subacute respiratory failure due to neuromuscular weakness.  Minimal parenchymal lung disease.  Diaphragmatic weakness out of proportion to the degree of limb weakness.  Ultrasound suggest diaphragmatic paralysis.  Have discussed the case again with Dr. Amada Jupiter from neurology who is concerned the patient may have a variant of ALS.  Patient may benefit from transfer to tertiary care center to expedite further investigations into his neuromuscular status.  This will be arranged beginning tomorrow.  35 minutes of total time was spent with this patient including greater than 50% of time in counseling and coordination of care  Lynnell Catalan, MD [facility] ICU Physician [facility] [place] [facility]  Pager: [phone] Mobile: [phone] After hours: [phone].

## 2018-08-19 NOTE — NC FL2 (Signed)
[place] LEVEL OF CARE SCREENING TOOL     IDENTIFICATION  Patient Name: Wayne Young Birthdate: [birth year] Sex: male Admission Date (Current Location): 07/25/2018  [place] and [place] Number:  Wayne Young [id number] [place] and Address:  The . [facility], [place] [address], [place], [place] [address]      Provider Number: [id number]  Attending Physician Name and Address:  Wayne Doom, MD  Relative Name and Phone Number:  Wayne Young, wife; [phone]    Current Level of Care: Hospital Recommended Level of Care: Vent SNF, [place] Prior Approval Number:    Date Approved/Denied:   PASRR Number: [id number](Eff. 08/19/18)  Discharge Plan: Other (Comment)(Vent SNF)    Current Diagnoses: Patient Active Problem List   Diagnosis Date Noted  . Consolidation lung ([place])   . Acute respiratory distress   . Encounter for imaging study to confirm orogastric (OG) tube placement   . Self extubation   . Aspiration into airway   . Pressure injury of skin 08/04/2018  . Atelectasis of left lung   . Protein-calorie malnutrition, severe 07/28/2018  . Recent unintentional weight loss over several months   . Acute respiratory failure ([place]) 07/25/2018  . 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 Rheumatoid arthritis with positive rheumatoid factor ([place]) 07/13/2018  . Incidental pulmonary nodule, > 26m and < 874m09/25/2019  . 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 Weight loss 07/13/2018    Orientation RESPIRATION BLADDER Height & Weight     Self, Place, Time, Situation(Unknown)  Vent, Tracheostomy(Vent settings 40 Fi02 and Trach placed 10/31-[place] 6 mm cuffed) Indwelling catheter Weight: 130 lb 15.3 oz (59.4 kg) Height:  5' 11"  (180.3 cm)  BEHAVIORAL SYMPTOMS/MOOD NEUROLOGICAL BOWEL NUTRITION STATUS      Continent Feeding tube(Nasoenteric Feeding  Tube - Cortrak)  AMBULATORY STATUS COMMUNICATION OF NEEDS Skin   Limited Assist Verbally Other (Comment)(Stage 2 pressure injury to medial sacrum with dressing; MASD to bilateral buttocks, Skin tear to chest with foam dressing)                       Personal Care Assistance Level of Assistance  Bathing, Feeding, Dressing Bathing Assistance: Limited assistance Feeding assistance: Maximum assistance(Has Cortrak feeding tube - right nare) Dressing Assistance: Limited assistance     Functional Limitations Info  Sight, Hearing, Speech Sight Info: Impaired(Has Glaucoma) Hearing Info: Adequate Speech Info: Impaired(Has Trach and feeding tube)    SPECIAL CARE FACTORS FREQUENCY  PT (By licensed PT), OT (By licensed OT)     PT Frequency: PT Eval and Treat. PT working with patient a minimum of 3X per week in acute hospital setting OT Frequency: OT Eval anf Treat. OT working with patient a minimum of 2X per week in acute hospital setting            Contractures Contractures Info: Not present    Additional Factors Info  Code Status, Allergies, Suctioning Needs Code Status Info: Full Allergies Info: No known allergies       Suctioning Needs: Patient is suctioned via Tracheal Catheter. As of 11/1, has tolerated suctioning well.   Current Medications (08/19/2018):  This is the current hospital active medication list Current Facility-Administered Medications  Medication Dose Route Frequency Provider Last Rate Last Dose  . 0.9 %  sodium chloride infusion   Intravenous Continuous Regalado, Belkys A, MD 10 mL/hr at 08/19/18 1200    .  acetaminophen (TYLENOL) suppository 650 mg  650 mg Rectal Q4H PRN Judd Lien, MD   650 mg at 08/12/18 0451  . acetaminophen (TYLENOL) tablet 650 mg  650 mg Per Tube Q6H PRN Rigoberto Noel, MD   650 mg at 08/13/18 2127  . albuterol (PROVENTIL) (2.5 MG/3ML) 0.083% nebulizer solution 2.5 mg  2.5 mg Nebulization Q2H PRN Regalado, Belkys A, MD   2.5 mg at  08/11/18 0338  . amLODipine (NORVASC) tablet 10 mg  10 mg Per Tube Daily Regalado, Belkys A, MD   10 mg at 08/19/18 0940  . brimonidine (ALPHAGAN) 0.2 % ophthalmic solution 2 drop  2 drop Both Eyes TID Regalado, Belkys A, MD   2 drop at 08/19/18 1738  . chlorhexidine gluconate (MEDLINE KIT) (PERIDEX) 0.12 % solution 15 mL  15 mL Mouth Rinse BID Collene Gobble, MD   15 mL at 08/19/18 0812  . Chlorhexidine Gluconate Cloth 2 % PADS 6 each  6 each Topical Daily Collene Gobble, MD   6 each at 08/19/18 [phone]  . [START ON 08/20/2018] docusate (COLACE) 50 MG/5ML liquid 100 mg  100 mg Per Tube Daily Ollis, Brandi L, NP      . dorzolamide-timolol (COSOPT) 22.3-6.8 MG/ML ophthalmic solution 2 drop  2 drop Both Eyes BID Regalado, Belkys A, MD   2 drop at 08/19/18 0940  . enoxaparin (LOVENOX) injection 40 mg  40 mg Subcutaneous Q24H Simonne Maffucci B, MD   40 mg at 08/18/18 1955  . famotidine (PEPCID) 40 MG/5ML suspension 20 mg  20 mg Per Tube BID Regalado, Belkys A, MD   20 mg at 08/19/18 0902  . feeding supplement (VITAL AF 1.2 CAL) liquid 1,000 mL  1,000 mL Per Tube Continuous Rigoberto Noel, MD 65 mL/hr at 08/18/18 1541 1,000 mL at 08/18/18 1541  . fentaNYL (SUBLIMAZE) injection 25-100 mcg  25-100 mcg Intravenous Q2H PRN Rigoberto Noel, MD   100 mcg at 08/18/18 1131  . Gerhardt's butt cream   Topical PRN Sampson Goon, MD      . guaiFENesin (ROBITUSSIN) 100 MG/5ML solution 200 mg  10 mL Per Tube Q4H Simonne Maffucci B, MD   200 mg at 08/19/18 1734  . Influenza vac split quadrivalent PF (FLUARIX) injection 0.5 mL  0.5 mL Intramuscular Tomorrow-1000 McQuaid, Douglas B, MD      . insulin aspart (novoLOG) injection 0-9 Units  0-9 Units Subcutaneous Q4H Anders Simmonds, MD   1 Units at 08/19/18 1732  . insulin glargine (LANTUS) injection 10 Units  10 Units Subcutaneous Daily Sherene Sires, DO   10 Units at 08/19/18 0902  . labetalol (NORMODYNE,TRANDATE) injection 10 mg  10 mg Intravenous Q6H PRN Regalado, Belkys  A, MD   10 mg at 08/13/18 1814  . levalbuterol (XOPENEX) nebulizer solution 1.25 mg  1.25 mg Nebulization Q6H Bland, Scott, DO   1.25 mg at 08/19/18 1329  . levETIRAcetam (KEPPRA) IVPB 500 mg/100 mL premix  500 mg Intravenous Q12H Aroor, Karena Addison R, MD 400 mL/hr at 08/19/18 1439 500 mg at 08/19/18 1439  . LORazepam (ATIVAN) injection 0.5 mg  0.5 mg Intravenous Q4H PRN Sampson Goon, MD   0.5 mg at 08/19/18 1421  . MEDLINE mouth rinse  15 mL Mouth Rinse 10 times per day Collene Gobble, MD   15 mL at 08/19/18 1741  . ondansetron (ZOFRAN) injection 4 mg  4 mg Intravenous Q8H PRN Sherene Sires, DO   4 mg at 08/18/18  9396  . scopolamine (TRANSDERM-SCOP) 1 MG/3DAYS 1.5 mg  1 patch Transdermal Q72H Ollis, Brandi L, NP      . sennosides (SENOKOT) 8.8 MG/5ML syrup 10 mL  10 mL Oral Q12H PRN Magdalen Spatz, NP   10 mL at 08/09/18 1023  . sodium chloride flush (NS) 0.9 % injection 10-40 mL  10-40 mL Intracatheter Q12H Collene Gobble, MD   10 mL at 08/19/18 0950  . sodium chloride flush (NS) 0.9 % injection 10-40 mL  10-40 mL Intracatheter PRN Collene Gobble, MD   10 mL at 08/11/18 2223  . sodium chloride HYPERTONIC 3 % nebulizer solution 4 mL  4 mL Nebulization BID Simonne Maffucci B, MD   4 mL at 08/19/18 0815     Discharge Medications: Please see discharge summary for a list of discharge medications.  Relevant Imaging Results:  Relevant Lab Results:   Additional Information ss#[id number]  Sable Feil, LCSW

## 2018-08-19 NOTE — Progress Notes (Addendum)
Subjective: Was recalled because patient has not been improving.  He has very little movement of his diaphragm on bedside ultrasound.  He reports progressive back weakness and shortness of breath since the end of June.  Though he denied difficulty swallowing, his wife states that he did seem like he was getting tube.  Exam: Vitals:   08/19/18 1500 08/19/18 1649  BP:  (!) 151/86  Pulse:  (!) 123  Resp:  (!) 26  Temp: 100.3 F (37.9 C)   SpO2:  90%   Gen: In bed, NAD Resp: non-labored breathing, no acute distress Abd: soft, nt  Neuro: MS:, Alert, answering questions readily with writing CN: Pupils equal round reactive, EOMI, no tongue fasciculations or atrophy Motor: He has good 5/5 strength to confrontation peripherally, he does have some fasciculations of his quadriceps bilaterally Sensory: Decreased distally light touch DTR: Absent ankle reflexes, he has 2+ reflexes at the biceps and patella.  He may be slightly brisk in the biceps, but this is borderline.  He does not have a brisk jaw jerk or pectoral reflexes.  Pertinent Labs: Voltage-gated calcium channel antibodies negative [place] antibodies negative A CHR antibodies negative B1 normal  Impression: 62 year old male with progressive isolated back weakness and diaphragmatic weakness.  Though he is not clearly hyperreflexic, I wonder if some degree of hyperreflexia is masked by diabetic neuropathy.    With this time course and this degree of respiratory dysfunction I think that ALS has to be considered.  Without any clearly pathological reflexes, or access to EMG, and I think I can make this diagnosis reliably at this time.  Diabetic amyotrophy of the phrenic nerves would be significant consideration as well.  This would not explain his quadriceps fasciculations, but this could be due to other diabetic neuropathy.  On these disease I think is less likely given the patient's age and lack of limb-girdle distribution weakness.  An  EMG could be helpful to look for myotonic discharges for this.  Recommendations: 1) an EMG would be very helpful 2) I think that a fluoroscopic swallowing evaluation to look for other clear bulbar dysfunction could be helpful.   3) I do not have any other clear therapeutic options to offer, if a clear diagnosis were desired, then I would consider transfer to a tertiary care center.  Alternatively could use personal ventilatory support to obtain an EMG as an outpatient.  Ritta Slot, MD [facility] [phone]  If 7pm- 7am, please page neurology on call as listed in AMION.

## 2018-08-20 ENCOUNTER — Inpatient Hospital Stay (HOSPITAL_COMMUNITY): Payer: Medicaid Other

## 2018-08-20 LAB — MAGNESIUM: Magnesium: 1.9 mg/dL (ref 1.7–2.4)

## 2018-08-20 LAB — GLUCOSE, CAPILLARY
GLUCOSE-CAPILLARY: 166 mg/dL — AB (ref 70–99)
GLUCOSE-CAPILLARY: 194 mg/dL — AB (ref 70–99)
GLUCOSE-CAPILLARY: 213 mg/dL — AB (ref 70–99)
GLUCOSE-CAPILLARY: 164 mg/dL — AB (ref 70–99)
GLUCOSE-CAPILLARY: 159 mg/dL — AB (ref 70–99)

## 2018-08-20 LAB — BASIC METABOLIC PANEL
Anion gap: 6 (ref 5–15)
BUN: 15 mg/dL (ref 8–23)
CHLORIDE: 104 mmol/L (ref 98–111)
CO2: 25 mmol/L (ref 22–32)
CREATININE: 0.55 mg/dL — AB (ref 0.61–1.24)
Calcium: 8.5 mg/dL — ABNORMAL LOW (ref 8.9–10.3)
GFR calc Af Amer: >60 mL/min (ref >60)
GFR calc non Af Amer: >60 mL/min (ref >60)
Glucose, Bld: 191 mg/dL — ABNORMAL HIGH (ref 70–99)
POTASSIUM: 3.1 mmol/L — AB (ref 3.5–5.1)
Sodium: 135 mmol/L (ref 135–145)

## 2018-08-20 LAB — CBC
HEMATOCRIT: 32.2 % — AB (ref 39.0–52.0)
HEMOGLOBIN: 10.4 g/dL — AB (ref 13.0–17.0)
MCH: 28.1 pg (ref 26.0–34.0)
MCHC: 32.3 g/dL (ref 30.0–36.0)
MCV: 87 fL (ref 80.0–100.0)
Platelets: 204 10*3/uL (ref 150–400)
RBC: 3.7 MIL/uL — AB (ref 4.22–5.81)
RDW: 12.7 % (ref 11.5–15.5)
WBC: 7.1 10*3/uL (ref 4.0–10.5)
nRBC: 0 % (ref 0.0–0.2)

## 2018-08-20 LAB — PREALBUMIN: PREALBUMIN: 13.7 mg/dL — AB (ref 18–38)

## 2018-08-20 LAB — PHOSPHORUS: PHOSPHORUS: 3 mg/dL (ref 2.5–4.6)

## 2018-08-20 MED ORDER — SCOPOLAMINE 1 MG/3DAYS TD PT72: 1.0000 | MEDICATED_PATCH | TRANSDERMAL | 12 refills | Status: DC

## 2018-08-20 MED ORDER — ACETAMINOPHEN 325 MG PO TABS: 650.0000 mg | ORAL_TABLET | Freq: Four times a day (QID) | ORAL | Status: AC | PRN

## 2018-08-20 MED ORDER — CHLORHEXIDINE GLUCONATE 0.12% ORAL RINSE (MEDLINE KIT): 15.0000 mL | Freq: Two times a day (BID) | OROMUCOSAL | 0 refills | Status: DC

## 2018-08-20 MED ORDER — AMLODIPINE BESYLATE 10 MG PO TABS: 10.0000 mg | ORAL_TABLET | Freq: Every day | ORAL | Status: DC

## 2018-08-20 MED ORDER — LEVALBUTEROL HCL 1.25 MG/0.5ML IN NEBU: 1.2500 mg | INHALATION_SOLUTION | Freq: Four times a day (QID) | RESPIRATORY_TRACT | 12 refills | Status: DC

## 2018-08-20 MED ORDER — ALBUTEROL SULFATE (2.5 MG/3ML) 0.083% IN NEBU: 2.5000 mg | INHALATION_SOLUTION | RESPIRATORY_TRACT | 12 refills | Status: AC | PRN

## 2018-08-20 MED ORDER — FENTANYL CITRATE (PF) 100 MCG/2ML IJ SOLN: 25.0000 ug | INTRAMUSCULAR | 0 refills | Status: DC | PRN

## 2018-08-20 MED ORDER — INFLUENZA VAC SPLIT QUAD 0.5 ML IM SUSY: 0.5000 mL | PREFILLED_SYRINGE | INTRAMUSCULAR | 0 refills | Status: AC

## 2018-08-20 MED ORDER — LORAZEPAM 2 MG/ML IJ SOLN: 0.5000 mg | INTRAMUSCULAR | 0 refills | Status: DC | PRN

## 2018-08-20 MED ORDER — INSULIN ASPART 100 UNIT/ML ~~LOC~~ SOLN: 0.0000 [IU] | SUBCUTANEOUS | 11 refills | Status: DC

## 2018-08-20 MED ORDER — GERHARDT'S BUTT CREAM: 1.0000 "application " | TOPICAL_CREAM | CUTANEOUS | Status: DC | PRN

## 2018-08-20 MED ORDER — ORAL CARE MOUTH RINSE: 15.0000 mL | OROMUCOSAL | 0 refills | Status: DC

## 2018-08-20 MED ORDER — INSULIN GLARGINE 100 UNIT/ML ~~LOC~~ SOLN: 10.0000 [IU] | Freq: Every day | SUBCUTANEOUS | 11 refills | Status: DC

## 2018-08-20 MED ORDER — SODIUM CHLORIDE 0.9% FLUSH: 10.0000 mL | INTRAVENOUS | Status: DC | PRN

## 2018-08-20 MED ORDER — BRIMONIDINE TARTRATE 0.2 % OP SOLN: 2.0000 [drp] | Freq: Three times a day (TID) | OPHTHALMIC | 12 refills | Status: DC

## 2018-08-20 MED ORDER — SODIUM CHLORIDE 0.9% FLUSH: 10.0000 mL | Freq: Two times a day (BID) | INTRAVENOUS | Status: AC

## 2018-08-20 MED ORDER — VITAL AF 1.2 CAL PO LIQD: 1000.0000 mL | ORAL | Status: DC

## 2018-08-20 MED ORDER — LEVETIRACETAM IN NACL 500 MG/100ML IV SOLN: 500.0000 mg | Freq: Two times a day (BID) | INTRAVENOUS | Status: DC

## 2018-08-20 MED ORDER — GUAIFENESIN 100 MG/5ML PO SOLN: 10.0000 mL | ORAL | 0 refills | Status: DC

## 2018-08-20 MED ORDER — DOCUSATE SODIUM 50 MG/5ML PO LIQD: 100.0000 mg | Freq: Every day | ORAL | 0 refills | Status: DC

## 2018-08-20 MED ORDER — LABETALOL HCL 5 MG/ML IV SOLN: 10.0000 mg | Freq: Four times a day (QID) | INTRAVENOUS | Status: DC | PRN

## 2018-08-20 MED ORDER — SODIUM CHLORIDE 0.9 % IV SOLN: 10.0000 mL | INTRAVENOUS | 0 refills | Status: DC

## 2018-08-20 MED ORDER — ENOXAPARIN SODIUM 40 MG/0.4ML ~~LOC~~ SOLN: 40.0000 mg | SUBCUTANEOUS | Status: DC

## 2018-08-20 MED ORDER — ONDANSETRON HCL 4 MG/2ML IJ SOLN: 4.0000 mg | Freq: Three times a day (TID) | INTRAMUSCULAR | 0 refills | Status: DC | PRN

## 2018-08-20 MED ORDER — SENNOSIDES 8.8 MG/5ML PO SYRP: 10.0000 mL | ORAL_SOLUTION | Freq: Two times a day (BID) | ORAL | 0 refills | Status: DC | PRN

## 2018-08-20 MED ORDER — CHLORHEXIDINE GLUCONATE CLOTH 2 % EX PADS: 6.0000 | MEDICATED_PAD | Freq: Every day | CUTANEOUS | Status: DC

## 2018-08-20 MED ORDER — SODIUM CHLORIDE 3 % IN NEBU: 4.0000 mL | INHALATION_SOLUTION | Freq: Two times a day (BID) | RESPIRATORY_TRACT | 12 refills | Status: DC

## 2018-08-20 MED ORDER — FAMOTIDINE 40 MG/5ML PO SUSR: 20.0000 mg | Freq: Two times a day (BID) | ORAL | 0 refills | Status: AC

## 2018-08-20 NOTE — Progress Notes (Signed)
[facility] here to pickup patient. All information given to [facility]. Patient's belongings sent home with his family.

## 2018-08-20 NOTE — Discharge Summary (Signed)
Physician Discharge Summary  Patient ID: Wayne Young MRN: [id number] DOB/AGE: [birth year] 64 y.o.  Admit date: 07/25/2018 Discharge date: 08/20/2018    Discharge Diagnoses:  Subacute Respiratory Failure secondary to neuromuscular weakness s/p Tracheostomy (10/24) Diaphragmatic Paralysis                                                 Enterobacter Aspiration Pneumonia  Severe Protein Calorie Malnutrition  Hypokalemia/Hyponateremia  DM                  DISCHARGE PLAN BY DIAGNOSIS    Subacute Respiratory Failure secondary to neuromuscular weakness s/p Tracheostomy (10/24) Discharge Plan: Continue Full Vent Support (PRVC 8cc/kg) (PRVC 40/5/16/600) Daily NIF  Diaphragmatic Paralysis   -Ultrasound suggest diaphragmatic paralysis Neuromuscular Weakness     Discharge Plan:                                       -Further testing per Neurology   Enterobacter Aspiration Pneumonia  Thick Secretions  Discharge Plan: -Completed Zosyn 10 days (10/7 > 10/17) Rocephin 4 days (10/17-10/21) -Trend WBC and Fever Curve -Scopolamine  -Currently on Day 8 of Hyptertonic Saline BID  Severe Protein Calorie Malnutrition  Discharge Plan: -Currently Cortrack with TF -Continue to Follow with ST  Hypokalemia/Hyponateremia  Discharge Plan: -Trend BMP  -Replace electrolytes as indicated   DM     Discharge Plan: -Trend Glucose -Continue Lantus and SSI                    DISCHARGE SUMMARY  62 year old M admitted 10/7 with months of unexplained weight loss, dyspnea and  respiratory failure requiring mechanical ventilation. He was treated for LLL PNA.  Failed extubation 10/9 with an episode suspicious for acute seizure and was reintubated. Empirically on Keppra. Brain imaging and EEG were both reassuring.  Extubated 10/11but reintubated 10/13 for sudden respiratory distress and inability to clear secretions, concern for intermittent diplopia and weakness. Self extubated and re-intubated on 10/23.   Empiric IVIG treatment x5 on 10/24 for suspicion of MG/LE (although lab negative). 11/2 decision made to transfer patient to [facility].   10/07  Admit with weight loss, SOB, resp fx 10/09  Extubated, failed.  Possible seizure, re-intubated  10/11  Extubated 10/13  Reintubated for resp distress, unable to clear secretions 10/17  FOB > normal anatomy, thick clear mucous on left partially cleared with saline lavage 10/23  Self-extubated and re-intubated 10/23-28 IVIG 10/24 Tracheostomy            SIGNIFICANT DIAGNOSTIC STUDIES CT chest angio 10/9 > LLL consolidation CT abd  / pelvis  10/7 > ? SMA syndrome, inhomogenous prostate, left iliopsoas muscle with decreased attenuation: liquified hematoma vs atypical neoplasm  CT neck 10/7 > neg MRI brain 10/10 > chronic ischemic microangiopathy EEG 10/10 > neg MRI T/ C spine 10/13 >  mild central disc protrusion C4-6 but no cord compression and no demyelinating lesions Myasthenia gravis labs 10/13 > Achr binding antibody: 0.03- normal range.  Achr blocking antibody 22 - normal range. Achr modulating Ab 10/14 >> neg Voltage gated calcium channel 10/23 >> neg Musc antibody 10/23 > negative MG L1 serurm 10/23 > negative   MICRO DATA  BCx2 10/8 > negative  UC 10/8 > negative AFB 10/8 > neg smear  Resp 10/14 > enterobacter >> S-rocephin  ANTIBIOTICS Zosyn 10/7 > 10/17 Rocephin  10/17 > 10/21  Discharge Exam: General: Adult male, no distress  Neuro: Alert, oriented, Follows commands  CV: RRR, no MRG PULM: Clear breath sounds, on vent, Trach in place  GI: Cortrack in place, soft, non-distended  Extremities: -edema   Vitals:   08/20/18 1952 08/20/18 2000 08/20/18 2100 08/20/18 2200  BP:  92/75 114/65 112/66  Pulse: 79 84 74 72  Resp: 16 (!) 22 16 13   Temp:    99.9 F (37.7 C)  TempSrc:    Oral  SpO2: 100% 98% 99% 98%  Weight:      Height:         Discharge Labs  BMET Recent Labs  Lab 08/14/18 0443  08/15/18 0410 08/16/18 0524 08/17/18 0406 08/18/18 0449 08/19/18 0454 08/20/18 0409  NA 138 135 135 136 136 134* 135  K 3.2* 3.7 3.4* 3.4* 3.3* 3.5 3.1*  CL 101 105 103 104 105 103 104  CO2 26 26 25 26 27 27 25   GLUCOSE 256* 271* 189* 207* 190* 192* 191*  BUN 13 16 17 17 16 19 15   CREATININE 0.47* 0.52* 0.48* 0.52* 0.50* 0.58* 0.55*  CALCIUM 8.9 8.5* 8.5* 8.6* 8.4* 8.6* 8.5*  MG 1.9 1.8 1.7  --  1.8  --  1.9  PHOS 2.3* 2.6 2.8  --  3.2  --  3.0    CBC Recent Labs  Lab 08/18/18 0449 08/19/18 0454 08/20/18 0409  HGB 10.4* 10.8* 10.4*  HCT 33.3* 33.5* 32.2*  WBC 5.3 7.0 7.1  PLT 245 239 204    Anti-Coagulation No results for input(s): INR in the last 168 hours.        Allergies as of 08/20/2018   No Known Allergies     Medication List    STOP taking these medications   atorvastatin 20 MG tablet Commonly known as:  LIPITOR   dorzolamide-timolol 22.3-6.8 MG/ML ophthalmic solution Commonly known as:  COSOPT   folic acid 1 MG tablet Commonly known as:  FOLVITE   HUMULIN R 100 units/mL injection Generic drug:  insulin regular   hydrochlorothiazide 25 MG tablet Commonly known as:  HYDRODIURIL   insulin NPH Human 100 UNIT/ML injection Commonly known as:  HUMULIN N,NOVOLIN N   metFORMIN 850 MG tablet Commonly known as:  GLUCOPHAGE   methotrexate 2.5 MG tablet Commonly known as:  RHEUMATREX   multivitamin tablet   pantoprazole 40 MG tablet Commonly known as:  PROTONIX     TAKE these medications   acetaminophen 325 MG tablet Commonly known as:  TYLENOL Place 2 tablets (650 mg total) into feeding tube every 6 (six) hours as needed for mild pain, fever or headache.   albuterol (2.5 MG/3ML) 0.083% nebulizer solution Commonly known as:  PROVENTIL Take 3 mLs (2.5 mg total) by nebulization every 2 (two) hours as needed for wheezing or shortness of breath.   amLODipine 10 MG tablet Commonly known as:  NORVASC Place 1 tablet (10 mg total) into feeding  tube daily. Start taking on:  08/21/2018 What changed:  how to take this   brimonidine 0.2 % ophthalmic solution Commonly known as:  ALPHAGAN Place 2 drops into both eyes 3 (three) times daily. Start taking on:  08/21/2018   chlorhexidine gluconate (MEDLINE KIT) 0.12 % solution Commonly known as:  PERIDEX 15 mLs by Mouth Rinse route 2 (two) times daily. Start  taking on:  08/21/2018   Chlorhexidine Gluconate Cloth 2 % Pads Apply 6 each topically daily. Start taking on:  08/21/2018   docusate 50 MG/5ML liquid Commonly known as:  COLACE Place 10 mLs (100 mg total) into feeding tube daily. Start taking on:  08/21/2018   enoxaparin 40 MG/0.4ML injection Commonly known as:  LOVENOX Inject 0.4 mLs (40 mg total) into the skin daily. Start taking on:  08/21/2018   famotidine 40 MG/5ML suspension Commonly known as:  PEPCID Place 2.5 mLs (20 mg total) into feeding tube 2 (two) times daily. Start taking on:  08/21/2018   feeding supplement (VITAL AF 1.2 CAL) Liqd Place 1,000 mLs into feeding tube continuous.   fentaNYL 100 MCG/2ML injection Commonly known as:  SUBLIMAZE Inject 0.5-2 mLs (25-100 mcg total) into the vein every 2 (two) hours as needed for severe pain.   Gerhardt's butt cream Crea Apply 1 application topically as needed for irritation (apply to buttocks, groin, areas of MASD).   guaiFENesin 100 MG/5ML Soln Commonly known as:  ROBITUSSIN Place 10 mLs (200 mg total) into feeding tube every 4 (four) hours. Start taking on:  08/21/2018   Influenza vac split quadrivalent PF 0.5 ML injection Commonly known as:  FLUARIX Inject 0.5 mLs into the muscle tomorrow at 10 am for 1 dose.   insulin aspart 100 UNIT/ML injection Commonly known as:  novoLOG Inject 0-9 Units into the skin every 4 (four) hours. Start taking on:  08/21/2018   insulin glargine 100 UNIT/ML injection Commonly known as:  LANTUS Inject 0.1 mLs (10 Units total) into the skin daily. Start taking on:   08/21/2018   labetalol 5 MG/ML injection Commonly known as:  NORMODYNE,TRANDATE Inject 2 mLs (10 mg total) into the vein every 6 (six) hours as needed (systolic greater than 416).   levalbuterol 1.25 MG/0.5ML nebulizer solution Commonly known as:  XOPENEX Take 1.25 mg by nebulization every 6 (six) hours. Start taking on:  08/21/2018   levETIRAcetam 500 MG/100ML Soln Commonly known as:  KEPRRA Inject 100 mLs (500 mg total) into the vein every 12 (twelve) hours. Start taking on:  08/21/2018   LORazepam 2 MG/ML injection Commonly known as:  ATIVAN Inject 0.25 mLs (0.5 mg total) into the vein every 4 (four) hours as needed for anxiety.   mouth rinse Liqd solution 15 mLs by Mouth Rinse route every 2 (two) hours.   ondansetron 4 MG/2ML Soln injection Commonly known as:  ZOFRAN Inject 2 mLs (4 mg total) into the vein every 8 (eight) hours as needed for nausea or vomiting.   scopolamine 1 MG/3DAYS Commonly known as:  TRANSDERM-SCOP Place 1 patch (1.5 mg total) onto the skin every 3 (three) days. Start taking on:  08/22/2018   sennosides 8.8 MG/5ML syrup Commonly known as:  SENOKOT Take 10 mLs by mouth every 12 (twelve) hours as needed for mild constipation.   sodium chloride 0.9 % infusion Inject 10 mLs into the vein continuous.   sodium chloride flush 0.9 % Soln Commonly known as:  NS 10-40 mLs by Intracatheter route as needed (flush).   sodium chloride flush 0.9 % Soln Commonly known as:  NS 10-40 mLs by Intracatheter route every 12 (twelve) hours. Start taking on:  08/21/2018   sodium chloride HYPERTONIC 3 % nebulizer solution Take 4 mLs by nebulization 2 (two) times daily. Start taking on:  08/21/2018       Disposition: Transfer to [facility]   Time spent on disposition:  45 Minutes.  Signed: Hayden Pedro, AGACNP-BC [facility]  Pgr: [phone]  PCCM Pgr: [phone]

## 2018-08-20 NOTE — Progress Notes (Signed)
Report given to Raiford Noble RN at [facility] called and received no answer at 2000 [facility] reached at 2045, informed this RN that there will be a long wait and to try to call [facility] transport team. Informed that they do not have a critical care truck available tonight. [facility] called back and stated they cannot give a definitive time when they will arrive to get patient.   Madi, Charity fundraiser at [facility] updated

## 2018-08-20 NOTE — Progress Notes (Signed)
Follow up - Critical Care Medicine Note  Patient Details:    Wayne Young is an 62 y.o. male with prolonged ventilator dependent respiratory failure attributed to neuromuscular weakness.  He has a 62-month history of progressive dyspnea with significant weight loss on a background history of rheumatoid arthritis and type 2 diabetes.  He does report some difficulty with swallowing during this time.  Subjective:    Overnight Issues: His condition is generally unchanged.  He has continued to fail attempts at weaning due to dyspnea with associated nausea and episodes of vomiting.  Objective:  Physical Exam:  General: Normotensive, sinus rhythm.  Cachectic man who appears his age. HEENT: Tracheostomy in place.  Fullness has improved.  Small bore feeding tube in good position.  No skin breakdown noted. Respiratory: Chest is clear to auscultation bilaterally.  He probably becomes dyspneic on pressure support ventilation but is comfortable on PRVC. Uses accessory muscles to take larger breaths. Abdomen: Soft and nontender Neurological: Awake alert with good limb strength. Extremities: Decreased muscle mass.  I performed a diaphragmatic ultrasound on 11/1 which showed decreased diaphragmatic excursion but with relatively preserved thickness.   Ancillary testing:   Hypokalemia 3.1, low creatinine compatible with low muscle mass. Stable anemia.  All MG labs are negative  MRI brain and C spine are negative.  Assessment/Plan:   Subacute respiratory failure due to neuromuscular weakness.  Minimal parenchymal lung disease.  Diaphragmatic weakness out of proportion to the degree of limb weakness.  Ultrasound suggest diaphragmatic paralysis. No improvement with IVIG given empirically for MG.  Have discussed the case again with Wayne Young from neurology who is concerned the patient may have a variant of ALS.  Patient may benefit from transfer to tertiary care center to expedite further  investigations into his neuromuscular status.  I have contacted [facility] and the patient has been accepted by Wayne Young. Wayne Young.   35 minutes of total time was spent with this patient including greater than 50% of time in counseling and coordination of care  Wayne Catalan, MD [facility] ICU Physician [facility]  Pager: [phone] Mobile: [phone] After hours: [phone].

## 2018-08-22 MED ORDER — INSULIN GLARGINE 100 UNIT/ML SOLOSTAR PEN
10.00 | PEN_INJECTOR | SUBCUTANEOUS | Status: DC
Start: 2018-08-24 — End: 2018-08-22

## 2018-08-22 MED ORDER — BRIMONIDINE TARTRATE 0.15 % OP SOLN
1.00 | OPHTHALMIC | Status: DC
Start: 2018-08-24 — End: 2018-08-22

## 2018-08-22 MED ORDER — LEVETIRACETAM 500 MG PO TABS
500.00 | ORAL_TABLET | ORAL | Status: DC
Start: 2018-08-23 — End: 2018-08-22

## 2018-08-22 MED ORDER — IPRATROPIUM-ALBUTEROL 0.5-2.5 (3) MG/3ML IN SOLN
3.00 | RESPIRATORY_TRACT | Status: DC
Start: ? — End: 2018-08-22

## 2018-08-22 MED ORDER — DEXTROSE 10 % IV SOLN
125.00 | INTRAVENOUS | Status: DC
Start: ? — End: 2018-08-22

## 2018-08-22 MED ORDER — GENERIC EXTERNAL MEDICATION
Status: DC
Start: 2018-08-23 — End: 2018-08-22

## 2018-08-22 MED ORDER — GENERIC EXTERNAL MEDICATION
Status: DC
Start: ? — End: 2018-08-22

## 2018-08-22 MED ORDER — MUPIROCIN 2 % EX OINT
TOPICAL_OINTMENT | CUTANEOUS | Status: DC
Start: 2018-08-24 — End: 2018-08-22

## 2018-08-22 MED ORDER — THERA PO TABS
1.00 | ORAL_TABLET | ORAL | Status: DC
Start: 2018-08-24 — End: 2018-08-22

## 2018-08-22 MED ORDER — AMLODIPINE BESYLATE 5 MG PO TABS
10.00 | ORAL_TABLET | ORAL | Status: DC
Start: 2018-08-24 — End: 2018-08-22

## 2018-08-22 MED ORDER — CHLORHEXIDINE GLUCONATE 0.12 % MT SOLN
15.00 | OROMUCOSAL | Status: DC
Start: 2018-08-31 — End: 2018-08-22

## 2018-08-22 MED ORDER — CHLORHEXIDINE GLUCONATE 0.12 % MT SOLN
15.00 | OROMUCOSAL | Status: DC
Start: ? — End: 2018-08-22

## 2018-08-22 MED ORDER — THIAMINE HCL 100 MG PO TABS
100.00 | ORAL_TABLET | ORAL | Status: DC
Start: 2018-08-24 — End: 2018-08-22

## 2018-08-22 MED ORDER — INSULIN LISPRO 100 UNIT/ML ~~LOC~~ SOLN
2.00 | SUBCUTANEOUS | Status: DC
Start: 2018-08-23 — End: 2018-08-22

## 2018-08-22 MED ORDER — ENOXAPARIN SODIUM 40 MG/0.4ML ~~LOC~~ SOLN
40.00 | SUBCUTANEOUS | Status: DC
Start: 2018-08-24 — End: 2018-08-22

## 2018-08-22 MED ORDER — GENERIC EXTERNAL MEDICATION
1.00 | Status: DC
Start: 2018-08-22 — End: 2018-08-22

## 2018-08-22 MED ORDER — RANITIDINE HCL 150 MG PO TABS
150.00 | ORAL_TABLET | ORAL | Status: DC
Start: 2018-08-22 — End: 2018-08-22

## 2018-08-22 MED ORDER — GENERIC EXTERNAL MEDICATION
17.00 | Status: DC
Start: ? — End: 2018-08-22

## 2018-08-22 MED ORDER — FOLIC ACID 1 MG PO TABS
1.00 | ORAL_TABLET | ORAL | Status: DC
Start: 2018-08-24 — End: 2018-08-22

## 2018-08-23 MED ORDER — ALPRAZOLAM 0.25 MG PO TABS
0.25 | ORAL_TABLET | ORAL | Status: DC
Start: ? — End: 2018-08-23

## 2018-08-23 MED ORDER — THIAMINE HCL 100 MG PO TABS
100.00 | ORAL_TABLET | ORAL | Status: DC
Start: 2018-09-01 — End: 2018-08-23

## 2018-08-23 MED ORDER — MELATONIN 3 MG PO TABS
6.00 | ORAL_TABLET | ORAL | Status: DC
Start: 2018-08-31 — End: 2018-08-23

## 2018-08-23 MED ORDER — MELATONIN 3 MG PO TABS
6.00 | ORAL_TABLET | ORAL | Status: DC
Start: 2018-08-23 — End: 2018-08-23

## 2018-08-23 MED ORDER — INSULIN LISPRO 100 UNIT/ML ~~LOC~~ SOLN
2.00 | SUBCUTANEOUS | Status: DC
Start: 2018-08-24 — End: 2018-08-23

## 2018-08-23 MED ORDER — AMLODIPINE BESYLATE 5 MG PO TABS
10.00 | ORAL_TABLET | ORAL | Status: DC
Start: 2018-09-01 — End: 2018-08-23

## 2018-08-23 MED ORDER — IPRATROPIUM-ALBUTEROL 0.5-2.5 (3) MG/3ML IN SOLN
3.00 | RESPIRATORY_TRACT | Status: DC
Start: 2018-08-24 — End: 2018-08-23

## 2018-08-23 MED ORDER — GUAIFENESIN ER 600 MG PO TB12
600.00 | ORAL_TABLET | ORAL | Status: DC
Start: 2018-08-24 — End: 2018-08-23

## 2018-08-23 MED ORDER — FOLIC ACID 1 MG PO TABS
1.00 | ORAL_TABLET | ORAL | Status: DC
Start: 2018-09-01 — End: 2018-08-23

## 2018-08-23 MED ORDER — LEVETIRACETAM 500 MG PO TABS
500.00 | ORAL_TABLET | ORAL | Status: DC
Start: 2018-08-24 — End: 2018-08-23

## 2018-08-23 MED ORDER — HYDROCODONE-ACETAMINOPHEN 5-325 MG PO TABS
1.00 | ORAL_TABLET | ORAL | Status: DC
Start: ? — End: 2018-08-23

## 2018-08-23 MED ORDER — GENERIC EXTERNAL MEDICATION
Status: DC
Start: ? — End: 2018-08-23

## 2018-08-23 MED ORDER — GENERIC EXTERNAL MEDICATION
80.00 | Status: DC
Start: ? — End: 2018-08-23

## 2018-08-23 MED ORDER — MAGNESIUM OXIDE 400 MG PO TABS
400.00 | ORAL_TABLET | ORAL | Status: DC
Start: 2018-08-23 — End: 2018-08-23

## 2018-08-23 MED ORDER — GENERIC EXTERNAL MEDICATION
40.00 | Status: DC
Start: 2018-08-31 — End: 2018-08-23

## 2018-08-23 MED ORDER — THERA PO TABS
1.00 | ORAL_TABLET | ORAL | Status: DC
Start: 2018-09-01 — End: 2018-08-23

## 2018-08-31 MED ORDER — BRIMONIDINE TARTRATE 0.2 % OP SOLN
1.00 | OPHTHALMIC | Status: DC
Start: 2018-08-31 — End: 2018-08-31

## 2018-08-31 MED ORDER — INSULIN LISPRO 100 UNIT/ML ~~LOC~~ SOLN
2.00 | SUBCUTANEOUS | Status: DC
Start: 2018-09-01 — End: 2018-08-31

## 2018-08-31 MED ORDER — ACETAMINOPHEN 325 MG PO TABS
650.00 | ORAL_TABLET | ORAL | Status: DC
Start: ? — End: 2018-08-31

## 2018-08-31 MED ORDER — PAROXETINE HCL 10 MG/5ML PO SUSP
10.00 | ORAL | Status: DC
Start: 2018-09-01 — End: 2018-08-31

## 2018-08-31 MED ORDER — GENERIC EXTERNAL MEDICATION
80.00 | Status: DC
Start: 2018-08-31 — End: 2018-08-31

## 2018-08-31 MED ORDER — GUAIFENESIN 100 MG/5ML PO SYRP
200.00 | ORAL_SOLUTION | ORAL | Status: DC
Start: ? — End: 2018-08-31

## 2018-08-31 MED ORDER — ONDANSETRON HCL 4 MG PO TABS
4.00 | ORAL_TABLET | ORAL | Status: DC
Start: ? — End: 2018-08-31

## 2018-08-31 MED ORDER — INSULIN GLARGINE 100 UNIT/ML SOLOSTAR PEN
19.00 | PEN_INJECTOR | SUBCUTANEOUS | Status: DC
Start: 2018-09-01 — End: 2018-08-31

## 2018-08-31 MED ORDER — ENOXAPARIN SODIUM 40 MG/0.4ML ~~LOC~~ SOLN
40.00 | SUBCUTANEOUS | Status: DC
Start: 2018-09-01 — End: 2018-08-31

## 2018-08-31 MED ORDER — HYDROXYZINE HCL 25 MG PO TABS
25.00 | ORAL_TABLET | ORAL | Status: DC
Start: ? — End: 2018-08-31

## 2018-08-31 MED ORDER — ATORVASTATIN CALCIUM 10 MG PO TABS
20.00 | ORAL_TABLET | ORAL | Status: DC
Start: 2018-09-01 — End: 2018-08-31

## 2018-08-31 MED ORDER — IPRATROPIUM-ALBUTEROL 0.5-2.5 (3) MG/3ML IN SOLN
3.00 | RESPIRATORY_TRACT | Status: DC
Start: 2018-08-31 — End: 2018-08-31

## 2018-08-31 MED ORDER — CARBOXYMETHYLCELLULOSE SODIUM 0.5 % OP SOLN
1.00 | OPHTHALMIC | Status: DC
Start: ? — End: 2018-08-31

## 2018-08-31 MED ORDER — IPRATROPIUM-ALBUTEROL 0.5-2.5 (3) MG/3ML IN SOLN
3.00 | RESPIRATORY_TRACT | Status: DC
Start: ? — End: 2018-08-31

## 2018-09-08 LAB — ACID FAST CULTURE WITH REFLEXED SENSITIVITIES

## 2018-09-08 LAB — ACID FAST CULTURE WITH REFLEXED SENSITIVITIES (MYCOBACTERIA): Acid Fast Culture: NEGATIVE

## 2018-09-28 NOTE — Telephone Encounter (Signed)
Patient currently in hospital will re-visit later. Nothing further is needed at this time.

## 2019-07-16 IMAGING — DX DG CHEST 1V PORT
1 series · 1 of 1 positions shown · non-contrast
Comparison: 08/06/2018 and priors

CLINICAL DATA: Left lower lobe pneumonia.  Dyspnea.

EXAM:
PORTABLE CHEST 1 VIEW

[chest]
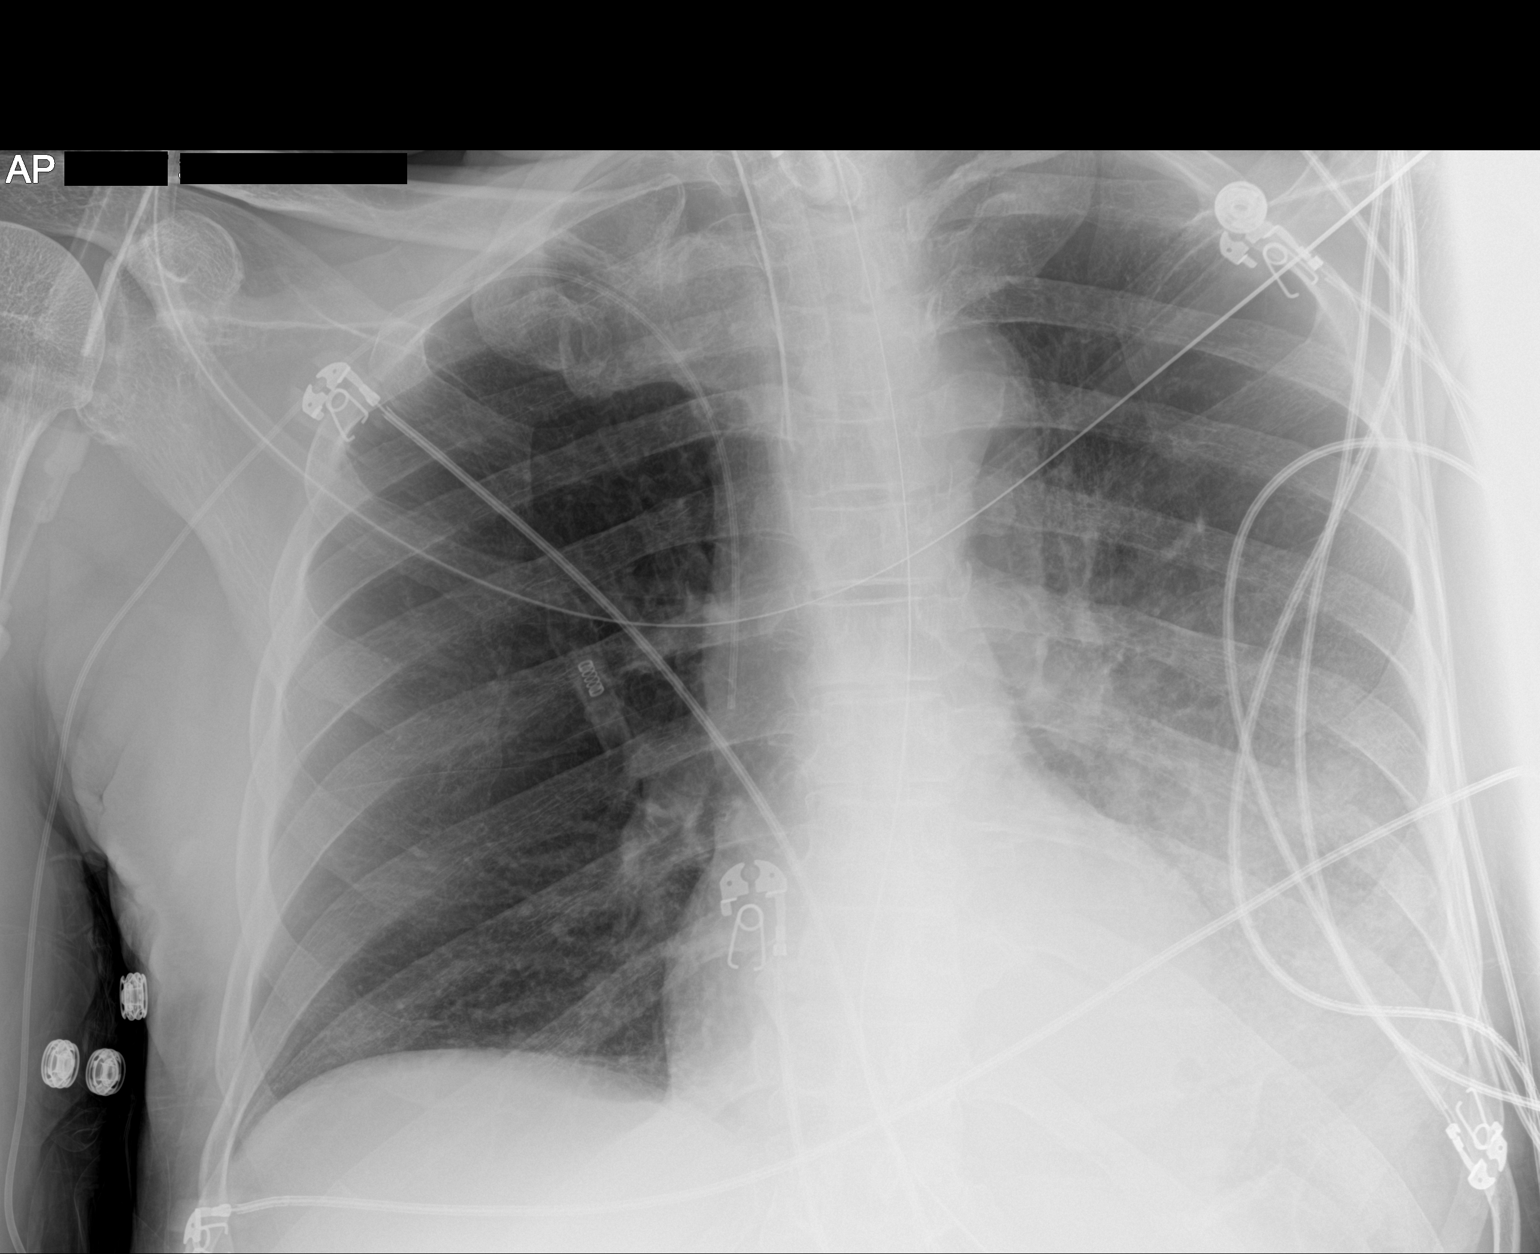

[1 of 1 positions shown; findings below may reference images not displayed]

FINDINGS: Left-sided chest tube is not seen. Stable appearance of the support
apparatus otherwise.

Cardiomediastinal silhouette is normal. Mediastinal contours appear
intact.

There is no evidence of pneumothorax. There is improved from the
latest radiograph aeration of the left lung with persistent but less
dense airspace consolidation in the left lower lung field. Suspected
left pleural effusion. Mild peribronchial atelectasis versus
airspace consolidation in the right lung base.

Osseous structures are without acute abnormality. Soft tissues are
grossly normal.
IMPRESSION: Relative improvement in the left lower lung field airspace
consolidation and left pleural effusion.

Mild peribronchial right lower lobe airspace disease versus
atelectasis.

## 2019-07-19 IMAGING — DX DG CHEST 1V PORT
1 series · 2 of 2 positions shown · non-contrast
Comparison: 08/09/2018

CLINICAL DATA: Intubation

EXAM:
PORTABLE CHEST 1 VIEW

[Series 1: chest · 0.14mm/px · 2 of 2 slices shown]
[im 1/2]
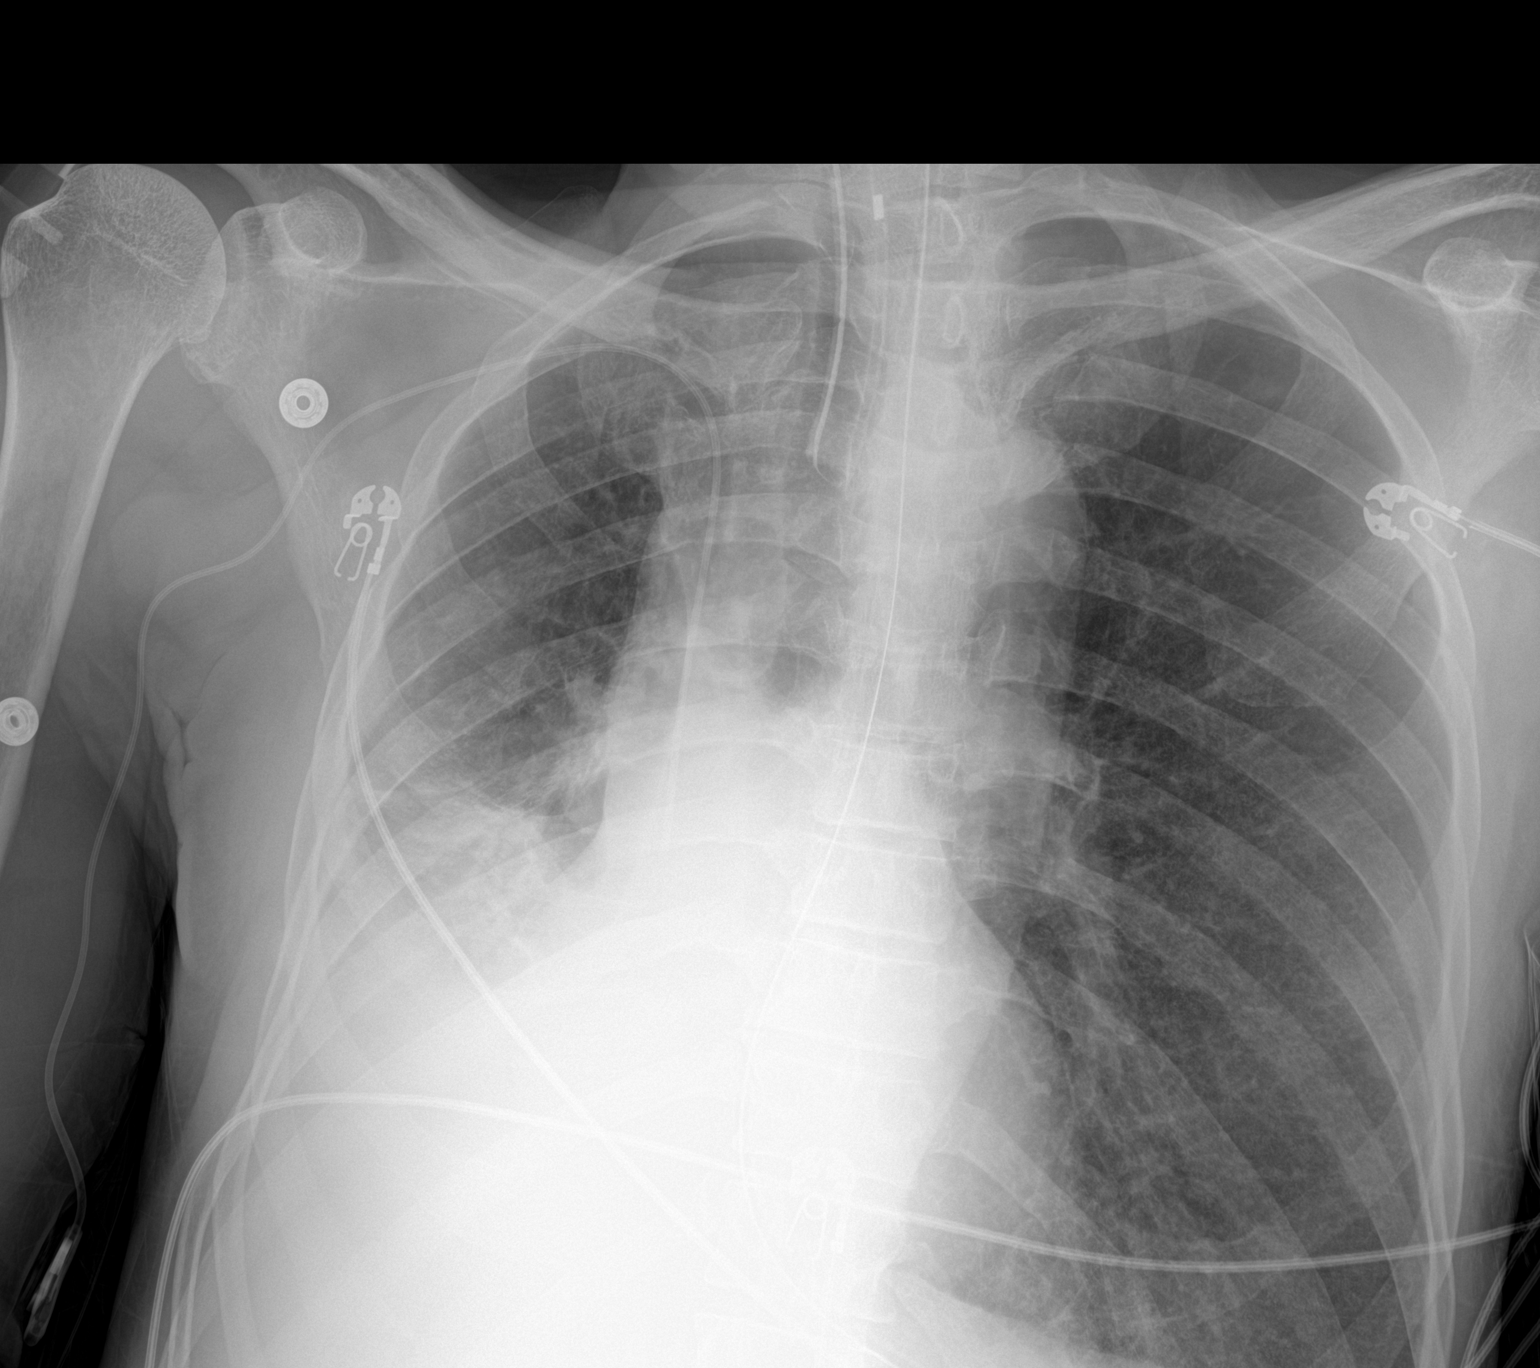
[im 2/2]
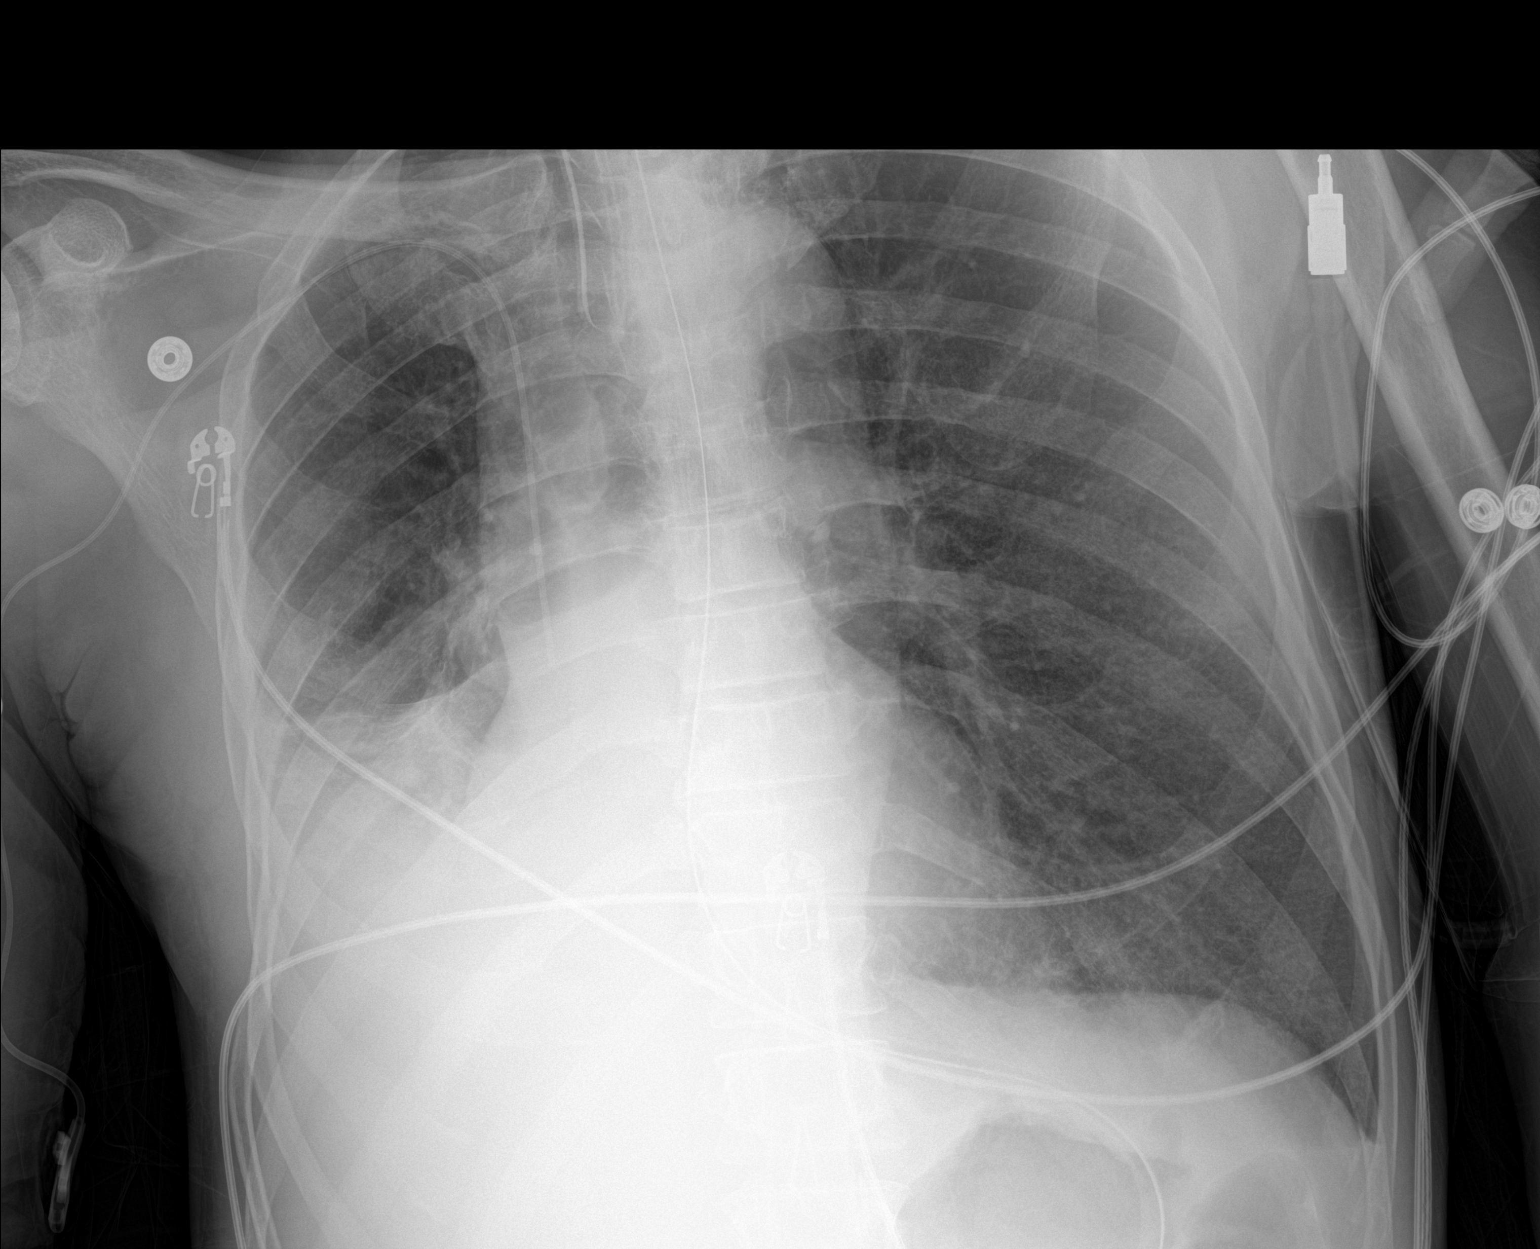

[2 of 2 positions shown; findings below may reference images not displayed]

FINDINGS: Support Apparatus:

--Endotracheal tube: Tip just below the level of the clavicular
heads.

--Enteric tube:Tip and side port below the diaphragm.

--Catheter(s):Right PICC line tip at the cavoatrial junction

--Other: None

Improved aeration of the left lower lobe. There is increased
consolidation at the right lung base, likely atelectasis.
IMPRESSION: 1. Unchanged support apparatus.
2. Improved aeration of the left lower lobe.
3. Worsened aeration of the right lower lobe, likely indicating
atelectasis.

## 2019-07-23 IMAGING — DX DG CHEST 1V PORT
1 series · 1 of 1 positions shown · non-contrast
Comparison: 08/13/2018

CLINICAL DATA: Ventilator dependence

EXAM:
PORTABLE CHEST 1 VIEW

[chest ap]
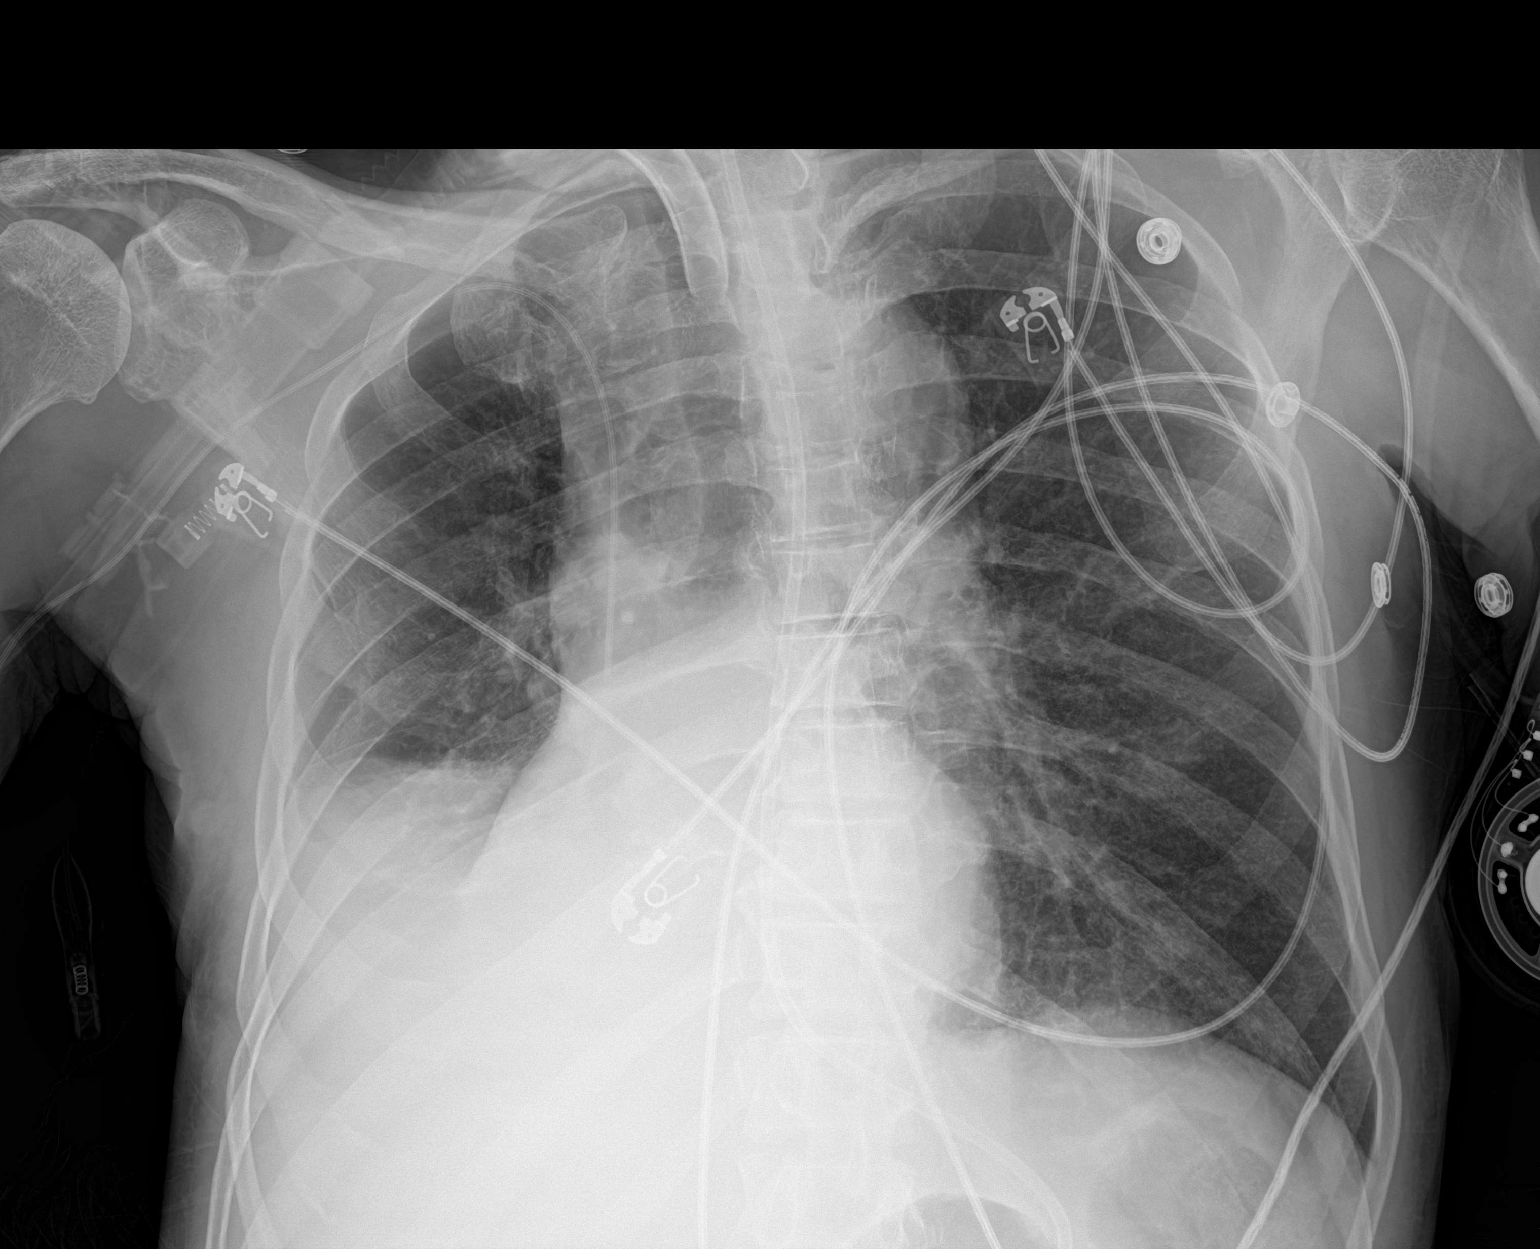

[1 of 1 positions shown; findings below may reference images not displayed]

FINDINGS: Tracheostomy in satisfactory position.

Enteric tube courses below the diaphragm.

Right arm PICC terminates at the cavoatrial junction.

Patient is rotated. Right lower lobe opacity, likely reflecting a
combination of atelectasis and right pleural effusion. Left lung is
clear. No pneumothorax.

The heart is normal in size.
IMPRESSION: Right lower lobe opacity, likely reflecting a combination of
atelectasis and right pleural effusion.

Stable support apparatus as above.

## 2019-07-26 IMAGING — DX DG CHEST 1V PORT
1 series · 1 of 1 positions shown · non-contrast
Comparison: 08/16/2018.

CLINICAL DATA: Tracheostomy tube. On a ventilator

EXAM:
PORTABLE CHEST 1 VIEW

[chest]
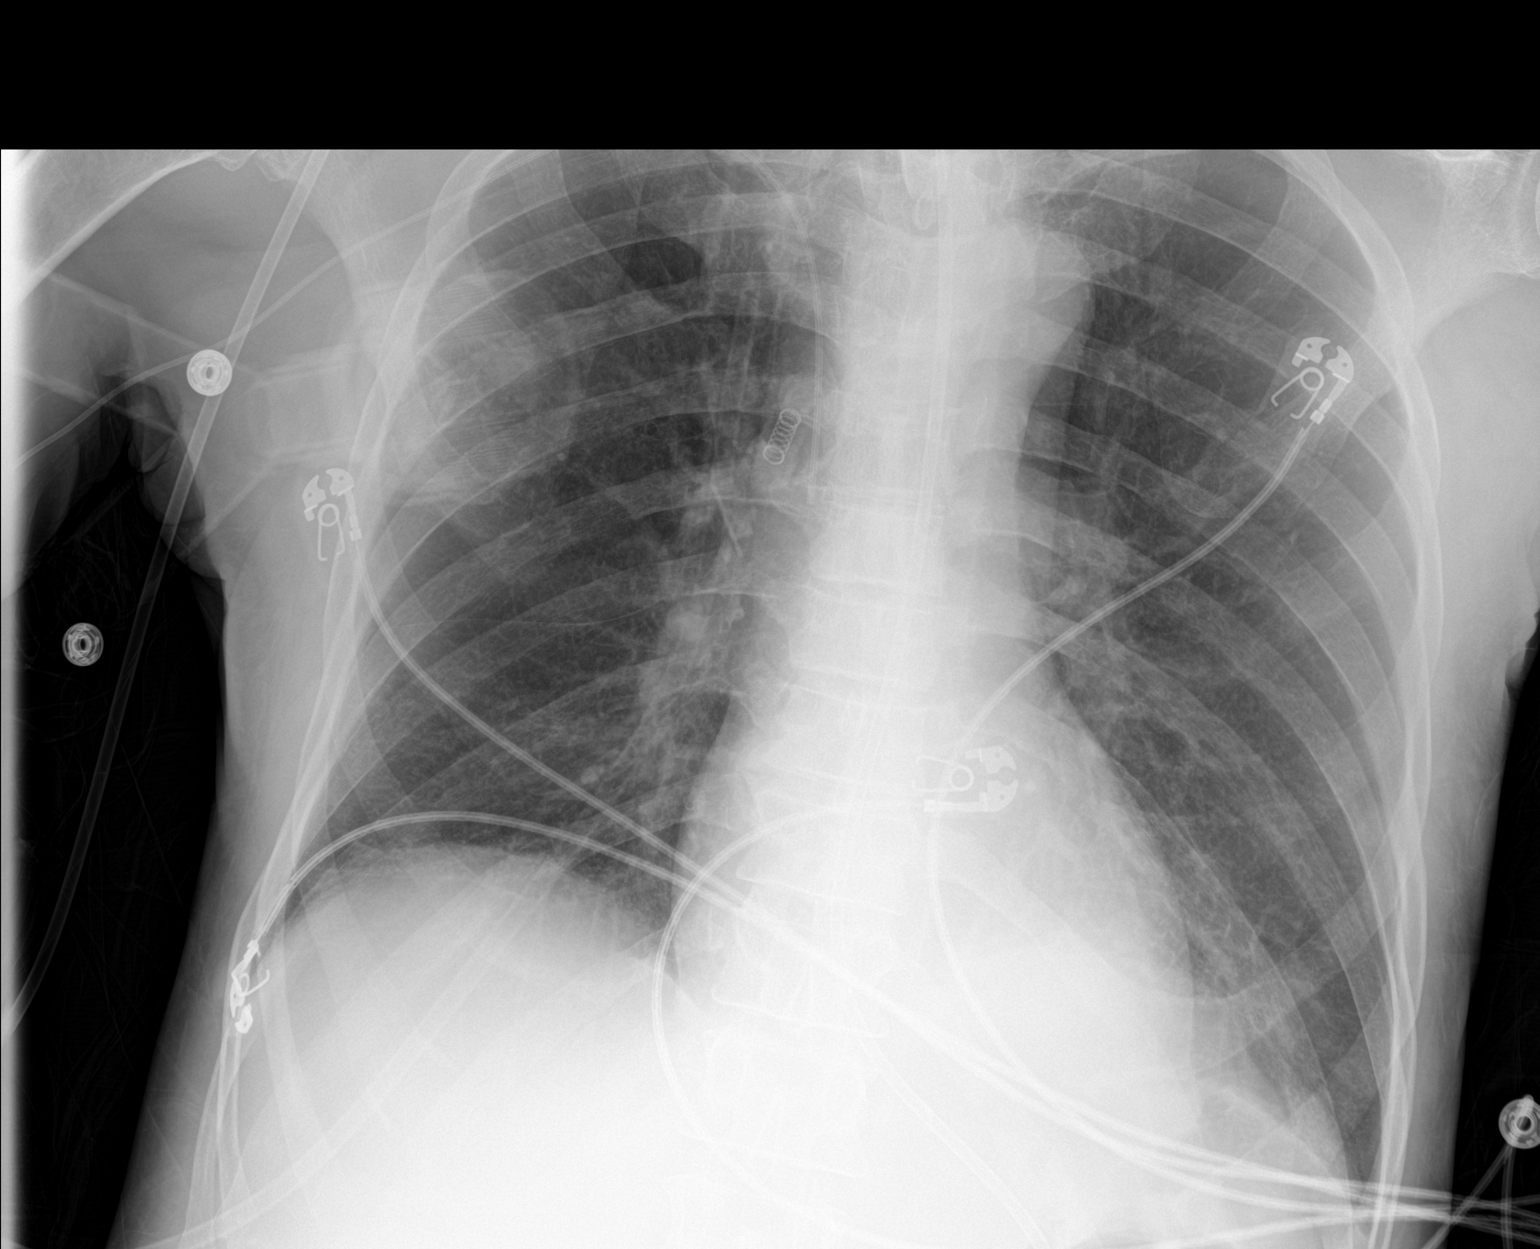

[1 of 1 positions shown; findings below may reference images not displayed]

FINDINGS: Tracheostomy tube in satisfactory position. Right PICC tip in the
superior vena cava approximately 8 cm above the superior cavoatrial
junction. Feeding tube extending into the stomach. Interval patchy
opacity in the left lower lobe. Significantly improved right basilar
atelectasis with minimal residual atelectasis. Mild right
glenohumeral joint spur formation.
IMPRESSION: 1. Interval left lower lobe atelectasis or pneumonia.
2. Significantly improved right basilar atelectasis with minimal
residual atelectasis.

## 2019-07-28 IMAGING — DX DG CHEST 1V PORT
1 series · 1 of 1 positions shown · non-contrast
Comparison: Radiograph August 18, 2018.

CLINICAL DATA: Ventilator dependence.

EXAM:
PORTABLE CHEST 1 VIEW

[chest]
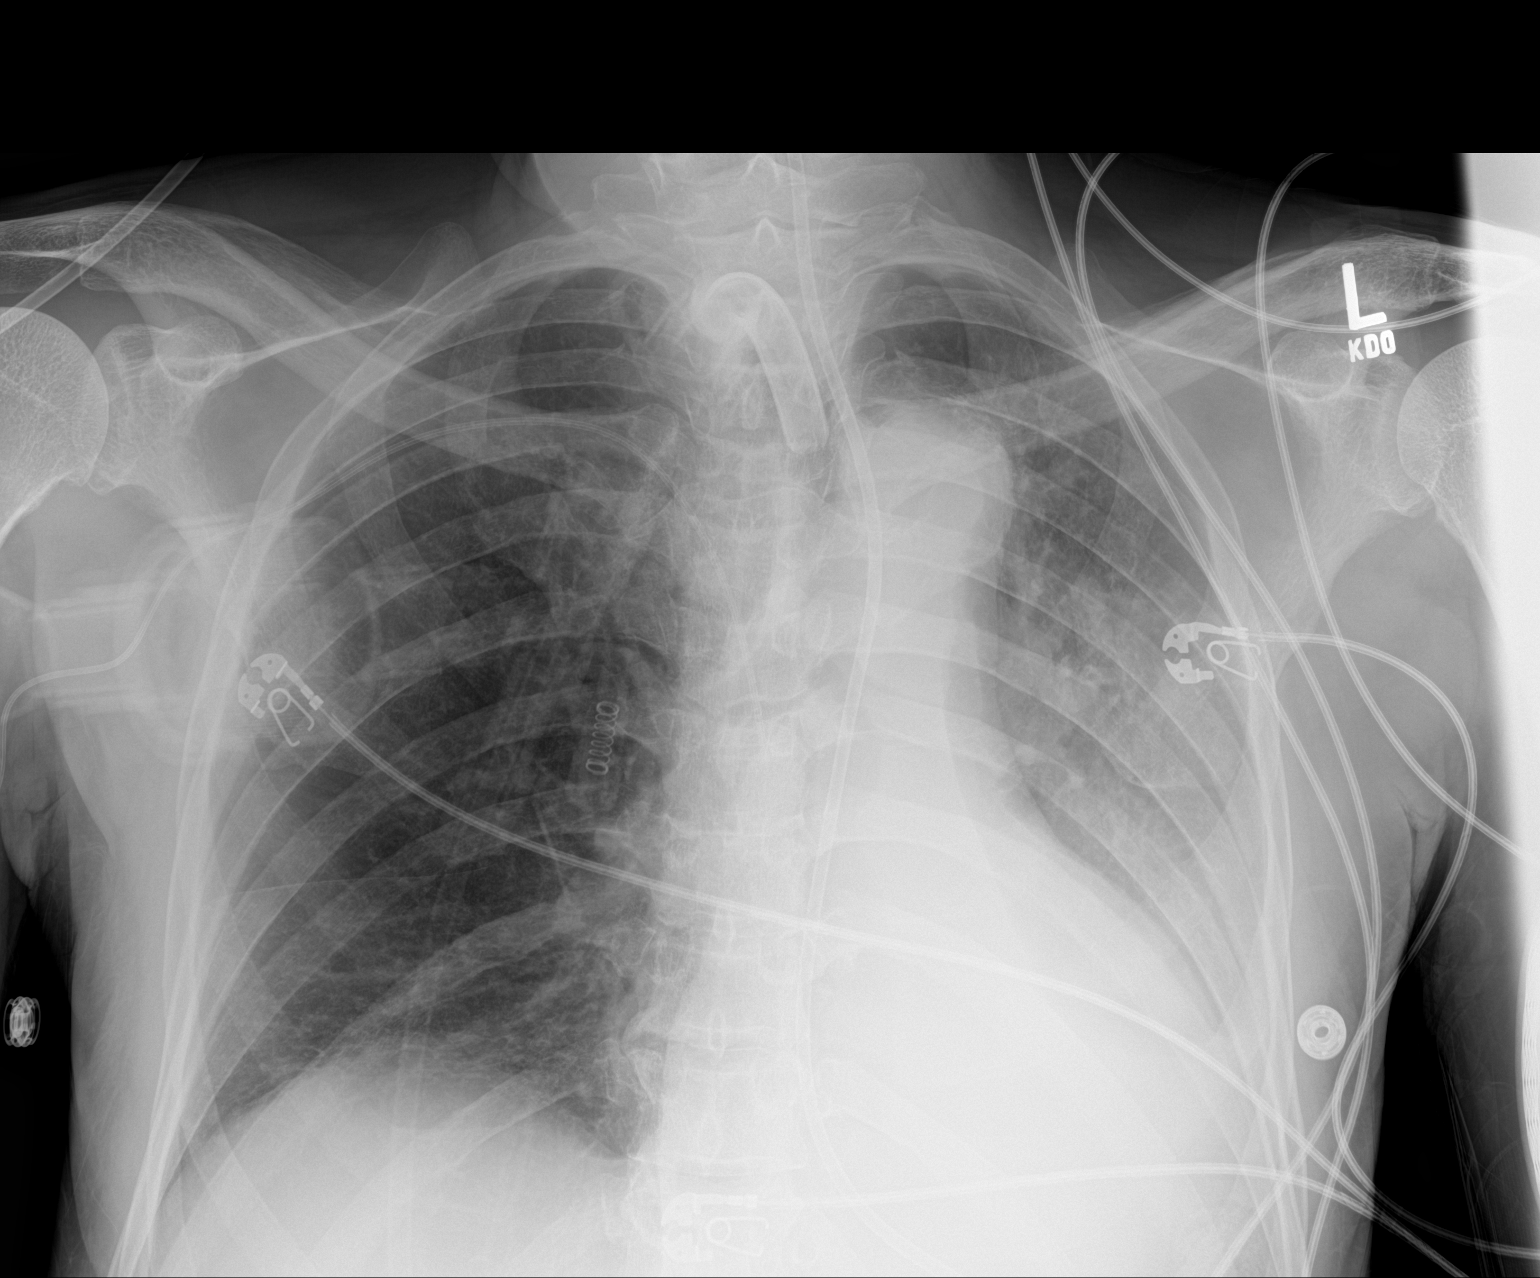

[1 of 1 positions shown; findings below may reference images not displayed]

FINDINGS: Stable cardiomediastinal silhouette. Tracheostomy and feeding tubes
are unchanged. Right-sided PICC line is unchanged in position. No
pneumothorax is noted. Increased left lower lobe airspace opacity is
noted consistent with pneumonia and associated pleural effusion.
Mild right basilar subsegmental atelectasis is noted. Bony thorax is
unremarkable.
IMPRESSION: Stable support apparatus. Increased left lower lobe opacity is noted
consistent with worsening pneumonia and probable associated pleural
effusion.

## 2019-08-09 DIAGNOSIS — Z794 Long term (current) use of insulin: Secondary | ICD-10-CM | POA: Insufficient documentation

## 2019-08-09 DIAGNOSIS — E785 Hyperlipidemia, unspecified: Secondary | ICD-10-CM | POA: Insufficient documentation

## 2019-08-09 DIAGNOSIS — I10 Essential (primary) hypertension: Secondary | ICD-10-CM | POA: Insufficient documentation

## 2019-08-09 DIAGNOSIS — J9621 Acute and chronic respiratory failure with hypoxia: Secondary | ICD-10-CM | POA: Insufficient documentation

## 2019-08-09 DIAGNOSIS — E119 Type 2 diabetes mellitus without complications: Secondary | ICD-10-CM | POA: Insufficient documentation

## 2020-05-09 ENCOUNTER — Telehealth: Payer: Self-pay | Admitting: Physician Assistant

## 2020-05-09 NOTE — Telephone Encounter (Signed)
I connected by phone with Wayne Young and/or patient's caregiver on 05/09/2020 at 3:57 PM to discuss the potential vaccination through our Homebound vaccination initiative.   Prevaccination Checklist for COVID-19 Vaccines  1.  Are you feeling sick today? no  2.  Have you ever received a dose of a COVID-19 vaccine?  yes      If yes, which one? Moderna - on 03/28/20  3.  Have you ever had an allergic reaction: (This would include a severe reaction [ e.g., anaphylaxis] that required treatment with epinephrine or EpiPen or that caused you to go to the hospital.  It would also include an allergic reaction that occurred within 4 hours that caused hives, swelling, or respiratory distress, including wheezing.) A.  A previous dose of COVID-19 vaccine. no  B.  A vaccine or injectable therapy that contains multiple components, one of which is a COVID-19 vaccine component, but it is not known which component elicited the immediate reaction. no  C.  Are you allergic to polyethylene glycol? no  D. Are you allergic to Polysorbate, which is found in some vaccines, film coated tablets and intravenous steroids?  no   4.  Have you ever had an allergic reaction to another vaccine (other than COVID-19 vaccine) or an injectable medication? (This would include a severe reaction [ e.g., anaphylaxis] that required treatment with epinephrine or EpiPen or that caused you to go to the hospital.  It would also include an allergic reaction that occurred within 4 hours that caused hives, swelling, or respiratory distress, including wheezing.)  no   5.  Have you ever had a severe allergic reaction (e.g., anaphylaxis) to something other than a component of the COVID-19 vaccine, or any vaccine or injectable medication?  This would include food, pet, venom, environmental, or oral medication allergies.  no   6.  Have you received any vaccine in the last 14 days? no   7.  Have you ever had a positive test for COVID-19 or has a  doctor ever told you that you had COVID-19?  no   8.  Have you received passive antibody therapy (monoclonal antibodies or convalescent serum) as a treatment for COVID-19? no   9.  Do you have a weakened immune system caused by something such as HIV infection or cancer or do you take immunosuppressive drugs or therapies?  no   10.  Do you have a bleeding disorder or are you taking a blood thinner? no   11.  Are you pregnant or breast-feeding? no   12.  Do you have dermal fillers? no   __________________   This patient is a 64 y.o. male that meets the FDA criteria to receive homebound vaccination. Patient or parent/caregiver understands they have the option to accept or refuse homebound vaccination.  Patient passed the pre-screening checklist and would like to proceed with homebound vaccination.  Based on questionnaire above, I recommend the patient be observed for 15 minutes.  There are no other household members/caregivers who are also interested in receiving the vaccine.    He had the Moderna shot on 6/10. He has ALS and is wheelchair bound and hasnt been able to get seconds.  I will send the patient's information to our scheduling team who will reach out to schedule the patient and potential caregiver/family members for homebound vaccination.    Wayne Young 05/09/2020 3:57 PM

## 2020-05-16 ENCOUNTER — Other Ambulatory Visit: Payer: Self-pay

## 2020-05-16 ENCOUNTER — Ambulatory Visit (INDEPENDENT_AMBULATORY_CARE_PROVIDER_SITE_OTHER): Payer: Medicare HMO | Admitting: Podiatry

## 2020-05-16 DIAGNOSIS — G1221 Amyotrophic lateral sclerosis: Secondary | ICD-10-CM | POA: Diagnosis not present

## 2020-05-16 DIAGNOSIS — E1142 Type 2 diabetes mellitus with diabetic polyneuropathy: Secondary | ICD-10-CM

## 2020-05-16 DIAGNOSIS — E1169 Type 2 diabetes mellitus with other specified complication: Secondary | ICD-10-CM | POA: Diagnosis not present

## 2020-05-16 DIAGNOSIS — B351 Tinea unguium: Secondary | ICD-10-CM | POA: Diagnosis not present

## 2020-05-16 MED ORDER — FLUCONAZOLE 150 MG PO TABS
150.0000 mg | ORAL_TABLET | ORAL | 2 refills | Status: DC
Start: 2020-05-16 — End: 2021-03-29

## 2020-05-16 MED ORDER — KETOCONAZOLE 2 % EX CREA: TOPICAL_CREAM | CUTANEOUS | 0 refills | Status: DC

## 2020-05-16 NOTE — Progress Notes (Signed)
  Subjective:  Patient ID: Wayne Young, male    DOB: [birth year],  MRN: [id number]  Chief Complaint  Patient presents with  . Nail Problem    pt is here for a nail trim   64 y.o. male presents with the above complaint. History confirmed with patient. Pt presents with caregiver. On a vent in a wheelchair. Has ALS.  Objective:  Physical Exam: warm, good capillary refill, no trophic changes or ulcerative lesions, normal DP and PT pulses absent protective sensation and onychomycosis. Pt is in wheelchair. On Vent.  Assessment:   1. Onychomycosis of multiple toenails with type 2 diabetes mellitus and peripheral neuropathy (HCC)   2. ALS (amyotrophic lateral sclerosis) (HCC)    Plan:  Patient was evaluated and treated and all questions answered.  Onychomycosis, Diabetes and DPN -Patient is diabetic with a qualifying condition for at risk foot care.  Procedure: Nail Debridement Rationale: Patient meets criteria for routine foot care due to DPN Type of Debridement: manual, sharp debridement. Instrumentation: Nail nipper, rotary burr. Number of Nails: 10  Return in about 3 months (around 08/16/2020) for Diabetic Foot Care.

## 2020-05-17 ENCOUNTER — Telehealth: Payer: Self-pay | Admitting: *Deleted

## 2020-05-17 MED ORDER — KETOCONAZOLE 2 % EX CREA: TOPICAL_CREAM | CUTANEOUS | 5 refills | Status: DC

## 2020-05-17 MED ORDER — KETOCONAZOLE 2 % EX CREA: TOPICAL_CREAM | CUTANEOUS | 0 refills | Status: DC

## 2020-05-17 NOTE — Telephone Encounter (Signed)
Wayne Young asked if the ketaconazole was to be applied to the feet or as the pt states the toenails, and if it is the feet can he get a larger amount.

## 2020-05-17 NOTE — Addendum Note (Signed)
Addended by: Alphia Kava D on: 05/17/2020 05:45 PM   Modules accepted: Orders

## 2020-05-17 NOTE — Telephone Encounter (Signed)
I informed Laural Golden for Dr. Kandice Hams orders.

## 2020-05-17 NOTE — Telephone Encounter (Signed)
Wayne Young states she needs clarification of medication.

## 2020-05-17 NOTE — Telephone Encounter (Signed)
Patient can apply to bottom of feet and to the nails. He can stop applying to bottom of the feet when scaling resolves.  Sent larger 60g tube rx to the pharmacy. Also available OTC.

## 2020-05-21 ENCOUNTER — Ambulatory Visit: Payer: Medicaid Other

## 2020-05-22 ENCOUNTER — Ambulatory Visit: Payer: Medicare HMO | Attending: Critical Care Medicine

## 2020-05-22 DIAGNOSIS — Z23 Encounter for immunization: Secondary | ICD-10-CM

## 2020-05-22 NOTE — Progress Notes (Signed)
   Covid-19 Vaccination Clinic  Name:  Wayne Young    MRN: [id number] DOB: Mar 20, 1956  05/22/2020  Mr. Wayne Young was observed post Covid-19 immunization for 15 minutes without incident. He was provided with Vaccine Information Sheet and instruction to access the V-Safe system.   Mr. Wayne Young was instructed to call 911 with any severe reactions post vaccine: Marland Kitchen Difficulty breathing  . Swelling of face and throat  . A fast heartbeat  . A bad rash all over body  . Dizziness and weakness   Immunizations Administered    Name Date Dose VIS Date Route   Moderna COVID-19 Vaccine 05/22/2020  3:10 PM 0.5 mL 09/2019 Intramuscular   Manufacturer: Moderna   Lot: [id number]   NDC: 81594-707-61

## 2020-06-09 ENCOUNTER — Other Ambulatory Visit: Payer: Self-pay | Admitting: Podiatry

## 2020-08-12 ENCOUNTER — Other Ambulatory Visit (HOSPITAL_COMMUNITY): Payer: Self-pay | Admitting: Student

## 2020-08-12 DIAGNOSIS — E785 Hyperlipidemia, unspecified: Secondary | ICD-10-CM

## 2020-08-12 DIAGNOSIS — I10 Essential (primary) hypertension: Secondary | ICD-10-CM

## 2020-08-12 DIAGNOSIS — R600 Localized edema: Secondary | ICD-10-CM

## 2020-08-14 ENCOUNTER — Ambulatory Visit: Payer: Medicare HMO

## 2020-08-20 ENCOUNTER — Ambulatory Visit: Payer: Medicare HMO | Admitting: Podiatry

## 2020-08-21 ENCOUNTER — Ambulatory Visit: Payer: Medicare HMO

## 2020-09-03 ENCOUNTER — Other Ambulatory Visit (HOSPITAL_COMMUNITY): Payer: Medicare HMO

## 2020-09-26 ENCOUNTER — Other Ambulatory Visit: Payer: Self-pay

## 2020-09-26 ENCOUNTER — Ambulatory Visit (HOSPITAL_COMMUNITY): Payer: Medicare HMO | Attending: Cardiovascular Disease

## 2020-09-26 DIAGNOSIS — R600 Localized edema: Secondary | ICD-10-CM | POA: Diagnosis present

## 2020-09-26 DIAGNOSIS — I10 Essential (primary) hypertension: Secondary | ICD-10-CM | POA: Insufficient documentation

## 2020-09-26 DIAGNOSIS — E785 Hyperlipidemia, unspecified: Secondary | ICD-10-CM | POA: Diagnosis present

## 2020-09-26 LAB — ECHOCARDIOGRAM COMPLETE
Area-P 1/2: 6.17 cm2
S' Lateral: 3.5 cm

## 2020-10-31 ENCOUNTER — Other Ambulatory Visit: Payer: Self-pay | Admitting: Student

## 2020-11-08 ENCOUNTER — Ambulatory Visit: Payer: Medicare HMO | Admitting: Podiatry

## 2020-11-20 ENCOUNTER — Ambulatory Visit: Payer: Medicare HMO

## 2020-11-21 ENCOUNTER — Ambulatory Visit: Payer: Medicare HMO | Attending: Family Medicine

## 2020-11-21 DIAGNOSIS — Z23 Encounter for immunization: Secondary | ICD-10-CM

## 2020-12-06 ENCOUNTER — Other Ambulatory Visit: Payer: Self-pay

## 2020-12-06 ENCOUNTER — Ambulatory Visit
Admission: RE | Admit: 2020-12-06 | Discharge: 2020-12-06 | Disposition: A | Discharge status: Home / Self Care | Payer: Medicare HMO | Source: Ambulatory Visit | Attending: Student | Admitting: Student

## 2020-12-06 ENCOUNTER — Other Ambulatory Visit: Payer: Self-pay | Admitting: Student

## 2020-12-06 DIAGNOSIS — R195 Other fecal abnormalities: Secondary | ICD-10-CM

## 2020-12-06 DIAGNOSIS — R143 Flatulence: Secondary | ICD-10-CM

## 2021-01-06 ENCOUNTER — Ambulatory Visit: Payer: Medicare HMO | Admitting: Podiatry

## 2021-01-14 ENCOUNTER — Encounter: Payer: Self-pay | Admitting: Podiatry

## 2021-01-14 ENCOUNTER — Ambulatory Visit (INDEPENDENT_AMBULATORY_CARE_PROVIDER_SITE_OTHER): Payer: Medicare HMO | Admitting: Podiatry

## 2021-01-14 ENCOUNTER — Other Ambulatory Visit: Payer: Self-pay

## 2021-01-14 DIAGNOSIS — B351 Tinea unguium: Secondary | ICD-10-CM | POA: Diagnosis not present

## 2021-01-14 DIAGNOSIS — S91115A Laceration without foreign body of left lesser toe(s) without damage to nail, initial encounter: Secondary | ICD-10-CM

## 2021-01-14 DIAGNOSIS — E1169 Type 2 diabetes mellitus with other specified complication: Secondary | ICD-10-CM | POA: Diagnosis not present

## 2021-01-14 DIAGNOSIS — G1221 Amyotrophic lateral sclerosis: Secondary | ICD-10-CM

## 2021-01-14 DIAGNOSIS — E1142 Type 2 diabetes mellitus with diabetic polyneuropathy: Secondary | ICD-10-CM

## 2021-01-15 ENCOUNTER — Ambulatory Visit: Payer: Medicare HMO | Admitting: Podiatry

## 2021-01-16 NOTE — Progress Notes (Signed)
Subjective:  Patient ID: Wayne Young, male    DOB: August 04, 1956,  MRN: [id number]  65 y.o. male presents with at risk foot care with history of diabetic neuropathy and painful thick toenails that are difficult to trim. Pain interferes with ambulation. Aggravating factors include wearing enclosed shoe gear. Pain is relieved with periodic professional debridement..    Patient's blood sugar was 171 mg/dl this morning.  PCP: Wayne Mile, NP and last visit was: two weeks ago.  Review of Systems: new wound plantar aspect left 3rd digit; on vent secondary to ALS.  Patient's family member and [place] Nurse, Wayne Young with [facility]. They state Wayne Young injured his left 3rd toe on Saturday while transferring from motorized chair to the commode. States left foot got caught on chair and toe started bleeding. They have been keeping it clean and applying antibiotic ointment daily.  Date of injury 01/11/2021.  Current Outpatient Medications:  .  folic acid (FOLVITE) 1 MG tablet, Take 1 tablet by mouth daily., Disp: , Rfl:  .  furosemide (LASIX) 20 MG tablet, , Disp: , Rfl:  .  methotrexate (RHEUMATREX) 2.5 MG tablet, Take by mouth., Disp: , Rfl:  .  PARoxetine (PAXIL) 20 MG tablet, Take 1 tablet by mouth every morning., Disp: , Rfl:  .  potassium chloride 20 MEQ/15ML (10%) SOLN, TAKE 15 MILLITERS DILUTED IN ONE-HALF GLASS OF COLD WATER OR JUICE BY MOUTH 2 TIMES DAILY, Disp: , Rfl:  .  ACCU-CHEK AVIVA PLUS test strip, , Disp: , Rfl:  .  acetaminophen (TYLENOL) 325 MG tablet, Place 2 tablets (650 mg total) into feeding tube every 6 (six) hours as needed for mild pain, fever or headache., Disp: , Rfl:  .  albuterol (PROVENTIL) (2.5 MG/3ML) 0.083% nebulizer solution, Take 3 mLs (2.5 mg total) by nebulization every 2 (two) hours as needed for wheezing or shortness of breath., Disp: 75 mL, Rfl: 12 .  amLODipine (NORVASC) 10 MG tablet, Place 1 tablet (10 mg total) into feeding tube  daily., Disp: , Rfl:  .  atorvastatin (LIPITOR) 20 MG tablet, , Disp: , Rfl:  .  brimonidine (ALPHAGAN) 0.2 % ophthalmic solution, Place 2 drops into both eyes 3 (three) times daily., Disp: 5 mL, Rfl: 12 .  Chlorhexidine Gluconate Cloth 2 % PADS, Apply 6 each topically daily., Disp: , Rfl:  .  chlorhexidine gluconate, MEDLINE KIT, (PERIDEX) 0.12 % solution, 15 mLs by Mouth Rinse route 2 (two) times daily., Disp: 120 mL, Rfl: 0 .  docusate (COLACE) 50 MG/5ML liquid, Place 10 mLs (100 mg total) into feeding tube daily., Disp: 100 mL, Rfl: 0 .  DROPLET INSULIN SYRINGE 31G X 5/16" 1 ML MISC, , Disp: , Rfl:  .  enoxaparin (LOVENOX) 40 MG/0.4ML injection, Inject 0.4 mLs (40 mg total) into the skin daily., Disp: 0 Syringe, Rfl:  .  famotidine (PEPCID) 40 MG/5ML suspension, Place 2.5 mLs (20 mg total) into feeding tube 2 (two) times daily., Disp: 50 mL, Rfl: 0 .  fentaNYL (SUBLIMAZE) 100 MCG/2ML injection, Inject 0.5-2 mLs (25-100 mcg total) into the vein every 2 (two) hours as needed for severe pain., Disp: 2 mL, Rfl: 0 .  fluconazole (DIFLUCAN) 150 MG tablet, Take 1 tablet (150 mg total) by mouth once a week., Disp: 4 tablet, Rfl: 2 .  fluticasone (CUTIVATE) 0.05 % cream, , Disp: , Rfl:  .  guaiFENesin (ROBITUSSIN) 100 MG/5ML SOLN, Place 10 mLs (200 mg total) into feeding tube every 4 (four) hours., Disp:  1200 mL, Rfl: 0 .  hydrochlorothiazide (HYDRODIURIL) 25 MG tablet, , Disp: , Rfl:  .  Hydrocortisone (GERHARDT'S BUTT CREAM) CREA, Apply 1 application topically as needed for irritation (apply to buttocks, groin, areas of MASD)., Disp: , Rfl:  .  hydrOXYzine (ATARAX/VISTARIL) 25 MG tablet, , Disp: , Rfl:  .  insulin aspart (NOVOLOG) 100 UNIT/ML injection, Inject 0-9 Units into the skin every 4 (four) hours., Disp: 10 mL, Rfl: 11 .  insulin glargine (LANTUS) 100 UNIT/ML injection, Inject 0.1 mLs (10 Units total) into the skin daily., Disp: 10 mL, Rfl: 11 .  ketoconazole (NIZORAL) 2 % cream, APPLY 1  FINGERTIP AMOUNT TO EACH FOOT DAILY, Disp: 30 g, Rfl: 0 .  labetalol (NORMODYNE,TRANDATE) 5 MG/ML injection, Inject 2 mLs (10 mg total) into the vein every 6 (six) hours as needed (systolic greater than 527)., Disp: 20 mL, Rfl:  .  levalbuterol (XOPENEX) 1.25 MG/0.5ML nebulizer solution, Take 1.25 mg by nebulization every 6 (six) hours., Disp: 1 each, Rfl: 12 .  levETIRAcetam (KEPRRA) 500 MG/100ML SOLN, Inject 100 mLs (500 mg total) into the vein every 12 (twelve) hours., Disp: 4000 mL, Rfl:  .  LORazepam (ATIVAN) 2 MG/ML injection, Inject 0.25 mLs (0.5 mg total) into the vein every 4 (four) hours as needed for anxiety., Disp: 1 mL, Rfl: 0 .  mouth rinse LIQD solution, 15 mLs by Mouth Rinse route every 2 (two) hours., Disp: , Rfl: 0 .  Nutritional Supplements (FEEDING SUPPLEMENT, VITAL AF 1.2 CAL,) LIQD, Place 1,000 mLs into feeding tube continuous., Disp: , Rfl:  .  ondansetron (ZOFRAN) 4 MG/2ML SOLN injection, Inject 2 mLs (4 mg total) into the vein every 8 (eight) hours as needed for nausea or vomiting., Disp: 2 mL, Rfl: 0 .  pantoprazole (PROTONIX) 20 MG tablet, , Disp: , Rfl:  .  scopolamine (TRANSDERM-SCOP) 1 MG/3DAYS, Place 1 patch (1.5 mg total) onto the skin every 3 (three) days., Disp: 10 patch, Rfl: 12 .  sennosides (SENOKOT) 8.8 MG/5ML syrup, Take 10 mLs by mouth every 12 (twelve) hours as needed for mild constipation., Disp: 240 mL, Rfl: 0 .  sodium chloride 0.9 % infusion, Inject 10 mLs into the vein continuous., Disp: , Rfl: 0 .  sodium chloride flush (NS) 0.9 % SOLN, 10-40 mLs by Intracatheter route every 12 (twelve) hours., Disp: , Rfl:  .  sodium chloride flush (NS) 0.9 % SOLN, 10-40 mLs by Intracatheter route as needed (flush)., Disp: , Rfl:  .  sodium chloride HYPERTONIC 3 % nebulizer solution, Take 4 mLs by nebulization 2 (two) times daily., Disp: 750 mL, Rfl: 12 .  sulfamethoxazole-trimethoprim (BACTRIM DS) 800-160 MG tablet, , Disp: , Rfl:    No Known Allergies  Objective:   There were no vitals filed for this visit. Constitutional Patient is a pleasant 65 y.o. African American male in NAD. AAO x 3.  Vascular Capillary fill time to digits <3 seconds b/l lower extremities. Palpable pedal pulses b/l LE. Pedal hair absent. Lower extremity skin temperature gradient within normal limits. Dependent edema noted b/l feet. No pain with calf compression b/l. No cyanosis or clubbing noted.  Neurologic Normal speech. Protective sensation diminished with 10g monofilament b/l.  Dermatologic Pedal skin with normal turgor, texture and tone bilaterally. No interdigital macerations bilaterally. Toenails 1-5 b/l elongated, discolored, dystrophic, thickened, crumbly with subungual debris and tenderness to dorsal palpation. Transverse laceration plantar aspect left 3rd digit measuring 0.5 x 2.0 x 0.3 cm with granular base. No erythema, no edema,  no purulence, no odor.  Orthopedic: Flaccid lower extremities noted b/l. No pain crepitus or joint limitation noted with ROM b/l. Hammertoes noted to the 2-5 bilaterally. Utilizes motorized chair for mobility assistance.   Assessment:   1. Onychomycosis of multiple toenails with type 2 diabetes mellitus and peripheral neuropathy ([place])   2. Laceration of lesser toe of left foot without foreign body present or damage to nail, initial encounter   3. ALS (amyotrophic lateral sclerosis) ([place])   4. Diabetic peripheral neuropathy associated with type 2 diabetes mellitus ([place])    Plan:  Patient was evaluated and treated and all questions answered.  Onychomycosis with pain -Nails palliatively debridement as below. -Educated on self-care  Procedure: Nail Debridement Rationale: Pain Type of Debridement: manual, sharp debridement. Instrumentation: Nail nipper, rotary burr. Number of Nails: 10  -Examined patient. -Patient to continue soft, supportive shoe gear daily. -Toenails 1-5 b/l were debrided in length and girth with sterile nail nippers and  dremel without iatrogenic bleeding.  -Patient to report any pedal injuries to medical professional immediately. -Wound cleansed and light dressing applied left 3rd toe. -Patient also seen in office by and referred to Dr. Hardie Pulley for follow-up of left 3rd toe laceration. He will be seen for suture repair of left 3rd toe this Friday. -Patient/POA to call should there be question/concern in the interim.  Return in about 3 months (around 04/16/2021).  Marzetta Board, DPM

## 2021-01-17 ENCOUNTER — Other Ambulatory Visit: Payer: Self-pay

## 2021-01-17 ENCOUNTER — Ambulatory Visit (INDEPENDENT_AMBULATORY_CARE_PROVIDER_SITE_OTHER): Payer: Medicare HMO | Admitting: Podiatry

## 2021-01-17 DIAGNOSIS — S91115D Laceration without foreign body of left lesser toe(s) without damage to nail, subsequent encounter: Secondary | ICD-10-CM

## 2021-01-17 NOTE — Progress Notes (Signed)
  Subjective:  Patient ID: Harjot Dibello, male    DOB: 1955-12-25,  MRN: [id number]  Chief Complaint  Patient presents with  . Wound Check    65 y.o. male presents for wound care. Hx confirmed with patient.  Here for planned evaluation of possible closure of the right third digit wound. Date of injury 01/11/2021. Objective:  Physical Exam: Wound Location: Right third toe Wound Measurement: 2 cm laceration with granular wound base Peri-wound: Normal Exudate: Scant/small amount Serosanguinous exudate wound without warmth, erythema, signs of acute infection  Assessment:   1. Laceration of lesser toe of left foot without foreign body present or damage to nail, subsequent encounter      Plan:  Patient was evaluated and treated and all questions answered.  Laceration right 3rd toe -Appears to be healing, wound with some hypergranulation today. Primary closure would not be easily achievable and likely unnecessary. Continue healing by secondary intention. Dressed with abx ointment and band-aid. F/u in 2 weeks for recheck.  Return in about 2 weeks (around 01/31/2021) for Wound Care.

## 2021-01-31 ENCOUNTER — Other Ambulatory Visit (HOSPITAL_COMMUNITY): Payer: Self-pay | Admitting: *Deleted

## 2021-01-31 DIAGNOSIS — R1310 Dysphagia, unspecified: Secondary | ICD-10-CM

## 2021-02-04 ENCOUNTER — Ambulatory Visit: Payer: Medicare HMO | Admitting: Podiatry

## 2021-02-07 ENCOUNTER — Ambulatory Visit (INDEPENDENT_AMBULATORY_CARE_PROVIDER_SITE_OTHER): Payer: Medicare HMO | Admitting: Podiatry

## 2021-02-07 ENCOUNTER — Other Ambulatory Visit: Payer: Self-pay

## 2021-02-07 DIAGNOSIS — G1221 Amyotrophic lateral sclerosis: Secondary | ICD-10-CM | POA: Insufficient documentation

## 2021-02-07 DIAGNOSIS — S91115D Laceration without foreign body of left lesser toe(s) without damage to nail, subsequent encounter: Secondary | ICD-10-CM

## 2021-02-13 ENCOUNTER — Ambulatory Visit (HOSPITAL_COMMUNITY)
Admission: RE | Admit: 2021-02-13 | Discharge: 2021-02-13 | Disposition: A | Discharge status: Home / Self Care | Payer: Medicare HMO | Source: Ambulatory Visit | Attending: Student | Admitting: Student

## 2021-02-13 ENCOUNTER — Other Ambulatory Visit (HOSPITAL_COMMUNITY): Payer: Self-pay | Admitting: Student

## 2021-02-13 ENCOUNTER — Other Ambulatory Visit: Payer: Self-pay

## 2021-02-13 DIAGNOSIS — R1310 Dysphagia, unspecified: Secondary | ICD-10-CM | POA: Insufficient documentation

## 2021-02-14 NOTE — Progress Notes (Signed)
Modified Barium Swallow Progress Note  Patient Details  Name: Wayne Young MRN: [id number] Date of Birth: 1956/02/15  Today's Date: 02/14/2021  Modified Barium Swallow completed.  Full report located under Chart Review in the Imaging Section.  Brief recommendations include the following:  Clinical Impression    Pt has a pharyngeal and pharyngoesophageal dysphagia with oral phase WFL considering lack of dentition. He has reduced hyolaryngeal movement, base of tongue retraction, and pharyngeal squeeze, leading to incomplete laryngeal vestibule closure, epiglottic inversion, and UES opening. He has moderate residue with liquids throughout the pharynx but predominantly in the pyriform sinuses, as he performs multiple subswallows spontaneously that allow small amounts of barium through the UES at a time. Residue increases with more solid consistencies as well. He penetrates consistently throughout the study either during the swallow or after on the residue, but penetrates typcially stay above the vocal folds and as he spontaneously swallows and clears his throat, he keeps them out of his trachea. There was a single instance of silent aspiration with a moderate amount of thin liquids when pt was trying to clear residue from solid foods. Recommend that pt soften foods, as he has already been trying at home, pureeing and/or finely chopping/mashing them. Could also continue with small sips of thin liquids, but would monitor closely with consideration for transition to nectar thick liquids if symptoms were to progress or he is to have any concern for further respiratory issues. We discussed the potential for swallowing issues to progress with time as well. Recommend f/u wtih [facility] SLP. Pt also asked about palliative care and a feeding tube, as he says his doctor has asked about this. Education was provided, but I also encouraged ongoing discussion with his MD. Would agree with considering palliative care  involvement to help wtih delineating GOC in pt with ALS who is beginning to develop dysphagia.   Swallow Evaluation Recommendations     02/13/21 1230  Swallow Evaluation Recommendations  SLP Diet Recommendations Dysphagia 2 (Fine chop) solids;Dysphagia 1 (Puree) solids;Thin liquid  Medication Administration Crushed with puree  Supervision Full assist for feeding  Compensations Slow rate;Small sips/bites;Multiple dry swallows after each bite/sip;Clear throat intermittently  Postural Changes Seated upright at 90 degrees;Remain semi-upright after after feeds/meals (Comment)  Treatment Plan  Oral Care Recommendations Oral care QID  Treatment Recommendations Defer treatment plan to f/u with SLP  Follow up Recommendations Home health SLP             Mahala Menghini., M.A. CCC-SLP Acute Rehabilitation Services Pager [phone] Office [phone]  02/14/2021,8:56 AM

## 2021-02-18 ENCOUNTER — Telehealth: Payer: Self-pay

## 2021-02-18 NOTE — Telephone Encounter (Signed)
NOTES ON FILE FROM [facility] [phone], SENT REFERRAL TO SCHEDULING 

## 2021-02-20 NOTE — Progress Notes (Signed)
  Subjective:  Patient ID: Wayne Young, male    DOB: [birth year],  MRN: [id number]  Chief Complaint  Patient presents with  . Wound Check    Interdigital 3rd wound check. Pt denies fever/chills/nausea/vomiting. No new concerns.     65 y.o. male presents for wound care. Hx confirmed with patient.  Thinks the wound is doing okay date of injury 01/11/2021. Objective:  Physical Exam: Wound Location: Right third toe Wound Measurement: Healing laceration with granular wound base Peri-wound: Normal Exudate: Scant/small amount Serosanguinous exudate wound without warmth, erythema, signs of acute infection  Assessment:   1. Laceration of lesser toe of left foot without foreign body present or damage to nail, subsequent encounter      Plan:  Patient was evaluated and treated and all questions answered.  Laceration right 3rd toe -Appears to be healing well.  Dressed with antibiotic ointment and Band-Aid.  Continue to do so daily.  Return in about 3 weeks (around 02/28/2021) for Wound f/u.

## 2021-02-28 ENCOUNTER — Ambulatory Visit (INDEPENDENT_AMBULATORY_CARE_PROVIDER_SITE_OTHER): Payer: Medicare HMO | Admitting: Podiatry

## 2021-02-28 DIAGNOSIS — Z5329 Procedure and treatment not carried out because of patient's decision for other reasons: Secondary | ICD-10-CM

## 2021-02-28 NOTE — Progress Notes (Signed)
No show for appt. 

## 2021-03-05 ENCOUNTER — Telehealth: Payer: Self-pay

## 2021-03-05 NOTE — Telephone Encounter (Signed)
Spoke with patient & patient's wife Wayne Young and scheduled an in-person Palliative Consult for 04-16-2021 @ 9AM  COVID screening was negative. No pets in home. Patient lives with wife, daughter and son.  Consent obtained; updated Outlook/Netsmart/Team List and Epic.   Family is aware they may be receiving a call from NP the day before or day of to confirm appointment.

## 2021-03-29 ENCOUNTER — Inpatient Hospital Stay (HOSPITAL_COMMUNITY)
Admission: EM | Admit: 2021-03-29 | Discharge: 2021-04-18 | DRG: 870 | Disposition: E | Payer: Medicare HMO | Attending: Internal Medicine | Admitting: Internal Medicine

## 2021-03-29 ENCOUNTER — Inpatient Hospital Stay (HOSPITAL_COMMUNITY): Payer: Medicare HMO

## 2021-03-29 ENCOUNTER — Other Ambulatory Visit: Payer: Self-pay

## 2021-03-29 ENCOUNTER — Emergency Department (HOSPITAL_COMMUNITY): Payer: Medicare HMO

## 2021-03-29 DIAGNOSIS — E1169 Type 2 diabetes mellitus with other specified complication: Secondary | ICD-10-CM | POA: Diagnosis not present

## 2021-03-29 DIAGNOSIS — H5461 Unqualified visual loss, right eye, normal vision left eye: Secondary | ICD-10-CM | POA: Diagnosis present

## 2021-03-29 DIAGNOSIS — R627 Adult failure to thrive: Secondary | ICD-10-CM | POA: Diagnosis present

## 2021-03-29 DIAGNOSIS — H409 Unspecified glaucoma: Secondary | ICD-10-CM | POA: Diagnosis present

## 2021-03-29 DIAGNOSIS — A4152 Sepsis due to Pseudomonas: Principal | ICD-10-CM | POA: Diagnosis present

## 2021-03-29 DIAGNOSIS — R001 Bradycardia, unspecified: Secondary | ICD-10-CM | POA: Diagnosis present

## 2021-03-29 DIAGNOSIS — Z9911 Dependence on respirator [ventilator] status: Secondary | ICD-10-CM

## 2021-03-29 DIAGNOSIS — F419 Anxiety disorder, unspecified: Secondary | ICD-10-CM | POA: Diagnosis present

## 2021-03-29 DIAGNOSIS — Z930 Tracheostomy status: Secondary | ICD-10-CM

## 2021-03-29 DIAGNOSIS — J986 Disorders of diaphragm: Secondary | ICD-10-CM | POA: Diagnosis present

## 2021-03-29 DIAGNOSIS — J9621 Acute and chronic respiratory failure with hypoxia: Secondary | ICD-10-CM | POA: Diagnosis not present

## 2021-03-29 DIAGNOSIS — F32A Depression, unspecified: Secondary | ICD-10-CM | POA: Diagnosis present

## 2021-03-29 DIAGNOSIS — N390 Urinary tract infection, site not specified: Secondary | ICD-10-CM | POA: Diagnosis present

## 2021-03-29 DIAGNOSIS — G8250 Quadriplegia, unspecified: Secondary | ICD-10-CM | POA: Diagnosis present

## 2021-03-29 DIAGNOSIS — G4700 Insomnia, unspecified: Secondary | ICD-10-CM | POA: Diagnosis present

## 2021-03-29 DIAGNOSIS — I10 Essential (primary) hypertension: Secondary | ICD-10-CM | POA: Diagnosis present

## 2021-03-29 DIAGNOSIS — Z6825 Body mass index (BMI) 25.0-25.9, adult: Secondary | ICD-10-CM

## 2021-03-29 DIAGNOSIS — L8990 Pressure ulcer of unspecified site, unspecified stage: Secondary | ICD-10-CM | POA: Diagnosis present

## 2021-03-29 DIAGNOSIS — R0602 Shortness of breath: Secondary | ICD-10-CM

## 2021-03-29 DIAGNOSIS — Z20822 Contact with and (suspected) exposure to covid-19: Secondary | ICD-10-CM | POA: Diagnosis present

## 2021-03-29 DIAGNOSIS — J9601 Acute respiratory failure with hypoxia: Secondary | ICD-10-CM

## 2021-03-29 DIAGNOSIS — E878 Other disorders of electrolyte and fluid balance, not elsewhere classified: Secondary | ICD-10-CM | POA: Diagnosis present

## 2021-03-29 DIAGNOSIS — J151 Pneumonia due to Pseudomonas: Secondary | ICD-10-CM | POA: Diagnosis present

## 2021-03-29 DIAGNOSIS — R6520 Severe sepsis without septic shock: Secondary | ICD-10-CM | POA: Diagnosis present

## 2021-03-29 DIAGNOSIS — E785 Hyperlipidemia, unspecified: Secondary | ICD-10-CM | POA: Diagnosis present

## 2021-03-29 DIAGNOSIS — Z794 Long term (current) use of insulin: Secondary | ICD-10-CM

## 2021-03-29 DIAGNOSIS — E1165 Type 2 diabetes mellitus with hyperglycemia: Secondary | ICD-10-CM | POA: Diagnosis present

## 2021-03-29 DIAGNOSIS — E43 Unspecified severe protein-calorie malnutrition: Secondary | ICD-10-CM | POA: Diagnosis present

## 2021-03-29 DIAGNOSIS — G1221 Amyotrophic lateral sclerosis: Secondary | ICD-10-CM | POA: Diagnosis not present

## 2021-03-29 DIAGNOSIS — J189 Pneumonia, unspecified organism: Secondary | ICD-10-CM | POA: Diagnosis not present

## 2021-03-29 DIAGNOSIS — R1310 Dysphagia, unspecified: Secondary | ICD-10-CM | POA: Diagnosis present

## 2021-03-29 DIAGNOSIS — I6389 Other cerebral infarction: Secondary | ICD-10-CM | POA: Diagnosis not present

## 2021-03-29 DIAGNOSIS — E872 Acidosis: Secondary | ICD-10-CM | POA: Diagnosis present

## 2021-03-29 DIAGNOSIS — M059 Rheumatoid arthritis with rheumatoid factor, unspecified: Secondary | ICD-10-CM | POA: Diagnosis present

## 2021-03-29 DIAGNOSIS — T501X5A Adverse effect of loop [high-ceiling] diuretics, initial encounter: Secondary | ICD-10-CM | POA: Diagnosis present

## 2021-03-29 DIAGNOSIS — Z825 Family history of asthma and other chronic lower respiratory diseases: Secondary | ICD-10-CM

## 2021-03-29 DIAGNOSIS — E876 Hypokalemia: Secondary | ICD-10-CM | POA: Diagnosis not present

## 2021-03-29 DIAGNOSIS — E8809 Other disorders of plasma-protein metabolism, not elsewhere classified: Secondary | ICD-10-CM | POA: Diagnosis present

## 2021-03-29 DIAGNOSIS — Z993 Dependence on wheelchair: Secondary | ICD-10-CM

## 2021-03-29 DIAGNOSIS — H04123 Dry eye syndrome of bilateral lacrimal glands: Secondary | ICD-10-CM | POA: Diagnosis not present

## 2021-03-29 DIAGNOSIS — I63322 Cerebral infarction due to thrombosis of left anterior cerebral artery: Secondary | ICD-10-CM | POA: Diagnosis present

## 2021-03-29 DIAGNOSIS — R21 Rash and other nonspecific skin eruption: Secondary | ICD-10-CM | POA: Diagnosis not present

## 2021-03-29 DIAGNOSIS — E870 Hyperosmolality and hypernatremia: Secondary | ICD-10-CM | POA: Diagnosis not present

## 2021-03-29 DIAGNOSIS — Z515 Encounter for palliative care: Secondary | ICD-10-CM

## 2021-03-29 DIAGNOSIS — I6330 Cerebral infarction due to thrombosis of unspecified cerebral artery: Secondary | ICD-10-CM | POA: Diagnosis not present

## 2021-03-29 DIAGNOSIS — E11319 Type 2 diabetes mellitus with unspecified diabetic retinopathy without macular edema: Secondary | ICD-10-CM | POA: Diagnosis present

## 2021-03-29 DIAGNOSIS — Z66 Do not resuscitate: Secondary | ICD-10-CM | POA: Diagnosis not present

## 2021-03-29 DIAGNOSIS — Z8611 Personal history of tuberculosis: Secondary | ICD-10-CM | POA: Diagnosis not present

## 2021-03-29 DIAGNOSIS — Z7189 Other specified counseling: Secondary | ICD-10-CM | POA: Diagnosis not present

## 2021-03-29 DIAGNOSIS — Z87891 Personal history of nicotine dependence: Secondary | ICD-10-CM

## 2021-03-29 DIAGNOSIS — E119 Type 2 diabetes mellitus without complications: Secondary | ICD-10-CM

## 2021-03-29 DIAGNOSIS — J9811 Atelectasis: Secondary | ICD-10-CM

## 2021-03-29 DIAGNOSIS — Z7984 Long term (current) use of oral hypoglycemic drugs: Secondary | ICD-10-CM

## 2021-03-29 DIAGNOSIS — R609 Edema, unspecified: Secondary | ICD-10-CM | POA: Diagnosis not present

## 2021-03-29 DIAGNOSIS — T17998A Other foreign object in respiratory tract, part unspecified causing other injury, initial encounter: Secondary | ICD-10-CM | POA: Diagnosis present

## 2021-03-29 DIAGNOSIS — Z5309 Procedure and treatment not carried out because of other contraindication: Secondary | ICD-10-CM

## 2021-03-29 DIAGNOSIS — L538 Other specified erythematous conditions: Secondary | ICD-10-CM | POA: Diagnosis not present

## 2021-03-29 DIAGNOSIS — R601 Generalized edema: Secondary | ICD-10-CM | POA: Diagnosis present

## 2021-03-29 DIAGNOSIS — J690 Pneumonitis due to inhalation of food and vomit: Secondary | ICD-10-CM | POA: Diagnosis present

## 2021-03-29 DIAGNOSIS — L89151 Pressure ulcer of sacral region, stage 1: Secondary | ICD-10-CM | POA: Diagnosis present

## 2021-03-29 DIAGNOSIS — Z79899 Other long term (current) drug therapy: Secondary | ICD-10-CM

## 2021-03-29 LAB — URINALYSIS, ROUTINE W REFLEX MICROSCOPIC
Bilirubin Urine: NEGATIVE
Glucose, UA: >=500 mg/dL — AB
Ketones, ur: NEGATIVE mg/dL
Nitrite: NEGATIVE
Protein, ur: 100 mg/dL — AB
Specific Gravity, Urine: 1.009 (ref 1.005–1.030)
WBC, UA: >50 WBC/hpf — ABNORMAL HIGH (ref 0–5)
pH: 7 (ref 5.0–8.0)

## 2021-03-29 LAB — COMPREHENSIVE METABOLIC PANEL
ALT: 14 U/L (ref 0–44)
AST: 16 U/L (ref 15–41)
Albumin: 3.1 g/dL — ABNORMAL LOW (ref 3.5–5.0)
Alkaline Phosphatase: 84 U/L (ref 38–126)
Anion gap: 12 (ref 5–15)
BUN: 7 mg/dL — ABNORMAL LOW (ref 8–23)
CO2: 24 mmol/L (ref 22–32)
Calcium: 9.2 mg/dL (ref 8.9–10.3)
Chloride: 100 mmol/L (ref 98–111)
Creatinine, Ser: 0.32 mg/dL — ABNORMAL LOW (ref 0.61–1.24)
GFR, Estimated: >60 mL/min (ref >60)
Glucose, Bld: 84 mg/dL (ref 70–99)
Potassium: 2.8 mmol/L — ABNORMAL LOW (ref 3.5–5.1)
Sodium: 136 mmol/L (ref 135–145)
Total Bilirubin: 1.2 mg/dL (ref 0.3–1.2)
Total Protein: 8.1 g/dL (ref 6.5–8.1)

## 2021-03-29 LAB — I-STAT ARTERIAL BLOOD GAS, ED
Acid-Base Excess: 3 mmol/L — ABNORMAL HIGH (ref 0.0–2.0)
Bicarbonate: 26.2 mmol/L (ref 20.0–28.0)
Calcium, Ion: 1.21 mmol/L (ref 1.15–1.40)
HCT: 39 % (ref 39.0–52.0)
Hemoglobin: 13.3 g/dL (ref 13.0–17.0)
O2 Saturation: 98 %
Patient temperature: 98.5
Potassium: 2.3 mmol/L — CL (ref 3.5–5.1)
Sodium: 141 mmol/L (ref 135–145)
TCO2: 27 mmol/L (ref 22–32)
pCO2 arterial: 36.1 mmHg (ref 32.0–48.0)
pH, Arterial: 7.469 — ABNORMAL HIGH (ref 7.350–7.450)
pO2, Arterial: 91 mmHg (ref 83.0–108.0)

## 2021-03-29 LAB — CBC WITH DIFFERENTIAL/PLATELET
Abs Immature Granulocytes: 0.08 10*3/uL — ABNORMAL HIGH (ref 0.00–0.07)
Basophils Absolute: 0 10*3/uL (ref 0.0–0.1)
Basophils Relative: 0 %
Eosinophils Absolute: 0 10*3/uL (ref 0.0–0.5)
Eosinophils Relative: 0 %
HCT: 45.5 % (ref 39.0–52.0)
Hemoglobin: 14.3 g/dL (ref 13.0–17.0)
Immature Granulocytes: 1 %
Lymphocytes Relative: 8 %
Lymphs Abs: 1.3 10*3/uL (ref 0.7–4.0)
MCH: 25.9 pg — ABNORMAL LOW (ref 26.0–34.0)
MCHC: 31.4 g/dL (ref 30.0–36.0)
MCV: 82.3 fL (ref 80.0–100.0)
Monocytes Absolute: 0.8 10*3/uL (ref 0.1–1.0)
Monocytes Relative: 5 %
Neutro Abs: 14.8 10*3/uL — ABNORMAL HIGH (ref 1.7–7.7)
Neutrophils Relative %: 86 %
Platelets: 278 10*3/uL (ref 150–400)
RBC: 5.53 MIL/uL (ref 4.22–5.81)
RDW: 16.2 % — ABNORMAL HIGH (ref 11.5–15.5)
WBC: 17 10*3/uL — ABNORMAL HIGH (ref 4.0–10.5)
nRBC: 0 % (ref 0.0–0.2)

## 2021-03-29 LAB — I-STAT CHEM 8, ED
BUN: 10 mg/dL (ref 8–23)
Calcium, Ion: 1.02 mmol/L — ABNORMAL LOW (ref 1.15–1.40)
Chloride: 103 mmol/L (ref 98–111)
Creatinine, Ser: <0.2 mg/dL — ABNORMAL LOW (ref 0.61–1.24)
Glucose, Bld: 84 mg/dL (ref 70–99)
HCT: 46 % (ref 39.0–52.0)
Hemoglobin: 15.6 g/dL (ref 13.0–17.0)
Potassium: 3.8 mmol/L (ref 3.5–5.1)
Sodium: 137 mmol/L (ref 135–145)
TCO2: 26 mmol/L (ref 22–32)

## 2021-03-29 LAB — HIV ANTIBODY (ROUTINE TESTING W REFLEX): HIV Screen 4th Generation wRfx: NONREACTIVE

## 2021-03-29 LAB — RESP PANEL BY RT-PCR (FLU A&B, COVID) ARPGX2
Influenza A by PCR: NEGATIVE
Influenza B by PCR: NEGATIVE
SARS Coronavirus 2 by RT PCR: NEGATIVE

## 2021-03-29 LAB — AMMONIA: Ammonia: 11 umol/L (ref 9–35)

## 2021-03-29 LAB — BRAIN NATRIURETIC PEPTIDE: B Natriuretic Peptide: 65.8 pg/mL (ref 0.0–100.0)

## 2021-03-29 LAB — PROCALCITONIN: Procalcitonin: 1.17 ng/mL

## 2021-03-29 LAB — EXPECTORATED SPUTUM ASSESSMENT W GRAM STAIN, RFLX TO RESP C

## 2021-03-29 LAB — GLUCOSE, CAPILLARY
Glucose-Capillary: 121 mg/dL — ABNORMAL HIGH (ref 70–99)
Glucose-Capillary: 103 mg/dL — ABNORMAL HIGH (ref 70–99)

## 2021-03-29 LAB — APTT: aPTT: 23 seconds — ABNORMAL LOW (ref 24–36)

## 2021-03-29 LAB — CBG MONITORING, ED: Glucose-Capillary: 88 mg/dL (ref 70–99)

## 2021-03-29 LAB — STREP PNEUMONIAE URINARY ANTIGEN: Strep Pneumo Urinary Antigen: NEGATIVE

## 2021-03-29 LAB — MAGNESIUM: Magnesium: 1.9 mg/dL (ref 1.7–2.4)

## 2021-03-29 LAB — PROTIME-INR
INR: 1.3 — ABNORMAL HIGH (ref 0.8–1.2)
Prothrombin Time: 16 seconds — ABNORMAL HIGH (ref 11.4–15.2)

## 2021-03-29 LAB — MRSA NEXT GEN BY PCR, NASAL: MRSA by PCR Next Gen: NOT DETECTED

## 2021-03-29 LAB — LACTIC ACID, PLASMA: Lactic Acid, Venous: 1.6 mmol/L (ref 0.5–1.9)

## 2021-03-29 MED ORDER — POTASSIUM CHLORIDE 20 MEQ PO PACK
20.0000 meq | PACK | Freq: Two times a day (BID) | ORAL | Status: DC
  Administered 2021-03-29 – 2021-03-30 (×3): 20 meq via ORAL
  Filled 2021-03-29 (×3): qty 1

## 2021-03-29 MED ORDER — CHLORHEXIDINE GLUCONATE 0.12% ORAL RINSE (MEDLINE KIT)
15.0000 mL | Freq: Two times a day (BID) | OROMUCOSAL | Status: DC
  Administered 2021-03-29 – 2021-04-11 (×25): 15 mL via OROMUCOSAL

## 2021-03-29 MED ORDER — IPRATROPIUM-ALBUTEROL 0.5-2.5 (3) MG/3ML IN SOLN
3.0000 mL | Freq: Three times a day (TID) | RESPIRATORY_TRACT | Status: DC
  Administered 2021-03-29 – 2021-03-30 (×5): 3 mL via RESPIRATORY_TRACT
  Filled 2021-03-29 (×4): qty 3

## 2021-03-29 MED ORDER — ACETAMINOPHEN 650 MG RE SUPP
650.0000 mg | Freq: Once | RECTAL | Status: AC
  Administered 2021-03-29: 650 mg via RECTAL
  Filled 2021-03-29: qty 1

## 2021-03-29 MED ORDER — POLYVINYL ALCOHOL 1.4 % OP SOLN
1.0000 [drp] | OPHTHALMIC | Status: DC | PRN
  Administered 2021-03-29 – 2021-03-30 (×2): 1 [drp] via OPHTHALMIC
  Filled 2021-03-29: qty 15

## 2021-03-29 MED ORDER — PAROXETINE HCL 20 MG PO TABS
20.0000 mg | ORAL_TABLET | Freq: Every morning | ORAL | Status: DC
  Administered 2021-03-29 – 2021-03-30 (×2): 20 mg via ORAL
  Filled 2021-03-29 (×4): qty 1

## 2021-03-29 MED ORDER — INSULIN ASPART 100 UNIT/ML IJ SOLN
0.0000 [IU] | Freq: Three times a day (TID) | INTRAMUSCULAR | Status: DC
  Administered 2021-03-29: 1 [IU] via SUBCUTANEOUS

## 2021-03-29 MED ORDER — HYDROXYZINE HCL 25 MG PO TABS: 25.0000 mg | ORAL_TABLET | Freq: Every evening | ORAL | Status: DC | PRN

## 2021-03-29 MED ORDER — LACTATED RINGERS IV BOLUS (SEPSIS)
500.0000 mL | Freq: Once | INTRAVENOUS | Status: AC
  Administered 2021-03-29: 500 mL via INTRAVENOUS

## 2021-03-29 MED ORDER — SODIUM CHLORIDE 0.9 % IV SOLN
2.0000 g | Freq: Once | INTRAVENOUS | Status: AC
  Administered 2021-03-29: 2 g via INTRAVENOUS
  Filled 2021-03-29: qty 2

## 2021-03-29 MED ORDER — HYDRALAZINE HCL 20 MG/ML IJ SOLN
10.0000 mg | INTRAMUSCULAR | Status: DC | PRN
  Administered 2021-03-29: 10 mg via INTRAVENOUS
  Filled 2021-03-29: qty 1

## 2021-03-29 MED ORDER — POTASSIUM CHLORIDE 20 MEQ PO PACK: 20.0000 meq | PACK | Freq: Two times a day (BID) | ORAL | Status: DC

## 2021-03-29 MED ORDER — LACTULOSE 10 GM/15ML PO SOLN
20.0000 g | Freq: Every day | ORAL | Status: DC
  Administered 2021-03-30: 20 g via ORAL
  Filled 2021-03-29: qty 30

## 2021-03-29 MED ORDER — FUROSEMIDE 20 MG PO TABS
20.0000 mg | ORAL_TABLET | Freq: Every day | ORAL | Status: DC
  Administered 2021-03-29 – 2021-03-30 (×2): 20 mg via ORAL
  Filled 2021-03-29 (×2): qty 1

## 2021-03-29 MED ORDER — LACTATED RINGERS IV SOLN: INTRAVENOUS | Status: AC

## 2021-03-29 MED ORDER — VANCOMYCIN HCL 1250 MG/250ML IV SOLN
1250.0000 mg | Freq: Once | INTRAVENOUS | Status: AC
  Administered 2021-03-29: 1250 mg via INTRAVENOUS
  Filled 2021-03-29: qty 250

## 2021-03-29 MED ORDER — ACETAMINOPHEN 650 MG RE SUPP: 650.0000 mg | Freq: Four times a day (QID) | RECTAL | Status: DC | PRN

## 2021-03-29 MED ORDER — INSULIN ASPART 100 UNIT/ML IJ SOLN: 0.0000 [IU] | Freq: Every day | INTRAMUSCULAR | Status: DC

## 2021-03-29 MED ORDER — ORAL CARE MOUTH RINSE
15.0000 mL | OROMUCOSAL | Status: DC
  Administered 2021-03-30 – 2021-04-11 (×112): 15 mL via OROMUCOSAL

## 2021-03-29 MED ORDER — ACETAMINOPHEN 325 MG PO TABS
650.0000 mg | ORAL_TABLET | Freq: Four times a day (QID) | ORAL | Status: DC | PRN
  Administered 2021-04-05 – 2021-04-10 (×3): 650 mg
  Filled 2021-03-29 (×3): qty 2

## 2021-03-29 MED ORDER — SODIUM CHLORIDE 3 % IN NEBU
4.0000 mL | INHALATION_SOLUTION | Freq: Two times a day (BID) | RESPIRATORY_TRACT | Status: DC
  Administered 2021-03-29 – 2021-03-30 (×3): 4 mL via RESPIRATORY_TRACT
  Filled 2021-03-29 (×5): qty 4

## 2021-03-29 MED ORDER — HYDRALAZINE HCL 20 MG/ML IJ SOLN
10.0000 mg | INTRAMUSCULAR | Status: DC | PRN
  Administered 2021-03-29: 10 mg via INTRAVENOUS
  Administered 2021-03-29 – 2021-03-30 (×2): 20 mg via INTRAVENOUS
  Administered 2021-03-31: 10 mg via INTRAVENOUS
  Filled 2021-03-29 (×5): qty 1

## 2021-03-29 MED ORDER — VANCOMYCIN HCL 1000 MG/200ML IV SOLN: 1000.0000 mg | Freq: Once | INTRAVENOUS | Status: DC

## 2021-03-29 MED ORDER — POTASSIUM CHLORIDE CRYS ER 20 MEQ PO TBCR
60.0000 meq | EXTENDED_RELEASE_TABLET | ORAL | Status: AC
  Administered 2021-03-29: 60 meq via ORAL
  Filled 2021-03-29: qty 3

## 2021-03-29 MED ORDER — CHLORHEXIDINE GLUCONATE CLOTH 2 % EX PADS
6.0000 | MEDICATED_PAD | Freq: Every day | CUTANEOUS | Status: DC
  Administered 2021-03-29 – 2021-04-05 (×8): 6 via TOPICAL

## 2021-03-29 MED ORDER — FOLIC ACID 1 MG PO TABS
1.0000 mg | ORAL_TABLET | Freq: Every day | ORAL | Status: DC
  Administered 2021-03-29 – 2021-03-30 (×2): 1 mg via ORAL
  Filled 2021-03-29 (×3): qty 1

## 2021-03-29 MED ORDER — BRIMONIDINE TARTRATE 0.2 % OP SOLN
1.0000 [drp] | Freq: Three times a day (TID) | OPHTHALMIC | Status: DC
  Administered 2021-03-30 – 2021-03-31 (×2): 1 [drp] via OPHTHALMIC
  Filled 2021-03-29 (×2): qty 5

## 2021-03-29 MED ORDER — CARVEDILOL 6.25 MG PO TABS
6.2500 mg | ORAL_TABLET | Freq: Two times a day (BID) | ORAL | Status: DC
  Administered 2021-03-29 – 2021-03-31 (×4): 6.25 mg via ORAL
  Filled 2021-03-29 (×4): qty 1

## 2021-03-29 MED ORDER — LATANOPROST 0.005 % OP SOLN
1.0000 [drp] | Freq: Every day | OPHTHALMIC | Status: DC
  Administered 2021-03-29 – 2021-04-10 (×13): 1 [drp] via OPHTHALMIC
  Filled 2021-03-29: qty 2.5

## 2021-03-29 MED ORDER — CARBOXYMETHYLCELLULOSE SODIUM 0.5 % OP SOLN: 1.0000 [drp] | OPHTHALMIC | Status: DC | PRN

## 2021-03-29 MED ORDER — LORAZEPAM 0.5 MG PO TABS
0.5000 mg | ORAL_TABLET | Freq: Four times a day (QID) | ORAL | Status: DC | PRN
  Administered 2021-03-29 – 2021-03-30 (×2): 0.5 mg via ORAL
  Filled 2021-03-29 (×2): qty 1

## 2021-03-29 MED ORDER — FAMOTIDINE 40 MG/5ML PO SUSR
20.0000 mg | Freq: Two times a day (BID) | ORAL | Status: DC
  Administered 2021-03-29 – 2021-03-30 (×4): 20 mg via ORAL
  Filled 2021-03-29 (×5): qty 2.5

## 2021-03-29 MED ORDER — LOSARTAN POTASSIUM 25 MG PO TABS
25.0000 mg | ORAL_TABLET | Freq: Every day | ORAL | Status: DC
  Administered 2021-03-30: 25 mg via ORAL
  Filled 2021-03-29 (×2): qty 1

## 2021-03-29 MED ORDER — SODIUM CHLORIDE 0.9 % IV SOLN
2.0000 g | Freq: Three times a day (TID) | INTRAVENOUS | Status: DC
  Administered 2021-03-29 – 2021-03-31 (×7): 2 g via INTRAVENOUS
  Filled 2021-03-29 (×7): qty 2

## 2021-03-29 MED ORDER — GUAIFENESIN 200 MG PO TABS
400.0000 mg | ORAL_TABLET | Freq: Two times a day (BID) | ORAL | Status: DC | PRN
  Filled 2021-03-29: qty 2

## 2021-03-29 MED ORDER — LABETALOL HCL 5 MG/ML IV SOLN: 5.0000 mg | INTRAVENOUS | Status: DC | PRN

## 2021-03-29 MED ORDER — LABETALOL HCL 5 MG/ML IV SOLN
5.0000 mg | INTRAVENOUS | Status: DC | PRN
  Administered 2021-03-29 – 2021-04-03 (×8): 5 mg via INTRAVENOUS
  Filled 2021-03-29 (×8): qty 4

## 2021-03-29 MED ORDER — LEVALBUTEROL HCL 1.25 MG/0.5ML IN NEBU: 1.2500 mg | INHALATION_SOLUTION | Freq: Four times a day (QID) | RESPIRATORY_TRACT | Status: DC

## 2021-03-29 MED ORDER — ENOXAPARIN SODIUM 40 MG/0.4ML IJ SOSY
40.0000 mg | PREFILLED_SYRINGE | INTRAMUSCULAR | Status: DC
  Administered 2021-03-29 – 2021-04-01 (×4): 40 mg via SUBCUTANEOUS
  Filled 2021-03-29 (×4): qty 0.4

## 2021-03-29 MED ORDER — VANCOMYCIN HCL 1000 MG/200ML IV SOLN: 1000.0000 mg | Freq: Two times a day (BID) | INTRAVENOUS | Status: DC

## 2021-03-29 MED ORDER — VANCOMYCIN HCL 1500 MG/300ML IV SOLN
1500.0000 mg | Freq: Three times a day (TID) | INTRAVENOUS | Status: DC
  Filled 2021-03-29: qty 300

## 2021-03-29 MED ORDER — ATORVASTATIN CALCIUM 10 MG PO TABS
20.0000 mg | ORAL_TABLET | Freq: Every evening | ORAL | Status: DC
  Administered 2021-03-29 – 2021-03-30 (×2): 20 mg via ORAL
  Filled 2021-03-29 (×2): qty 2

## 2021-03-29 MED ORDER — DORZOLAMIDE HCL-TIMOLOL MAL 2-0.5 % OP SOLN
1.0000 [drp] | Freq: Three times a day (TID) | OPHTHALMIC | Status: DC
  Filled 2021-03-29: qty 10

## 2021-03-29 MED ORDER — NYSTATIN 100000 UNIT/GM EX CREA
1.0000 "application " | TOPICAL_CREAM | Freq: Three times a day (TID) | CUTANEOUS | Status: DC
  Filled 2021-03-29: qty 15

## 2021-03-29 NOTE — Progress Notes (Signed)
Patient transported on vent to 2H23 without complications.

## 2021-03-29 NOTE — Progress Notes (Signed)
Patient transported on vent to CT and returned to ED04 without complications.

## 2021-03-29 NOTE — ED Notes (Signed)
MD at bedside now attempting to gain IV access using ultrasound.

## 2021-03-29 NOTE — ED Notes (Signed)
Attempted to call report. RN not available. Extension given.

## 2021-03-29 NOTE — ED Notes (Signed)
Patient transported to CT 

## 2021-03-29 NOTE — ED Notes (Signed)
IV team attempted to gain IV access but unsuccessful two times. Was only able to get enough blood for an I-stat. Pt refused for IV team to try again. MD made aware.

## 2021-03-29 NOTE — Progress Notes (Signed)
Critical ABG values give to S. Johna Roles, Charity fundraiser.

## 2021-03-29 NOTE — ED Notes (Signed)
Due to difficulty obtaining access on pt, only 1 set of blood cultures able to be obtained at this time.

## 2021-03-29 NOTE — ED Notes (Signed)
MD attempting ultrasound IV.

## 2021-03-29 NOTE — H&P (Addendum)
History and Physical    Wayne Young LOV:[id number] DOB: 12-31-1955 DOA: 03/24/2021  Referring MD/NP/PA: Gean Birchwood, MD PCP: Cipriano Mile, NP  Patient coming from: Home via EMS  Chief Complaint: Low oxygen level  I have personally briefly reviewed patient's old medical records in [place]   HPI: Wayne Young is a 65 y.o. male with medical history significant of ALS s/p tracheostomy on home ventilator, hypertension, diabetes mellitus type 2, rheumatoid arthritis, remote history of TB, and glaucoma presents after being noted to have low oxygen saturations at home.  History is mostly obtained from the patient's wife over the phone.  Patient has a home health nurse and helps provide care for the patient.  At baseline patient is able to move his head in certain positions talk, and eats.  He cannot move his hands or legs and therefore needs assistance with feedings and all other activities of daily living.  He had been having increased thick secretions over the last 2 to 3 days and they have been suctioning him frequently.  He was reported to have associated symptoms of headache, chills, sleeping more than usual, and swelling was reported to have been improving.  This morning however patient's O2 saturations were noted to be down in the 70s.  They tried giving the patient a breathing treatment and suctioning and it temporarily improved to the 80s but subsequently went back down for which they called EMS.  In route with EMS patient was noted to have O2 saturations in 80s was increased to the 90s on 6 L.  Wife notes that the patient is supposed to have his trach changed out on 6/26 and he has had similar increasing creations prior to needing his trach changed out previously.  Overall he has been swelling in his hands and legs, but has been on a fluid restriction and is on diuretics with family noting some improvement in symptoms.  Records note patient had been hospitalized back in 2019  and 2020 with a left-sided pneumonia.  ED Course: Upon admission into the emergency department patient was seen to be febrile up to 101 F, pulse 88-1 06, respirations 16-22, blood pressures maintained, and on home ventilator 44% FiO2.  Labs significant for WBC 17, potassium 2.8, INR 1.3, and lactic acid 1.6.  CT scan of the chest revealed masslike component opacities of the left lung concerning for infection/pneumonia, but a underlying mass was not ruled out.  Patient had been given Tylenol, 500 mL bolus of lactated Ringer's, DuoNebs, vancomycin, and cefepime.  Review of Systems  Unable to perform ROS: Medical condition  Constitutional:  Positive for chills.  Cardiovascular:  Positive for leg swelling.  Neurological:  Positive for headaches. Negative for seizures.   Past Medical History:  Diagnosis Date   Arthritis    Diabetes mellitus without complication ([place])    Glaucoma    Hypertension     Past Surgical History:  Procedure Laterality Date   HERNIA REPAIR       reports that he has quit smoking. He has never used smokeless tobacco. He reports that he does not drink alcohol and does not use drugs.  No Known Allergies  Family History  Problem Relation Age of Onset   Asthma Mother     Prior to Admission medications   Medication Sig Start Date End Date Taking? Authorizing Provider  acetaminophen (TYLENOL) 325 MG tablet Place 2 tablets (650 mg total) into feeding tube every 6 (six) hours as needed for mild pain, fever  or headache. 08/20/18  Yes Omar Person, NP  albuterol (PROVENTIL) (2.5 MG/3ML) 0.083% nebulizer solution Take 3 mLs (2.5 mg total) by nebulization every 2 (two) hours as needed for wheezing or shortness of breath. Patient taking differently: Take 2.5 mg by nebulization 2 (two) times daily as needed for wheezing or shortness of breath. 08/20/18  Yes Omar Person, NP  atorvastatin (LIPITOR) 20 MG tablet Take 20 mg by mouth every evening. 08/16/20  Yes  [provider]  brimonidine (ALPHAGAN) 0.2 % ophthalmic solution Place 2 drops into both eyes 3 (three) times daily. Patient taking differently: Place 1 drop into both eyes 3 (three) times daily. 08/21/18  Yes Omar Person, NP  Carboxymethylcellulose Sodium (REFRESH TEARS OP) Place 1 drop into both eyes as needed (comfort).   Yes [provider]  carvedilol (COREG) 6.25 MG tablet Take 6.25 mg by mouth 2 (two) times daily with a meal.   Yes [provider]  DROPLET INSULIN SYRINGE 31G X 5/16" 1 ML MISC  09/05/20  Yes [provider]  empagliflozin (JARDIANCE) 10 MG TABS tablet Take 10 mg by mouth daily.   Yes [provider]  famotidine (PEPCID) 40 MG/5ML suspension Place 2.5 mLs (20 mg total) into feeding tube 2 (two) times daily. Patient taking differently: Take 20 mg by mouth 2 (two) times daily. 08/21/18  Yes Omar Person, NP  folic acid (FOLVITE) 1 MG tablet Take 1 tablet by mouth daily. 10/31/20  Yes [provider]  furosemide (LASIX) 20 MG tablet Take 20 mg by mouth daily. 11/25/20  Yes [provider]  glipiZIDE (GLUCOTROL) 10 MG tablet Take 10 mg by mouth daily before breakfast.   Yes [provider]  guaifenesin (HUMIBID E) 400 MG TABS tablet Take 400 mg by mouth 2 (two) times daily as needed (heavy,copious or thick secretions).   Yes [provider]  hydrOXYzine (ATARAX/VISTARIL) 25 MG tablet Take 25 mg by mouth at bedtime as needed for anxiety or vomiting. 04/29/20  Yes [provider]  insulin aspart (NOVOLOG) 100 UNIT/ML injection Inject 2-10 Units into the skin See admin instructions. Qid per sliding scale 150-199= 2 units 200-249= 4 units 250-299 = 6 units 300-349= 8 units >350 = 10 units   Yes [provider]  insulin regular (NOVOLIN R) 100 units/mL injection Inject 10 Units into the skin See admin instructions. 10 units tid with meals(hold if bs <150) Qid with meals and  at bedtime with base dose.   Yes [provider]  lactulose (CHRONULAC) 10 GM/15ML solution Take 20 g by mouth daily. 30 ml   Yes [provider]  latanoprost (XALATAN) 0.005 % ophthalmic solution Place 1 drop into both eyes at bedtime.   Yes [provider]  LORazepam (ATIVAN) 0.5 MG tablet Take 0.5 mg by mouth every 6 (six) hours as needed for anxiety (panic attack).   Yes [provider]  losartan (COZAAR) 25 MG tablet Take 25 mg by mouth daily.   Yes [provider]  methotrexate (RHEUMATREX) 2.5 MG tablet Take 17.5 mg by mouth every Thursday. 01/06/21  Yes [provider]  nystatin cream (MYCOSTATIN) Apply 1 application topically 3 (three) times daily. To fungal rash until clear   Yes [provider]  pantoprazole (PROTONIX) 20 MG tablet Take 20 mg by mouth 2 (two) times daily. 09/03/20  Yes [provider]  PARoxetine (PAXIL) 20 MG tablet Take 1 tablet by mouth every morning. 11/06/20  Yes  [provider]  potassium chloride 20 MEQ/15ML (10%) SOLN Take 20 mEq by mouth 2 (two) times daily. Dilute in 1/2 glass of water/juice 11/15/20  Yes [provider]  simethicone (MYLICON) 242 MG chewable tablet Chew 125 mg by mouth See admin instructions. Before meals and at bedtime x 10 days   Yes [provider]  [place] test strip  09/05/20   [provider]  amLODipine (NORVASC) 10 MG tablet Place 1 tablet (10 mg total) into feeding tube daily. Patient not taking: Reported on 03/23/2021 08/21/18   Omar Person, NP  Chlorhexidine Gluconate Cloth 2 % PADS Apply 6 each topically daily. Patient not taking: Reported on 03/19/2021 08/21/18   Omar Person, NP  chlorhexidine gluconate, MEDLINE KIT, (PERIDEX) 0.12 % solution 15 mLs by Mouth Rinse route 2 (two) times daily. Patient not taking: Reported on 03/30/2021 08/21/18   Omar Person, NP  docusate (COLACE) 50 MG/5ML liquid  Place 10 mLs (100 mg total) into feeding tube daily. Patient not taking: Reported on 04/06/2021 08/21/18   Omar Person, NP  dorzolamide-timolol (COSOPT) 22.3-6.8 MG/ML ophthalmic solution Place 1 drop into both eyes 3 (three) times daily.    [provider]  enoxaparin (LOVENOX) 40 MG/0.4ML injection Inject 0.4 mLs (40 mg total) into the skin daily. Patient not taking: Reported on 03/26/2021 08/21/18   Omar Person, NP  fentaNYL (SUBLIMAZE) 100 MCG/2ML injection Inject 0.5-2 mLs (25-100 mcg total) into the vein every 2 (two) hours as needed for severe pain. Patient not taking: Reported on 04/01/2021 08/20/18   Omar Person, NP  fluticasone (CUTIVATE) 0.05 % cream  08/01/20   [provider]  guaiFENesin (ROBITUSSIN) 100 MG/5ML SOLN Place 10 mLs (200 mg total) into feeding tube every 4 (four) hours. Patient not taking: Reported on 04/17/2021 08/21/18   Omar Person, NP  Hydrocortisone (GERHARDT'S BUTT CREAM) CREA Apply 1 application topically as needed for irritation (apply to buttocks, groin, areas of MASD). Patient not taking: Reported on 03/26/2021 08/20/18   Omar Person, NP  insulin aspart (NOVOLOG) 100 UNIT/ML injection Inject 0-9 Units into the skin every 4 (four) hours. Patient not taking: Reported on 04/14/2021 08/21/18   Omar Person, NP  insulin glargine (LANTUS) 100 UNIT/ML injection Inject 0.1 mLs (10 Units total) into the skin daily. Patient not taking: Reported on 03/20/2021 08/21/18   Omar Person, NP  ketoconazole (NIZORAL) 2 % cream APPLY 1 FINGERTIP AMOUNT TO EACH FOOT DAILY Patient not taking: Reported on 04/13/2021 06/10/20   Evelina Bucy, DPM  labetalol (NORMODYNE,TRANDATE) 5 MG/ML injection Inject 2 mLs (10 mg total) into the vein every 6 (six) hours as needed (systolic greater than 353). Patient not taking: Reported on 03/25/2021 08/20/18   Omar Person, NP  levalbuterol Penne Lash) 1.25 MG/0.5ML nebulizer solution  Take 1.25 mg by nebulization every 6 (six) hours. Patient not taking: Reported on 03/20/2021 08/21/18   Omar Person, NP  levETIRAcetam (KEPRRA) 500 MG/100ML SOLN Inject 100 mLs (500 mg total) into the vein every 12 (twelve) hours. Patient not taking: Reported on 04/08/2021 08/21/18   Omar Person, NP  LORazepam (ATIVAN) 2 MG/ML injection Inject 0.25 mLs (0.5 mg total) into the vein every 4 (four) hours as needed for anxiety. Patient not taking: No sig reported 08/20/18   Omar Person, NP  mouth rinse LIQD solution 15 mLs by Mouth Rinse route every 2 (two) hours. Patient not taking: Reported on  04/09/2021 08/20/18   Omar Person, NP  Nutritional Supplements (FEEDING SUPPLEMENT, VITAL AF 1.2 CAL,) LIQD Place 1,000 mLs into feeding tube continuous. Patient not taking: Reported on 03/25/2021 08/20/18   Omar Person, NP  ondansetron [facility]) 4 MG/2ML SOLN injection Inject 2 mLs (4 mg total) into the vein every 8 (eight) hours as needed for nausea or vomiting. Patient not taking: Reported on 04/06/2021 08/20/18   Omar Person, NP  scopolamine (TRANSDERM-SCOP) 1 MG/3DAYS Place 1 patch (1.5 mg total) onto the skin every 3 (three) days. Patient not taking: Reported on 03/25/2021 08/22/18   Omar Person, NP  sodium chloride 0.9 % infusion Inject 10 mLs into the vein continuous. Patient not taking: Reported on 03/21/2021 08/20/18   Omar Person, NP  sodium chloride flush (NS) 0.9 % SOLN 10-40 mLs by Intracatheter route every 12 (twelve) hours. Patient not taking: Reported on 04/02/2021 08/21/18   Omar Person, NP  sodium chloride HYPERTONIC 3 % nebulizer solution Take 4 mLs by nebulization 2 (two) times daily. Patient not taking: Reported on 04/15/2021 08/21/18   Omar Person, NP  spironolactone (ALDACTONE) 25 MG tablet Take 25 mg by mouth every other day. Patient not taking: Reported on 03/19/2021    [provider]    Physical  Exam:  Constitutional: Elderly male who appears to be lethargic but in no acute distress Vitals:   04/05/2021 0630 03/26/2021 0645 04/02/2021 0700 04/15/2021 0715  BP: (!) 161/68 (!) 176/74 (!) 177/73 (!) 162/70  Pulse: 90 88 90 92  Resp: 18 16 17 16   Temp:    98.5 F (36.9 C)  TempSrc:    Axillary  SpO2: 98% 97% 96% 95%  Weight:      Height:       Eyes: PERRL, lids and conjunctivae normal ENMT: Mucous membranes are moist. Posterior pharynx clear of any exudate or lesions. Neck:  tracheostomy in place Respiratory: Diminished breath sounds with some rales and crackles appreciated in the left lung field.  Right lung field sounds clear. Cardiovascular: Regular rate and rhythm, no murmurs / rubs / gallops.  +2 bilateral pitting lower extremity edema Abdomen: no tenderness, no masses palpated. No hepatosplenomegaly. Bowel sounds positive.  Musculoskeletal: no clubbing / cyanosis. No joint deformity upper and lower extremities. Good ROM, no contractures. Normal muscle tone.  Skin: no rashes, lesions, ulcers. No induration Neurologic: Patient unable to move arms or legs secondary to ALS Psychiatric: Normal judgment and insight.  Lethargic.  Oriented x 3. Normal mood.     Labs on Admission: I have personally reviewed following labs and imaging studies  CBC: Recent Labs  Lab 04/09/2021 0443 04/10/2021 0515  WBC  --  17.0*  NEUTROABS  --  14.8*  HGB 15.6 14.3  HCT 46.0 45.5  MCV  --  82.3  PLT  --  122   Basic Metabolic Panel: Recent Labs  Lab 03/31/2021 0443 03/28/2021 0515  NA 137 136  K 3.8 2.8*  CL 103 100  CO2  --  24  GLUCOSE 84 84  BUN 10 7*  CREATININE <0.20* 0.32*  CALCIUM  --  9.2   GFR: Estimated Creatinine Clearance: 86.2 mL/min (A) (by C-G formula based on SCr of 0.32 mg/dL (L)). Liver Function Tests: Recent Labs  Lab 03/20/2021 0515  AST 16  ALT 14  ALKPHOS 84  BILITOT 1.2  PROT 8.1  ALBUMIN 3.1*   No results for input(s): LIPASE, AMYLASE in the last 168  hours. No results for input(s): AMMONIA in the last 168 hours. Coagulation Profile: Recent Labs  Lab 04/14/2021 0515  INR 1.3*   Cardiac Enzymes: No results for input(s): CKTOTAL, CKMB, CKMBINDEX, TROPONINI in the last 168 hours. BNP (last 3 results) No results for input(s): PROBNP in the last 8760 hours. HbA1C: No results for input(s): HGBA1C in the last 72 hours. CBG: No results for input(s): GLUCAP in the last 168 hours. Lipid Profile: No results for input(s): CHOL, HDL, LDLCALC, TRIG, CHOLHDL, LDLDIRECT in the last 72 hours. Thyroid Function Tests: No results for input(s): TSH, T4TOTAL, FREET4, T3FREE, THYROIDAB in the last 72 hours. Anemia Panel: No results for input(s): VITAMINB12, FOLATE, FERRITIN, TIBC, IRON, RETICCTPCT in the last 72 hours. Urine analysis:    Component Value Date/Time   COLORURINE AMBER (A) 04/13/2021 0335   APPEARANCEUR CLOUDY (A) 04/14/2021 0335   LABSPEC 1.009 03/20/2021 0335   PHURINE 7.0 04/02/2021 0335   GLUCOSEU >=500 (A) 04/08/2021 0335   HGBUR MODERATE (A) 04/12/2021 0335   BILIRUBINUR NEGATIVE 03/28/2021 0335   KETONESUR NEGATIVE 03/27/2021 0335   PROTEINUR 100 (A) 04/14/2021 0335   NITRITE NEGATIVE 04/03/2021 0335   LEUKOCYTESUR LARGE (A) 04/07/2021 0335   Sepsis Labs: Recent Results (from the past 240 hour(s))  Resp Panel by RT-PCR (Flu A&B, Covid) Nasopharyngeal Swab     Status: None   Collection Time: 03/22/2021  3:52 AM   Specimen: Nasopharyngeal Swab; Nasopharyngeal(NP) swabs in vial transport medium  Result Value Ref Range Status   SARS Coronavirus 2 by RT PCR NEGATIVE NEGATIVE Final    Comment: (NOTE) SARS-CoV-2 target nucleic acids are NOT DETECTED.  The SARS-CoV-2 RNA is generally detectable in upper respiratory specimens during the acute phase of infection. The lowest concentration of SARS-CoV-2 viral copies this assay can detect is 138 copies/mL. A negative result does not preclude SARS-Cov-2 infection and should not be  used as the sole basis for treatment or other patient management decisions. A negative result may occur with  improper specimen collection/handling, submission of specimen other than nasopharyngeal swab, presence of viral mutation(s) within the areas targeted by this assay, and inadequate number of viral copies(<138 copies/mL). A negative result must be combined with clinical observations, patient history, and epidemiological information. The expected result is Negative.  Fact Sheet for Patients:  EntrepreneurPulse.com.au  Fact Sheet for Healthcare Providers:  IncredibleEmployment.be  This test is no t yet approved or cleared by the [place] FDA and  has been authorized for detection and/or diagnosis of SARS-CoV-2 by FDA under an Emergency Use Authorization (EUA). This EUA will remain  in effect (meaning this test can be used) for the duration of the COVID-19 declaration under Section 564(b)(1) of the Act, 21 U.S.C.section 360bbb-3(b)(1), unless the authorization is terminated  or revoked sooner.       Influenza A by PCR NEGATIVE NEGATIVE Final   Influenza B by PCR NEGATIVE NEGATIVE Final    Comment: (NOTE) The Xpert Xpress SARS-CoV-2/FLU/RSV plus assay is intended as an aid in the diagnosis of influenza from Nasopharyngeal swab specimens and should not be used as a sole basis for treatment. Nasal washings and aspirates are unacceptable for Xpert Xpress SARS-CoV-2/FLU/RSV testing.  Fact Sheet for Patients: EntrepreneurPulse.com.au  Fact Sheet for Healthcare Providers: IncredibleEmployment.be  This test is not yet approved or cleared by the [place] FDA and has been authorized for detection and/or diagnosis of SARS-CoV-2 by FDA under an Emergency Use Authorization (EUA). This EUA will remain in effect (meaning this  test can be used) for the duration of the COVID-19 declaration under Section  564(b)(1) of the Act, 21 U.S.C. section 360bbb-3(b)(1), unless the authorization is terminated or revoked.  Performed at [facility], [place] [address], [place], [place] [address]      Radiological Exams on Admission: DG Chest Port 1 View  Result Date: 03/30/2021 CLINICAL DATA:  Concern for sepsis EXAM: PORTABLE CHEST 1 VIEW COMPARISON:  Radiograph 08/20/2018, CT 07/27/2018 FINDINGS: Confluent masslike opacities present in the left mid lung and retrocardiac space. No pneumothorax or visible effusion. Asymmetrically diminished left lung volumes are similar to the recent comparison radiography albeit with a more normal appearance on the somewhat more remote CT comparison. Tracheostomy tube in place. Cardiomediastinal contours are stable from prior with a calcified aorta. Degenerative changes are present in the imaged spine and shoulders. No acute osseous or soft tissue abnormality. IMPRESSION: Confluent masslike opacities present in the left mid lung. While this may be infectious/pneumonia in the setting of suspected sepsis, underlying mass is not fully excluded. Could consider short-term (6 week) repeat imaging following therapeutic intervention. Otherwise, consider CT imaging for further characterization. Electronically Signed   By: Lovena Le M.D.   On: 03/20/2021 04:24    EKG: Independently reviewed.  Sinus tachycardia at 107 bpm  Assessment/Plan Sepsis secondary to community-acquired pneumonia Acute on chronic hypoxic respiratory failure 2/2 ALS: Patient presents after being noted to have a drop in his O2 saturations down into the 70s.  Patient was noted to be febrile up to 101 F, tachycardic, and tachypneic with WBC elevated at 17 meeting SIRS criteria.  Chest x-ray significant for confluent masslike opacities of the left mid lung concerning for infection.  Patient with known history of ALS and bilateral diaphragm paralysis since 2019.  Lactic acid was reassuring at 1.6.  Patient had  been given empiric antibiotics of vancomycin and cefepime and blood cultures obtained.   -Admit to a progressive bed -Continuous pulse oximetry  -Blood and sputum cultures/studies -Check procalcitonin -Check CT scan of the chest -Continue empiric antibiotics of vancomycin and cefepime -Plan to discontinue vancomycin if MRSA PCR negative -Recheck CBC tomorrow morning -Appreciate PCCM for vent management, will follow-up for any further recommendation -Chest physiotherapy, breathing treatments, mucinex, hypertonic nebs per recommendation  Hypokalemia: Acute on chronic.  Initial potassium noted to be 2.8  -Give potassium chloride 60 mEq x 1 dose now -Continue home potassium chloride 20 mEq twice daily tomorrow -Check magnesium level  Diabetes mellitus type 2: On admission glucose was 84.  Home glucose regimen includes Jardiance 10 mg daily, glipizide 10 mg daily, Novolin 10 units 3 times daily with meals, and NovoLog sliding scale of insulin. -Hypoglycemic protocol -Check hemoglobin A1c -CBGs before every meal and at bedtime with sensitive SSI -Continue to monitor and adjust regimen as needed  Essential hypertension: On admission systolic blood pressures elevated up into the 170s.  Home blood pressure medications include Coreg 6.25 mg twice daily with meals, furosemide 20 mg daily, and losartan 25 mg daily. -Continue home regimen  Anasarca: Patient with 2+ pitting edema of the bilateral lower extremities as well as upper extremities.  Family reports swelling has been in proving.  Question if this is related to hypoalbuminemia/third spacing versus CHF. -Continue fluid restriction -Check BNP -Continue furosemide p.o., may need IV diuretics and further work-up including echocardiogram  RA: Initially diagnosed back in 2008 on methotrexate and folic acid. -Continue methotrexate in outpatient setting -Continue folic acid  History of TB: Patient  with remote history of TB status post treatment  in approximately 2015.  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  Anxiety/depression -Continue Paxil and Ativan as needed  Hypoalbuminemia: Acute.  Albumin noted to be 3.1 on admission. -Check prealbumin in a.m.  Hyperlipidemia -Continue Lipitor  Glaucoma -Continue eye drop regimen  Follow-up HIV: Patient at risk is significant other is HIV positive but previously negative back in 2019.   DVT prophylaxis: Lovenox Code Status: Full Family Communication: Wife updated over the phone Disposition Plan: Likely discharge home once medically stable Consults called: PCCM  Admission status: Inpatient   Norval Morton MD [facility]   If 7PM-7AM, please contact night-coverage   04/09/2021, 8:21 AM

## 2021-03-29 NOTE — Consult Note (Signed)
NAME:  Wayne Young, MRN:  [id number], DOB:  1956/07/08, LOS: 0 ADMISSION DATE:  04/05/2021, CONSULTATION DATE: 03/27/2021 REFERRING MD:  Dr. Laverta Baltimore, CHIEF COMPLAINT: Hypoxic respiratory failure History of Present Illness:  HPI obtained from medical chart review as patient is mechanically ventilated via trach.  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  Patient has home health nurse who noted increased work of breathing and hypoxemia which began overnight.  Has been having increased secretions since Wednesday.  She had been suctioning them Was suctioned without secretions and given breathing treatment with minimal increase in O2.   In fact after the most recent suctioning home nurse noted that his oxygen saturations went down significantly and thus called EMS.  On EMS arrival, O2 sats in the 80s which increased to the 90s on 6 L from usually 5 L bled it.  Home health nurse also reported improving lower leg edema.  This is been improving over the last few months  In ER, noted to be febrile at 101, minimally tachycardic at 106 and normotensive.  Labs significant for WBC 17, K 2.8, low BUN 7 and sCr 0.32, albumin 3.1, normal lactate, INR 1.3, SARS/ flu neg, UA cloudy with large leukocytes and pyuria.  CXR noted for confluent masslike opacities in the left mid lung, most suspicious for pneumonia, but underlying mass not excluded.  Blood and urine cultures sent and empirically started on vancomycin and cefepime.  TRH to admit, PCCM consulted for chronic vent management.   Pertinent  Medical History  ALS w/trach on home vent, HTN, DM, arthritis, glaucoma  Significant Hospital Events: Including procedures, antibiotic start and stop dates in addition to other pertinent events   Admit to [facility] with sepsis, pneumonia/ UTI  6/11 Bcx2 >> 6/11 Ucx >>  6/11  vanc >> 6/11 cefepime >>  Interim History / Subjective:   Objective   Blood pressure (!) 172/69, pulse 93, temperature 100.1 F (37.8 C), temperature source Oral, resp. rate 18, height _0  (1.753 m), weight 68 kg, SpO2 98 %.    FiO2 (%):  [44 %] 44 %   Intake/Output Summary (Last 24 hours) at 04/02/2021 0629 Last data filed at 03/27/2021 0552 Gross per 24 hour  Intake 500 ml  Output --  Net 500 ml   Filed Weights   04/09/2021 0323  Weight: 68 kg    Examination: General:   HEENT: MM pink/moist Neuro: Patient unable to move upper or lower extremities to command.  Home nurse notes that he is extremely weak and cannot move his extremities at baseline.  He is able to open his mouth and eyes to command.  Pupils were equally round and reactive to light. CV: s1s2, regular rate and rhythm no m/r/g PULM: Diminished breath sounds on the left with some crackles appreciated.  More clear air entry on the right. GI: soft, bsx4 active  Extremities: warm/dry, 2+ edema at feet and lower shins  Skin: no rashes or lesions  Labs/imaging that I havepersonally reviewed  (right click and "Reselect all SmartList Selections" daily)  CXR, CBC, BMET, UA  Resolved Hospital Problem list    Assessment & Plan:   Acute on chronic hypoxic respiratory failure 2/2 ALS.  On home mini vent with [employer].  Using AVAPS mode.  PEEP noted to be 6, respiratory rate 20, PIP 20 exhaled tidal volume with this around  300-minute ventilation around 5.  Only has an increase of about 1 L/min of the blood and oxygen to his device. Left PNA  Sepsis - Continue MV support, 8cc/kg IBW with goal Pplat <30 and DP<15  - VAP prevention protocol/ PPI - PAD protocol for sedation> not needed -  wean FiO2 as able for SpO2 >92%  - check ABG - send trach asp - continue with vanc/ cefepime, if MRSA PCR neg, consider d/c vanc - consider chest CT - ongoing aggressive pulm hygiene w/ home guaifenesin/ BID hypertonic nebs -  xopenex q 6hr - trach care -Chest physiotherapy with [employer] if currently available would schedule that at least 3 times a day with nebulized albuterol or DuoNebs.   Remainder per primary team.  PCCM will follow.    Best practice (right click and "Reselect all SmartList Selections" daily)  Diet:  NPO Pain/Anxiety/Delirium protocol (if indicated): No VAP protocol (if indicated): Yes DVT prophylaxis: LMWH GI prophylaxis: H2B Glucose control:  SSI No Central venous access:  N/A Arterial line:  N/A Foley:  N/A Mobility:  bed rest  PT consulted: N/A Last date of multidisciplinary goals of care discussion [per primary] Code Status:  full code Disposition: admit per TRH, PCU, but in ICU for chronic vent  Labs   CBC: Recent Labs  Lab 04/13/2021 0443 03/21/2021 0515  WBC  --  17.0*  NEUTROABS  --  14.8*  HGB 15.6 14.3  HCT 46.0 45.5  MCV  --  82.3  PLT  --  378    Basic Metabolic Panel: Recent Labs  Lab 03/25/2021 0443 04/15/2021 0515  NA 137 136  K 3.8 2.8*  CL 103 100  CO2  --  24  GLUCOSE 84 84  BUN 10 7*  CREATININE <0.20* 0.32*  CALCIUM  --  9.2   GFR: Estimated Creatinine Clearance: 86.2 mL/min (A) (by C-G formula based on SCr of 0.32 mg/dL (L)). Recent Labs  Lab 04/13/2021 0515 03/28/2021 0519  WBC 17.0*  --   LATICACIDVEN  --  1.6    Liver Function Tests: Recent Labs  Lab 03/26/2021 0515  AST 16  ALT 14  ALKPHOS 84  BILITOT 1.2  PROT 8.1  ALBUMIN 3.1*   No results for input(s): LIPASE, AMYLASE in the last 168 hours. No results for input(s): AMMONIA in the last 168 hours.  ABG    Component Value Date/Time   PHART 7.308 (L) 08/10/2018 0540   PCO2ART 64.6 (H) 08/10/2018 0540   PO2ART 279.0 (H) 08/10/2018 0540   HCO3 31.9 (H) 08/10/2018 0540   TCO2 26 03/31/2021 0443   O2SAT 100.0 08/10/2018 0540     Coagulation Profile: Recent Labs  Lab 04/08/2021 0515  INR 1.3*    Cardiac Enzymes: No results for input(s): CKTOTAL, CKMB, CKMBINDEX, TROPONINI  in the last 168 hours.  HbA1C: Hgb A1c MFr Bld  Date/Time Value Ref Range Status  07/25/2018 03:59 PM 8.1 (H) 4.8 - 5.6 % Final    Comment:    (NOTE) Pre diabetes:          5.7%-6.4% Diabetes:              >6.4% Glycemic control for   <7.0% adults with diabetes     CBG: No results for input(s): GLUCAP in the last 168 hours.  Review of Systems:   Pertinent positives and negatives per HPI otherwise a 14 point review of systems was negative  Past Medical History:  He,  has a past medical  history of Arthritis, Diabetes mellitus without complication ([place]), Glaucoma, and Hypertension.   Surgical History:   Past Surgical History:  Procedure Laterality Date   HERNIA REPAIR       Social History:   reports that he has quit smoking. He has never used smokeless tobacco. He reports that he does not drink alcohol and does not use drugs.   Family History:  His family history includes Asthma in his mother.   Allergies No Known Allergies   Home Medications  Prior to Admission medications   Medication Sig Start Date End Date Taking? Authorizing Provider  ACCU-CHEK AVIVA PLUS test strip  09/05/20   [provider]  acetaminophen (TYLENOL) 325 MG tablet Place 2 tablets (650 mg total) into feeding tube every 6 (six) hours as needed for mild pain, fever or headache. 08/20/18   Omar Person, NP  albuterol (PROVENTIL) (2.5 MG/3ML) 0.083% nebulizer solution Take 3 mLs (2.5 mg total) by nebulization every 2 (two) hours as needed for wheezing or shortness of breath. 08/20/18   Omar Person, NP  amLODipine (NORVASC) 10 MG tablet Place 1 tablet (10 mg total) into feeding tube daily. 08/21/18   Omar Person, NP  atorvastatin (LIPITOR) 20 MG tablet  08/16/20   [provider]  brimonidine (ALPHAGAN) 0.2 % ophthalmic solution Place 2 drops into both eyes 3 (three) times daily. 08/21/18   Omar Person, NP  Chlorhexidine Gluconate Cloth 2 % PADS Apply 6 each  topically daily. 08/21/18   Omar Person, NP  chlorhexidine gluconate, MEDLINE KIT, (PERIDEX) 0.12 % solution 15 mLs by Mouth Rinse route 2 (two) times daily. 08/21/18   Omar Person, NP  docusate (COLACE) 50 MG/5ML liquid Place 10 mLs (100 mg total) into feeding tube daily. 08/21/18   Omar Person, NP  DROPLET INSULIN SYRINGE 31G X 5/16" 1 ML MISC  09/05/20   [provider]  enoxaparin (LOVENOX) 40 MG/0.4ML injection Inject 0.4 mLs (40 mg total) into the skin daily. 08/21/18   Omar Person, NP  famotidine (PEPCID) 40 MG/5ML suspension Place 2.5 mLs (20 mg total) into feeding tube 2 (two) times daily. 08/21/18   Omar Person, NP  fentaNYL (SUBLIMAZE) 100 MCG/2ML injection Inject 0.5-2 mLs (25-100 mcg total) into the vein every 2 (two) hours as needed for severe pain. 08/20/18   Omar Person, NP  fluconazole (DIFLUCAN) 150 MG tablet Take 1 tablet (150 mg total) by mouth once a week. 05/16/20   Evelina Bucy, DPM  fluticasone (CUTIVATE) 0.05 % cream  08/01/20   [provider]  folic acid (FOLVITE) 1 MG tablet Take 1 tablet by mouth daily. 10/31/20   [provider]  furosemide (LASIX) 20 MG tablet  11/25/20   [provider]  guaiFENesin (ROBITUSSIN) 100 MG/5ML SOLN Place 10 mLs (200 mg total) into feeding tube every 4 (four) hours. 08/21/18   Omar Person, NP  hydrochlorothiazide (HYDRODIURIL) 25 MG tablet  09/06/20   [provider]  Hydrocortisone (GERHARDT'S BUTT CREAM) CREA Apply 1 application topically as needed for irritation (apply to buttocks, groin, areas of MASD). 08/20/18   Omar Person, NP  hydrOXYzine (ATARAX/VISTARIL) 25 MG tablet  04/29/20   [provider]  insulin aspart (NOVOLOG) 100 UNIT/ML injection Inject 0-9 Units into the skin every 4 (four) hours. 08/21/18   Omar Person, NP  insulin glargine (LANTUS) 100 UNIT/ML injection Inject 0.1 mLs (10 Units total) into the skin  daily. 08/21/18   Omar Person, NP  ketoconazole (NIZORAL) 2 % cream APPLY 1 FINGERTIP AMOUNT TO EACH FOOT DAILY 06/10/20   Evelina Bucy, DPM  labetalol (NORMODYNE,TRANDATE) 5 MG/ML injection Inject 2 mLs (10 mg total) into the vein every 6 (six) hours as needed (systolic greater than 567). 08/20/18   Omar Person, NP  levalbuterol Penne Lash) 1.25 MG/0.5ML nebulizer solution Take 1.25 mg by nebulization every 6 (six) hours. 08/21/18   Omar Person, NP  levETIRAcetam (KEPRRA) 500 MG/100ML SOLN Inject 100 mLs (500 mg total) into the vein every 12 (twelve) hours. 08/21/18   Omar Person, NP  LORazepam (ATIVAN) 2 MG/ML injection Inject 0.25 mLs (0.5 mg total) into the vein every 4 (four) hours as needed for anxiety. 08/20/18   Omar Person, NP  methotrexate (RHEUMATREX) 2.5 MG tablet Take by mouth. 01/06/21   [provider]  mouth rinse LIQD solution 15 mLs by Mouth Rinse route every 2 (two) hours. 08/20/18   Omar Person, NP  Nutritional Supplements (FEEDING SUPPLEMENT, VITAL AF 1.2 CAL,) LIQD Place 1,000 mLs into feeding tube continuous. 08/20/18   Omar Person, NP  ondansetron (ZOFRAN) 4 MG/2ML SOLN injection Inject 2 mLs (4 mg total) into the vein every 8 (eight) hours as needed for nausea or vomiting. 08/20/18   Omar Person, NP  pantoprazole (PROTONIX) 20 MG tablet  09/03/20   [provider]  PARoxetine (PAXIL) 20 MG tablet Take 1 tablet by mouth every morning. 11/06/20   [provider]  potassium chloride 20 MEQ/15ML (10%) SOLN TAKE 15 MILLITERS DILUTED IN ONE-HALF GLASS OF COLD WATER OR JUICE BY MOUTH 2 TIMES DAILY 11/15/20   [provider]  scopolamine (TRANSDERM-SCOP) 1 MG/3DAYS Place 1 patch (1.5 mg total) onto the skin every 3 (three) days. 08/22/18   Omar Person, NP  sennosides (SENOKOT) 8.8 MG/5ML syrup Take 10 mLs by mouth every 12 (twelve) hours as needed for mild constipation. 08/20/18   Omar Person, NP  sodium chloride 0.9 % infusion Inject 10 mLs into the vein continuous. 08/20/18   Omar Person, NP  sodium chloride flush (NS) 0.9 % SOLN 10-40 mLs by Intracatheter route every 12 (twelve) hours. 08/21/18   Omar Person, NP  sodium chloride flush (NS) 0.9 % SOLN 10-40 mLs by Intracatheter route as needed (flush). 08/20/18   Omar Person, NP  sodium chloride HYPERTONIC 3 % nebulizer solution Take 4 mLs by nebulization 2 (two) times daily. 08/21/18   Omar Person, NP  sulfamethoxazole-trimethoprim (BACTRIM DS) 800-160 MG tablet  08/01/20   [provider]     Critical care time: 55 minutes

## 2021-03-29 NOTE — Progress Notes (Signed)
Pharmacy Antibiotic Note  Wayne Young is a 65 y.o. male admitted on 03/26/2021 with pneumonia.  Pharmacy has been consulted for Vancomycin dosing. Patient with ALS and likely decreased muscle mass.   Vancomycin 1250mg  given at 0530 this AM.   Plan: Vancomycin 1000mg  IV q12h Goal AUC 400-550. Expected AUC: 518 SCr used: 0.8 (used as estimate in setting of low muscle mass)  Follow-up MRSA PCR  Height: 5\' 9"  (175.3 cm) Weight: 68 kg (150 lb) IBW/kg (Calculated) : 70.7  Temp (24hrs), Avg:99.9 F (37.7 C), Min:98.5 F (36.9 C), Max:101 F (38.3 C)  Recent Labs  Lab 03/27/2021 0443 04/07/2021 0515 03/26/2021 0519  WBC  --  17.0*  --   CREATININE <0.20* 0.32*  --   LATICACIDVEN  --   --  1.6    Estimated Creatinine Clearance: 86.2 mL/min (A) (by C-G formula based on SCr of 0.32 mg/dL (L)).    No Known Allergies  Antimicrobials this admission: Vanc 6/11  Dose adjustments this admission:   Microbiology results: pending  Thank you for allowing pharmacy to be a part of this patient's care.  05/29/21 03/31/2021 9:23 AM

## 2021-03-29 NOTE — ED Triage Notes (Signed)
BIB [facility] from home. Pt has a home ventilation system for trach. Home nurse seen that his O2 was in the 70s. Suctioning and gave albuterol treatment. Pt O2 continue to be in the 60-70s. When EMS arrived. Pt was in mid 80s. EMS placed him on 6L/min and at 94%. Was hypertensive at 290/80 manually. In the truck BP was 111/63. A&Ox4.   111/63 104 heart rate 20 RR 96% 6L/min on home vent.   CBG 250

## 2021-03-29 NOTE — Plan of Care (Signed)
  Problem: Activity: Goal: Ability to tolerate increased activity will improve Outcome: Not Progressing   Problem: Respiratory: Goal: Ability to maintain a clear airway and adequate ventilation will improve Outcome: Progressing   Problem: Role Relationship: Goal: Method of communication will improve Outcome: Not Progressing

## 2021-03-29 NOTE — ED Provider Notes (Signed)
Emergency Department Provider Note   I have reviewed the triage vital signs and the nursing notes.   HISTORY  Chief Complaint Respiratory Distress   HPI Wayne Young is a 65 y.o. 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 The patient's home nurse noted some increased WOB and hypoxemia which began suddenly overnight. The nurse suctioned with some secretion return and denies any blood or clot. She gave a breathing treatment and increased O2 but when EMS arrived the patient remained in the 80s. EMS increased O2 to 6L/min and O2 improved. Patient denies pain. He is feeling better. Noted to have fever here which was a surprise to the patient and home nurse. Nurse also notes that he has leg swelling but that it is "going down." Has a condom cath but no foley.   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   Hypoalbuminemia 04/10/2021   History of TB (tuberculosis) 04/16/2021   Acute on chronic respiratory failure with hypoxia ([place]) 04/06/2021   ALS (amyotrophic lateral sclerosis) ([place]) 02/07/2021   Acute on chronic respiratory failure with hypoxemia ([place]) 08/09/2019   Hyperlipidemia 08/09/2019   Hypertension 08/09/2019   Type 2 diabetes mellitus, with Karel Turpen-term current use of insulin ([place]) 08/09/2019   Consolidation lung ([place])    Acute respiratory distress    Encounter for imaging study to confirm orogastric (OG) tube placement    Self extubation    Aspiration into airway    Pressure injury of skin 08/04/2018   Atelectasis of left lung    Protein-calorie malnutrition, severe 07/28/2018   Recent unintentional weight loss over several months    Acute respiratory failure  ([place]) 07/25/2018   Positive anti-CCP test 07/13/2018   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  Family History  Problem Relation Age of Onset   Asthma Mother     Social History Social History   Tobacco Use   Smoking status: Former    Pack years: 0.00   Smokeless tobacco: Never   Tobacco comments:    quit about 20 years ago   Vaping Use   Vaping Use: Never used  Substance Use Topics   Alcohol use: Never   Drug use: Never    Review of Systems  Constitutional: Positive fever/chills Eyes: No visual changes. ENT: No sore throat. Cardiovascular: Denies chest pain. Respiratory: Positive shortness of breath and hypoxemia.  Gastrointestinal: No abdominal pain.  No nausea, no vomiting.  No diarrhea.  No constipation. Genitourinary: Negative for dysuria. Musculoskeletal: Negative for back pain. Skin: Negative for rash. Neurological: Negative for headaches, focal weakness or numbness.  10-point ROS otherwise negative.  ____________________________________________   PHYSICAL  EXAM:  VITAL SIGNS: ED Triage Vitals  Enc Vitals Group     BP 03/30/2021 0322 117/73     Pulse Rate 03/24/2021 0322 (!) 106     Resp 03/23/2021 0322 19     Temp 03/22/2021 0322 (!) 101 F (38.3 C)     Temp Source 04/13/2021 0322 Oral     SpO2 04/02/2021 0322 95 %     Weight 03/19/2021 0323 150 lb (68 kg)     Height 04/03/2021 0323 5' 9"  (1.753 m)   Constitutional: Alert and oriented. Well appearing and in no acute  distress. Eyes: Conjunctivae are normal.  Head: Atraumatic. Nose: No congestion/rhinnorhea. Mouth/Throat: Mucous membranes are moist.  Neck: No stridor. Well appearing trach site. No bleeding.  Cardiovascular: Mild tachycardia. Good peripheral circulation. Grossly normal heart sounds.   Respiratory: Normal respiratory effort.  No retractions. Lungs with mild end expiratory wheezing.  Gastrointestinal: Soft and nontender. No distention.  Musculoskeletal: No lower extremity tenderness with 2+ pitting edema. No gross deformities of extremities. Neurologic:  Normal speech and language. Skin:  Skin is warm, dry and intact. No rash noted.   ____________________________________________   LABS (all labs ordered are listed, but only abnormal results are displayed)  Labs Reviewed  COMPREHENSIVE METABOLIC PANEL - Abnormal; Notable for the following components:      Result Value   Potassium 2.8 (*)    BUN 7 (*)    Creatinine, Ser 0.32 (*)    Albumin 3.1 (*)    All other components within normal limits  CBC WITH DIFFERENTIAL/PLATELET - Abnormal; Notable for the following components:   WBC 17.0 (*)    MCH 25.9 (*)    RDW 16.2 (*)    Neutro Abs 14.8 (*)    Abs Immature Granulocytes 0.08 (*)    All other components within normal limits  PROTIME-INR - Abnormal; Notable for the following components:   Prothrombin Time 16.0 (*)    INR 1.3 (*)    All other components within normal limits  APTT - Abnormal; Notable for the following components:   aPTT 23 (*)    All other components within normal limits  URINALYSIS, ROUTINE W REFLEX MICROSCOPIC - Abnormal; Notable for the following components:   Color, Urine AMBER (*)    APPearance CLOUDY (*)    Glucose, UA >=500 (*)    Hgb urine dipstick MODERATE (*)    Protein, ur 100 (*)    Leukocytes,Ua LARGE (*)    WBC, UA >50 (*)    Bacteria, UA MANY (*)    Non Squamous Epithelial 0-5 (*)    All other components within normal limits  GLUCOSE,  CAPILLARY - Abnormal; Notable for the following components:   Glucose-Capillary 121 (*)    All other components within normal limits  GLUCOSE, CAPILLARY - Abnormal; Notable for the following components:   Glucose-Capillary 103 (*)    All other components within normal limits  I-STAT CHEM 8, ED - Abnormal; Notable for the following components:   Creatinine, Ser <0.20 (*)    Calcium, Ion 1.02 (*)    All other components within normal limits  I-STAT ARTERIAL BLOOD GAS, ED - Abnormal; Notable for the following components:   pH, Arterial 7.469 (*)    Acid-Base Excess 3.0 (*)    Potassium 2.3 (*)    All other components within normal limits  RESP PANEL BY RT-PCR (FLU A&B, COVID) ARPGX2  EXPECTORATED SPUTUM ASSESSMENT W GRAM STAIN, RFLX TO RESP C  MRSA NEXT  GEN BY PCR, NASAL  CULTURE, RESPIRATORY W GRAM STAIN  CULTURE, BLOOD (ROUTINE X 2)  CULTURE, BLOOD (ROUTINE X 2)  URINE CULTURE  CULTURE, RESPIRATORY W GRAM STAIN  MRSA PCR SCREENING  LACTIC ACID, PLASMA  HIV ANTIBODY (ROUTINE TESTING W REFLEX)  STREP PNEUMONIAE URINARY ANTIGEN  MAGNESIUM  BRAIN NATRIURETIC PEPTIDE  AMMONIA  PROCALCITONIN  LEGIONELLA PNEUMOPHILA SEROGP 1 UR AG  HEMOGLOBIN A1C  BLOOD GAS, ARTERIAL  POTASSIUM  PREALBUMIN  CBC  BASIC METABOLIC PANEL  CBG MONITORING, ED   ____________________________________________  EKG   EKG Interpretation  Date/Time:  Saturday March 29 2021 03:22:16 EDT Ventricular Rate:  107 PR Interval:  159 QRS Duration: 87 QT Interval:  351 QTC Calculation: 469 R Axis:   -6 Text Interpretation: Sinus tachycardia Low voltage, extremity leads Consider anterior infarct Similar to 2019 tracing Confirmed by Nanda Quinton [phone]) on 04/09/2021 3:48:10 AM         ____________________________________________  RADIOLOGY  CT CHEST WO CONTRAST  Result Date: 04/01/2021 CLINICAL DATA:  Pneumonia with effusion or abscess suspected. New hypoxia. EXAM: CT CHEST WITHOUT CONTRAST  TECHNIQUE: Multidetector CT imaging of the chest was performed following the standard protocol without IV contrast. COMPARISON:  Radiograph from earlier today.  Chest CT 07/27/2018 FINDINGS: Cardiovascular: Normal heart size. No pericardial effusion. Mild atheromatous calcification of the aorta and coronaries. Mediastinum/Nodes: Generous sized lymph nodes which may be reactive in this setting. Chronic calcified mediastinal nodes from remote granulomatous disease. Lungs/Pleura: Tracheostomy tube in place. Central airway debris at both mainstem bronchi with layering. Extensive consolidative opacity with some volume loss on the left lung where there is a small parapneumonic effusion. No cavitary changes. More atelectatic type opacity at the right base. No edema. No air leak. Upper Abdomen: Negative Musculoskeletal: No acute finding IMPRESSION: 1. Extensive pneumonia on the left. Recommend follow-up to clearing. 2. Extensive airway debris narrowing bilateral mainstem bronchi. 3. Small pleural effusions. Electronically Signed   By: Monte Fantasia M.D.   On: 04/10/2021 11:14   DG Chest Port 1 View  Result Date: 03/24/2021 CLINICAL DATA:  Concern for sepsis EXAM: PORTABLE CHEST 1 VIEW COMPARISON:  Radiograph 08/20/2018, CT 07/27/2018 FINDINGS: Confluent masslike opacities present in the left mid lung and retrocardiac space. No pneumothorax or visible effusion. Asymmetrically diminished left lung volumes are similar to the recent comparison radiography albeit with a more normal appearance on the somewhat more remote CT comparison. Tracheostomy tube in place. Cardiomediastinal contours are stable from prior with a calcified aorta. Degenerative changes are present in the imaged spine and shoulders. No acute osseous or soft tissue abnormality. IMPRESSION: Confluent masslike opacities present in the left mid lung. While this may be infectious/pneumonia in the setting of suspected sepsis, underlying mass is not fully  excluded. Could consider short-term (6 week) repeat imaging following therapeutic intervention. Otherwise, consider CT imaging for further characterization. Electronically Signed   By: Lovena Le M.D.   On: 03/21/2021 04:24    ____________________________________________   PROCEDURES  Procedure(s) performed:   Procedures  CRITICAL CARE Performed by: Margette Fast Total critical care time: 35 minutes Critical care time was exclusive of separately billable procedures and treating other patients. Critical care was necessary to treat or prevent imminent or life-threatening deterioration. Critical care was time spent personally by me on the following activities: development of treatment plan with patient and/or surrogate as well as nursing, discussions with consultants, evaluation of patient's response to treatment, examination of patient, obtaining history from patient or surrogate,  ordering and performing treatments and interventions, ordering and review of laboratory studies, ordering and review of radiographic studies, pulse oximetry and re-evaluation of patient's condition.  Nanda Quinton, MD Emergency Medicine    Emergency Ultrasound Study:   Angiocath insertion Performed by: Margette Fast  Consent: Verbal consent obtained. Risks and benefits: risks, benefits and alternatives were discussed Immediately prior to procedure the correct patient, procedure, equipment, support staff and site/side marked as needed.  Indication: difficult IV access Preparation: Patient was prepped and draped in the usual sterile fashion. Vein Location: Left AC vein was visualized during assessment for potential access sites and was found to be patent/ easily compressed with linear ultrasound.  The needle was visualized with real-time ultrasound and guided into the vein. Gauge: 20  Image saved and stored.  Normal blood return.  Patient tolerance: Patient tolerated the procedure well with no immediate  complications.    ____________________________________________   INITIAL IMPRESSION / ASSESSMENT AND PLAN / ED COURSE  Pertinent labs & imaging results that were available during my care of the patient were reviewed by me and considered in my medical decision making (see chart for details).   Patient presents to the emergency department with shortness of breath and hypoxemia at home which began fairly suddenly.  Suctioning, breathing treatments, increased oxygen seem to have improved symptoms.  He is in no respiratory distress here with normal oxygen saturation although requiring increased oxygen compared to his baseline.  He is also febrile here.  Plan for COVID/flu testing.  Patient with prior history of aspiration pneumonia.  Has a condom cath but no Foley.  No abdominal or GI symptoms.  No rash.   Patient with PNA on CXR. Correlates clinically with exam and history. Plan for admit and abx. [place] IV placed. Abx given. No hypotension. SOB improved.   Discussed patient's case with TRH to request admission. Patient and family (if present) updated with plan. Care transferred to [facility] service.  I reviewed all nursing notes, vitals, pertinent old records, EKGs, labs, imaging (as available).  ____________________________________________  FINAL CLINICAL IMPRESSION(S) / ED DIAGNOSES  Final diagnoses:  Acute on chronic respiratory failure with hypoxia (HCC)  HCAP (healthcare-associated pneumonia)     MEDICATIONS GIVEN DURING THIS VISIT:  Medications  lactated ringers infusion ( Intravenous Infusion Verify 03/24/2021 1900)  ipratropium-albuterol (DUONEB) 0.5-2.5 (3) MG/3ML nebulizer solution 3 mL (3 mLs Nebulization Given 04/04/2021 2020)  enoxaparin (LOVENOX) injection 40 mg (40 mg Subcutaneous Given 04/12/2021 0921)  ceFEPIme (MAXIPIME) 2 g in sodium chloride 0.9 % 100 mL IVPB (2 g Intravenous New Bag/Given 03/19/2021 2157)  carvedilol (COREG) tablet 6.25 mg (6.25 mg Oral Given 04/05/2021 1604)   atorvastatin (LIPITOR) tablet 20 mg (20 mg Oral Given 04/02/2021 1804)  furosemide (LASIX) tablet 20 mg (20 mg Oral Given 03/26/2021 1217)  losartan (COZAAR) tablet 25 mg (25 mg Oral Not Given 03/23/2021 1215)  LORazepam (ATIVAN) tablet 0.5 mg (0.5 mg Oral Given 04/02/2021 1605)  hydrOXYzine (ATARAX/VISTARIL) tablet 25 mg (has no administration in time range)  PARoxetine (PAXIL) tablet 20 mg (20 mg Oral Given 04/03/2021 1217)  acetaminophen (TYLENOL) tablet 650 mg (has no administration in time range)  insulin aspart (novoLOG) injection 0-9 Units (1 Units Subcutaneous Given 04/12/2021 1640)  insulin aspart (novoLOG) injection 0-5 Units (0 Units Subcutaneous Not Given 03/21/2021 2206)  lactulose (CHRONULAC) 10 GM/15ML solution 20 g (20 g Oral Not Given 03/20/2021 1215)  famotidine (PEPCID) 40 MG/5ML suspension 20 mg (20 mg Oral Given 04/22/87 8916)  folic  acid (FOLVITE) tablet 1 mg (1 mg Oral Given 03/23/2021 1217)  guaiFENesin tablet 400 mg (has no administration in time range)  brimonidine (ALPHAGAN) 0.2 % ophthalmic solution 1 drop (1 drop Both Eyes Patient Refused/Not Given 03/28/2021 1640)  dorzolamide-timolol (COSOPT) 22.3-6.8 MG/ML ophthalmic solution 1 drop (1 drop Both Eyes Patient Refused/Not Given 03/20/2021 1639)  latanoprost (XALATAN) 0.005 % ophthalmic solution 1 drop (1 drop Both Eyes Given 03/22/2021 2218)  sodium chloride HYPERTONIC 3 % nebulizer solution 4 mL (4 mLs Nebulization Given 03/23/2021 2020)  potassium chloride (KLOR-CON) packet 20 mEq (20 mEq Oral Given 04/17/2021 2157)  polyvinyl alcohol (LIQUIFILM TEARS) 1.4 % ophthalmic solution 1 drop (1 drop Both Eyes Given 03/27/2021 2206)  hydrALAZINE (APRESOLINE) injection 10-20 mg (10 mg Intravenous Given 03/27/2021 2244)  labetalol (NORMODYNE) injection 5 mg (5 mg Intravenous Given 04/06/2021 1632)  Chlorhexidine Gluconate Cloth 2 % PADS 6 each (6 each Topical Given 04/03/2021 1830)  chlorhexidine gluconate (MEDLINE KIT) (PERIDEX) 0.12 % solution 15 mL (15 mLs Mouth Rinse  Given 03/31/2021 2209)  MEDLINE mouth rinse (has no administration in time range)  lactated ringers bolus 500 mL (0 mLs Intravenous Stopped 03/24/2021 0552)  acetaminophen (TYLENOL) suppository 650 mg (650 mg Rectal Given 04/08/2021 0521)  ceFEPIme (MAXIPIME) 2 g in sodium chloride 0.9 % 100 mL IVPB (0 g Intravenous Stopped 03/21/2021 0549)  vancomycin (VANCOREADY) IVPB 1250 mg/250 mL (0 mg Intravenous Stopped 04/08/2021 0728)  potassium chloride SA (KLOR-CON) CR tablet 60 mEq (60 mEq Oral Given 04/04/2021 1142)     Note:  This document was prepared using Dragon voice recognition software and may include unintentional dictation errors.  Nanda Quinton, MD, [facility] Emergency Medicine    Takuya Lariccia, [facility], MD 04/15/2021 [phone]

## 2021-03-29 NOTE — ED Notes (Signed)
Patient returned from CT

## 2021-03-30 DIAGNOSIS — J9621 Acute and chronic respiratory failure with hypoxia: Secondary | ICD-10-CM

## 2021-03-30 DIAGNOSIS — I10 Essential (primary) hypertension: Secondary | ICD-10-CM | POA: Diagnosis not present

## 2021-03-30 DIAGNOSIS — J189 Pneumonia, unspecified organism: Secondary | ICD-10-CM | POA: Diagnosis not present

## 2021-03-30 DIAGNOSIS — Z9911 Dependence on respirator [ventilator] status: Secondary | ICD-10-CM

## 2021-03-30 DIAGNOSIS — E785 Hyperlipidemia, unspecified: Secondary | ICD-10-CM | POA: Diagnosis not present

## 2021-03-30 LAB — CBC
HCT: 46.5 % (ref 39.0–52.0)
Hemoglobin: 14.4 g/dL (ref 13.0–17.0)
MCH: 25.8 pg — ABNORMAL LOW (ref 26.0–34.0)
MCHC: 31 g/dL (ref 30.0–36.0)
MCV: 83.2 fL (ref 80.0–100.0)
Platelets: 303 10*3/uL (ref 150–400)
RBC: 5.59 MIL/uL (ref 4.22–5.81)
RDW: 16.3 % — ABNORMAL HIGH (ref 11.5–15.5)
WBC: 16.8 10*3/uL — ABNORMAL HIGH (ref 4.0–10.5)
nRBC: 0 % (ref 0.0–0.2)

## 2021-03-30 LAB — BLOOD CULTURE ID PANEL (REFLEXED) - BCID2

## 2021-03-30 LAB — URINE CULTURE

## 2021-03-30 LAB — BASIC METABOLIC PANEL
Anion gap: 11 (ref 5–15)
BUN: 11 mg/dL (ref 8–23)
CO2: 22 mmol/L (ref 22–32)
Calcium: 9.2 mg/dL (ref 8.9–10.3)
Chloride: 106 mmol/L (ref 98–111)
Creatinine, Ser: <0.3 mg/dL — ABNORMAL LOW (ref 0.61–1.24)
Glucose, Bld: 96 mg/dL (ref 70–99)
Potassium: 3.5 mmol/L (ref 3.5–5.1)
Sodium: 139 mmol/L (ref 135–145)

## 2021-03-30 LAB — GLUCOSE, CAPILLARY
Glucose-Capillary: 73 mg/dL (ref 70–99)
Glucose-Capillary: 97 mg/dL (ref 70–99)
Glucose-Capillary: 104 mg/dL — ABNORMAL HIGH (ref 70–99)
Glucose-Capillary: 133 mg/dL — ABNORMAL HIGH (ref 70–99)

## 2021-03-30 LAB — PREALBUMIN: Prealbumin: 10.7 mg/dL — ABNORMAL LOW (ref 18–38)

## 2021-03-30 MED ORDER — FOOD THICKENER (SIMPLYTHICK HONEY)
1.0000 | ORAL | Status: DC | PRN
  Filled 2021-03-30 (×3): qty 1

## 2021-03-30 MED ORDER — SODIUM CHLORIDE 3 % IN NEBU
4.0000 mL | INHALATION_SOLUTION | Freq: Two times a day (BID) | RESPIRATORY_TRACT | Status: AC
  Administered 2021-03-31 – 2021-04-02 (×5): 4 mL via RESPIRATORY_TRACT
  Filled 2021-03-30 (×5): qty 4

## 2021-03-30 MED ORDER — IPRATROPIUM-ALBUTEROL 0.5-2.5 (3) MG/3ML IN SOLN
3.0000 mL | Freq: Three times a day (TID) | RESPIRATORY_TRACT | Status: DC
  Administered 2021-03-30 – 2021-04-10 (×32): 3 mL via RESPIRATORY_TRACT
  Filled 2021-03-30 (×31): qty 3

## 2021-03-30 NOTE — Evaluation (Addendum)
Clinical/Bedside Swallow Evaluation Patient Details  Name: Wayne Young MRN: [id number] Date of Birth: 05-08-1956  Today's Date: 03/30/2021 Time: SLP Start Time (ACUTE ONLY): 1045 SLP Stop Time (ACUTE ONLY): 1125 SLP Time Calculation (min) (ACUTE ONLY): 40 min  Past Medical History:  Past Medical History:  Diagnosis Date   Arthritis    Diabetes mellitus without complication (HCC)    Glaucoma    Hypertension    Past Surgical History:  Past Surgical History:  Procedure Laterality Date   HERNIA REPAIR     HPI:  Wayne Young is a 65 y.o. male with medical history significant of ALS s/p tracheostomy on home ventilator, hypertension, diabetes mellitus type 2, rheumatoid arthritis, remote history of TB, and glaucoma presents after being noted to have low oxygen saturations at home. Dx with sepsis secondary to CAP. CXR significant for confluent masslike opacities in the lift mid lung.   Assessment / Plan / Recommendation Clinical Impression  Bedside swallow evaluation complete. Patient appearing cognitively intact, directing care via head nods and overexaggerated speech. Patient aphonic given vent dependency. Discussed with RT potential of deflating cuff as patient told SLP he uses in line PMV at home prior to admission. RT came to room, delfated cuff and suctioned patient without incidence. Patient then able to wisper (breathy speech) to improve communication. Stated that he continues to consume soft foods and thin liquids at home. Patient provided with thin liquids and pureed solids at bedside, both appearing normal orally. Pharyngeally, patient with multiple (4-6) swallows per bite/sip, likely in attempts to clear pharyngeal residue through UES as noted on most recent MBS 01/2021. Increased upper airway congestion noted following pos of unknown origin.  Patient stating "Im not swallowing well today with all of this junk in my chest" and declined further trials at this time. Discussed  previous MBS results including present risk for aspiration as well as potential for decline in swallowing function when acutely ill or in general as disease progresses. I provided options based on exam including resuming home diet, modifying diet based on recommendations from previous MBS and current function, and repeat instrumental testing to determine if there has been a significant decline in function. Patient stated he would like repeat testing however not until he has improved acutely and would like to modify diet in attempts to decrease aspiration risk. Will order dysphagia 1 solids with nectar thick liquids based on previous instrumental testing. Unclear if current PNA is related to aspiration based on today's testing however based on previous  MBS, patient certainly is at risk for aspiration and the development of an aspiration related infection and he is will remain at this level of risk or greater as desease progresses. Palliative care consult may be helpful  in decision making for the future. SLP will continue to f/u.  MD, also request order for PMV placement to facilitate improved communication while patient remains on vent.   Note: either RT or RN present for evaluation, cuff reinflated by RT.   SLP Visit Diagnosis: Dysphagia, pharyngeal phase (R13.13)    Aspiration Risk       Diet Recommendation Dysphagia 1 (Puree);Nectar-thick liquid   Liquid Administration via: Cup;Straw Medication Administration: Crushed with puree Supervision: Staff to assist with self feeding;Full supervision/cueing for compensatory strategies Compensations: Slow rate;Small sips/bites Postural Changes: Seated upright at 90 degrees;Remain upright for at least 30 minutes after po intake    Other  Recommendations Oral Care Recommendations: Oral care BID Other Recommendations: Order thickener from pharmacy;Prohibited food (jello, ice  cream, thin soups);Remove water pitcher   Follow up Recommendations Other  (comment) (TBD)      Frequency and Duration min 3x week  2 weeks       Prognosis Prognosis for Safe Diet Advancement: Fair Barriers to Reach Goals: Severity of deficits;Time post onset      Swallow Study   General HPI: Wayne Young is a 65 y.o. male with medical history significant of ALS s/p tracheostomy on home ventilator, hypertension, diabetes mellitus type 2, rheumatoid arthritis, remote history of TB, and glaucoma presents after being noted to have low oxygen saturations at home. Dx with sepsis secondary to CAP. CXR significant for confluent masslike opacities in the lift mid lung. Type of Study: Bedside Swallow Evaluation Previous Swallow Assessment: MBS 02/14/21 with recommendations for dysphagia 1/2 and thin liquids with consideration for nectar thick liquids should patient decline or present with respiratory concerns. Diet Prior to this Study: Regular;Thin liquids Temperature Spikes Noted: No Respiratory Status: Trach Collar History of Recent Intubation: No Behavior/Cognition: Alert;Cooperative;Pleasant mood Oral Cavity Assessment: Within Functional Limits Oral Care Completed by SLP: Recent completion by staff Vision: Functional for self-feeding Self-Feeding Abilities: Total assist Patient Positioning: Upright in bed Baseline Vocal Quality: Breathy (with cuff deflated-see impression statement) Volitional Cough: Weak Volitional Swallow: Able to elicit    Oral/Motor/Sensory Function Overall Oral Motor/Sensory Function: Within functional limits   Ice Chips Ice chips: Not tested   Thin Liquid Thin Liquid: Impaired Presentation: Straw Pharyngeal  Phase Impairments: Multiple swallows;Wet Vocal Quality (increased upper airway congestion)    Nectar Thick Nectar Thick Liquid: Not tested   Honey Thick Honey Thick Liquid: Not tested   Puree Puree: Impaired Presentation: Spoon Pharyngeal Phase Impairments: Multiple swallows;Wet Vocal Quality;Other (comments) (increased upper  airway congestion)   Solid     Solid: Not tested     Wayne Borjon MA, CCC-SLP  Wayne Young 03/30/2021,11:59 AM

## 2021-03-30 NOTE — Progress Notes (Signed)
PROGRESS NOTE    [name]   ION:[id number]  DOB: Aug 13, 1956  DOA: 04/03/2021 PCP: Cipriano Mile, NP   Brief Narrative:  Wayne Young is a 65 year old male with rheumatoid arthritis, ALS with a trach on home vent, diabetes mellitus, hypertension. He was brought to the hospital for oxygen saturations that were in the 80s after nebulizer treatments. In the ED: Temperature 101, elevated respiratory rate and mildly tachycardic. WBC count 17, potassium 2.8 Anasarca was noted on exam  CT scan of the chest reveals extensive left lung opacities and debris in the mainstem bronchus. He was started on vancomycin and cefepime for sepsis secondary to pneumonia.   Subjective: Feels uncomfortable and wants to be repositioned.     Assessment & Plan:   Principal Problem:   Acute on chronic respiratory failure with hypoxia-ventilator related pneumonia Severe sepsis Immune compromised state -Extensive consolidation in the left lung with a small effusion-central airway debris in both mainstem bronchi -He remains tachycardic today WBC count is elevated at 16.8, procalcitonin 1.17 - Strep pneumo urinary antigen negative, HIV negative -Sputum culture growing rare staph aureus and Proteus mirabilis  Active Problems:  BCID positive for Staphylococcus species UA suggestive of a UTI - Culture is revealing multiple species in the urine - 1 set of blood culture growing gram-positive cocci in clusters    Hypokalemia -Potassium 2.8-has improved to 3.5 -He is supposed to be taking 20 mEq twice daily at home  Anasarca - He is on 20 mg of Lasix daily-he states he drinks a lot of fluid at home -EF in 12/21 was 60 to 65% with mild LVH - Albumin level 3.1 - Prealbumin is 10    Rheumatoid arthritis with positive rheumatoid factor  -He receives methotrexate every Thursday   ALS (amyotrophic lateral sclerosis)  -He gets up to a wheelchair with assistance    Hyperlipidemia -Lipitor being  continued    Hypertension -Continue carvedilol losartan, furosemide ad PRN IV Labetalol and PRN IV Hydralazine    Type 2 diabetes mellitus, with long-term current use of insulin  -Empagliflozin, glipizide on hold - Continue NovoLog sliding scale    History of TB (tuberculosis)   Time spent in minutes: 35 DVT prophylaxis: enoxaparin (LOVENOX) injection 40 mg Start: 03/25/2021 1000 Code Status: Full code Family Communication:  Level of Care: Level of care: ICU-due to being on a ventilator Disposition Plan:  Status is: Inpatient  Remains inpatient appropriate because:IV treatments appropriate due to intensity of illness or inability to take PO  Dispo: The patient is from: Home              Anticipated d/c is to: Home              Patient currently is not medically stable to d/c.   Difficult to place patient No      Consultants:  Pulmonary critical care managing ventilator Procedures:   Antimicrobials:  Anti-infectives (From admission, onward)    Start     Dose/Rate Route Frequency Ordered Stop   04/09/2021 1700  vancomycin (VANCOREADY) IVPB 1000 mg/200 mL  Status:  Discontinued        1,000 mg 200 mL/hr over 60 Minutes Intravenous Every 12 hours 04/09/2021 0943 03/22/2021 1115   04/15/2021 1300  ceFEPIme (MAXIPIME) 2 g in sodium chloride 0.9 % 100 mL IVPB        2 g 200 mL/hr over 30 Minutes Intravenous Every 8 hours 03/25/2021 0833     03/22/2021 1300  vancomycin (VANCOREADY) IVPB  1500 mg/300 mL  Status:  Discontinued        1,500 mg 150 mL/hr over 120 Minutes Intravenous Every 8 hours 03/22/2021 0941 04/16/2021 0943   04/17/2021 0530  vancomycin (VANCOREADY) IVPB 1000 mg/200 mL  Status:  Discontinued        1,000 mg 200 mL/hr over 60 Minutes Intravenous  Once 04/01/2021 0517 03/28/2021 0518   04/08/2021 0530  ceFEPIme (MAXIPIME) 2 g in sodium chloride 0.9 % 100 mL IVPB        2 g 200 mL/hr over 30 Minutes Intravenous  Once 04/05/2021 0517 03/23/2021 0549   03/26/2021 0530  vancomycin  (VANCOREADY) IVPB 1250 mg/250 mL        1,250 mg 166.7 mL/hr over 90 Minutes Intravenous  Once 03/30/2021 0518 03/26/2021 0728        Objective: Vitals:   03/30/21 0300 03/30/21 0400 03/30/21 0500 03/30/21 0600  BP: 133/65 (!) 152/71 (!) 153/71 (!) 169/71  Pulse: 97 97 99 (!) 104  Resp: 16 16 16 16   Temp:  98.9 F (37.2 C)    TempSrc:  Oral    SpO2: 96% 97% 94% 97%  Weight:    79.7 kg  Height:        Intake/Output Summary (Last 24 hours) at 03/30/2021 0739 Last data filed at 03/30/2021 0600 Gross per 24 hour  Intake 1985.62 ml  Output 1500 ml  Net 485.62 ml   Filed Weights   03/21/2021 0323 03/30/21 0600  Weight: 68 kg 79.7 kg    Examination: General exam: Appears comfortable  HEENT: PERRLA, oral mucosa moist, no sclera icterus or thrush-trach in place Respiratory system: Clear to auscultation. Respiratory effort normal. Cardiovascular system: S1 & S2 heard, RRR.   Gastrointestinal system: Abdomen soft, non-tender, nondistended. Normal bowel sounds. Central nervous system: Alert and oriented-he can mouth words, can nod and shake his head but does not move his arms or legs Extremities: No cyanosis, clubbing -he has diffuse mild to moderate anasarca Skin: No rashes or ulcers Psychiatry:  Mood & affect appropriate.     Data Reviewed: I have personally reviewed following labs and imaging studies  CBC: Recent Labs  Lab 03/28/2021 0443 04/03/2021 0515 03/24/2021 1038 03/30/21 0031  WBC  --  17.0*  --  16.8*  NEUTROABS  --  14.8*  --   --   HGB 15.6 14.3 13.3 14.4  HCT 46.0 45.5 39.0 46.5  MCV  --  82.3  --  83.2  PLT  --  278  --  098   Basic Metabolic Panel: Recent Labs  Lab 04/02/2021 0443 03/30/2021 0515 04/09/2021 0938 04/15/2021 1038 03/30/21 0031  NA 137 136  --  141 139  K 3.8 2.8*  --  2.3* 3.5  CL 103 100  --   --  106  CO2  --  24  --   --  22  GLUCOSE 84 84  --   --  96  BUN 10 7*  --   --  11  CREATININE <0.20* 0.32*  --   --  <0.30*  CALCIUM  --  9.2  --    --  9.2  MG  --   --  1.9  --   --    GFR: CrCl cannot be calculated (This lab value cannot be used to calculate CrCl because it is not a number: <0.30). Liver Function Tests: Recent Labs  Lab 03/28/2021 0515  AST 16  ALT 14  ALKPHOS 84  BILITOT 1.2  PROT 8.1  ALBUMIN 3.1*   No results for input(s): LIPASE, AMYLASE in the last 168 hours. Recent Labs  Lab 03/23/2021 1530  AMMONIA 11   Coagulation Profile: Recent Labs  Lab 04/06/2021 0515  INR 1.3*   Cardiac Enzymes: No results for input(s): CKTOTAL, CKMB, CKMBINDEX, TROPONINI in the last 168 hours. BNP (last 3 results) No results for input(s): PROBNP in the last 8760 hours. HbA1C: No results for input(s): HGBA1C in the last 72 hours. CBG: Recent Labs  Lab 04/17/2021 1215 03/25/2021 1546 03/25/2021 2205 03/30/21 0604  GLUCAP 88 121* 103* 73   Lipid Profile: No results for input(s): CHOL, HDL, LDLCALC, TRIG, CHOLHDL, LDLDIRECT in the last 72 hours. Thyroid Function Tests: No results for input(s): TSH, T4TOTAL, FREET4, T3FREE, THYROIDAB in the last 72 hours. Anemia Panel: No results for input(s): VITAMINB12, FOLATE, FERRITIN, TIBC, IRON, RETICCTPCT in the last 72 hours. Urine analysis:    Component Value Date/Time   COLORURINE AMBER (A) 03/27/2021 0335   APPEARANCEUR CLOUDY (A) 04/02/2021 0335   LABSPEC 1.009 04/14/2021 0335   PHURINE 7.0 04/06/2021 0335   GLUCOSEU >=500 (A) 04/14/2021 0335   HGBUR MODERATE (A) 03/28/2021 0335   BILIRUBINUR NEGATIVE 03/27/2021 0335   KETONESUR NEGATIVE 04/17/2021 0335   PROTEINUR 100 (A) 04/04/2021 0335   NITRITE NEGATIVE 03/26/2021 0335   LEUKOCYTESUR LARGE (A) 03/31/2021 0335   Sepsis Labs: @LABRCNTIP (procalcitonin:4,lacticidven:4) ) Recent Results (from the past 240 hour(s))  Resp Panel by RT-PCR (Flu A&B, Covid) Nasopharyngeal Swab     Status: None   Collection Time: 04/16/2021  3:52 AM   Specimen: Nasopharyngeal Swab; Nasopharyngeal(NP) swabs in vial transport medium   Result Value Ref Range Status   SARS Coronavirus 2 by RT PCR NEGATIVE NEGATIVE Final    Comment: (NOTE) SARS-CoV-2 target nucleic acids are NOT DETECTED.  The SARS-CoV-2 RNA is generally detectable in upper respiratory specimens during the acute phase of infection. The lowest concentration of SARS-CoV-2 viral copies this assay can detect is 138 copies/mL. A negative result does not preclude SARS-Cov-2 infection and should not be used as the sole basis for treatment or other patient management decisions. A negative result may occur with  improper specimen collection/handling, submission of specimen other than nasopharyngeal swab, presence of viral mutation(s) within the areas targeted by this assay, and inadequate number of viral copies(<138 copies/mL). A negative result must be combined with clinical observations, patient history, and epidemiological information. The expected result is Negative.  Fact Sheet for Patients:  EntrepreneurPulse.com.au  Fact Sheet for Healthcare Providers:  IncredibleEmployment.be  This test is no t yet approved or cleared by the [place] FDA and  has been authorized for detection and/or diagnosis of SARS-CoV-2 by FDA under an Emergency Use Authorization (EUA). This EUA will remain  in effect (meaning this test can be used) for the duration of the COVID-19 declaration under Section 564(b)(1) of the Act, 21 U.S.C.section 360bbb-3(b)(1), unless the authorization is terminated  or revoked sooner.       Influenza A by PCR NEGATIVE NEGATIVE Final   Influenza B by PCR NEGATIVE NEGATIVE Final    Comment: (NOTE) The Xpert Xpress SARS-CoV-2/FLU/RSV plus assay is intended as an aid in the diagnosis of influenza from Nasopharyngeal swab specimens and should not be used as a sole basis for treatment. Nasal washings and aspirates are unacceptable for Xpert Xpress SARS-CoV-2/FLU/RSV testing.  Fact Sheet for  Patients: EntrepreneurPulse.com.au  Fact Sheet for Healthcare Providers: IncredibleEmployment.be  This test is not yet  approved or cleared by the [place] and has been authorized for detection and/or diagnosis of SARS-CoV-2 by FDA under an Emergency Use Authorization (EUA). This EUA will remain in effect (meaning this test can be used) for the duration of the COVID-19 declaration under Section 564(b)(1) of the Act, 21 U.S.C. section 360bbb-3(b)(1), unless the authorization is terminated or revoked.  Performed at [facility], [place] [address], [place], [place] [address]   Blood Culture (routine x 2)     Status: None (Preliminary result)   Collection Time: 04/17/2021  5:08 AM   Specimen: BLOOD  Result Value Ref Range Status   Specimen Description   Final    BLOOD LEFT ANTECUBITAL Performed at [facility], [address] [place], [place] [address]    Special Requests   Final    BOTTLES DRAWN AEROBIC AND ANAEROBIC Blood Culture adequate volume Performed at [facility], [address] [place], [place] [address]    Culture  Setup Time   Final    GRAM POSITIVE COCCI IN CLUSTERS IN BOTH AEROBIC AND ANAEROBIC BOTTLES CRITICAL RESULT CALLED TO, READ BACK BY AND VERIFIED WITH: Zoila Shutter [id number] 03/30/2021 Mena Goes Performed at [facility], [place] [address] [place], [place] [address]    Culture GRAM POSITIVE COCCI  Final   Report Status PENDING  Incomplete  Blood Culture ID Panel (Reflexed)     Status: Abnormal   Collection Time: 04/17/2021  5:08 AM  Result Value Ref Range Status   Enterococcus faecalis NOT DETECTED NOT DETECTED Final   Enterococcus Faecium NOT DETECTED NOT DETECTED Final   Listeria monocytogenes NOT DETECTED NOT DETECTED Final   Staphylococcus species DETECTED (A) NOT DETECTED Final    Comment: CRITICAL RESULT CALLED TO, READ BACK BY AND VERIFIED WITH: V. Beecher Mcardle [phone] 03/30/2021 T.  TYSOR    Staphylococcus aureus (BCID) NOT DETECTED NOT DETECTED Final   Staphylococcus epidermidis NOT DETECTED NOT DETECTED Final   Staphylococcus lugdunensis NOT DETECTED NOT DETECTED Final   Streptococcus species NOT DETECTED NOT DETECTED Final   Streptococcus agalactiae NOT DETECTED NOT DETECTED Final   Streptococcus pneumoniae NOT DETECTED NOT DETECTED Final   Streptococcus pyogenes NOT DETECTED NOT DETECTED Final   A.calcoaceticus-baumannii NOT DETECTED NOT DETECTED Final   Bacteroides fragilis NOT DETECTED NOT DETECTED Final   Enterobacterales NOT DETECTED NOT DETECTED Final   Enterobacter cloacae complex NOT DETECTED NOT DETECTED Final   Escherichia coli NOT DETECTED NOT DETECTED Final   Klebsiella aerogenes NOT DETECTED NOT DETECTED Final   Klebsiella oxytoca NOT DETECTED NOT DETECTED Final   Klebsiella pneumoniae NOT DETECTED NOT DETECTED Final   Proteus species NOT DETECTED NOT DETECTED Final   Salmonella species NOT DETECTED NOT DETECTED Final   Serratia marcescens NOT DETECTED NOT DETECTED Final   Haemophilus influenzae NOT DETECTED NOT DETECTED Final   Neisseria meningitidis NOT DETECTED NOT DETECTED Final   Pseudomonas aeruginosa NOT DETECTED NOT DETECTED Final   Stenotrophomonas maltophilia NOT DETECTED NOT DETECTED Final   Candida albicans NOT DETECTED NOT DETECTED Final   Candida auris NOT DETECTED NOT DETECTED Final   Candida glabrata NOT DETECTED NOT DETECTED Final   Candida krusei NOT DETECTED NOT DETECTED Final   Candida parapsilosis NOT DETECTED NOT DETECTED Final   Candida tropicalis NOT DETECTED NOT DETECTED Final   Cryptococcus neoformans/gattii NOT DETECTED NOT DETECTED Final    Comment: Performed at [facility], [address], [place], [place]  [address]  Expectorated Sputum Assessment w Gram Stain, Rflx to Resp Cult     Status: None   Collection Time: 04/17/2021  8:31 AM   Specimen: Expectorated Sputum  Result Value Ref Range Status   Specimen  Description EXPECTORATED SPUTUM  Final   Special Requests NONE  Final   Sputum evaluation   Final    THIS SPECIMEN IS ACCEPTABLE FOR SPUTUM CULTURE Performed at [facility], [place] [address] [place], [place] [address]    Report Status 03/21/2021 FINAL  Final  MRSA Next Gen by PCR, Nasal     Status: None   Collection Time: 04/17/2021  8:31 AM  Result Value Ref Range Status   MRSA by PCR Next Gen NOT DETECTED NOT DETECTED Final    Comment: (NOTE) The GeneXpert MRSA Assay (FDA approved for NASAL specimens only), is one component of a comprehensive MRSA colonization surveillance program. It is not intended to diagnose MRSA infection nor to guide or monitor treatment for MRSA infections. Test performance is not FDA approved in patients less than 72 years old. Performed at [facility], [place] [address], [place], [place] [address]   Culture, Respiratory w Gram Stain     Status: None (Preliminary result)   Collection Time: 04/17/2021  8:31 AM  Result Value Ref Range Status   Specimen Description EXPECTORATED SPUTUM  Final   Special Requests NONE Reflexed from [id number]  Final   Gram Stain   Final    ABUNDANT WBC PRESENT, PREDOMINANTLY PMN RARE GRAM POSITIVE COCCI Performed at [facility], [place] [address] [place], [place] [address]    Culture PENDING  Incomplete   Report Status PENDING  Incomplete         Radiology Studies: CT CHEST WO CONTRAST  Result Date: 03/28/2021 CLINICAL DATA:  Pneumonia with effusion or abscess suspected. New hypoxia. EXAM: CT CHEST WITHOUT CONTRAST TECHNIQUE: Multidetector CT imaging of the chest was performed following the standard protocol without IV contrast. COMPARISON:  Radiograph from earlier today.  Chest CT 07/27/2018 FINDINGS: Cardiovascular: Normal heart size. No pericardial effusion. Mild atheromatous calcification of the aorta and coronaries. Mediastinum/Nodes: Generous sized lymph nodes which may be reactive in this setting. Chronic  calcified mediastinal nodes from remote granulomatous disease. Lungs/Pleura: Tracheostomy tube in place. Central airway debris at both mainstem bronchi with layering. Extensive consolidative opacity with some volume loss on the left lung where there is a small parapneumonic effusion. No cavitary changes. More atelectatic type opacity at the right base. No edema. No air leak. Upper Abdomen: Negative Musculoskeletal: No acute finding IMPRESSION: 1. Extensive pneumonia on the left. Recommend follow-up to clearing. 2. Extensive airway debris narrowing bilateral mainstem bronchi. 3. Small pleural effusions. Electronically Signed   By: Monte Fantasia M.D.   On: 03/27/2021 11:14   DG Chest Port 1 View  Result Date: 03/28/2021 CLINICAL DATA:  Concern for sepsis EXAM: PORTABLE CHEST 1 VIEW COMPARISON:  Radiograph 08/20/2018, CT 07/27/2018 FINDINGS: Confluent masslike opacities present in the left mid lung and retrocardiac space. No pneumothorax or visible effusion. Asymmetrically diminished left lung volumes are similar to the recent comparison radiography albeit with a more normal appearance on the somewhat more remote CT comparison. Tracheostomy tube in place. Cardiomediastinal contours are stable from prior with a calcified aorta. Degenerative changes are present in the imaged spine and shoulders. No acute osseous or soft tissue abnormality. IMPRESSION: Confluent masslike opacities present in the left mid lung. While this may be infectious/pneumonia in the  setting of suspected sepsis, underlying mass is not fully excluded. Could consider short-term (6 week) repeat imaging following therapeutic intervention. Otherwise, consider CT imaging for further characterization. Electronically Signed   By: Lovena Le M.D.   On: 04/14/2021 04:24      Scheduled Meds:  atorvastatin  20 mg Oral QPM   brimonidine  1 drop Both Eyes TID   carvedilol  6.25 mg Oral BID WC   chlorhexidine gluconate (MEDLINE KIT)  15 mL Mouth  Rinse BID   Chlorhexidine Gluconate Cloth  6 each Topical Daily   dorzolamide-timolol  1 drop Both Eyes TID   enoxaparin (LOVENOX) injection  40 mg Subcutaneous Q24H   famotidine  20 mg Oral BID   folic acid  1 mg Oral Daily   furosemide  20 mg Oral Daily   insulin aspart  0-5 Units Subcutaneous QHS   insulin aspart  0-9 Units Subcutaneous TID WC   ipratropium-albuterol  3 mL Nebulization Q8H   lactulose  20 g Oral Daily   latanoprost  1 drop Both Eyes QHS   losartan  25 mg Oral Daily   mouth rinse  15 mL Mouth Rinse 10 times per day   PARoxetine  20 mg Oral q morning   potassium chloride  20 mEq Oral BID   sodium chloride HYPERTONIC  4 mL Nebulization BID   Continuous Infusions:  ceFEPime (MAXIPIME) IV 2 g (03/30/21 0607)     LOS: 1 day      Debbe Odea, MD [facility] Pager: [email or web address] 03/30/2021, 7:39 AM

## 2021-03-30 NOTE — Procedures (Signed)
Ventilator Management Note  Javon Snee  [id number]  10-14-1956  Date:03/30/21  Time:9:27 AM   Provider Performing:Haadiya Frogge L Adric Wrede   Procedure: Ventilator management  Indication(s) Chronic hypoxemic respiratory failure, ALS, chronic vent support  Description Patient remains on chronic vent support.  Currently stable.  No changes in vent settings.  Vent Mode: PRVC FiO2 (%):  [35 %-80 %] 40 % Set Rate:  [16 bmp] 16 bmp Vt Set:  [560 mL] 560 mL PEEP:  [5 cmH20] 5 cmH20 Plateau Pressure:  [16 cmH20-23 cmH20] 20 cmH20  Vent settings reviewed.  Complications/Tolerance Chest x-ray from yesterday reviewed.  Remainder of medical management per primary.  Josephine Igo, DO [facility] 03/30/2021 9:29 AM

## 2021-03-30 NOTE — Progress Notes (Signed)
PHARMACY - PHYSICIAN COMMUNICATION CRITICAL VALUE ALERT - BLOOD CULTURE IDENTIFICATION (BCID)  Wayne Young is an 65 y.o. male who presented to [facility] on 2021-03-31 with a chief complaint of low O2 sats.  Assessment:  Started on broad-spectrum ABX, now w/ staph spp growing in 2 of 4 bottles (1 full set), likely contaminant.  Name of physician (or Provider) ContactedGardiner Ramus MD  Current antibiotics: cefepime  Changes to prescribed antibiotics recommended:  No changes needed.  Results for orders placed or performed during the hospital encounter of 31-Mar-2021  Blood Culture ID Panel (Reflexed) (Collected: 03-31-21  5:08 AM)  Result Value Ref Range   Enterococcus faecalis NOT DETECTED NOT DETECTED   Enterococcus Faecium NOT DETECTED NOT DETECTED   Listeria monocytogenes NOT DETECTED NOT DETECTED   Staphylococcus species DETECTED (A) NOT DETECTED   Staphylococcus aureus (BCID) NOT DETECTED NOT DETECTED   Staphylococcus epidermidis NOT DETECTED NOT DETECTED   Staphylococcus lugdunensis NOT DETECTED NOT DETECTED   Streptococcus species NOT DETECTED NOT DETECTED   Streptococcus agalactiae NOT DETECTED NOT DETECTED   Streptococcus pneumoniae NOT DETECTED NOT DETECTED   Streptococcus pyogenes NOT DETECTED NOT DETECTED   A.calcoaceticus-baumannii NOT DETECTED NOT DETECTED   Bacteroides fragilis NOT DETECTED NOT DETECTED   Enterobacterales NOT DETECTED NOT DETECTED   Enterobacter cloacae complex NOT DETECTED NOT DETECTED   Escherichia coli NOT DETECTED NOT DETECTED   Klebsiella aerogenes NOT DETECTED NOT DETECTED   Klebsiella oxytoca NOT DETECTED NOT DETECTED   Klebsiella pneumoniae NOT DETECTED NOT DETECTED   Proteus species NOT DETECTED NOT DETECTED   Salmonella species NOT DETECTED NOT DETECTED   Serratia marcescens NOT DETECTED NOT DETECTED   Haemophilus influenzae NOT DETECTED NOT DETECTED   Neisseria meningitidis NOT DETECTED NOT DETECTED   Pseudomonas aeruginosa NOT  DETECTED NOT DETECTED   Stenotrophomonas maltophilia NOT DETECTED NOT DETECTED   Candida albicans NOT DETECTED NOT DETECTED   Candida auris NOT DETECTED NOT DETECTED   Candida glabrata NOT DETECTED NOT DETECTED   Candida krusei NOT DETECTED NOT DETECTED   Candida parapsilosis NOT DETECTED NOT DETECTED   Candida tropicalis NOT DETECTED NOT DETECTED   Cryptococcus neoformans/gattii NOT DETECTED NOT DETECTED    Vernard Gambles, PharmD, BCPS  03/30/2021  6:25 AM

## 2021-03-31 DIAGNOSIS — I10 Essential (primary) hypertension: Secondary | ICD-10-CM | POA: Diagnosis not present

## 2021-03-31 DIAGNOSIS — E785 Hyperlipidemia, unspecified: Secondary | ICD-10-CM | POA: Diagnosis not present

## 2021-03-31 DIAGNOSIS — J189 Pneumonia, unspecified organism: Secondary | ICD-10-CM

## 2021-03-31 LAB — BASIC METABOLIC PANEL
Anion gap: 16 — ABNORMAL HIGH (ref 5–15)
BUN: 15 mg/dL (ref 8–23)
CO2: 17 mmol/L — ABNORMAL LOW (ref 22–32)
Calcium: 9.2 mg/dL (ref 8.9–10.3)
Chloride: 106 mmol/L (ref 98–111)
Creatinine, Ser: 0.53 mg/dL — ABNORMAL LOW (ref 0.61–1.24)
GFR, Estimated: >60 mL/min (ref >60)
Glucose, Bld: 129 mg/dL — ABNORMAL HIGH (ref 70–99)
Potassium: 2.8 mmol/L — ABNORMAL LOW (ref 3.5–5.1)
Sodium: 139 mmol/L (ref 135–145)

## 2021-03-31 LAB — CBC
HCT: 41.5 % (ref 39.0–52.0)
Hemoglobin: 13 g/dL (ref 13.0–17.0)
MCH: 26.5 pg (ref 26.0–34.0)
MCHC: 31.3 g/dL (ref 30.0–36.0)
MCV: 84.5 fL (ref 80.0–100.0)
Platelets: 255 10*3/uL (ref 150–400)
RBC: 4.91 MIL/uL (ref 4.22–5.81)
RDW: 16.6 % — ABNORMAL HIGH (ref 11.5–15.5)
WBC: 12.1 10*3/uL — ABNORMAL HIGH (ref 4.0–10.5)
nRBC: 0 % (ref 0.0–0.2)

## 2021-03-31 LAB — LEGIONELLA PNEUMOPHILA SEROGP 1 UR AG: L. pneumophila Serogp 1 Ur Ag: NEGATIVE

## 2021-03-31 LAB — GLUCOSE, CAPILLARY
Glucose-Capillary: 116 mg/dL — ABNORMAL HIGH (ref 70–99)
Glucose-Capillary: 110 mg/dL — ABNORMAL HIGH (ref 70–99)
Glucose-Capillary: 115 mg/dL — ABNORMAL HIGH (ref 70–99)

## 2021-03-31 LAB — HEMOGLOBIN A1C
Hgb A1c MFr Bld: 6.7 % — ABNORMAL HIGH (ref 4.8–5.6)
Mean Plasma Glucose: 146 mg/dL

## 2021-03-31 LAB — MAGNESIUM: Magnesium: 2.1 mg/dL (ref 1.7–2.4)

## 2021-03-31 MED ORDER — SENNOSIDES-DOCUSATE SODIUM 8.6-50 MG PO TABS
1.0000 | ORAL_TABLET | Freq: Every day | ORAL | Status: DC
  Administered 2021-03-31 – 2021-04-02 (×3): 1 via ORAL
  Filled 2021-03-31 (×3): qty 1

## 2021-03-31 MED ORDER — METOPROLOL TARTRATE 5 MG/5ML IV SOLN
2.5000 mg | Freq: Three times a day (TID) | INTRAVENOUS | Status: DC
  Administered 2021-03-31 – 2021-04-03 (×8): 2.5 mg via INTRAVENOUS
  Filled 2021-03-31 (×8): qty 5

## 2021-03-31 MED ORDER — POTASSIUM CHLORIDE 10 MEQ/100ML IV SOLN
10.0000 meq | INTRAVENOUS | Status: AC
  Administered 2021-03-31 (×2): 10 meq via INTRAVENOUS
  Filled 2021-03-31 (×2): qty 100

## 2021-03-31 MED ORDER — POTASSIUM CHLORIDE 20 MEQ PO PACK: 40.0000 meq | PACK | Freq: Once | ORAL | Status: DC

## 2021-03-31 MED ORDER — POTASSIUM CHLORIDE 20 MEQ PO PACK
40.0000 meq | PACK | Freq: Once | ORAL | Status: AC
  Administered 2021-03-31: 40 meq via ORAL
  Filled 2021-03-31: qty 2

## 2021-03-31 MED ORDER — SODIUM CHLORIDE 0.9 % IV SOLN
2.0000 g | Freq: Three times a day (TID) | INTRAVENOUS | Status: DC
  Administered 2021-03-31 – 2021-04-01 (×2): 2 g via INTRAVENOUS
  Filled 2021-03-31 (×2): qty 2

## 2021-03-31 MED ORDER — POTASSIUM CHLORIDE 10 MEQ/100ML IV SOLN: 10.0000 meq | INTRAVENOUS | Status: DC

## 2021-03-31 MED ORDER — LORAZEPAM 0.5 MG PO TABS
0.5000 mg | ORAL_TABLET | Freq: Every day | ORAL | Status: DC
  Administered 2021-03-31 – 2021-04-02 (×3): 0.5 mg via ORAL
  Filled 2021-03-31 (×3): qty 1

## 2021-03-31 MED ORDER — FAMOTIDINE IN NACL 20-0.9 MG/50ML-% IV SOLN
20.0000 mg | Freq: Two times a day (BID) | INTRAVENOUS | Status: DC
  Administered 2021-03-31 – 2021-04-09 (×20): 20 mg via INTRAVENOUS
  Filled 2021-03-31 (×21): qty 50

## 2021-03-31 MED ORDER — POTASSIUM CHLORIDE 10 MEQ/100ML IV SOLN
10.0000 meq | INTRAVENOUS | Status: DC
  Administered 2021-03-31 (×2): 10 meq via INTRAVENOUS
  Filled 2021-03-31 (×2): qty 100

## 2021-03-31 MED ORDER — VANCOMYCIN HCL 1250 MG/250ML IV SOLN
1250.0000 mg | Freq: Once | INTRAVENOUS | Status: AC
  Administered 2021-03-31: 1250 mg via INTRAVENOUS
  Filled 2021-03-31: qty 250

## 2021-03-31 MED ORDER — POTASSIUM CHLORIDE 10 MEQ/100ML IV SOLN
10.0000 meq | INTRAVENOUS | Status: AC
  Administered 2021-03-31 (×3): 10 meq via INTRAVENOUS
  Filled 2021-03-31 (×2): qty 100

## 2021-03-31 MED ORDER — VANCOMYCIN HCL 750 MG/150ML IV SOLN
750.0000 mg | Freq: Two times a day (BID) | INTRAVENOUS | Status: DC
  Administered 2021-03-31: 750 mg via INTRAVENOUS
  Filled 2021-03-31 (×2): qty 150

## 2021-03-31 MED ORDER — POTASSIUM CHLORIDE CRYS ER 20 MEQ PO TBCR
40.0000 meq | EXTENDED_RELEASE_TABLET | ORAL | Status: DC
  Filled 2021-03-31: qty 2

## 2021-03-31 MED ORDER — FOLIC ACID 5 MG/ML IJ SOLN
1.0000 mg | Freq: Every day | INTRAMUSCULAR | Status: DC
  Administered 2021-03-31 – 2021-04-09 (×10): 1 mg via INTRAVENOUS
  Filled 2021-03-31 (×12): qty 0.2

## 2021-03-31 MED ORDER — FAMOTIDINE 20 MG PO TABS: 20.0000 mg | ORAL_TABLET | Freq: Two times a day (BID) | ORAL | Status: DC

## 2021-03-31 MED ORDER — POTASSIUM CHLORIDE CRYS ER 20 MEQ PO TBCR: 40.0000 meq | EXTENDED_RELEASE_TABLET | Freq: Once | ORAL | Status: DC

## 2021-03-31 NOTE — Progress Notes (Signed)
Potassium 2.8 to be replaced per Dr. Benjamin Stain. Give 60 mEq now (40 mEq per packet and 20 mEq per peripheral IV), and administer the scheduled 20 meq BID dose.   Sunny Schlein Jes Costales DNP [facility] RN

## 2021-03-31 NOTE — TOC Initial Note (Signed)
Transition of Care [facility]) - Initial/Assessment Note    Patient Details  Name: Wayne Young MRN: [id number] Date of Birth: 1956-08-05  Transition of Care [facility]) CM/SW Contact:    Gala Lewandowsky, RN Phone Number: 03/31/2021, 4:58 PM  Clinical Narrative:  Patient is currently active with [facility] Adult [facility] Duty [facility] with home trach and vent. Patient gets 112 hours a week of services from a Charity fundraiser. Burman Nieves Adult private duty manager called to make the staff aware that the patient is active with services-please call Molly Maduro with any updates at [phone] and please fax all orders and d/c summary to [phone] once patient is stable to transition home. Frances Furbish will need a 24 hour notice prior to discharging the patient back home with services to align nursing staff and Molly Maduro asked if the patient can be on his home vent for 24 hours prior to transition back home. Case Manager will continue to follow for additional transition of care needs.           Expected Discharge Plan: Home w Home Health Services Barriers to Discharge: Continued Medical Work up   Patient Goals and CMS Choice Patient states their goals for this hospitalization and ongoing recovery are:: Patient is from home with [facility] Private Duty:   Choice offered to / list presented to : NA  Expected Discharge Plan and Services Expected Discharge Plan: Home w Home Health Services   Discharge Planning Services: CM Consult Post Acute Care Choice: Home Health Living arrangements for the past 2 months: Single Family Home                   DME Agency: NA       HH Arranged: RN HH Agency: [facility] Date [facility] Agency Contacted: 03/31/21 Time HH Agency Contacted: 1300 Representative spoke with at [facility] Agency: Frances Furbish  Prior Living Arrangements/Services Living arrangements for the past 2 months: Single Family Home Lives with:: Spouse Patient language and need for interpreter reviewed::  Yes Do you feel safe going back to the place where you live?: Yes      Need for Family Participation in Patient Care: Yes (Comment) Care giver support system in place?: Yes (comment) Current home services: Home RN (With Adult Private Duty at [facility] Criminal Activity/Legal Involvement Pertinent to Current Situation/Hospitalization: No - Comment as needed  Activities of Daily Living      Permission Sought/Granted Permission sought to share information with : Family Supports, Magazine features editor, Case Automotive engineer granted to share info w AGENCY: Frances Furbish- Adult Private Duty Burman Nieves reached out to Case Manager at [phone] fax [phone]        Emotional Assessment Appearance:: Appears stated age         Psych Involvement: No (comment)  Admission diagnosis:  CAP (community acquired pneumonia) [J18.9] HCAP (healthcare-associated pneumonia) [J18.9] Acute on chronic respiratory failure with hypoxia (HCC) [J96.21] Patient Active Problem List   Diagnosis Date Noted   CAP (community acquired pneumonia) 04/17/2021   Hypokalemia 03/28/2021   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/01/2021   Hypoalbuminemia 04/07/2021   History of TB (tuberculosis) 04/08/2021   Acute on chronic respiratory failure with hypoxia (HCC) 04/12/2021   ALS (amyotrophic lateral sclerosis) (HCC) 02/07/2021   Acute on chronic respiratory failure with hypoxemia (HCC) 08/09/2019   Hyperlipidemia 08/09/2019   Hypertension 08/09/2019   Type 2 diabetes mellitus, with long-term current use of insulin (HCC) 08/09/2019   Consolidation lung (HCC)  Acute respiratory distress    Encounter for imaging study to confirm orogastric (OG) tube placement    Self extubation    Aspiration into airway    Pressure injury of skin 08/04/2018   Atelectasis of left lung    Protein-calorie malnutrition, severe 07/28/2018   Recent unintentional weight loss over several months    Acute respiratory  failure (HCC) 07/25/2018   Positive anti-CCP test 07/13/2018   Positive QuantiFERON-TB Gold test 07/13/2018   Rheumatoid arthritis with positive rheumatoid factor (HCC) 07/13/2018   Incidental pulmonary nodule, > 17mm and < 14mm 07/13/2018   At risk for sexually transmitted disease due to 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 [place] ([place] [address] Phone: [phone] Fax: [phone]  [facility] - [place],  - [address] [place] [address] Phone: [phone] Fax: [phone]   Readmission Risk Interventions No flowsheet data found.

## 2021-03-31 NOTE — Progress Notes (Signed)
Pharmacy Antibiotic Note  Wayne Young is a 65 y.o. male admitted on 04/28/21 with pneumonia.  Pharmacy has been consulted for Vancomycin and cefepime dosing. Patient with ALS and likely decreased muscle mass  Crcl 62ml/min wbc 12 afebrile  Respiratory cx with Proteus and staph aureus  BCx GPC  - will resume vanc until cx results and sensitivities are final   Plan: Vancomycin 1250mg  IV x1 then 750mg  q12h Goal AUC 400-550. SCr used: 0.8 (used as estimate in setting of low muscle mass) Cefepime 2gm iv q8h  Follow-up MRSA PCR  Height: 5\' 9"  (175.3 cm) Weight: 79.7 kg (175 lb 11.3 oz) IBW/kg (Calculated) : 70.7  Temp (24hrs), Avg:97.9 F (36.6 C), Min:97.5 F (36.4 C), Max:98.6 F (37 C)  Recent Labs  Lab 28-Apr-2021 0443 Apr 28, 2021 0515 04/28/2021 0519 03/30/21 0031 03/31/21 0350  WBC  --  17.0*  --  16.8* 12.1*  CREATININE <0.20* 0.32*  --  <0.30* 0.53*  LATICACIDVEN  --   --  1.6  --   --      Estimated Creatinine Clearance: 89.6 mL/min (A) (by C-G formula based on SCr of 0.53 mg/dL (L)).    No Known Allergies  Antimicrobials this admission: Vanc 6/11 resume 6/13 Cefepime 6/11 >   Dose adjustments this admission:   Microbiology results:  6/11 sputum proteus, staph aureus 6/11 BCx GPC    8/11 Pharm.D. CPP, BCPS Clinical Pharmacist [phone] 03/31/2021 11:56 AM

## 2021-03-31 NOTE — Evaluation (Signed)
[facility] Speaking Valve - Evaluation Patient Details  Name: Amear Strojny MRN: [id number] Date of Birth: April 03, 1956  Today's Date: 03/31/2021 Time: 1030-1050 SLP Time Calculation (min) (ACUTE ONLY): 20 min  Past Medical History:  Past Medical History:  Diagnosis Date   Arthritis    Diabetes mellitus without complication (HCC)    Glaucoma    Hypertension    Past Surgical History:  Past Surgical History:  Procedure Laterality Date   HERNIA REPAIR     HPI:  D'Arcy Abraha is a 65 y.o. male with medical history significant of ALS s/p tracheostomy on home ventilator, hypertension, diabetes mellitus type 2, rheumatoid arthritis, remote history of TB, and glaucoma presents after being noted to have low oxygen saturations at home. Dx with sepsis secondary to CAP. CXR significant for confluent masslike opacities in the lift mid lung. Further CT since initial eval shows Extensive pneumonia on the left. Extensive airway debris narrowing bilateral mainstem bronchi.   Assessment / Plan / Recommendation Clinical Impression  Pt seen for initial inline PMSV trial with RT, SLP, MD, RN and NT at bedside. Pt apparently more lethargic today than previously. Today, pt did not eat much of his modified meal due to dislike. There is also heightened concern for chronic aspiration given CT finding of debris in bronchi. No PO trials of swallowing therapy attempted as pt had no interest in PO and risk deemed high. Attempted PMSV to assist with communication with providers.   Pt toelrated cuff deflation with patent airway given decrease in Expired volumes of over 50%. RT increased volume gradually from 560 to 760 to resume baseline pressure of 20. PEEP could not be dropped on this ventilator per RT, so it remained at 5. Pt had obvious redirection of air to upper airway and stable vitals, but did not make much effort to phonate despite max cueing and head repositioning by SLP. He appeared lethargic. Pt did  continue to communicate with SLP and MD questions via head nods and shakes and indicated awareness of dysphagia and agreed to current plan of NPO until further decisions and discussions can progress. Pt would benefit from further inline PMSV use when wife and palliative care team is present given need for decisions regarding ongoing intake for pleasure/comfort or potential PEG tube placement. Please contact SLP when/if meeting may occur (look in care team assignment or call office at [phone])  SLP Visit Diagnosis: Aphonia (R49.1)    SLP Assessment  Patient needs continued Speech Lanaguage Pathology Services    Follow Up Recommendations       Frequency and Duration min 3x week  2 weeks    PMSV Trial PMSV was placed for: 10 minutes Able to redirect subglottic air through upper airway: Yes Able to Attain Phonation:  (minimal) Voice Quality: Aphonic Able to Expectorate Secretions: No attempts Breath Support for Phonation: Severely decreased Intelligibility: Intelligibility reduced Word: 0-24% accurate Phrase: Not tested Sentence: Not tested Conversation: Not tested Respirations During Trial: 20 SpO2 During Trial: 98 %   Tracheostomy Tube       Vent Dependency  Vent Mode: PRVC Set Rate: 16 bmp PEEP: 5 cmH20 FiO2 (%): 40 % Vt Set: 560 mL    Cuff Deflation Trial  GO Tolerated Cuff Deflation: Yes Length of Time for Cuff Deflation Trial: 10 minutes Behavior:  (lethargic, requires cueing)        Melena Hayes, Riley Nearing 03/31/2021, 11:21 AM

## 2021-03-31 NOTE — Plan of Care (Signed)
  Problem: Activity: Goal: Ability to tolerate increased activity will improve 03/31/2021 2233 by Wyvonnia Dusky, RN Outcome: Progressing 03/31/2021 2233 by Wyvonnia Dusky, RN Outcome: Progressing   Problem: Respiratory: Goal: Ability to maintain a clear airway and adequate ventilation will improve Outcome: Progressing   Problem: Role Relationship: Goal: Method of communication will improve Outcome: Progressing   Problem: Activity: Goal: Ability to tolerate increased activity will improve Outcome: Progressing   Problem: Clinical Measurements: Goal: Ability to maintain a body temperature in the normal range will improve Outcome: Progressing   Problem: Respiratory: Goal: Ability to maintain adequate ventilation will improve Outcome: Progressing Goal: Ability to maintain a clear airway will improve Outcome: Progressing

## 2021-03-31 NOTE — Progress Notes (Signed)
Pt refused oral meds with apple sauce. Pt NPO with the exception of meds with puree. Pt did accept IV medication, eye drops, and sub q medication. This RN notified Misty Stanley Pharmacist who will see what can be switched to IV form, also notified Dr. Butler Denmark.

## 2021-03-31 NOTE — Progress Notes (Signed)
Initial Nutrition Assessment  DOCUMENTATION CODES:   Not applicable  INTERVENTION:   Tube Feeding recommendations if/when feeding tube placement:  Osmolite 1.5 at 60 ml/hr Pro-Stat 45 mL daily Provides 101 g of protein, 2200 kcals and 1094 mL of free water  NUTRITION DIAGNOSIS:   Inadequate oral intake related to dysphagia, chronic illness as evidenced by NPO status.  GOAL:   Patient will meet greater than or equal to 90% of their needs  MONITOR:   TF tolerance, Labs, Weight trends, Skin  REASON FOR ASSESSMENT:   Ventilator    ASSESSMENT:   65 yo male with ALS and chronic respiratory failure s/p trach on home vent and admitted with acute on chronic respiratory failure with suspected aspiration pneumonia, severe sepsis  Pt looks at RD but does not conversed. Unable to obtain diet history from patient at this time  Pt refuses puree consistency as well as nectar thick liquids. NPO at present as not safe for regular consistency with thin liquids.   Noted discussions surrounding PEG tube and at present, pt would like to pursue PEG placement and IR has been consulted  Per weight encounters, pt has actually gained weight since last assessed by Clinical RD Team. Pt does have significant edema however, so unsure of actual dry weight.   Pt with significant muscle wasting on exam, subcutaneous fat loss but only observed in face. Pt with edema which may be masking some losses. Pt with ALS, function quad and bed bound status which can explain degree of muscle wasting regardless of nutritional status. Subcutaneous fat loss however likely related to nutritional status  Labs:potassium 3.4 (L-improved from 2.8) Meds: NS with KCl at 125 ml/hr, folic acid, KCl, senna-docusate   NUTRITION - FOCUSED PHYSICAL EXAM:  Flowsheet Row Most Recent Value  Orbital Region Moderate depletion  Upper Arm Region No depletion  Thoracic and Lumbar Region No depletion  Buccal Region Moderate  depletion  Temple Region Mild depletion  Clavicle Bone Region Severe depletion  Clavicle and Acromion Bone Region Severe depletion  Scapular Bone Region Severe depletion  Dorsal Hand Unable to assess  Patellar Region Unable to assess  Anterior Thigh Region Unable to assess  Posterior Calf Region Unable to assess  Edema (RD Assessment) Severe       Diet Order:   Diet Order             Diet NPO time specified Except for: Sips with Meds  Diet effective now                   EDUCATION NEEDS:   Not appropriate for education at this time  Skin:  Skin Assessment: Skin Integrity Issues: Skin Integrity Issues:: Stage I Stage I: sacrum  Last BM:  6/12  Height:   Ht Readings from Last 1 Encounters:  2021-04-07 5\' 9"  (1.753 m)    Weight:   Wt Readings from Last 1 Encounters:  03/30/21 79.7 kg      BMI:  Body mass index is 25.95 kg/m.  Estimated Nutritional Needs:   Kcal:  2000-2200 kcals  Protein:  100-115 g  Fluid:  >/= 2 L   05/30/21 MS, RDN, LDN, CNSC Registered Dietitian III Clinical Nutrition RD Pager and On-Call Pager Number Located in [place]

## 2021-03-31 NOTE — Progress Notes (Addendum)
This RN spoke with pts brother STAR CHEESE who was directed by pts wife to call for updates. Lj was updated about pts status and tentative discussions about feeding tube placement, he was also made aware that palliative had been consulted and may try to meet with pts wife [place] tomorrow. LJ would also like to be a part of the conversation if possible, along with their brother Raymon Spiegelman. Both brothers contact information is in pts snapshot under contacts, LJ's number is [phone]. Ellard Artis is planning to visit the pt tomorrow. Raymons number is [phone], he would like to be called to be a part of the conversation if possible.

## 2021-03-31 NOTE — Progress Notes (Signed)
This RN spoke with pts wife Praveen Coia, updated her about pt having palliative consult ordered. Pts wife would like to be called at either [phone] or [phone] and she will come to hospital to meet with palliative. She states that it takes her about 15 minutes to get to the hospital.

## 2021-03-31 NOTE — Progress Notes (Addendum)
PROGRESS NOTE    Wayne Young   GHW:[id number]  DOB: 06/14/1956  DOA: 04/17/2021 PCP: Cipriano Mile, NP   Brief Narrative:  Wayne Young is a 65 year old male with rheumatoid arthritis, ALS with a trach on home vent, diabetes mellitus, hypertension. He was brought to the hospital for oxygen saturations that were in the 80s after nebulizer treatments. In the ED: Temperature 101, elevated respiratory rate and mildly tachycardic. WBC count 17, potassium 2.8 Anasarca was noted on exam  CT scan of the chest reveals extensive left lung opacities and debris in the mainstem bronchus. He was started on vancomycin and cefepime for sepsis secondary to pneumonia.   Subjective: He has no complaints today.     Assessment & Plan:   Principal Problem:   Acute on chronic respiratory failure with hypoxia-ventilator related pneumonia Severe sepsis Immune compromised state -Extensive consolidation in the left lung with a small effusion-central airway debris in both mainstem bronchi -Tachycardia improving WBC count is elevated at 16.8, procalcitonin 1.17> WBC improved to 12 - Strep pneumo urinary antigen negative, HIV negative -Sputum culture growing rare staph aureus and Proteus mirabilis-sensitivities pending -He is on cefepime-vancomycin discontinued yesterday as MRSA PCR was negative-PCCM would like it to continue until full culture results  Active Problems:  ALS (amyotrophic lateral sclerosis)  -He is wheelchair-bound and has a trach - SLP is advising today that he be strictly. n.p.o. and pursue PEG versus hospice-I have had a discussion with the patient and his wife who states that they have had these discussions in the past-patient agrees that he would like to have a PEG tube but is also very sleepy and I am not certain if he is fully understanding my conversation -Consulted palliative care to also have this conversation with them  BCID positive for Staphylococcus species UA  suggestive of a UTI - Culture is revealing multiple species in the urine - 1 set of blood culture growing gram-positive cocci in clusters-this revealed staph capitis and is likely a contaminant    Hypokalemia - related to oral Lasix? -Potassium 2.8-has improved to 3.5 -has dropped again to 2.8-  replacing via IV as he is declining tablets - K down to 2.8- replacing per tube and 20 meq IV ordered this AM- check Mg -He is supposed to be taking 20 mEq twice daily at home-   Anasarca - He is on 20 mg of Lasix daily-he states he drinks a lot of fluid at home and his wife agrees -EF in 12/21 was 60 to 65% with mild LVH - Albumin level 3.1 - Prealbumin is 10 - advised to cut back on fluid intake- have placed a 1200 cc fluid restriction- follow w/o increasing in lasix dose yet > Now n.p.o., will DC Lasix     Rheumatoid arthritis with positive rheumatoid factor  -He receives methotrexate every Thursday    Hyperlipidemia -Lipitor being continued    Hypertension -Continue carvedilol losartan, furosemide ad PRN IV Labetalol and PRN IV Hydralazine    Type 2 diabetes mellitus, with long-term current use of insulin  -Empagliflozin, glipizide on hold - oral intake & appetite has been poor lately poor - As he is n.p.o., have discontinued NovoLog sliding scale to prevent hypoglycemia-continue to follow glucose levels    History of TB (tuberculosis)   Time spent in minutes: 45-extensive conversation with patient and wife in regards to PEG tube and his aspiration risks DVT prophylaxis: enoxaparin (LOVENOX) injection 40 mg Start: 03/31/2021 1000 Code Status: Full code Family Communication:  Level of Care: Level of care: ICU-due to being on a ventilator Disposition Plan:  Status is: Inpatient  Remains inpatient appropriate because:IV treatments appropriate due to intensity of illness or inability to take PO  Dispo: The patient is from: Home              Anticipated d/c is to: Home               Patient currently is not medically stable to d/c.   Difficult to place patient No      Consultants:  Pulmonary critical care managing ventilator Procedures:   Antimicrobials:  Anti-infectives (From admission, onward)    Start     Dose/Rate Route Frequency Ordered Stop   04/04/2021 1700  vancomycin (VANCOREADY) IVPB 1000 mg/200 mL  Status:  Discontinued        1,000 mg 200 mL/hr over 60 Minutes Intravenous Every 12 hours 04/04/2021 0943 03/30/2021 1115   04/13/2021 1300  ceFEPIme (MAXIPIME) 2 g in sodium chloride 0.9 % 100 mL IVPB        2 g 200 mL/hr over 30 Minutes Intravenous Every 8 hours 04/01/2021 0833     04/02/2021 1300  vancomycin (VANCOREADY) IVPB 1500 mg/300 mL  Status:  Discontinued        1,500 mg 150 mL/hr over 120 Minutes Intravenous Every 8 hours 04/15/2021 0941 04/15/2021 0943   04/03/2021 0530  vancomycin (VANCOREADY) IVPB 1000 mg/200 mL  Status:  Discontinued        1,000 mg 200 mL/hr over 60 Minutes Intravenous  Once 04/07/2021 0517 03/25/2021 0518   03/20/2021 0530  ceFEPIme (MAXIPIME) 2 g in sodium chloride 0.9 % 100 mL IVPB        2 g 200 mL/hr over 30 Minutes Intravenous  Once 03/28/2021 0517 03/28/2021 0549   03/28/2021 0530  vancomycin (VANCOREADY) IVPB 1250 mg/250 mL        1,250 mg 166.7 mL/hr over 90 Minutes Intravenous  Once 03/22/2021 0518 04/04/2021 0728        Objective: Vitals:   03/31/21 0500 03/31/21 0600 03/31/21 0655 03/31/21 0700  BP: (!) 161/65 (!) 179/73  (!) 162/68  Pulse: 93 (!) 101  98  Resp: _0 Temp:   98.6 F (37 C)   TempSrc:   Oral   SpO2: 96% 98%  93%  Weight:      Height:        Intake/Output Summary (Last 24 hours) at 03/31/2021 0719 Last data filed at 03/31/2021 0700 Gross per 24 hour  Intake 1050.14 ml  Output 1100 ml  Net -49.86 ml    Filed Weights   04/15/2021 0323 03/30/21 0600  Weight: 68 kg 79.7 kg    Examination: General exam: Appears comfortable  HEENT: PERRLA, oral mucosa moist, no sclera icterus or thrush-trach in  place Respiratory system: Clear to auscultation. Respiratory effort normal. Cardiovascular system: S1 & S2 heard, regular rate and rhythm Gastrointestinal system: Abdomen soft, non-tender, nondistended. Normal bowel sounds   Central nervous system: Alert -unable to move extremities-can mouth words Extremities: No cyanosis, clubbing or edema Skin: No rashes or ulcers Psychiatry: Appears very depressed today    Data Reviewed: I have personally reviewed following labs and imaging studies  CBC: Recent Labs  Lab 03/21/2021 0443 04/08/2021 0515 04/02/2021 1038 03/30/21 0031 03/31/21 0350  WBC  --  17.0*  --  16.8* 12.1*  NEUTROABS  --  14.8*  --   --   --  HGB 15.6 14.3 13.3 14.4 13.0  HCT 46.0 45.5 39.0 46.5 41.5  MCV  --  82.3  --  83.2 84.5  PLT  --  278  --  303 400    Basic Metabolic Panel: Recent Labs  Lab 04/07/2021 0443 03/30/2021 0515 03/24/2021 0938 03/25/2021 1038 03/30/21 0031 03/31/21 0350  NA 137 136  --  141 139 139  K 3.8 2.8*  --  2.3* 3.5 2.8*  CL 103 100  --   --  106 106  CO2  --  24  --   --  22 17*  GLUCOSE 84 84  --   --  96 129*  BUN 10 7*  --   --  11 15  CREATININE <0.20* 0.32*  --   --  <0.30* 0.53*  CALCIUM  --  9.2  --   --  9.2 9.2  MG  --   --  1.9  --   --   --     GFR: Estimated Creatinine Clearance: 89.6 mL/min (A) (by C-G formula based on SCr of 0.53 mg/dL (L)). Liver Function Tests: Recent Labs  Lab 04/15/2021 0515  AST 16  ALT 14  ALKPHOS 84  BILITOT 1.2  PROT 8.1  ALBUMIN 3.1*    No results for input(s): LIPASE, AMYLASE in the last 168 hours. Recent Labs  Lab 03/28/2021 1530  AMMONIA 11    Coagulation Profile: Recent Labs  Lab 03/24/2021 0515  INR 1.3*    Cardiac Enzymes: No results for input(s): CKTOTAL, CKMB, CKMBINDEX, TROPONINI in the last 168 hours. BNP (last 3 results) No results for input(s): PROBNP in the last 8760 hours. HbA1C: No results for input(s): HGBA1C in the last 72 hours. CBG: Recent Labs  Lab  03/30/21 0604 03/30/21 1150 03/30/21 1551 03/30/21 2147 03/31/21 0656  GLUCAP 73 97 104* 133* 116*    Lipid Profile: No results for input(s): CHOL, HDL, LDLCALC, TRIG, CHOLHDL, LDLDIRECT in the last 72 hours. Thyroid Function Tests: No results for input(s): TSH, T4TOTAL, FREET4, T3FREE, THYROIDAB in the last 72 hours. Anemia Panel: No results for input(s): VITAMINB12, FOLATE, FERRITIN, TIBC, IRON, RETICCTPCT in the last 72 hours. Urine analysis:    Component Value Date/Time   COLORURINE AMBER (A) 04/08/2021 0335   APPEARANCEUR CLOUDY (A) 03/31/2021 0335   LABSPEC 1.009 03/30/2021 0335   PHURINE 7.0 04/07/2021 0335   GLUCOSEU >=500 (A) 03/27/2021 0335   HGBUR MODERATE (A) 04/05/2021 0335   BILIRUBINUR NEGATIVE 03/26/2021 0335   KETONESUR NEGATIVE 03/26/2021 0335   PROTEINUR 100 (A) 04/08/2021 0335   NITRITE NEGATIVE 03/22/2021 0335   LEUKOCYTESUR LARGE (A) 04/04/2021 0335   Sepsis Labs: _0 (procalcitonin:4,lacticidven:4) ) Recent Results (from the past 240 hour(s))  Urine culture     Status: Abnormal   Collection Time: 03/28/2021  3:35 AM   Specimen: In/Out Cath Urine  Result Value Ref Range Status   Specimen Description IN/OUT CATH URINE  Final   Special Requests   Final    NONE Performed at [facility], [place] [address] [place], [place] [address]    Culture MULTIPLE SPECIES PRESENT, SUGGEST RECOLLECTION (A)  Final   Report Status 03/30/2021 FINAL  Final  Resp Panel by RT-PCR (Flu A&B, Covid) Nasopharyngeal Swab     Status: None   Collection Time: 03/24/2021  3:52 AM   Specimen: Nasopharyngeal Swab; Nasopharyngeal(NP) swabs in vial transport medium  Result Value Ref Range Status   SARS Coronavirus 2 by RT PCR NEGATIVE  NEGATIVE Final    Comment: (NOTE) SARS-CoV-2 target nucleic acids are NOT DETECTED.  The SARS-CoV-2 RNA is generally detectable in upper respiratory specimens during the acute phase of infection. The lowest concentration of SARS-CoV-2  viral copies this assay can detect is 138 copies/mL. A negative result does not preclude SARS-Cov-2 infection and should not be used as the sole basis for treatment or other patient management decisions. A negative result may occur with  improper specimen collection/handling, submission of specimen other than nasopharyngeal swab, presence of viral mutation(s) within the areas targeted by this assay, and inadequate number of viral copies(<138 copies/mL). A negative result must be combined with clinical observations, patient history, and epidemiological information. The expected result is Negative.  Fact Sheet for Patients:  EntrepreneurPulse.com.au  Fact Sheet for Healthcare Providers:  IncredibleEmployment.be  This test is no t yet approved or cleared by the [place] FDA and  has been authorized for detection and/or diagnosis of SARS-CoV-2 by FDA under an Emergency Use Authorization (EUA). This EUA will remain  in effect (meaning this test can be used) for the duration of the COVID-19 declaration under Section 564(b)(1) of the Act, 21 U.S.C.section 360bbb-3(b)(1), unless the authorization is terminated  or revoked sooner.       Influenza A by PCR NEGATIVE NEGATIVE Final   Influenza B by PCR NEGATIVE NEGATIVE Final    Comment: (NOTE) The Xpert Xpress SARS-CoV-2/FLU/RSV plus assay is intended as an aid in the diagnosis of influenza from Nasopharyngeal swab specimens and should not be used as a sole basis for treatment. Nasal washings and aspirates are unacceptable for Xpert Xpress SARS-CoV-2/FLU/RSV testing.  Fact Sheet for Patients: EntrepreneurPulse.com.au  Fact Sheet for Healthcare Providers: IncredibleEmployment.be  This test is not yet approved or cleared by the [place] FDA and has been authorized for detection and/or diagnosis of SARS-CoV-2 by FDA under an Emergency Use Authorization  (EUA). This EUA will remain in effect (meaning this test can be used) for the duration of the COVID-19 declaration under Section 564(b)(1) of the Act, 21 U.S.C. section 360bbb-3(b)(1), unless the authorization is terminated or revoked.  Performed at [facility], [place] [address], [place], [place] [address]   Blood Culture (routine x 2)     Status: None (Preliminary result)   Collection Time: 04/17/2021  5:08 AM   Specimen: BLOOD  Result Value Ref Range Status   Specimen Description   Final    BLOOD LEFT ANTECUBITAL Performed at [facility], [address] [place], [place] [address]    Special Requests   Final    BOTTLES DRAWN AEROBIC AND ANAEROBIC Blood Culture adequate volume Performed at [facility], [address] [place], [place] [address]    Culture  Setup Time   Final    GRAM POSITIVE COCCI IN CLUSTERS IN BOTH AEROBIC AND ANAEROBIC BOTTLES CRITICAL RESULT CALLED TO, READ BACK BY AND VERIFIED WITH: Zoila Shutter [id number] 03/30/2021 Mena Goes Performed at [facility], [place] [address] [place], [place] [address]    Culture GRAM POSITIVE COCCI  Final   Report Status PENDING  Incomplete  Blood Culture ID Panel (Reflexed)     Status: Abnormal   Collection Time: 04/17/2021  5:08 AM  Result Value Ref Range Status   Enterococcus faecalis NOT DETECTED NOT DETECTED Final   Enterococcus Faecium NOT DETECTED NOT DETECTED Final   Listeria monocytogenes NOT DETECTED NOT DETECTED Final   Staphylococcus species DETECTED (A) NOT DETECTED  Final    Comment: CRITICAL RESULT CALLED TO, READ BACK BY AND VERIFIED WITH: V. Beecher Mcardle [id number] 03/30/2021 T. TYSOR    Staphylococcus aureus (BCID) NOT DETECTED NOT DETECTED Final   Staphylococcus epidermidis NOT DETECTED NOT DETECTED Final   Staphylococcus lugdunensis NOT DETECTED NOT DETECTED Final   Streptococcus species NOT DETECTED NOT DETECTED Final   Streptococcus agalactiae NOT DETECTED NOT DETECTED Final    Streptococcus pneumoniae NOT DETECTED NOT DETECTED Final   Streptococcus pyogenes NOT DETECTED NOT DETECTED Final   A.calcoaceticus-baumannii NOT DETECTED NOT DETECTED Final   Bacteroides fragilis NOT DETECTED NOT DETECTED Final   Enterobacterales NOT DETECTED NOT DETECTED Final   Enterobacter cloacae complex NOT DETECTED NOT DETECTED Final   Escherichia coli NOT DETECTED NOT DETECTED Final   Klebsiella aerogenes NOT DETECTED NOT DETECTED Final   Klebsiella oxytoca NOT DETECTED NOT DETECTED Final   Klebsiella pneumoniae NOT DETECTED NOT DETECTED Final   Proteus species NOT DETECTED NOT DETECTED Final   Salmonella species NOT DETECTED NOT DETECTED Final   Serratia marcescens NOT DETECTED NOT DETECTED Final   Haemophilus influenzae NOT DETECTED NOT DETECTED Final   Neisseria meningitidis NOT DETECTED NOT DETECTED Final   Pseudomonas aeruginosa NOT DETECTED NOT DETECTED Final   Stenotrophomonas maltophilia NOT DETECTED NOT DETECTED Final   Candida albicans NOT DETECTED NOT DETECTED Final   Candida auris NOT DETECTED NOT DETECTED Final   Candida glabrata NOT DETECTED NOT DETECTED Final   Candida krusei NOT DETECTED NOT DETECTED Final   Candida parapsilosis NOT DETECTED NOT DETECTED Final   Candida tropicalis NOT DETECTED NOT DETECTED Final   Cryptococcus neoformans/gattii NOT DETECTED NOT DETECTED Final    Comment: Performed at [facility], [address], [place], [place] [address]  Expectorated Sputum Assessment w Gram Stain, Rflx to Resp Cult     Status: None   Collection Time: 03/27/2021  8:31 AM   Specimen: Expectorated Sputum  Result Value Ref Range Status   Specimen Description EXPECTORATED SPUTUM  Final   Special Requests NONE  Final   Sputum evaluation   Final    THIS SPECIMEN IS ACCEPTABLE FOR SPUTUM CULTURE Performed at [facility], [place] [address] [place], [place] [address]    Report Status 04/07/2021 FINAL  Final  MRSA Next Gen by PCR, Nasal     Status: None    Collection Time: 03/28/2021  8:31 AM  Result Value Ref Range Status   MRSA by PCR Next Gen NOT DETECTED NOT DETECTED Final    Comment: (NOTE) The GeneXpert MRSA Assay (FDA approved for NASAL specimens only), is one component of a comprehensive MRSA colonization surveillance program. It is not intended to diagnose MRSA infection nor to guide or monitor treatment for MRSA infections. Test performance is not FDA approved in patients less than 51 years old. Performed at [facility], [place] [address] [place], [place] [address]   Culture, Respiratory w Gram Stain     Status: None (Preliminary result)   Collection Time: 04/01/2021  8:31 AM  Result Value Ref Range Status   Specimen Description EXPECTORATED SPUTUM  Final   Special Requests NONE Reflexed from [id number]  Final   Gram Stain   Final    ABUNDANT WBC PRESENT, PREDOMINANTLY PMN RARE GRAM POSITIVE COCCI    Culture   Final    RARE STAPHYLOCOCCUS AUREUS RARE PROTEUS MIRABILIS CULTURE REINCUBATED FOR BETTER GROWTH Performed at [facility], [place] [address] [place], [place] [address]  Report Status PENDING  Incomplete  Blood Culture (routine x 2)     Status: None (Preliminary result)   Collection Time: 03/25/2021  9:38 AM   Specimen: BLOOD RIGHT HAND  Result Value Ref Range Status   Specimen Description BLOOD RIGHT HAND  Final   Special Requests   Final    BOTTLES DRAWN AEROBIC AND ANAEROBIC Blood Culture adequate volume   Culture   Final    NO GROWTH < 24 HOURS Performed at [facility], [place] [address] [place], [place] [address]    Report Status PENDING  Incomplete         Radiology Studies: CT CHEST WO CONTRAST  Result Date: 03/21/2021 CLINICAL DATA:  Pneumonia with effusion or abscess suspected. New hypoxia. EXAM: CT CHEST WITHOUT CONTRAST TECHNIQUE: Multidetector CT imaging of the chest was performed following the standard protocol without IV contrast. COMPARISON:  Radiograph from earlier today.  Chest  CT 07/27/2018 FINDINGS: Cardiovascular: Normal heart size. No pericardial effusion. Mild atheromatous calcification of the aorta and coronaries. Mediastinum/Nodes: Generous sized lymph nodes which may be reactive in this setting. Chronic calcified mediastinal nodes from remote granulomatous disease. Lungs/Pleura: Tracheostomy tube in place. Central airway debris at both mainstem bronchi with layering. Extensive consolidative opacity with some volume loss on the left lung where there is a small parapneumonic effusion. No cavitary changes. More atelectatic type opacity at the right base. No edema. No air leak. Upper Abdomen: Negative Musculoskeletal: No acute finding IMPRESSION: 1. Extensive pneumonia on the left. Recommend follow-up to clearing. 2. Extensive airway debris narrowing bilateral mainstem bronchi. 3. Small pleural effusions. Electronically Signed   By: Monte Fantasia M.D.   On: 04/10/2021 11:14      Scheduled Meds:  atorvastatin  20 mg Oral QPM   brimonidine  1 drop Both Eyes TID   carvedilol  6.25 mg Oral BID WC   chlorhexidine gluconate (MEDLINE KIT)  15 mL Mouth Rinse BID   Chlorhexidine Gluconate Cloth  6 each Topical Daily   dorzolamide-timolol  1 drop Both Eyes TID   enoxaparin (LOVENOX) injection  40 mg Subcutaneous Q24H   famotidine  20 mg Oral BID   folic acid  1 mg Oral Daily   furosemide  20 mg Oral Daily   insulin aspart  0-5 Units Subcutaneous QHS   insulin aspart  0-9 Units Subcutaneous TID WC   ipratropium-albuterol  3 mL Nebulization TID   lactulose  20 g Oral Daily   latanoprost  1 drop Both Eyes QHS   losartan  25 mg Oral Daily   mouth rinse  15 mL Mouth Rinse 10 times per day   PARoxetine  20 mg Oral q morning   potassium chloride  20 mEq Oral BID   potassium chloride  40 mEq Oral Once   sodium chloride HYPERTONIC  4 mL Nebulization BID   Continuous Infusions:  ceFEPime (MAXIPIME) IV Stopped (03/31/21 0615)   potassium chloride 100 mL/hr at 03/31/21 0700      LOS: 2 days      Debbe Odea, MD [facility] Pager: [email or web address] 03/31/2021, 7:19 AM

## 2021-03-31 NOTE — Progress Notes (Addendum)
NAME:  Wayne Young, MRN:  [id number], DOB:  1955/10/22, LOS: 2 ADMISSION DATE:  April 23, 2021, CONSULTATION DATE: 04-23-2021 REFERRING MD:  Dr. Jacqulyn Bath, CHIEF COMPLAINT: Hypoxic respiratory failure  History of Present Illness:  HPI obtained from medical chart review as patient is mechanically ventilated via trach.  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  Patient has home health nurse who noted increased work of breathing and hypoxemia which began overnight.  Has been having increased secretions since Wednesday.  She had been suctioning them Was suctioned without secretions and given breathing treatment with minimal increase in O2.   In fact after the most recent suctioning home nurse noted that his oxygen saturations went down significantly and thus called EMS.  On EMS arrival, O2 sats in the 80s which increased to the 90s on 6 L from usually 5 L bled it.  Home health nurse also reported improving lower leg edema.  This is been improving over the last few months  In ER, noted to be febrile at 101, minimally tachycardic at 106 and normotensive.  Labs significant for WBC 17, K 2.8, low BUN 7 and sCr 0.32, albumin 3.1, normal lactate, INR 1.3, SARS/ flu neg, UA cloudy with large leukocytes and pyuria.  CXR noted for confluent masslike opacities in the left mid lung, most suspicious for pneumonia, but underlying mass not excluded.  Blood and urine cultures sent and empirically started on vancomycin and cefepime.  TRH to admit, PCCM consulted for chronic vent management.   Pertinent  Medical History  ALS w/trach on home vent, HTN, DM, arthritis, glaucoma  Significant Hospital Events: Including procedures, antibiotic start and stop dates in addition to other pertinent events   Admit to [facility] with sepsis, pneumonia/ UTI  Bcx2 6/11 >> GPC's in clusters  in 1/2 sets >>  Ucx 6/11 >> recollection suggested, multiple species  Tracheal aspirate 6/11 >> proteus mirabilis, rare staph >>  Vanc 6/11 >> 6/11 Cefepime 6/11 >>  Interim History / Subjective:  Remains on vent - 40%, PEEP 5  > settings stable  I/O 1.1L UOP, net even in last 24 hours Afebrile  Pt denies pain, SOB  No acute events per RN - just finished chest PT via bed   Objective   Blood pressure (!) 162/68, pulse 98, temperature 98.6 F (37 C), temperature source Oral, resp. rate 16, height 5\' 9"  (1.753 m), weight 79.7 kg, SpO2 93 %.    Vent Mode: PRVC FiO2 (%):  [40 %] 40 % Set Rate:  [16 bmp] 16 bmp Vt Set:  [560 mL] 560 mL PEEP:  [5 cmH20] 5 cmH20 Plateau Pressure:  [19 cmH20-23 cmH20] 20 cmH20   Intake/Output Summary (Last 24 hours) at 03/31/2021 04/02/2021 Last data filed at 03/31/2021 0700 Gross per 24 hour  Intake 1050.14 ml  Output 1100 ml  Net -49.86 ml   Filed Weights   04-23-2021 0323 03/30/21 0600  Weight: 68 kg 79.7 kg    Examination: General: chronically ill appearing adult male lying in bed in NAD  HEENT: MM pink/moist, trach midline c/d/I, anicteric Neuro: Awakens to voice, nods yes/no, extremities x4 with changes consistent with chronic neuromuscular disease CV: s1s2 RRR, no m/r/g PULM: non-labored on vent, rhonchi on left, clear on right GI: soft, bsx4 active  Extremities: warm/dry, trace to 1+ dependent edema  Skin: no rashes or lesions on exposed skin  Labs/imaging that I havepersonally reviewed  (right click and "Reselect all SmartList Selections" daily)  PCXR / ABG from 6/11 CBC  BMP - K 2.8, CO2 17  Resolved Hospital Problem list    Assessment & Plan:   Acute on chronic hypoxic respiratory failure 2/2 ALS & Left PNA  Sepsis  On home mini vent with [employer] (Trilogy label on bottom).  Using AVAPS mode.  PEEP 6, respiratory rate 20, PIP 20 exhaled tidal volume with this around 300-minute ventilation around 5.    -continue  current vent settings PRVC 8cc/kg 560 Vt, R16, PEEP 5, FiO2 40% -will investigate AVAPS mode at home and home vent limitations, ? If he will need a Trilogy vent  -VAP prevention measures -L airspace disease somewhat rounded on CXR, will need outpatient CT post treatment of PNA to ensure clearance and no underlying mass  -follow cultures -continue cefepime, D3/x. Consider 7 days therapy total  -add vanco, await cultures before narrowing -would transition to home vent for 24 -48 hours prior to discharge and assess ABG to ensure adequate settings -H2 blocker while on vent -pulmonary hygiene -chest PT  -continue Duoneb TID, xopenex Q6  -trach care per protocol  -hypertonic saline neb BID > stop date added for 48 more hours -diet per SLP recommendations > note last testing with aspiration but was given a diet to aspirate "less".  He is not eating with the new diet.  Consider TF or palliative and let him eat as he desires.  -consider Palliative Care consultation   Remainder per primary team.  PCCM will follow up 6/15    Best practice (right click and "Reselect all SmartList Selections" daily)  Diet:  Oral, per SLP recommendations Pain/Anxiety/Delirium protocol (if indicated): No VAP protocol (if indicated): Yes DVT prophylaxis: LMWH GI prophylaxis: H2B Glucose control:  SSI No Central venous access:  N/A Arterial line:  N/A Foley:  N/A Mobility:  bed rest  PT consulted: N/A Last date of multidisciplinary goals of care discussion [per primary] Code Status:  full code Disposition: per TRH   Critical care time: n/a    Canary Brim, MSN, APRN, NP-C, AGACNP-BC [facility] 03/31/2021, 8:42 AM   Please see [email or web address] for pager details.   From 7A-7P if no response, please call [phone] After hours, please call ELink [phone]

## 2021-04-01 ENCOUNTER — Inpatient Hospital Stay (HOSPITAL_COMMUNITY): Payer: Medicare HMO

## 2021-04-01 DIAGNOSIS — E785 Hyperlipidemia, unspecified: Secondary | ICD-10-CM | POA: Diagnosis not present

## 2021-04-01 DIAGNOSIS — Z515 Encounter for palliative care: Secondary | ICD-10-CM

## 2021-04-01 DIAGNOSIS — J9621 Acute and chronic respiratory failure with hypoxia: Secondary | ICD-10-CM | POA: Diagnosis not present

## 2021-04-01 DIAGNOSIS — G1221 Amyotrophic lateral sclerosis: Secondary | ICD-10-CM | POA: Diagnosis not present

## 2021-04-01 DIAGNOSIS — J189 Pneumonia, unspecified organism: Secondary | ICD-10-CM | POA: Diagnosis not present

## 2021-04-01 DIAGNOSIS — I10 Essential (primary) hypertension: Secondary | ICD-10-CM | POA: Diagnosis not present

## 2021-04-01 LAB — CBC
HCT: 43.7 % (ref 39.0–52.0)
Hemoglobin: 13.3 g/dL (ref 13.0–17.0)
MCH: 26.1 pg (ref 26.0–34.0)
MCHC: 30.4 g/dL (ref 30.0–36.0)
MCV: 85.9 fL (ref 80.0–100.0)
Platelets: 279 10*3/uL (ref 150–400)
RBC: 5.09 MIL/uL (ref 4.22–5.81)
RDW: 17.5 % — ABNORMAL HIGH (ref 11.5–15.5)
WBC: 12.3 10*3/uL — ABNORMAL HIGH (ref 4.0–10.5)
nRBC: 0 % (ref 0.0–0.2)

## 2021-04-01 LAB — CULTURE, RESPIRATORY W GRAM STAIN

## 2021-04-01 LAB — BASIC METABOLIC PANEL
Anion gap: 14 (ref 5–15)
Anion gap: 17 — ABNORMAL HIGH (ref 5–15)
BUN: 11 mg/dL (ref 8–23)
BUN: 9 mg/dL (ref 8–23)
CO2: 16 mmol/L — ABNORMAL LOW (ref 22–32)
CO2: 16 mmol/L — ABNORMAL LOW (ref 22–32)
Calcium: 9.4 mg/dL (ref 8.9–10.3)
Calcium: 10 mg/dL (ref 8.9–10.3)
Chloride: 109 mmol/L (ref 98–111)
Chloride: 112 mmol/L — ABNORMAL HIGH (ref 98–111)
Creatinine, Ser: 0.55 mg/dL — ABNORMAL LOW (ref 0.61–1.24)
Creatinine, Ser: 0.57 mg/dL — ABNORMAL LOW (ref 0.61–1.24)
GFR, Estimated: >60 mL/min (ref >60)
GFR, Estimated: >60 mL/min (ref >60)
Glucose, Bld: 128 mg/dL — ABNORMAL HIGH (ref 70–99)
Glucose, Bld: 127 mg/dL — ABNORMAL HIGH (ref 70–99)
Potassium: 3.4 mmol/L — ABNORMAL LOW (ref 3.5–5.1)
Potassium: 4.1 mmol/L (ref 3.5–5.1)
Sodium: 139 mmol/L (ref 135–145)
Sodium: 145 mmol/L (ref 135–145)

## 2021-04-01 LAB — GLUCOSE, CAPILLARY
Glucose-Capillary: 116 mg/dL — ABNORMAL HIGH (ref 70–99)
Glucose-Capillary: 119 mg/dL — ABNORMAL HIGH (ref 70–99)
Glucose-Capillary: 125 mg/dL — ABNORMAL HIGH (ref 70–99)
Glucose-Capillary: 150 mg/dL — ABNORMAL HIGH (ref 70–99)

## 2021-04-01 LAB — CULTURE, BLOOD (ROUTINE X 2): Special Requests: ADEQUATE

## 2021-04-01 LAB — COMPREHENSIVE METABOLIC PANEL
ALT: 11 U/L (ref 0–44)
AST: 9 U/L — ABNORMAL LOW (ref 15–41)
Albumin: 2.5 g/dL — ABNORMAL LOW (ref 3.5–5.0)
Alkaline Phosphatase: 79 U/L (ref 38–126)
Anion gap: 16 — ABNORMAL HIGH (ref 5–15)
BUN: 10 mg/dL (ref 8–23)
CO2: 14 mmol/L — ABNORMAL LOW (ref 22–32)
Calcium: 9.6 mg/dL (ref 8.9–10.3)
Chloride: 112 mmol/L — ABNORMAL HIGH (ref 98–111)
Creatinine, Ser: 0.58 mg/dL — ABNORMAL LOW (ref 0.61–1.24)
GFR, Estimated: >60 mL/min (ref >60)
Glucose, Bld: 134 mg/dL — ABNORMAL HIGH (ref 70–99)
Potassium: 4 mmol/L (ref 3.5–5.1)
Sodium: 142 mmol/L (ref 135–145)
Total Bilirubin: 2.2 mg/dL — ABNORMAL HIGH (ref 0.3–1.2)
Total Protein: 7.6 g/dL (ref 6.5–8.1)

## 2021-04-01 LAB — LACTIC ACID, PLASMA: Lactic Acid, Venous: 0.9 mmol/L (ref 0.5–1.9)

## 2021-04-01 LAB — POCT I-STAT 7, (LYTES, BLD GAS, ICA,H+H)
Acid-base deficit: 10 mmol/L — ABNORMAL HIGH (ref 0.0–2.0)
Bicarbonate: 17.2 mmol/L — ABNORMAL LOW (ref 20.0–28.0)
Calcium, Ion: 1.44 mmol/L — ABNORMAL HIGH (ref 1.15–1.40)
HCT: 41 % (ref 39.0–52.0)
Hemoglobin: 13.9 g/dL (ref 13.0–17.0)
O2 Saturation: 97 %
Patient temperature: 98.7
Potassium: 3.9 mmol/L (ref 3.5–5.1)
Sodium: 147 mmol/L — ABNORMAL HIGH (ref 135–145)
TCO2: 18 mmol/L — ABNORMAL LOW (ref 22–32)
pCO2 arterial: 41.8 mmHg (ref 32.0–48.0)
pH, Arterial: 7.223 — ABNORMAL LOW (ref 7.350–7.450)
pO2, Arterial: 102 mmHg (ref 83.0–108.0)

## 2021-04-01 MED ORDER — POTASSIUM CHLORIDE 10 MEQ/100ML IV SOLN
10.0000 meq | INTRAVENOUS | Status: AC
  Administered 2021-04-01 (×4): 10 meq via INTRAVENOUS
  Filled 2021-04-01 (×4): qty 100

## 2021-04-01 MED ORDER — ENOXAPARIN SODIUM 40 MG/0.4ML IJ SOSY
40.0000 mg | PREFILLED_SYRINGE | INTRAMUSCULAR | Status: DC
  Administered 2021-04-03 – 2021-04-09 (×7): 40 mg via SUBCUTANEOUS
  Filled 2021-04-01 (×8): qty 0.4

## 2021-04-01 MED ORDER — SODIUM CHLORIDE 0.9 % IV SOLN
3.0000 g | Freq: Three times a day (TID) | INTRAVENOUS | Status: DC
Start: 2021-04-01 — End: 2021-04-05
  Administered 2021-04-01 – 2021-04-05 (×12): 3 g via INTRAVENOUS
  Filled 2021-04-01 (×3): qty 8
  Filled 2021-04-01: qty 3
  Filled 2021-04-01: qty 8
  Filled 2021-04-01 (×3): qty 3
  Filled 2021-04-01: qty 8
  Filled 2021-04-01: qty 3
  Filled 2021-04-01 (×2): qty 8
  Filled 2021-04-01: qty 3
  Filled 2021-04-01: qty 8

## 2021-04-01 MED ORDER — POTASSIUM CHLORIDE IN NACL 20-0.9 MEQ/L-% IV SOLN
INTRAVENOUS | Status: DC
  Filled 2021-04-01 (×5): qty 1000

## 2021-04-01 MED ORDER — SODIUM CHLORIDE 0.9 % IV BOLUS
250.0000 mL | Freq: Once | INTRAVENOUS | Status: AC
  Administered 2021-04-01: 250 mL via INTRAVENOUS

## 2021-04-01 MED ORDER — POLYVINYL ALCOHOL 1.4 % OP SOLN
1.0000 [drp] | Freq: Three times a day (TID) | OPHTHALMIC | Status: DC
  Administered 2021-04-01 – 2021-04-11 (×27): 1 [drp] via OPHTHALMIC
  Filled 2021-04-01: qty 15

## 2021-04-01 NOTE — Progress Notes (Signed)
Pt was found to have non-reactive R pupil size 5 while RN was in the room for 1800 mouthcare. Pupils were equal and L was reactive. Paged Dr. Butler Denmark and she ordered ABG stat. Called Respiratory and awaiting respiratory to obtain ABG.   Talmage Coin RN

## 2021-04-01 NOTE — Consult Note (Signed)
Chief Complaint: Patient was seen in consultation today for percutaneous gastric tube placement Chief Complaint  Patient presents with   Respiratory Distress   at the request of Dr Karlyne Greenspan   Supervising Physician: Richarda Overlie  Patient Status: [facility] - In-pt  History of Present Illness: Wayne Young is a 65 y.o. 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  Medications: Prior to Admission medications   Medication Sig Start Date End Date Taking? Authorizing Provider  acetaminophen (TYLENOL) 325 MG tablet Place 2 tablets (650 mg total) into feeding tube every 6 (six) hours as needed for mild pain, fever or headache. 08/20/18  Yes Tobey Grim, NP  albuterol (PROVENTIL) (2.5 MG/3ML) 0.083% nebulizer solution Take 3 mLs (2.5 mg total) by nebulization every 2 (two) hours as needed for wheezing or shortness of breath. Patient taking differently: Take 2.5 mg by nebulization 2 (two) times daily as needed for wheezing or shortness of breath. 08/20/18  Yes Tobey Grim, NP  atorvastatin (LIPITOR) 20 MG tablet Take 20 mg by mouth every evening. 08/16/20  Yes [provider]  Carboxymethylcellulose Sodium (REFRESH TEARS OP) Place 1 drop into both eyes as needed (comfort).   Yes [provider]  carvedilol (COREG) 6.25 MG tablet Take 6.25 mg by mouth 2 (two) times daily with a meal.   Yes [provider]  DROPLET INSULIN SYRINGE 31G X 5/16" 1 ML MISC   09/05/20  Yes [provider]  empagliflozin (JARDIANCE) 10 MG TABS tablet Take 10 mg by mouth daily.   Yes [provider]  famotidine (PEPCID) 40 MG/5ML suspension Place 2.5 mLs (20 mg total) into feeding tube 2 (two) times daily. Patient taking differently: Take 20 mg by mouth 2 (two) times daily. 08/21/18  Yes Tobey Grim, NP  folic acid (FOLVITE) 1 MG tablet Take 1 tablet by mouth daily. 10/31/20  Yes [provider]  furosemide (LASIX) 20 MG tablet Take 20 mg by mouth daily. 11/25/20  Yes [provider]  glipiZIDE (GLUCOTROL) 10 MG tablet Take 10 mg by mouth daily before breakfast.   Yes [provider]  guaifenesin (HUMIBID E) 400 MG TABS tablet Take 400 mg by mouth 2 (two) times daily as needed (heavy,copious or thick secretions).   Yes [provider]  hydrOXYzine (ATARAX/VISTARIL) 25 MG tablet Take 25 mg by mouth at bedtime as needed for anxiety or vomiting. 04/29/20  Yes [provider]  insulin aspart (NOVOLOG) 100 UNIT/ML injection Inject 2-10 Units into the skin See admin instructions. Qid per sliding scale 150-199= 2 units 200-249= 4 units 250-299 = 6 units 300-349= 8 units >350 = 10 units   Yes [provider]  insulin regular (NOVOLIN R) 100 units/mL injection Inject 10 Units into the skin See admin instructions. 10 units tid with meals(hold if bs <150) Qid with meals and at bedtime with base dose.   Yes [provider]  lactulose (CHRONULAC) 10 GM/15ML solution Take 20  g by mouth daily. 30 ml   Yes [provider]  latanoprost (XALATAN) 0.005 % ophthalmic solution Place 1 drop into both eyes at bedtime.   Yes [provider]  LORazepam (ATIVAN) 0.5 MG tablet Take 0.5 mg by mouth every 6 (six) hours as needed for anxiety (panic attack).   Yes [provider]  losartan (COZAAR) 25 MG tablet Take 25 mg by mouth daily.   Yes [provider]  methotrexate  (RHEUMATREX) 2.5 MG tablet Take 17.5 mg by mouth every Thursday. 01/06/21  Yes [provider]  nystatin cream (MYCOSTATIN) Apply 1 application topically 3 (three) times daily. To fungal rash until clear   Yes [provider]  pantoprazole (PROTONIX) 20 MG tablet Take 20 mg by mouth 2 (two) times daily. 09/03/20  Yes [provider]  PARoxetine (PAXIL) 20 MG tablet Take 1 tablet by mouth every morning. 11/06/20  Yes [provider]  potassium chloride 20 MEQ/15ML (10%) SOLN Take 20 mEq by mouth 2 (two) times daily. Dilute in 1/2 glass of water/juice 11/15/20  Yes [provider]  simethicone (MYLICON) 125 MG chewable tablet Chew 125 mg by mouth See admin instructions. Before meals and at bedtime x 10 days   Yes [provider]  ACCU-CHEK AVIVA PLUS test strip  09/05/20   [provider]  sodium chloride flush (NS) 0.9 % SOLN 10-40 mLs by Intracatheter route every 12 (twelve) hours. Patient not taking: No sig reported 08/21/18   Tobey Grim, NP  amLODipine (NORVASC) 10 MG tablet Place 1 tablet (10 mg total) into feeding tube daily. Patient not taking: Reported on 04/13/2021 08/21/18 03/22/2021  Tobey Grim, NP  enoxaparin (LOVENOX) 40 MG/0.4ML injection Inject 0.4 mLs (40 mg total) into the skin daily. Patient not taking: Reported on 04/05/2021 08/21/18 03/28/2021  Tobey Grim, NP  insulin glargine (LANTUS) 100 UNIT/ML injection Inject 0.1 mLs (10 Units total) into the skin daily. Patient not taking: Reported on 03/19/2021 08/21/18 04/14/2021  Tobey Grim, NP  labetalol (NORMODYNE,TRANDATE) 5 MG/ML injection Inject 2 mLs (10 mg total) into the vein every 6 (six) hours as needed (systolic greater than 150). Patient not taking: Reported on 04/05/2021 08/20/18 04/10/2021  Tobey Grim, NP  levalbuterol Pauline Aus) 1.25 MG/0.5ML nebulizer solution Take 1.25 mg by nebulization every 6 (six) hours. Patient not taking: Reported on  03/23/2021 08/21/18 04/10/2021  Tobey Grim, NP  levETIRAcetam (KEPRRA) 500 MG/100ML SOLN Inject 100 mLs (500 mg total) into the vein every 12 (twelve) hours. Patient not taking: Reported on 04/01/2021 08/21/18 03/28/2021  Tobey Grim, NP     Family History  Problem Relation Age of Onset   Asthma Mother     Social History   Socioeconomic History   Marital status: Single    Spouse name: Not on file   Number of children: Not on file   Years of education: Not on file   Highest education level: Not on file  Occupational History   Not on file  Tobacco Use   Smoking status: Former    Pack years: 0.00   Smokeless tobacco: Never   Tobacco comments:    quit about 20 years ago   Vaping Use   Vaping Use: Never used  Substance and Sexual Activity   Alcohol use: Never   Drug use: Never   Sexual activity: Never  Other Topics Concern   Not on file  Social History Narrative   Not on file  Social Determinants of Health   Financial Resource Strain: Not on file  Food Insecurity: Not on file  Transportation Needs: Not on file  Physical Activity: Not on file  Stress: Not on file  Social Connections: Not on file    Review of Systems: A 12 point ROS discussed and pertinent positives are indicated in the HPI above.  All other systems are negative.    Vital Signs: BP (!) 186/75   Pulse (!) 112   Temp 98.3 F (36.8 C) (Oral)   Resp 16   Ht  (1.753 m)   Wt 175 lb 11.3 oz (79.7 kg)   SpO2 97%   BMI 25.95 kg/m   Physical Exam Vitals reviewed.  Constitutional:      Comments: Trach Ill appearing   Cardiovascular:     Rate and Rhythm: Normal rate and regular rhythm.  Pulmonary:     Comments: vent Musculoskeletal:     Comments: No movement No response  Skin:    General: Skin is warm.    Imaging: CT CHEST WO CONTRAST  Result Date: 03/22/2021 CLINICAL DATA:  Pneumonia with effusion or abscess suspected. New hypoxia. EXAM: CT CHEST WITHOUT CONTRAST  TECHNIQUE: Multidetector CT imaging of the chest was performed following the standard protocol without IV contrast. COMPARISON:  Radiograph from earlier today.  Chest CT 07/27/2018 FINDINGS: Cardiovascular: Normal heart size. No pericardial effusion. Mild atheromatous calcification of the aorta and coronaries. Mediastinum/Nodes: Generous sized lymph nodes which may be reactive in this setting. Chronic calcified mediastinal nodes from remote granulomatous disease. Lungs/Pleura: Tracheostomy tube in place. Central airway debris at both mainstem bronchi with layering. Extensive consolidative opacity with some volume loss on the left lung where there is a small parapneumonic effusion. No cavitary changes. More atelectatic type opacity at the right base. No edema. No air leak. Upper Abdomen: Negative Musculoskeletal: No acute finding IMPRESSION: 1. Extensive pneumonia on the left. Recommend follow-up to clearing. 2. Extensive airway debris narrowing bilateral mainstem bronchi. 3. Small pleural effusions. Electronically Signed   By: Marnee Spring M.D.   On: 04/06/2021 11:14   DG Chest Port 1 View  Result Date: 03/19/2021 CLINICAL DATA:  Concern for sepsis EXAM: PORTABLE CHEST 1 VIEW COMPARISON:  Radiograph 08/20/2018, CT 07/27/2018 FINDINGS: Confluent masslike opacities present in the left mid lung and retrocardiac space. No pneumothorax or visible effusion. Asymmetrically diminished left lung volumes are similar to the recent comparison radiography albeit with a more normal appearance on the somewhat more remote CT comparison. Tracheostomy tube in place. Cardiomediastinal contours are stable from prior with a calcified aorta. Degenerative changes are present in the imaged spine and shoulders. No acute osseous or soft tissue abnormality. IMPRESSION: Confluent masslike opacities present in the left mid lung. While this may be infectious/pneumonia in the setting of suspected sepsis, underlying mass is not fully  excluded. Could consider short-term (6 week) repeat imaging following therapeutic intervention. Otherwise, consider CT imaging for further characterization. Electronically Signed   By: Kreg Shropshire M.D.   On: 03/19/2021 04:24    Labs:  CBC: Recent Labs    04/02/2021 0515 04/03/2021 1038 03/30/21 0031 03/31/21 0350  WBC 17.0*  --  16.8* 12.1*  HGB 14.3 13.3 14.4 13.0  HCT 45.5 39.0 46.5 41.5  PLT 278  --  303 255    COAGS: Recent Labs    04/01/2021 0515  INR 1.3*  APTT 23*    BMP: Recent Labs    04/15/2021 0515 04/07/2021 1038 03/30/21 0031 03/31/21  0350 04/01/21 0114  NA 136 141 139 139 139  K 2.8* 2.3* 3.5 2.8* 3.4*  CL 100  --  106 106 109  CO2 24  --  22 17* 16*  GLUCOSE 84  --  96 129* 128*  BUN 7*  --  11 15 11   CALCIUM 9.2  --  9.2 9.2 9.4  CREATININE 0.32*  --  <0.30* 0.53* 0.55*  GFRNONAA >60  --  NOT CALCULATED >60 >60    LIVER FUNCTION TESTS: Recent Labs    04/03/2021 0515  BILITOT 1.2  AST 16  ALT 14  ALKPHOS 84  PROT 8.1  ALBUMIN 3.1*    TUMOR MARKERS: No results for input(s): AFPTM, CEA, CA199, CHROMGRNA in the last 8760 hours.  Assessment and Plan:  ALS Dysphagia Aspiration PNA Need for long term care Scheduled for percutaneous gastric tube placement in IR  Risks and benefits image guided gastrostomy tube placement was discussed with the patient's wife [name] via phone including, but not limited to the need for a barium enema during the procedure, bleeding, infection, peritonitis and/or damage to adjacent structures.  All questions were answered,[name] is agreeable to proceed. Consent signed and in chart.   Thank you for this interesting consult.  I greatly enjoyed meeting Jeremiyah Cullens and look forward to participating in their care.  A copy of this report was sent to the requesting provider on this date.  Electronically Signed: Lillia Abed, PA-C 04/01/2021, 12:59 PM   I spent a total of 40 Minutes    in face to face in clinical  consultation, greater than 50% of which was counseling/coordinating care for percutaneous gastric tube placement

## 2021-04-01 NOTE — Progress Notes (Signed)
K 3.4 Electrolytes replaced per eLINK electrolyte replacement protocol 

## 2021-04-01 NOTE — Consult Note (Signed)
Consultation Note Date: 04/01/2021   Patient Name: Wayne Young  DOB: February 11, 1956  MRN: [id number]  Age / Sex: 65 y.o., male  PCP: Cipriano Mile, NP Referring Physician: Debbe Odea, MD  Reason for Consultation:  discuss feeding tube  HPI/Patient Profile: 65 y.o. male  with past medical history of ALS, wheelchair and vent dependent, HTN, DM2, RA, glaucoma, blind in R eye admitted on 04/07/2021 with pneumonia- likely aspiration due to worsening dysphagia from ALS. Palliative consulted to assist with discussion re: PEG tube vs hospice.    Clinical Assessment and Goals of Care: Patient in bed. He is awake, although he appears lethargic. He nods appropriately to most yes/no questions.  Spouse [name] is at bedside. She provides most of the care for "[name]" at home. She is very knowledgeable regarding his ventilator and other care. He does have health care workers at home as well. Typically, he is able to communicate using a passey-muir valve with his vent. They have two children who are in their 38's.  [name] finds joy in watching all sports. The [employer] are his favorite.  We discussed ALS and its cruel trajectory.  Goals of care were discussed including feeding tube discussion.  [name] shares that they had been advised to go ahead and get PEG tube in advance of his needing one. [name] had stated before that he didn't want a PEG yet if he didn't need it yet.  She said that she has asked him several times today and this previous week if he wanted to have one placed and he now says he does. We discussed the possibility of continued aspiration and decline even with a PEG.  Options for continued life sustaining care vs comfort were discussed. CODE STATUS was discussed.  Family would like to proceed with all medical treatment and PEG tube. However, if patient experiences cardiac arrest- then he would not want to be  resuscitated.       Primary Decision Maker NEXT OF KIN - spouse- [name]    SUMMARY OF RECOMMENDATIONS   -Scheduled for PEG tomorrow -Continue full scope care -DNR -Will schedule eye drops- he has significant dry eyes with tearing -He has been previously referred to outpatient Palliative - he has outpatient Palliative consult scheduled for 04/21/2021 in his home -PMT will chart check for progress and see if patient declines- please call if need to be seen sooner  Code Status/Advance Care Planning: DNR  Prognosis:   Unable to determine  Discharge Planning: Home with Palliative Services  Primary Diagnoses: Present on Admission:  CAP (community acquired pneumonia)  Hypokalemia  Rheumatoid arthritis with positive rheumatoid factor ([place])  Hypertension  Hyperlipidemia  Hypoalbuminemia  History of TB (tuberculosis)  Acute on chronic respiratory failure with hypoxemia (HCC)  Acute on chronic respiratory failure with hypoxia ([place])  Protein-calorie malnutrition, severe  ALS (amyotrophic lateral sclerosis) ([place])   I have reviewed the medical record, interviewed the patient and family, and examined the patient. The following aspects are pertinent.  Past Medical History:  Diagnosis Date  Arthritis    Diabetes mellitus without complication (HCC)    Glaucoma    Hypertension    Social History   Socioeconomic History   Marital status: Single    Spouse name: Not on file   Number of children: Not on file   Years of education: Not on file   Highest education level: Not on file  Occupational History   Not on file  Tobacco Use   Smoking status: Former    Pack years: 0.00   Smokeless tobacco: Never   Tobacco comments:    quit about 20 years ago   Vaping Use   Vaping Use: Never used  Substance and Sexual Activity   Alcohol use: Never   Drug use: Never   Sexual activity: Never  Other Topics Concern   Not on file  Social History Narrative   Not on file   Social  Determinants of Health   Financial Resource Strain: Not on file  Food Insecurity: Not on file  Transportation Needs: Not on file  Physical Activity: Not on file  Stress: Not on file  Social Connections: Not on file   Scheduled Meds:  chlorhexidine gluconate (MEDLINE KIT)  15 mL Mouth Rinse BID   Chlorhexidine Gluconate Cloth  6 each Topical Daily   enoxaparin (LOVENOX) injection  40 mg Subcutaneous B70W   folic acid  1 mg Intravenous Daily   ipratropium-albuterol  3 mL Nebulization TID   latanoprost  1 drop Both Eyes QHS   LORazepam  0.5 mg Oral QHS   mouth rinse  15 mL Mouth Rinse 10 times per day   metoprolol tartrate  2.5 mg Intravenous Q8H   PARoxetine  20 mg Oral q morning   senna-docusate  1 tablet Oral QHS   sodium chloride HYPERTONIC  4 mL Nebulization BID   Continuous Infusions:  0.9 % NaCl with KCl 20 mEq / L 125 mL/hr at 04/01/21 1126   ampicillin-sulbactam (UNASYN) IV 3 g (04/01/21 1229)   famotidine (PEPCID) IV 100 mL/hr at 04/01/21 1000   PRN Meds:.acetaminophen, labetalol, polyvinyl alcohol Medications Prior to Admission:  Prior to Admission medications   Medication Sig Start Date End Date Taking? Authorizing Provider  acetaminophen (TYLENOL) 325 MG tablet Place 2 tablets (650 mg total) into feeding tube every 6 (six) hours as needed for mild pain, fever or headache. 08/20/18  Yes Omar Person, NP  albuterol (PROVENTIL) (2.5 MG/3ML) 0.083% nebulizer solution Take 3 mLs (2.5 mg total) by nebulization every 2 (two) hours as needed for wheezing or shortness of breath. Patient taking differently: Take 2.5 mg by nebulization 2 (two) times daily as needed for wheezing or shortness of breath. 08/20/18  Yes Omar Person, NP  atorvastatin (LIPITOR) 20 MG tablet Take 20 mg by mouth every evening. 08/16/20  Yes [provider]  Carboxymethylcellulose Sodium (REFRESH TEARS OP) Place 1 drop into both eyes as needed (comfort).   Yes [provider]   carvedilol (COREG) 6.25 MG tablet Take 6.25 mg by mouth 2 (two) times daily with a meal.   Yes [provider]  DROPLET INSULIN SYRINGE 31G X 5/16" 1 ML MISC  09/05/20  Yes [provider]  empagliflozin (JARDIANCE) 10 MG TABS tablet Take 10 mg by mouth daily.   Yes [provider]  famotidine (PEPCID) 40 MG/5ML suspension Place 2.5 mLs (20 mg total) into feeding tube 2 (two) times daily. Patient taking differently: Take 20 mg by mouth 2 (two) times daily. 08/21/18  Yes Omar Person, NP  folic acid (FOLVITE) 1 MG tablet Take 1 tablet by mouth daily. 10/31/20  Yes [provider]  furosemide (LASIX) 20 MG tablet Take 20 mg by mouth daily. 11/25/20  Yes [provider]  glipiZIDE (GLUCOTROL) 10 MG tablet Take 10 mg by mouth daily before breakfast.   Yes [provider]  guaifenesin (HUMIBID E) 400 MG TABS tablet Take 400 mg by mouth 2 (two) times daily as needed (heavy,copious or thick secretions).   Yes [provider]  hydrOXYzine (ATARAX/VISTARIL) 25 MG tablet Take 25 mg by mouth at bedtime as needed for anxiety or vomiting. 04/29/20  Yes [provider]  insulin aspart (NOVOLOG) 100 UNIT/ML injection Inject 2-10 Units into the skin See admin instructions. Qid per sliding scale 150-199= 2 units 200-249= 4 units 250-299 = 6 units 300-349= 8 units >350 = 10 units   Yes [provider]  insulin regular (NOVOLIN R) 100 units/mL injection Inject 10 Units into the skin See admin instructions. 10 units tid with meals(hold if bs <150) Qid with meals and at bedtime with base dose.   Yes [provider]  lactulose (CHRONULAC) 10 GM/15ML solution Take 20 g by mouth daily. 30 ml   Yes [provider]  latanoprost (XALATAN) 0.005 % ophthalmic solution Place 1 drop into both eyes at bedtime.   Yes [provider]  LORazepam (ATIVAN) 0.5 MG tablet Take 0.5 mg by mouth every 6 (six) hours as needed  for anxiety (panic attack).   Yes [provider]  losartan (COZAAR) 25 MG tablet Take 25 mg by mouth daily.   Yes [provider]  methotrexate (RHEUMATREX) 2.5 MG tablet Take 17.5 mg by mouth every Thursday. 01/06/21  Yes [provider]  nystatin cream (MYCOSTATIN) Apply 1 application topically 3 (three) times daily. To fungal rash until clear   Yes [provider]  pantoprazole (PROTONIX) 20 MG tablet Take 20 mg by mouth 2 (two) times daily. 09/03/20  Yes [provider]  PARoxetine (PAXIL) 20 MG tablet Take 1 tablet by mouth every morning. 11/06/20  Yes [provider]  potassium chloride 20 MEQ/15ML (10%) SOLN Take 20 mEq by mouth 2 (two) times daily. Dilute in 1/2 glass of water/juice 11/15/20  Yes [provider]  simethicone (MYLICON) 734 MG chewable tablet Chew 125 mg by mouth See admin instructions. Before meals and at bedtime x 10 days   Yes [provider]  ACCU-CHEK AVIVA PLUS test strip  09/05/20   [provider]  sodium chloride flush (NS) 0.9 % SOLN 10-40 mLs by Intracatheter route every 12 (twelve) hours. Patient not taking: No sig reported 08/21/18   Omar Person, NP  amLODipine (NORVASC) 10 MG tablet Place 1 tablet (10 mg total) into feeding tube daily. Patient not taking: Reported on 04/07/2021 08/21/18 04/03/2021  Omar Person, NP  enoxaparin (LOVENOX) 40 MG/0.4ML injection Inject 0.4 mLs (40 mg total) into the skin daily. Patient not taking: Reported on 03/23/2021 08/21/18 03/24/2021  Omar Person, NP  insulin glargine (LANTUS) 100 UNIT/ML injection Inject 0.1 mLs (10 Units total) into the skin daily. Patient not taking: Reported on 04/10/2021 08/21/18 03/28/2021  Omar Person, NP  labetalol (NORMODYNE,TRANDATE) 5 MG/ML injection Inject 2 mLs (10 mg total) into the vein every 6 (six) hours as needed (systolic greater than 287). Patient not taking: Reported on 04/05/2021 08/20/18 04/10/2021   Omar Person, NP  levalbuterol Penne Lash) 1.25 MG/0.5ML  nebulizer solution Take 1.25 mg by nebulization every 6 (six) hours. Patient not taking: Reported on 03/28/2021 08/21/18 03/27/2021  Omar Person, NP  levETIRAcetam (KEPRRA) 500 MG/100ML SOLN Inject 100 mLs (500 mg total) into the vein every 12 (twelve) hours. Patient not taking: Reported on 04/09/2021 08/21/18 03/19/2021  Omar Person, NP   No Known Allergies Review of Systems  Physical Exam  Vital Signs: BP (!) 186/75   Pulse (!) 112   Temp 98.3 F (36.8 C) (Oral)   Resp 16   Ht 5' 9"  (1.753 m)   Wt 79.7 kg   SpO2 97%   BMI 25.95 kg/m  Pain Scale: 0-10   Pain Score: 0-No pain   SpO2: SpO2: 97 % O2 Device:SpO2: 97 % O2 Flow Rate: .O2 Flow Rate (L/min): 0 L/min  IO: Intake/output summary:  Intake/Output Summary (Last 24 hours) at 04/01/2021 1242 Last data filed at 04/01/2021 1135 Gross per 24 hour  Intake 1520.07 ml  Output 1475 ml  Net 45.07 ml    LBM: Last BM Date: 03/30/21 Baseline Weight: Weight: 68 kg Most recent weight: Weight: 79.7 kg     Palliative Assessment/Data:     Thank you for this consult. Palliative medicine will continue to follow and assist as needed.   Time In: 1332 Time Out: 1501 Time Total: 89 minutes Greater than 50%  of this time was spent counseling and coordinating care related to the above assessment and plan.  Signed by: Mariana Kaufman, AGNP-C Palliative Medicine    Please contact Palliative Medicine Team phone at [phone] for questions and concerns.  For individual provider: See Shea Evans

## 2021-04-01 NOTE — Progress Notes (Signed)
SLP Cancellation Note  Patient Details Name: Wayne Young MRN: [id number] DOB: 04-24-1956   Cancelled treatment:       Reason Eval/Treat Not Completed: Other (comment) Palliative consult hopefully later today. Pt was able to communicate with MD yesterday and reported he wanted a PEG tube placed. He is still too lethargic to consider any swallowing testing and MD discussed recommendation with him that with a PEG tube he would stay NPO. There is a possibility of repeat swallowing testing when/if he feels better to determine if a water protocol or something with low risk could be considered for comfort. This SLP will only be available until 11 am today, so if inline PMSV trials with family and palliative are needed then tomorrow may be best time to schedule. I will remain assigned, please text through secure chat to suggest a time and I can reply tomorrow am    Leibish Mcgregor, Riley Nearing 04/01/2021, 9:18 AM

## 2021-04-01 NOTE — Progress Notes (Signed)
ABG ordered and obtained on vent. Vent settings of VT 560, RR 16 FIO2 40%, PEEP 5. No further vent changes at this time.    Ref. Range 04/01/2021 18:16  Sample type Unknown ARTERIAL  pH, Arterial Latest Ref Range: 7.350 - 7.450  7.223 (L)  pCO2 arterial Latest Ref Range: 32.0 - 48.0 mmHg 41.8  pO2, Arterial Latest Ref Range: 83.0 - 108.0 mmHg 102  TCO2 Latest Ref Range: 22 - 32 mmol/L 18 (L)  Acid-base deficit Latest Ref Range: 0.0 - 2.0 mmol/L 10.0 (H)  Bicarbonate Latest Ref Range: 20.0 - 28.0 mmol/L 17.2 (L)  O2 Saturation Latest Units: % 97.0  Patient temperature Unknown 98.7 F  Collection site Unknown Radial

## 2021-04-01 NOTE — Consult Note (Addendum)
Neurology Consultation  Reason for Consult: Concern for acutely non-reactive right pupil  Referring Physician: Dr. Wynelle Cleveland  CC: Right pupillary change  History is obtained from: Bedside RN, Chart review, unable to obtain information from patient due to patient condition  HPI: Wayne Young is a 65 y.o. male with a medical history significant for type 2 diabetes mellitis, hypertension, glaucoma, arthritis, ALS with quadriplegia s/p teacheostomy on ventilator, chronic respiratory failure, failure to thrive, and weight loss since ALS diagnosis, who was admitted for gastrostomy tube placement in IR secondary due to aspiration pneumonia and dysphagia. While in the ICU, bedside nurses noted at 13:00 today that Wayne Young's pupils were 3 mm, equal, round, and briskly reactive to light bilaterally; on reassessment at 18:50, Wayne Young right pupil was noted to be 4 mm and felt to be non-reactive. A code stroke was activated. On Neurology evaluation, Wayne Young was found to have fluctuating right pupil size and reactivity with findings equivocal for a right relative afferent pupillary defect versus right pupillary sphincter dysfunction. Tetraplegia did not allow for evaluation of a possible lateralized motor deficit, but no facial droop or forced gaze deviation was noted on exam. Code stroke was cancelled. Recommendation to obtain MRI brain was discussed with the ICU attending at the bedside.    LKW: 13:00 at which time pupils were equally reactive at 3 mm and brisk per bedside RN tpa given?: No, risks significantly outweigh potential benefits given that only acute abnormality was pupillary dysfunction IR Thrombectomy? No, presentation not consistent with large vessel occlusion, low suspicion for LVO on initial examination.  Modified Rankin Scale: 5-Severe disability-bedridden, incontinent, needs constant attention  ROS: Unable to obtain due to altered mental status.   Past Medical History:   Diagnosis Date   Arthritis    Diabetes mellitus without complication ([place])    Glaucoma    Hypertension   ALS  Past Surgical History:  Procedure Laterality Date   HERNIA REPAIR     Family History  Problem Relation Age of Onset   Asthma Mother    Social History:   reports that he has quit smoking. He has never used smokeless tobacco. He reports that he does not drink alcohol and does not use drugs.  Medications  Current Facility-Administered Medications:    0.9 % NaCl with KCl 20 mEq/ L  infusion, , Intravenous, Continuous, Rizwan, Saima, MD, Last Rate: 125 mL/hr at 04/01/21 1800, Infusion Verify at 04/01/21 1800   acetaminophen (TYLENOL) tablet 650 mg, 650 mg, Per Tube, Q6H PRN, Tamala Julian, Rondell A, MD   Ampicillin-Sulbactam (UNASYN) 3 g in sodium chloride 0.9 % 100 mL IVPB, 3 g, Intravenous, Q8H, Rizwan, Saima, MD, Stopped at 04/01/21 1300   chlorhexidine gluconate (MEDLINE KIT) (PERIDEX) 0.12 % solution 15 mL, 15 mL, Mouth Rinse, BID, Rise Patience, MD, 15 mL at 04/01/21 0825   Chlorhexidine Gluconate Cloth 2 % PADS 6 each, 6 each, Topical, Daily, Rise Patience, MD, 6 each at 03/31/21 1631   [START ON 04/03/2021] enoxaparin (LOVENOX) injection 40 mg, 40 mg, Subcutaneous, Q24H, Turpin, Pamela, PA-C   famotidine (PEPCID) IVPB 20 mg premix, 20 mg, Intravenous, Q12H, Debbe Odea, MD, Stopped at 00/17/49 4496   folic acid injection 1 mg, 1 mg, Intravenous, Daily, Rizwan, Saima, MD, 1 mg at 04/01/21 0957   ipratropium-albuterol (DUONEB) 0.5-2.5 (3) MG/3ML nebulizer solution 3 mL, 3 mL, Nebulization, TID, Rizwan, Saima, MD, 3 mL at 04/01/21 1504   labetalol (NORMODYNE) injection 5 mg, 5 mg, Intravenous, Q2H  PRN, Fuller Plan A, MD, 5 mg at 04/01/21 1131   latanoprost (XALATAN) 0.005 % ophthalmic solution 1 drop, 1 drop, Both Eyes, QHS, Smith, Rondell A, MD, 1 drop at 03/31/21 2045   LORazepam (ATIVAN) tablet 0.5 mg, 0.5 mg, Oral, QHS, Rizwan, Saima, MD, 0.5 mg at 03/31/21  2041   MEDLINE mouth rinse, 15 mL, Mouth Rinse, 10 times per day, Rise Patience, MD, 15 mL at 04/01/21 1757   metoprolol tartrate (LOPRESSOR) injection 2.5 mg, 2.5 mg, Intravenous, Q8H, Rizwan, Saima, MD, 2.5 mg at 04/01/21 1418   PARoxetine (PAXIL) tablet 20 mg, 20 mg, Oral, q morning, Smith, Rondell A, MD, 20 mg at 03/30/21 0906   polyvinyl alcohol (LIQUIFILM TEARS) 1.4 % ophthalmic solution 1 drop, 1 drop, Both Eyes, PRN, Donnamae Jude, RPH, 1 drop at 03/30/21 2350   polyvinyl alcohol (LIQUIFILM TEARS) 1.4 % ophthalmic solution 1 drop, 1 drop, Both Eyes, TID, Mahan, Kasie J, NP, 1 drop at 04/01/21 1605   senna-docusate (Senokot-S) tablet 1 tablet, 1 tablet, Oral, QHS, Rizwan, Eunice Blase, MD, 1 tablet at 03/31/21 2040   sodium chloride HYPERTONIC 3 % nebulizer solution 4 mL, 4 mL, Nebulization, BID, Ollis, Brandi L, NP, 4 mL at 04/01/21 0744  Exam: Current vital signs: BP (!) 173/74   Pulse (!) 118   Temp 98.7 F (37.1 C) (Oral)   Resp 16   Ht 5' 9"  (1.753 m)   Wt 79.7 kg   SpO2 96%   BMI 25.95 kg/m  Vital signs in last 24 hours: Temp:  [96.3 F (35.7 C)-98.7 F (37.1 C)] 98.7 F (37.1 C) (06/14 1629) Pulse Rate:  [77-118] 118 (06/14 1800) Resp:  [15-16] 16 (06/14 1800) BP: (96-186)/(56-87) 173/74 (06/14 1800) SpO2:  [96 %-100 %] 96 % (06/14 1800) FiO2 (%):  [40 %] 40 % (06/14 1600)  GENERAL: Laying comfortably in bed, in no acute distress Head: - Normocephalic and atraumatic without obvious abnormality EENT: Normal conjunctivae, tracheostomy in place secured with trach collar midline neck LUNGS: On mechanical ventilator via tracheostomy, SpO2 96% on cardiac monitor CV: Tachycardia on cardiac monitor, lower extremities with 1+ edema bilaterally ABDOMEN: Soft, non-distended Ext: warm, well perfused, without obvious deformity  NEURO:  Mental Status: Awake, opens eyes to voice. Occasionally follows basic commands such as "stick out your tongue" but inconsistently. Patient is  unable to communicate verbally at baseline in the context of ALS and does not nod or shake head to any questions. He was able to blink twice to command to say "yes" on two occasions to answer examiner questions.    Cranial Nerves:  II: Left pupil 3 mm and briskly reactive to light. Right pupil 4 mm with possible relative afferent pupillary defect, but right pupil also exhibited hippus, contracting and dilating erratically in ambient light while left pupil remained 3 mm.  III, IV, VI: EOMI with occasional saccadic tracking to command to the right and left. He did attend to stimuli in both temporal visual fields V: Unable to assess due to patient condition VII: Face appears symmetric resting and with weak smiling.  VIII: Hearing was intact to some commands IX, X: Unable to assess due to patient condition XI: Unable to assess due to severe weakness XII: Tongue protrudes midline briefly, but not long enough to definitively assess for fasciculations.   Motor:  Quadriplegia 2/2 ALS. Tone is flaccid.  Sensation: Unable to assess due to patient's condition Coordination: Unable to assess due to patient's condition  DTRs: Areflexic throughout.  Plantars: Mute bilaterally Gait: Unable to assess  NIHSS: 1a Level of Conscious.: 1 1b LOC Questions: 2 1c LOC Commands: 1 2 Best Gaze: 0 3 Visual: 0 4 Facial Palsy: 0 5a Motor Arm - left: 4 5b Motor Arm - Right: 4 6a Motor Leg - Left: 4 6b Motor Leg - Right: 4 7 Limb Ataxia: 0 8 Sensory: 0 9 Best Language: 2 10 Dysarthria: 3 11 Extinct. and Inatten.: 0 TOTAL: 25 - baseline  Labs I have reviewed labs in epic and the results pertinent to this consultation are: CBC    Component Value Date/Time   WBC 12.1 (H) 03/31/2021 0350   RBC 4.91 03/31/2021 0350   HGB 13.9 04/01/2021 1816   HCT 41.0 04/01/2021 1816   PLT 255 03/31/2021 0350   MCV 84.5 03/31/2021 0350   MCH 26.5 03/31/2021 0350   MCHC 31.3 03/31/2021 0350   RDW 16.6 (H) 03/31/2021 0350    LYMPHSABS 1.3 03/21/2021 0515   MONOABS 0.8 04/10/2021 0515   EOSABS 0.0 03/25/2021 0515   BASOSABS 0.0 04/07/2021 0515   CMP     Component Value Date/Time   NA 147 (H) 04/01/2021 1816   K 3.9 04/01/2021 1816   CL 109 04/01/2021 0114   CO2 16 (L) 04/01/2021 0114   GLUCOSE 128 (H) 04/01/2021 0114   BUN 11 04/01/2021 0114   CREATININE 0.55 (L) 04/01/2021 0114   CALCIUM 9.4 04/01/2021 0114   PROT 8.1 04/04/2021 0515   ALBUMIN 3.1 (L) 04/17/2021 0515   AST 16 03/25/2021 0515   ALT 14 03/21/2021 0515   ALKPHOS 84 03/25/2021 0515   BILITOT 1.2 03/27/2021 0515   GFRNONAA >60 04/01/2021 0114   GFRAA >60 08/20/2018 0409   Lipid Panel     Component Value Date/Time   TRIG 47 08/10/2018 0528   Imaging I have reviewed the images obtained:  MRI examination of the brain pending  Assessment: 65 y.o. male with a history of HTN, ALS, quadriplegia, dysphagia, and aspiration pneumonia s/p tracheostomy requiring home ventilator who is awaiting PEG placement via IR. Bedside RN noted pupillary changes today concerning for acute nonreactive right pupil. - Examination reveals left pupil 3 mm and briskly reactive to light, right pupil 4 mm with possible relative afferent pupillary defect, but right pupil also exhibited hippus, contracting and dilating erratically in ambient light while left pupil remained 3 mm. Also noted was baseline flaccid paralysis and areflexia secondary to known motor neuron disease diagnosis. The code stroke was cancelled and the plan of care was discussed with ICU attending  - tPA was not administered due to patient at baseline function and no evidence of acute stroke on initial examination. It is possible that a right CRAO due to embolic phenomenon or CRVO due to in situ thrombosis could result in right RAPD. However, risks of tPA given diagnostic uncertainty significantly outweigh the potential benefits, which would be minimal even if right CRAO was present.  - DDx includes  right pupillary dysfunction of unknown etiology, right CRAO and right CRVO - Of note, his lack of reflexes would be atypical for ALS, but progressive muscular atrophy (PMA), late-onset spinal muscular atrophy and spinobulbar muscular atrophy (Kennedy's disease), which are also progressive and incurable motor neuron diseases, but are characterized by no UMN findings, are also possible. The distinction between these processes at this stage would be unlikely to result in significant change to his care plan or prognosis.   Impression: ALS  Quadriplegia Acute right relative afferent pupillary  defect  Recommendations: - MRI brain without contrast   Wayne Young, AGAC-NP [facility] Pager: [phone]  I have seen and examined the patient. I have formulated the assessment and recommendations. My exam findings were observed and documented by Wayne Henderson, NP.  Electronically signed: Dr. Kerney Elbe

## 2021-04-01 NOTE — Progress Notes (Signed)
Code stroke activated due to R non-reactive pupil. Rapid Response RN to bedside shortly after. Rizwan MD called primary RN and ordered NS bolus and CMET with Lactic Acid. Neuro MD and NP to bedside. Assessed patient and agreed pupils are not reacting like patient's baseline. However, patient followed commands intermittently. Code stroke cancelled and MRI ordered. Neuro MD and NP ordered for RN to assess patient's pupils q15 minutes for 2 hours and then to resume regular shift assessment routine afterwards.   Talmage Coin RN

## 2021-04-01 NOTE — Progress Notes (Signed)
PROGRESS NOTE    Wayne Young   UXN:[id number]  DOB: [birth year]  DOA: 03/21/2021 PCP: Cipriano Mile, NP   Brief Narrative:  Wayne Young is a 65 year old male with rheumatoid arthritis, ALS with a trach on home vent, diabetes mellitus, hypertension. He was brought to the hospital for oxygen saturations that were in the 80s after nebulizer treatments. In the ED: Temperature 101, elevated respiratory rate and mildly tachycardic. WBC count 17, potassium 2.8 Anasarca was noted on exam  CT scan of the chest reveals extensive left lung opacities and debris in the mainstem bronchus. He was started on vancomycin and cefepime for sepsis secondary to pneumonia.   Subjective: He has no complaints today.     Assessment & Plan:   Principal Problem:   Acute on chronic respiratory failure with hypoxia-Suspected Aspiration pneumonia in patient on chronic vent Severe sepsis Immune compromised state - PCCM assisting with vent management -Extensive consolidation in the left lung with a small effusion-central airway debris in both mainstem bronchi - no further fever after 6/10 -Tachycardia and leukocytosis improved WBC count is elevated at 16.8, procalcitonin 1.17> WBC improved to 12 - Strep pneumo urinary antigen negative, HIV negative -Sputum culture growing rare staph aureus and Proteus mirabilis-sensitivities pending -He is on cefepime-vancomycin 6/12 by me as MRSA PCR was negative-PCCM would like it to continue until full culture results - sputum culture > rare staph aureus - resistant only to Tetracycline, rare proteus mirabilis- intermediate resistance to Imipenem - will switch Cefepime and Vanc to Unasyn today - due to being immune compromised, he will need a longer course than usual - He also on hypertonic saline - his wife states he has a "coughilator" that she uses at home to help him cough up sputum- I have told her she is welcome to bring it-   Active Problems:  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 n.p.o. and pursue PEG versus hospice-I have had a discussion with the patient and his wife, explained the need for strict NPO due to high risk for aspiration, explained that despite PEG he is still at risk for aspirating saliva and tube feeds- Wife states that they have had these discussions in the past & patient has wanted the PEG when previously discussed- he again states that he would like to have a PEG tube with the understanding that he will need to remain strictly NPO but is also very sleepy and I am not certain if he is fully understanding my conversation -Consulted palliative care to also have this conversation with them - 6/14> discussed again with patient and he is still in agreement- placed consult for IR to place PEG - cont NPO- He is allowed to have whole med in apple sauce but he started refusing many of them when he became NPO- trying to switch as many meds to IV as possible - holding Lasix (no h/o CHF)- start IVF (NS + 20 meq KCL at 75 cc/hr)  BCID positive for Staphylococcus species UA suggestive of a UTI - Culture is revealing multiple species in the urine- - 1 set of blood culture growing gram-positive cocci in clusters-this revealed staph capitis and is likely a contaminant    Hypokalemia - related to oral Lasix? -Potassium 2.8-has improved to 3.5 - dropped again to 2.8-    -  He is supposed to be taking 20 mEq twice daily at home-  - holding oral K and replacing via IV- follow  Anasarca - He is on 20 mg of Lasix daily-he states he drinks a lot of fluid at home and his wife agrees -EF in 12/21 was 60 to 65% with mild LVH - Albumin level 3.1 - Prealbumin is 10 > Now n.p.o.,  DC'd Lasix    Rheumatoid arthritis with positive  rheumatoid factor  -He receives methotrexate every Thursday-  holding in setting of pneumonia    Hyperlipidemia -Lipitor discontinued (NPO)    Hypertension - holding oral meds> carvedilol losartan, furosemide  - started IV Lopressor 2.5 mg every 8 hrs    Type 2 diabetes mellitus, with long-term current use of insulin  -Empagliflozin, glipizide on hold - oral intake & appetite has been poor lately poor - As he is n.p.o., have discontinued NovoLog sliding scale to prevent hypoglycemia-continue to follow glucose levels  Deppresion - cont Paxil if he takes it  Insomnia - cont Ativan 0.5 QHS- wife states often times he needs a second dose as 1 dose nothing for him- follow- increase as needed    History of TB (tuberculosis)   Time spent in minutes: 45-extensive conversation with patient and wife in regards to PEG tube and his aspiration risks DVT prophylaxis: enoxaparin (LOVENOX) injection 40 mg Start: 03/22/2021 1000 Code Status: Full code Family Communication:  Level of Care: Level of care: ICU-due to being on a ventilator Disposition Plan:  Status is: Inpatient  Remains inpatient appropriate because:IV treatments appropriate due to intensity of illness or inability to take PO  Dispo: The patient is from: Home              Anticipated d/c is to: Home              Patient currently is not medically stable to d/c.   Difficult to place patient No      Consultants:  Pulmonary critical care managing ventilator Procedures:  none Antimicrobials:  Anti-infectives (From admission, onward)    Start     Dose/Rate Route Frequency Ordered Stop   03/31/21 2300  ceFEPIme (MAXIPIME) 2 g in sodium chloride 0.9 % 100 mL IVPB        2 g 200 mL/hr over 30 Minutes Intravenous Every 8 hours 03/31/21 1542     03/31/21 2200  vancomycin (VANCOREADY) IVPB 750 mg/150 mL        750 mg 150 mL/hr over 60 Minutes Intravenous Every 12 hours 03/31/21 1143     03/31/21 1045  vancomycin (VANCOREADY)  IVPB 1250 mg/250 mL        1,250 mg 166.7 mL/hr over 90 Minutes Intravenous  Once 03/31/21 0957 03/31/21 1302   03/24/2021 1700  vancomycin (VANCOREADY) IVPB 1000 mg/200 mL  Status:  Discontinued        1,000 mg 200 mL/hr over 60 Minutes Intravenous Every 12 hours 04/14/2021 0943 04/15/2021 1115   03/21/2021 1300  ceFEPIme (MAXIPIME) 2 g in sodium chloride 0.9 % 100 mL IVPB  Status:  Discontinued        2 g 200 mL/hr over 30 Minutes Intravenous Every 8 hours 03/22/2021 0833 03/31/21 1542   04/06/2021 1300  vancomycin (VANCOREADY) IVPB 1500 mg/300 mL  Status:  Discontinued        1,500 mg 150 mL/hr over 120 Minutes Intravenous Every 8 hours 04/06/2021 0941 03/19/2021 0943   03/28/2021 0530  vancomycin (VANCOREADY) IVPB 1000 mg/200 mL  Status:  Discontinued        1,000 mg 200 mL/hr over 60 Minutes Intravenous  Once 04/15/2021 0517 04/04/2021 0518   04/06/2021 0530  ceFEPIme (MAXIPIME) 2 g in sodium chloride 0.9 % 100 mL IVPB        2 g 200 mL/hr over 30 Minutes Intravenous  Once 04/05/2021 0517 03/19/2021 0549   03/31/2021 0530  vancomycin (VANCOREADY) IVPB 1250 mg/250 mL        1,250 mg 166.7 mL/hr over 90 Minutes Intravenous  Once 03/28/2021 0518 04/09/2021 0728        Objective: Vitals:   04/01/21 0800 04/01/21 0815 04/01/21 0900 04/01/21 1000  BP: (!) 142/67  (!) 147/61 (!) 183/73  Pulse: 84 87 96 (!) 101  Resp: _0 Temp:      TempSrc:      SpO2: 99% 99% 99% 98%  Weight:      Height:        Intake/Output Summary (Last 24 hours) at 04/01/2021 1034 Last data filed at 04/01/2021 1000 Gross per 24 hour  Intake 1596.29 ml  Output 1475 ml  Net 121.29 ml    Filed Weights   04/04/2021 0323 03/30/21 0600  Weight: 68 kg 79.7 kg    Examination: General exam: Appears comfortable  HEENT: PERRLA, oral mucosa moist, no sclera icterus or thrush- trach present Respiratory system: Clear to auscultation. Respiratory effort normal. Cardiovascular system: S1 & S2 heard, regular rate and  rhythm Gastrointestinal system: Abdomen soft, non-tender, nondistended. Normal bowel sounds   Central nervous system: Alert - unable to move extremities- able to mouth words, understand conversations, follow commands. Extremities: No cyanosis, clubbing or edema Skin: No rashes or ulcers Psychiatry:  appears depressed since discussions about PEG tube and NPO started     Data Reviewed: I have personally reviewed following labs and imaging studies  CBC: Recent Labs  Lab 04/03/2021 0443 04/12/2021 0515 04/12/2021 1038 03/30/21 0031 03/31/21 0350  WBC  --  17.0*  --  16.8* 12.1*  NEUTROABS  --  14.8*  --   --   --   HGB 15.6 14.3 13.3 14.4 13.0  HCT 46.0 45.5 39.0 46.5 41.5  MCV  --  82.3  --  83.2 84.5  PLT  --  278  --  303 374    Basic Metabolic Panel: Recent Labs  Lab 04/07/2021 0443 03/27/2021 0515 03/21/2021 0938 04/16/2021 1038 03/30/21 0031 03/31/21 0350 04/01/21 0114  NA 137 136  --  141 139 139 139  K 3.8 2.8*  --  2.3* 3.5 2.8* 3.4*  CL 103 100  --   --  106 106 109  CO2  --  24  --   --  22 17* 16*  GLUCOSE 84 84  --   --  96 129* 128*  BUN 10 7*  --   --  _1 CREATININE <0.20* 0.32*  --   --  <0.30* 0.53* 0.55*  CALCIUM  --  9.2  --   --  9.2 9.2 9.4  MG  --   --  1.9  --   --  2.1  --     GFR: Estimated Creatinine Clearance: 89.6 mL/min (A) (by C-G formula based on SCr of 0.55 mg/dL (L)). Liver Function Tests: Recent Labs  Lab 04/09/2021 0515  AST 16  ALT 14  ALKPHOS 84  BILITOT 1.2  PROT 8.1  ALBUMIN 3.1*    No results for input(s):  LIPASE, AMYLASE in the last 168 hours. Recent Labs  Lab 04/01/2021 1530  AMMONIA 11    Coagulation Profile: Recent Labs  Lab 03/26/2021 0515  INR 1.3*    Cardiac Enzymes: No results for input(s): CKTOTAL, CKMB, CKMBINDEX, TROPONINI in the last 168 hours. BNP (last 3 results) No results for input(s): PROBNP in the last 8760 hours. HbA1C: Recent Labs    03/22/2021 1530  HGBA1C 6.7*   CBG: Recent Labs  Lab  03/31/21 0656 03/31/21 1339 03/31/21 1625 04/01/21 0025 04/01/21 0750  GLUCAP 116* 110* 115* 125* 116*    Lipid Profile: No results for input(s): CHOL, HDL, LDLCALC, TRIG, CHOLHDL, LDLDIRECT in the last 72 hours. Thyroid Function Tests: No results for input(s): TSH, T4TOTAL, FREET4, T3FREE, THYROIDAB in the last 72 hours. Anemia Panel: No results for input(s): VITAMINB12, FOLATE, FERRITIN, TIBC, IRON, RETICCTPCT in the last 72 hours. Urine analysis:    Component Value Date/Time   COLORURINE AMBER (A) 04/10/2021 0335   APPEARANCEUR CLOUDY (A) 04/02/2021 0335   LABSPEC 1.009 04/06/2021 0335   PHURINE 7.0 03/25/2021 0335   GLUCOSEU >=500 (A) 03/26/2021 0335   HGBUR MODERATE (A) 04/15/2021 0335   BILIRUBINUR NEGATIVE 03/23/2021 0335   KETONESUR NEGATIVE 04/14/2021 0335   PROTEINUR 100 (A) 04/03/2021 0335   NITRITE NEGATIVE 03/23/2021 0335   LEUKOCYTESUR LARGE (A) 03/26/2021 0335   Sepsis Labs: _0 (procalcitonin:4,lacticidven:4) ) Recent Results (from the past 240 hour(s))  Urine culture     Status: Abnormal   Collection Time: 04/01/2021  3:35 AM   Specimen: In/Out Cath Urine  Result Value Ref Range Status   Specimen Description IN/OUT CATH URINE  Final   Special Requests   Final    NONE Performed at [facility], [place] [address] [place], [place] [address]    Culture MULTIPLE SPECIES PRESENT, SUGGEST RECOLLECTION (A)  Final   Report Status 03/30/2021 FINAL  Final  Resp Panel by RT-PCR (Flu A&B, Covid) Nasopharyngeal Swab     Status: None   Collection Time: 03/19/2021  3:52 AM   Specimen: Nasopharyngeal Swab; Nasopharyngeal(NP) swabs in vial transport medium  Result Value Ref Range Status   SARS Coronavirus 2 by RT PCR NEGATIVE NEGATIVE Final    Comment: (NOTE) SARS-CoV-2 target nucleic acids are NOT DETECTED.  The SARS-CoV-2 RNA is generally detectable in upper respiratory specimens during the acute phase of infection. The lowest concentration of SARS-CoV-2  viral copies this assay can detect is 138 copies/mL. A negative result does not preclude SARS-Cov-2 infection and should not be used as the sole basis for treatment or other patient management decisions. A negative result may occur with  improper specimen collection/handling, submission of specimen other than nasopharyngeal swab, presence of viral mutation(s) within the areas targeted by this assay, and inadequate number of viral copies(<138 copies/mL). A negative result must be combined with clinical observations, patient history, and epidemiological information. The expected result is Negative.  Fact Sheet for Patients:  EntrepreneurPulse.com.au  Fact Sheet for Healthcare Providers:  IncredibleEmployment.be  This test is no t yet approved or cleared by the [place] FDA and  has been authorized for detection and/or diagnosis of SARS-CoV-2 by FDA under an Emergency Use Authorization (EUA). This EUA will remain  in effect (meaning this test can be used) for the duration of the COVID-19 declaration under Section 564(b)(1) of the Act, 21 U.S.C.section 360bbb-3(b)(1), unless the authorization is terminated  or revoked sooner.       Influenza A by PCR NEGATIVE NEGATIVE  Final   Influenza B by PCR NEGATIVE NEGATIVE Final    Comment: (NOTE) The Xpert Xpress SARS-CoV-2/FLU/RSV plus assay is intended as an aid in the diagnosis of influenza from Nasopharyngeal swab specimens and should not be used as a sole basis for treatment. Nasal washings and aspirates are unacceptable for Xpert Xpress SARS-CoV-2/FLU/RSV testing.  Fact Sheet for Patients: EntrepreneurPulse.com.au  Fact Sheet for Healthcare Providers: IncredibleEmployment.be  This test is not yet approved or cleared by the [place] FDA and has been authorized for detection and/or diagnosis of SARS-CoV-2 by FDA under an Emergency Use Authorization  (EUA). This EUA will remain in effect (meaning this test can be used) for the duration of the COVID-19 declaration under Section 564(b)(1) of the Act, 21 U.S.C. section 360bbb-3(b)(1), unless the authorization is terminated or revoked.  Performed at [facility], [place] [address] [place], [place] [address]   Blood Culture (routine x 2)     Status: Abnormal   Collection Time: 04/15/2021  5:08 AM   Specimen: BLOOD  Result Value Ref Range Status   Specimen Description   Final    BLOOD LEFT ANTECUBITAL Performed at [facility], [address] [place], [place] [address]    Special Requests   Final    BOTTLES DRAWN AEROBIC AND ANAEROBIC Blood Culture adequate volume Performed at [facility], [address] [place], [place] [address]    Culture  Setup Time   Final    GRAM POSITIVE COCCI IN CLUSTERS IN BOTH AEROBIC AND ANAEROBIC BOTTLES CRITICAL RESULT CALLED TO, READ BACK BY AND VERIFIED WITH: V. BRYK,PHARMD [id number] 03/30/2021 T. TYSOR    Culture (A)  Final    STAPHYLOCOCCUS CAPITIS THE SIGNIFICANCE OF ISOLATING THIS ORGANISM FROM A SINGLE SET OF BLOOD CULTURES WHEN MULTIPLE SETS ARE DRAWN IS UNCERTAIN. PLEASE NOTIFY THE MICROBIOLOGY DEPARTMENT WITHIN ONE WEEK IF SPECIATION AND SENSITIVITIES ARE REQUIRED. Performed at [facility], [place] [address] [place],  [address]    Report Status 04/01/2021 FINAL  Final  Blood Culture ID Panel (Reflexed)     Status: Abnormal   Collection Time: 04/02/2021  5:08 AM  Result Value Ref Range Status   Enterococcus faecalis NOT DETECTED NOT DETECTED Final   Enterococcus Faecium NOT DETECTED NOT DETECTED Final   Listeria monocytogenes NOT DETECTED NOT DETECTED Final   Staphylococcus species DETECTED (A) NOT DETECTED Final    Comment: CRITICAL RESULT CALLED TO, READ BACK BY AND VERIFIED WITH: V. Beecher Mcardle [id number] 03/30/2021 T. TYSOR    Staphylococcus aureus (BCID) NOT DETECTED NOT DETECTED Final   Staphylococcus  epidermidis NOT DETECTED NOT DETECTED Final   Staphylococcus lugdunensis NOT DETECTED NOT DETECTED Final   Streptococcus species NOT DETECTED NOT DETECTED Final   Streptococcus agalactiae NOT DETECTED NOT DETECTED Final   Streptococcus pneumoniae NOT DETECTED NOT DETECTED Final   Streptococcus pyogenes NOT DETECTED NOT DETECTED Final   A.calcoaceticus-baumannii NOT DETECTED NOT DETECTED Final   Bacteroides fragilis NOT DETECTED NOT DETECTED Final   Enterobacterales NOT DETECTED NOT DETECTED Final   Enterobacter cloacae complex NOT DETECTED NOT DETECTED Final   Escherichia coli NOT DETECTED NOT DETECTED Final   Klebsiella aerogenes NOT DETECTED NOT DETECTED Final   Klebsiella oxytoca NOT DETECTED NOT DETECTED Final   Klebsiella pneumoniae NOT DETECTED NOT DETECTED Final   Proteus species NOT DETECTED NOT DETECTED Final   Salmonella species NOT DETECTED NOT DETECTED Final   Serratia marcescens NOT DETECTED NOT DETECTED Final  Haemophilus influenzae NOT DETECTED NOT DETECTED Final   Neisseria meningitidis NOT DETECTED NOT DETECTED Final   Pseudomonas aeruginosa NOT DETECTED NOT DETECTED Final   Stenotrophomonas maltophilia NOT DETECTED NOT DETECTED Final   Candida albicans NOT DETECTED NOT DETECTED Final   Candida auris NOT DETECTED NOT DETECTED Final   Candida glabrata NOT DETECTED NOT DETECTED Final   Candida krusei NOT DETECTED NOT DETECTED Final   Candida parapsilosis NOT DETECTED NOT DETECTED Final   Candida tropicalis NOT DETECTED NOT DETECTED Final   Cryptococcus neoformans/gattii NOT DETECTED NOT DETECTED Final    Comment: Performed at [facility], [place] [address] [place], [place] [address]  Expectorated Sputum Assessment w Gram Stain, Rflx to Resp Cult     Status: None   Collection Time: 03/23/2021  8:31 AM   Specimen: Expectorated Sputum  Result Value Ref Range Status   Specimen Description EXPECTORATED SPUTUM  Final   Special Requests NONE  Final   Sputum evaluation    Final    THIS SPECIMEN IS ACCEPTABLE FOR SPUTUM CULTURE Performed at [facility], [place] [address] [place], [place] [address]    Report Status 03/23/2021 FINAL  Final  MRSA Next Gen by PCR, Nasal     Status: None   Collection Time: 03/30/2021  8:31 AM  Result Value Ref Range Status   MRSA by PCR Next Gen NOT DETECTED NOT DETECTED Final    Comment: (NOTE) The GeneXpert MRSA Assay (FDA approved for NASAL specimens only), is one component of a comprehensive MRSA colonization surveillance program. It is not intended to diagnose MRSA infection nor to guide or monitor treatment for MRSA infections. Test performance is not FDA approved in patients less than 35 years old. Performed at [facility], [place] [address] [place], [place] [address]   Culture, Respiratory w Gram Stain     Status: None   Collection Time: 03/31/2021  8:31 AM  Result Value Ref Range Status   Specimen Description EXPECTORATED SPUTUM  Final   Special Requests NONE Reflexed from [id number]  Final   Gram Stain   Final    ABUNDANT WBC PRESENT, PREDOMINANTLY PMN RARE GRAM POSITIVE COCCI Performed at [facility], [place] [address] [place], [place] [address]    Culture   Final    RARE STAPHYLOCOCCUS AUREUS RARE PROTEUS MIRABILIS    Report Status 04/01/2021 FINAL  Final   Organism ID, Bacteria STAPHYLOCOCCUS AUREUS  Final   Organism ID, Bacteria PROTEUS MIRABILIS  Final      Susceptibility   Proteus mirabilis - MIC*    AMPICILLIN <=2 SENSITIVE Sensitive     CEFAZOLIN <=4 SENSITIVE Sensitive     CEFEPIME <=0.12 SENSITIVE Sensitive     CEFTAZIDIME <=1 SENSITIVE Sensitive     CEFTRIAXONE <=0.25 SENSITIVE Sensitive     CIPROFLOXACIN <=0.25 SENSITIVE Sensitive     GENTAMICIN <=1 SENSITIVE Sensitive     IMIPENEM 8 INTERMEDIATE Intermediate     TRIMETH/SULFA <=20 SENSITIVE Sensitive     AMPICILLIN/SULBACTAM <=2 SENSITIVE Sensitive     PIP/TAZO <=4 SENSITIVE Sensitive     * RARE PROTEUS MIRABILIS    Staphylococcus aureus - MIC*    CIPROFLOXACIN <=0.5 SENSITIVE Sensitive     ERYTHROMYCIN <=0.25 SENSITIVE Sensitive     GENTAMICIN <=0.5 SENSITIVE Sensitive     OXACILLIN 0.5 SENSITIVE Sensitive     TETRACYCLINE >=16 RESISTANT Resistant     VANCOMYCIN 1 SENSITIVE Sensitive     TRIMETH/SULFA <=10 SENSITIVE  Sensitive     CLINDAMYCIN <=0.25 SENSITIVE Sensitive     RIFAMPIN <=0.5 SENSITIVE Sensitive     Inducible Clindamycin NEGATIVE Sensitive     * RARE STAPHYLOCOCCUS AUREUS  Blood Culture (routine x 2)     Status: None (Preliminary result)   Collection Time: 04/17/2021  9:38 AM   Specimen: BLOOD RIGHT HAND  Result Value Ref Range Status   Specimen Description BLOOD RIGHT HAND  Final   Special Requests   Final    BOTTLES DRAWN AEROBIC AND ANAEROBIC Blood Culture adequate volume   Culture   Final    NO GROWTH 3 DAYS Performed at [facility], [address], [place], [place] [address]    Report Status PENDING  Incomplete         Radiology Studies: No results found.    Scheduled Meds:  chlorhexidine gluconate (MEDLINE KIT)  15 mL Mouth Rinse BID   Chlorhexidine Gluconate Cloth  6 each Topical Daily   enoxaparin (LOVENOX) injection  40 mg Subcutaneous P39S   folic acid  1 mg Intravenous Daily   ipratropium-albuterol  3 mL Nebulization TID   latanoprost  1 drop Both Eyes QHS   LORazepam  0.5 mg Oral QHS   mouth rinse  15 mL Mouth Rinse 10 times per day   metoprolol tartrate  2.5 mg Intravenous Q8H   PARoxetine  20 mg Oral q morning   senna-docusate  1 tablet Oral QHS   sodium chloride HYPERTONIC  4 mL Nebulization BID   Continuous Infusions:  ceFEPime (MAXIPIME) IV Stopped (04/01/21 8864)   famotidine (PEPCID) IV 100 mL/hr at 04/01/21 1000   vancomycin Stopped (03/31/21 2225)     LOS: 3 days      Debbe Odea, MD [facility] Pager: [email or web address] 04/01/2021, 10:34 AM

## 2021-04-01 NOTE — Code Documentation (Signed)
Stroke Response Nurse Documentation Code Documentation  Wayne Young is a 65 y.o. male admitted to [facility] for principle problem of acute on chronic respiratory failure. With past medical hx of ALS, HTN, DM. Code stroke was activated by primary RN. Patient LKW at 1300 and now with new finding of right pupil non-reactive. On No antithrombotic. Labs drawn. Left pupil 17mm, equal, round, light reactive. Right pupil 77mm, equal, round, non-reactive to direct light.  Patient is not a candidate for tPA due to low suspicion for stroke, LKW 1300.   Care/Plan:  q15 min pupil checks x2 hours, then per unit protocol Bedside handoff with [place], RN  Wayne Young  Rapid Response RN

## 2021-04-01 NOTE — Plan of Care (Signed)
  Problem: Activity: Goal: Ability to tolerate increased activity will improve Outcome: Progressing   Problem: Respiratory: Goal: Ability to maintain a clear airway and adequate ventilation will improve Outcome: Progressing   Problem: Role Relationship: Goal: Method of communication will improve Outcome: Progressing   Problem: Activity: Goal: Ability to tolerate increased activity will improve Outcome: Progressing   Problem: Clinical Measurements: Goal: Ability to maintain a body temperature in the normal range will improve Outcome: Progressing   Problem: Respiratory: Goal: Ability to maintain adequate ventilation will improve Outcome: Progressing Goal: Ability to maintain a clear airway will improve Outcome: Progressing   

## 2021-04-01 NOTE — Progress Notes (Signed)
Pharmacy Antibiotic Note  Wayne Young is a 65 y.o. male admitted on 04/08/2021 with aspiration  pneumonia.  Pharmacy was consulted for Vancomycin and cefepime dosing. Patient with ALS and likely decreased muscle mass  Crcl 65ml/min wbc 12 afebrile  Respiratory cx with Proteus  sensitive to all except tetracycline and staph aureus - S to all except imipenem BCx staph capitis  - probable contaminant  MRSA pcr negative   Plan: Stop vancomycin and cefepime Start Unasyn 3gm IV q8hr for 7 days   Height: 5\' 9"  (175.3 cm) Weight: 79.7 kg (175 lb 11.3 oz) IBW/kg (Calculated) : 70.7  Temp (24hrs), Avg:97.5 F (36.4 C), Min:96.3 F (35.7 C), Max:98.5 F (36.9 C)  Recent Labs  Lab 04/17/2021 0443 03/23/2021 0515 03/20/2021 0519 03/30/21 0031 03/31/21 0350 04/01/21 0114  WBC  --  17.0*  --  16.8* 12.1*  --   CREATININE <0.20* 0.32*  --  <0.30* 0.53* 0.55*  LATICACIDVEN  --   --  1.6  --   --   --      Estimated Creatinine Clearance: 89.6 mL/min (A) (by C-G formula based on SCr of 0.55 mg/dL (L)).    No Known Allergies  Antimicrobials this admission: Vanc 6/11 resume 6/13>6.14 Cefepime 6/11 > 6/14 Unasyn 6/14>  Dose adjustments this admission:   Microbiology results:  6/11 sputum proteus, staph aureus 6/11 BCx GPC    8/11 Pharm.D. CPP, BCPS Clinical Pharmacist [phone] 04/01/2021 12:02 PM

## 2021-04-02 ENCOUNTER — Inpatient Hospital Stay (HOSPITAL_COMMUNITY): Payer: Medicare HMO

## 2021-04-02 ENCOUNTER — Encounter (HOSPITAL_COMMUNITY): Payer: Self-pay | Admitting: Internal Medicine

## 2021-04-02 DIAGNOSIS — I6330 Cerebral infarction due to thrombosis of unspecified cerebral artery: Secondary | ICD-10-CM | POA: Diagnosis not present

## 2021-04-02 DIAGNOSIS — Z930 Tracheostomy status: Secondary | ICD-10-CM | POA: Diagnosis not present

## 2021-04-02 DIAGNOSIS — R1310 Dysphagia, unspecified: Secondary | ICD-10-CM

## 2021-04-02 DIAGNOSIS — I6389 Other cerebral infarction: Secondary | ICD-10-CM

## 2021-04-02 DIAGNOSIS — G1221 Amyotrophic lateral sclerosis: Secondary | ICD-10-CM | POA: Diagnosis not present

## 2021-04-02 HISTORY — PX: IR GASTROSTOMY TUBE MOD SED: IMG625

## 2021-04-02 LAB — ECHOCARDIOGRAM COMPLETE
AV Mean grad: 4 mmHg
AV Peak grad: 7.5 mmHg
Ao pk vel: 1.37 m/s
Area-P 1/2: 5.23 cm2
Calc EF: 53 %
Height: 69 in
Single Plane A2C EF: 58.3 %
Single Plane A4C EF: 44.7 %
Weight: 2811.31 oz

## 2021-04-02 LAB — LIPID PANEL
Cholesterol: 86 mg/dL (ref 0–200)
HDL: 28 mg/dL — ABNORMAL LOW (ref >40)
LDL Cholesterol: 45 mg/dL (ref 0–99)
Total CHOL/HDL Ratio: 3.1 RATIO
Triglycerides: 66 mg/dL (ref <150)
VLDL: 13 mg/dL (ref 0–40)

## 2021-04-02 LAB — CBC
HCT: 40.6 % (ref 39.0–52.0)
Hemoglobin: 12.8 g/dL — ABNORMAL LOW (ref 13.0–17.0)
MCH: 26.4 pg (ref 26.0–34.0)
MCHC: 31.5 g/dL (ref 30.0–36.0)
MCV: 83.7 fL (ref 80.0–100.0)
Platelets: 235 10*3/uL (ref 150–400)
RBC: 4.85 MIL/uL (ref 4.22–5.81)
RDW: 17.6 % — ABNORMAL HIGH (ref 11.5–15.5)
WBC: 12.7 10*3/uL — ABNORMAL HIGH (ref 4.0–10.5)
nRBC: 0 % (ref 0.0–0.2)

## 2021-04-02 LAB — GLUCOSE, CAPILLARY: Glucose-Capillary: 137 mg/dL — ABNORMAL HIGH (ref 70–99)

## 2021-04-02 MED ORDER — SODIUM BICARBONATE 650 MG PO TABS
1300.0000 mg | ORAL_TABLET | Freq: Three times a day (TID) | ORAL | Status: DC
  Administered 2021-04-02 – 2021-04-05 (×10): 1300 mg
  Filled 2021-04-02 (×12): qty 2

## 2021-04-02 MED ORDER — GLUCAGON HCL RDNA (DIAGNOSTIC) 1 MG IJ SOLR
INTRAMUSCULAR | Status: AC
  Filled 2021-04-02: qty 1

## 2021-04-02 MED ORDER — FENTANYL CITRATE (PF) 100 MCG/2ML IJ SOLN
INTRAMUSCULAR | Status: AC | PRN
  Administered 2021-04-02: 50 ug via INTRAVENOUS

## 2021-04-02 MED ORDER — IOHEXOL 350 MG/ML SOLN
50.0000 mL | Freq: Once | INTRAVENOUS | Status: AC | PRN
  Administered 2021-04-02: 50 mL via INTRAVENOUS

## 2021-04-02 MED ORDER — FENTANYL CITRATE (PF) 100 MCG/2ML IJ SOLN
INTRAMUSCULAR | Status: AC
  Filled 2021-04-02: qty 2

## 2021-04-02 MED ORDER — IOHEXOL 300 MG/ML  SOLN
50.0000 mL | Freq: Once | INTRAMUSCULAR | Status: AC | PRN
  Administered 2021-04-02: 20 mL

## 2021-04-02 MED ORDER — CEFAZOLIN SODIUM-DEXTROSE 2-4 GM/100ML-% IV SOLN
INTRAVENOUS | Status: AC | PRN
  Administered 2021-04-02: 2 g via INTRAVENOUS

## 2021-04-02 MED ORDER — MIDAZOLAM HCL 2 MG/2ML IJ SOLN
INTRAMUSCULAR | Status: AC
  Filled 2021-04-02: qty 2

## 2021-04-02 MED ORDER — MIDAZOLAM HCL 2 MG/2ML IJ SOLN: 1.0000 mg | Freq: Once | INTRAMUSCULAR | Status: AC

## 2021-04-02 MED ORDER — MIDAZOLAM HCL 2 MG/2ML IJ SOLN
INTRAMUSCULAR | Status: AC | PRN
  Administered 2021-04-02: 1 mg via INTRAVENOUS

## 2021-04-02 MED ORDER — FENTANYL CITRATE (PF) 100 MCG/2ML IJ SOLN: 50.0000 ug | Freq: Once | INTRAMUSCULAR | Status: AC

## 2021-04-02 MED ORDER — FENTANYL CITRATE (PF) 100 MCG/2ML IJ SOLN
INTRAMUSCULAR | Status: AC
  Administered 2021-04-02: 25 ug via INTRAVENOUS
  Filled 2021-04-02: qty 2

## 2021-04-02 MED ORDER — CEFAZOLIN SODIUM-DEXTROSE 2-4 GM/100ML-% IV SOLN
INTRAVENOUS | Status: AC
  Filled 2021-04-02: qty 100

## 2021-04-02 MED ORDER — ASPIRIN 81 MG PO CHEW: 81.0000 mg | CHEWABLE_TABLET | Freq: Every day | ORAL | Status: DC

## 2021-04-02 MED ORDER — GLUCAGON HCL RDNA (DIAGNOSTIC) 1 MG IJ SOLR
INTRAMUSCULAR | Status: AC | PRN
  Administered 2021-04-02: 1 mg via INTRAVENOUS

## 2021-04-02 MED ORDER — LIDOCAINE HCL (PF) 1 % IJ SOLN
INTRAMUSCULAR | Status: AC
  Filled 2021-04-02: qty 30

## 2021-04-02 MED ORDER — ACETYLCYSTEINE 20 % IN SOLN
4.0000 mL | Freq: Once | RESPIRATORY_TRACT | Status: DC
  Filled 2021-04-02 (×2): qty 4

## 2021-04-02 MED ORDER — MIDAZOLAM HCL 2 MG/2ML IJ SOLN
INTRAMUSCULAR | Status: AC
  Administered 2021-04-02: 1 mg via INTRAVENOUS
  Filled 2021-04-02: qty 2

## 2021-04-02 NOTE — Progress Notes (Signed)
Patient transported from room 2H23 to CT & back on the ventilator with no problems.

## 2021-04-02 NOTE — Progress Notes (Addendum)
PROGRESS NOTE    Wayne Young   KLK:[id number]  DOB: 1956/09/30  PCP: Cipriano Mile, NP    DOA: 04/02/2021 LOS: 4   Assessment & Plan   Principal Problem:   Acute on chronic respiratory failure with hypoxia (HCC) Active Problems:   Rheumatoid arthritis with positive rheumatoid factor (HCC)   Protein-calorie malnutrition, severe   Pressure injury of skin   Acute on chronic respiratory failure with hypoxemia (HCC)   ALS (amyotrophic lateral sclerosis) (HCC)   Hyperlipidemia   Hypertension   Type 2 diabetes mellitus, with long-term current use of insulin (HCC)   CAP (community acquired pneumonia)   Hypokalemia   Tracheostomy dependent (HCC)   Hypoalbuminemia   History of TB (tuberculosis)   Cerebral thrombosis with cerebral infarction    Acute on chronic respiratory failure with hypoxia due to Aspiration pneumonia in chronic vent patient with  Severe sepsis present on admission  (As evidenced by tachycardia, leukocytosis, PNA on imaging and worsened hypoxia consistent with organ dysfunction) Immune compromised state - PCCM assisting with vent management -Extensive consolidation in the left lung with a small effusion-central airway debris in both mainstem bronchi - no further fever after 6/10 -Tachycardia and leukocytosis improved WBC count is elevated at 16.8, procalcitonin 1.17> WBC improved to 12 - Strep pneumo urinary antigen negative, HIV negative -Sputum culture growing rare staph aureus and Proteus mirabilis-sensitivities pending -Initially treated with vancomycin and cefepime -Changed to Unasyn 6/14, continue -We will treat with a longer than usual course given immunocompromise status -Also on hypertonic saline -Uses device called a "coughilator" at home wife was told she is welcome to bring it   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 NPO and pursue PEG versus hospice.   --Goals of care discussions by prior attending noted --Palliative care consulted, appreciate assistance with [place] talks  --Continue NPO - may have whole med in apple sauce but he started refusing many of them when he became NPO --substitute PO meds to IV where able --holding Lasix (no h/o CHF) --Continue maintenance IVF (NS + 20 meq KCL at 75 cc/hr) --IR to place G-tube today  Anion gap metabolic acidosis - CO2 97>>94 today (6/15), gap 16. Unclear cause.  Normal lactate.  Not uremic or on diuretics.  No ketonuria on UA.  Monitor.  Consider renal consult.  Hold Jardiance.   Blood culture +Staphylococcus species Abnormal UA - ?UTI - Urine culture multiple species.  Single set of blood culture grew staph capitis and is likely a contaminant.  Monitor clinically.   Hypokalemia - likely due to Lasix; K was 2.8, replaced. Prescribed 20 mEq twice daily at home. --Monitor and replace by IV as needed for now   Acute CVA - punctate ischemic in Left ACA territory on MRI. Neurology consulted and feels this is incidental finding.  Suspect due to small vessel disease.   --Started on ASA - continue at d/c --Follow up echo --LDL 45, A1c 6.7% - both at goal  Right Eye RAPD - chronic, due to known blindness -  Diabetic retinopathy --See neurology note for details, confirmed by wife this is a known issue. --No further work up indicated    Robinhood 20 mg of Lasix daily; reports he drinks a lot of fluid at home and his wife agrees --Echo in Dec 2021  with EF 60 to 65% with mild LVH - Albumin level 3.1, Prealbumin is 10 --Home Lasix on hold (NPO) --Monitor volume status   Rheumatoid arthritis with positive rheumatoid factor -  On methotrexate weekly on Thursday -HOLDin setting of pneumonia   Hyperlipidemia - hold Lipitor while NPO   Hypertension - Home PO meds held:  Coreg, losartan, Lasix --IV Lopressor 2.5 mg every 8 hrs   Type 2 diabetes mellitus, with long-term current use of insulin -  --Holding empagliflozin, glipizide  --Recent poor OP intake.  -prior attending discontinued NovoLog SSI to prevent hypoglycemia --follow CBG's, place back on SSI if above inpatient goal 140-180   Depression - continue home Paxil   Insomnia -  - cont Ativan 0.5 QHS- wife states often times he needs a second dose as 1 dose nothing for him- follow- increase as needed   History of Tuberculosis - no acute issue    Pressure Injury of Skin - Present on Admission WOC consulted - see wound care recs. Frequent repositioning at least every 2 hrs.  Pressure Injury 08/03/18 Stage II -  Partial thickness loss of dermis presenting as a shallow open ulcer with a red, pink wound bed without slough. This is patchy areas of full thickness skin loss related to moisture associated skin damage, NOT a pressur (Active)  08/03/18 0800  Location: Sacrum  Location Orientation: Medial  Staging: Stage II -  Partial thickness loss of dermis presenting as a shallow open ulcer with a red, pink wound bed without slough.  Wound Description (Comments): This is patchy areas of full thickness skin loss related to moisture associated skin damage, NOT a pressure injury  Present on Admission: No     Pressure Injury 04/04/2021 Sacrum Medial Stage 1 -  Intact skin with non-blanchable redness of a localized area usually over a bony prominence. non-blanchable (Active)  04/13/2021 1600  Location: Sacrum  Location Orientation: Medial  Staging: Stage 1 -  Intact skin with non-blanchable redness of a localized area usually over a bony prominence.  Wound Description (Comments): non-blanchable  Present on Admission: Yes         Patient BMI: Body mass index is 25.95 kg/m.   DVT prophylaxis: enoxaparin (LOVENOX) injection 40 mg Start: 04/03/21 1000   Diet:  Diet Orders (From admission, onward)      Start     Ordered   03/31/21 1044  Diet NPO time specified Except for: Sips with Meds  Diet effective now       Comments: Medication in puree  Question:  Except for  Answer:  Ferrel Logan with Meds   03/31/21 1044              Code Status: DNR   Brief Narrative / Hospital Course to Date:   Wayne Young is a 65 year old male with rheumatoid arthritis, ALS with a trach on home vent, diabetes mellitus, hypertension. Presented due to oxygen desaturations into the 80s after nebulizer treatments.  In the ED, patient was febrile with temp 101 F, tachypneic and mildly tachycardic, with leukocytosis on CBC and  hypokalemia.  Anasarca noted on exam.  CT scan of the chest showed extensive left lung opacities and debris in the mainstem bronchus.    Started on vancomycin and cefepime for sepsis secondary to pneumonia.  Admitted to [facility] service.    Pulmonology consulted and following.      Subjective 04/02/21    Patient was sleeping but woke easily to voice when seen.  He was nonverbal  but follows basic commands.  No acute events reported   Disposition Plan & Communication   Status is: Inpatient  Remains inpatient appropriate because:Ongoing active pain requiring inpatient pain management and IV treatments appropriate due to intensity of illness or inability to take PO  Dispo: The patient is from: Home              Anticipated d/c is to: Home              Patient currently is not medically stable to d/c.   Difficult to place patient No  Family Communication: None present   Consults, Procedures, Significant Events   Consultants:  PCCM Interventional radiology Neurology Palliative care  Procedures:  G-tube placement 6/15 Bronchoscopy  Antimicrobials:  Anti-infectives (From admission, onward)    Start     Dose/Rate Route Frequency Ordered Stop   04/02/21 1540  ceFAZolin (ANCEF) IVPB 2g/100 mL premix        over 30 Minutes Intravenous Continuous PRN 04/02/21 1548     04/01/21  1230  Ampicillin-Sulbactam (UNASYN) 3 g in sodium chloride 0.9 % 100 mL IVPB        3 g 200 mL/hr over 30 Minutes Intravenous Every 8 hours 04/01/21 1140 04/09/21 0429   03/31/21 2300  ceFEPIme (MAXIPIME) 2 g in sodium chloride 0.9 % 100 mL IVPB  Status:  Discontinued        2 g 200 mL/hr over 30 Minutes Intravenous Every 8 hours 03/31/21 1542 04/01/21 1044   03/31/21 2200  vancomycin (VANCOREADY) IVPB 750 mg/150 mL  Status:  Discontinued        750 mg 150 mL/hr over 60 Minutes Intravenous Every 12 hours 03/31/21 1143 04/01/21 1044   03/31/21 1045  vancomycin (VANCOREADY) IVPB 1250 mg/250 mL        1,250 mg 166.7 mL/hr over 90 Minutes Intravenous  Once 03/31/21 0957 03/31/21 1302   04/16/2021 1700  vancomycin (VANCOREADY) IVPB 1000 mg/200 mL  Status:  Discontinued        1,000 mg 200 mL/hr over 60 Minutes Intravenous Every 12 hours 04/08/2021 0943 04/15/2021 1115   04/09/2021 1300  ceFEPIme (MAXIPIME) 2 g in sodium chloride 0.9 % 100 mL IVPB  Status:  Discontinued        2 g 200 mL/hr over 30 Minutes Intravenous Every 8 hours 04/14/2021 0833 03/31/21 1542   03/23/2021 1300  vancomycin (VANCOREADY) IVPB 1500 mg/300 mL  Status:  Discontinued        1,500 mg 150 mL/hr over 120 Minutes Intravenous Every 8 hours 04/05/2021 0941 03/31/2021 0943   04/01/2021 0530  vancomycin (VANCOREADY) IVPB 1000 mg/200 mL  Status:  Discontinued        1,000 mg 200 mL/hr over 60 Minutes Intravenous  Once 03/27/2021 0517 03/28/2021 0518   04/09/2021 0530  ceFEPIme (MAXIPIME) 2 g in sodium chloride 0.9 % 100 mL IVPB        2 g 200 mL/hr over 30 Minutes Intravenous  Once 04/08/2021 0517 04/16/2021 0549   04/13/2021 0530  vancomycin (VANCOREADY) IVPB 1250 mg/250 mL        1,250 mg 166.7 mL/hr over 90 Minutes Intravenous  Once 04/07/2021 0518 03/23/2021 0728         Micro    Objective   Vitals:   04/02/21 1540 04/02/21 1555 04/02/21 1600 04/02/21 1605  BP: (!) 174/89 (!) 165/62 119/76 115/73  Pulse: (!) 120 (!) 126 (!) 114 (!) 114   Resp: _0 16  Temp:      TempSrc:      SpO2: 100% 100% 100% 100%  Weight:      Height:        Intake/Output Summary (Last 24 hours) at 04/02/2021 1619 Last data filed at 04/02/2021 1200 Gross per 24 hour  Intake 2218.82 ml  Output 1850 ml  Net 368.82 ml   Filed Weights   04/13/2021 0323 03/30/21 0600  Weight: 68 kg 79.7 kg    Physical Exam:  General exam: sleeping comfortably, no acute distress, nonverbal HEENT: trach in place on vent, moist mucus membranesRespiratory system: Diminished breath sounds left more than right, no wheezes, rales or rhonchi, normal respiratory effort. Cardiovascular system: normal S1/S2, RRR Gastrointestinal system: soft, NT, ND,+bowel sounds. Skin: dry, intact, normal temperature, normal color   Labs   Data Reviewed: I have personally reviewed following labs and imaging studies  CBC: Recent Labs  Lab 04/17/2021 0515 04/07/2021 1038 03/30/21 0031 03/31/21 0350 04/01/21 1816 04/01/21 1831 04/02/21 0153  WBC 17.0*  --  16.8* 12.1*  --  12.3* 12.7*  NEUTROABS 14.8*  --   --   --   --   --   --   HGB 14.3   < > 14.4 13.0 13.9 13.3 12.8*  HCT 45.5   < > 46.5 41.5 41.0 43.7 40.6  MCV 82.3  --  83.2 84.5  --  85.9 83.7  PLT 278  --  303 255  --  279 235   < > = values in this interval not displayed.   Basic Metabolic Panel: Recent Labs  Lab 04/01/2021 0938 03/24/2021 1038 03/30/21 0031 03/31/21 0350 04/01/21 0114 04/01/21 1814 04/01/21 1816 04/01/21 1831  NA  --    < > 139 139 139 145 147* 142  K  --    < > 3.5 2.8* 3.4* 4.1 3.9 4.0  CL  --   --  106 106 109 112*  --  112*  CO2  --   --  22 17* 16* 16*  --  14*  GLUCOSE  --   --  96 129* 128* 127*  --  134*  BUN  --   --  _0 --  10  CREATININE  --   --  <0.30* 0.53* 0.55* 0.57*  --  0.58*  CALCIUM  --   --  9.2 9.2 9.4 10.0  --  9.6  MG 1.9  --   --  2.1  --   --   --   --    < > = values in this interval not displayed.   GFR: Estimated Creatinine Clearance: 89.6 mL/min  (A) (by C-G formula based on SCr of 0.58 mg/dL (L)). Liver Function Tests: Recent Labs  Lab 04/12/2021 0515 04/01/21 1831  AST 16 9*  ALT 14 11  ALKPHOS 84 79  BILITOT 1.2 2.2*  PROT 8.1 7.6  ALBUMIN 3.1* 2.5*   No results for input(s): LIPASE, AMYLASE in the last 168 hours. Recent Labs  Lab 03/22/2021 1530  AMMONIA 11   Coagulation Profile: Recent Labs  Lab 03/27/2021 0515  INR 1.3*   Cardiac Enzymes: No results for input(s): CKTOTAL, CKMB, CKMBINDEX, TROPONINI in the last 168 hours. BNP (last 3 results) No results for input(s): PROBNP in the last 8760 hours. HbA1C: No results for input(s): HGBA1C in the last 72 hours. CBG: Recent Labs  Lab 04/01/21 0025 04/01/21 0750 04/01/21 1136 04/01/21 1827 04/02/21 0806  GLUCAP 125*  116* 119* 150* 137*   Lipid Profile: Recent Labs    04/02/21 0831  CHOL 86  HDL 28*  LDLCALC 45  TRIG 66  CHOLHDL 3.1   Thyroid Function Tests: No results for input(s): TSH, T4TOTAL, FREET4, T3FREE, THYROIDAB in the last 72 hours. Anemia Panel: No results for input(s): VITAMINB12, FOLATE, FERRITIN, TIBC, IRON, RETICCTPCT in the last 72 hours. Sepsis Labs: Recent Labs  Lab 04/12/2021 0519 04/16/2021 1229 04/01/21 1831  PROCALCITON  --  1.17  --   LATICACIDVEN 1.6  --  0.9    Recent Results (from the past 240 hour(s))  Urine culture     Status: Abnormal   Collection Time: 03/31/2021  3:35 AM   Specimen: In/Out Cath Urine  Result Value Ref Range Status   Specimen Description IN/OUT CATH URINE  Final   Special Requests   Final    NONE Performed at [facility], [address], [place], [place] [address]    Culture MULTIPLE SPECIES PRESENT, SUGGEST RECOLLECTION (A)  Final   Report Status 03/30/2021 FINAL  Final  Resp Panel by RT-PCR (Flu A&B, Covid) Nasopharyngeal Swab     Status: None   Collection Time: 04/09/2021  3:52 AM   Specimen: Nasopharyngeal Swab; Nasopharyngeal(NP) swabs in vial transport medium  Result Value Ref Range  Status   SARS Coronavirus 2 by RT PCR NEGATIVE NEGATIVE Final    Comment: (NOTE) SARS-CoV-2 target nucleic acids are NOT DETECTED.  The SARS-CoV-2 RNA is generally detectable in upper respiratory specimens during the acute phase of infection. The lowest concentration of SARS-CoV-2 viral copies this assay can detect is 138 copies/mL. A negative result does not preclude SARS-Cov-2 infection and should not be used as the sole basis for treatment or other patient management decisions. A negative result may occur with  improper specimen collection/handling, submission of specimen other than nasopharyngeal swab, presence of viral mutation(s) within the areas targeted by this assay, and inadequate number of viral copies(<138 copies/mL). A negative result must be combined with clinical observations, patient history, and epidemiological information. The expected result is Negative.  Fact Sheet for Patients:  EntrepreneurPulse.com.au  Fact Sheet for Healthcare Providers:  IncredibleEmployment.be  This test is no t yet approved or cleared by the [place] FDA and  has been authorized for detection and/or diagnosis of SARS-CoV-2 by FDA under an Emergency Use Authorization (EUA). This EUA will remain  in effect (meaning this test can be used) for the duration of the COVID-19 declaration under Section 564(b)(1) of the Act, 21 U.S.C.section 360bbb-3(b)(1), unless the authorization is terminated  or revoked sooner.       Influenza A by PCR NEGATIVE NEGATIVE Final   Influenza B by PCR NEGATIVE NEGATIVE Final    Comment: (NOTE) The Xpert Xpress SARS-CoV-2/FLU/RSV plus assay is intended as an aid in the diagnosis of influenza from Nasopharyngeal swab specimens and should not be used as a sole basis for treatment. Nasal washings and aspirates are unacceptable for Xpert Xpress SARS-CoV-2/FLU/RSV testing.  Fact Sheet for  Patients: EntrepreneurPulse.com.au  Fact Sheet for Healthcare Providers: IncredibleEmployment.be  This test is not yet approved or cleared by the [place] FDA and has been authorized for detection and/or diagnosis of SARS-CoV-2 by FDA under an Emergency Use Authorization (EUA). This EUA will remain in effect (meaning this test can be used) for the duration of the COVID-19 declaration under Section 564(b)(1) of the Act, 21 U.S.C. section 360bbb-3(b)(1), unless the authorization is terminated or revoked.  Performed  at [facility], [place] [address], [place], [place] [address]   Blood Culture (routine x 2)     Status: Abnormal   Collection Time: 04/13/2021  5:08 AM   Specimen: BLOOD  Result Value Ref Range Status   Specimen Description   Final    BLOOD LEFT ANTECUBITAL Performed at [facility], [address] [place], [place] [address]    Special Requests   Final    BOTTLES DRAWN AEROBIC AND ANAEROBIC Blood Culture adequate volume Performed at [facility], [address] [place], [place] [address]    Culture  Setup Time   Final    GRAM POSITIVE COCCI IN CLUSTERS IN BOTH AEROBIC AND ANAEROBIC BOTTLES CRITICAL RESULT CALLED TO, READ BACK BY AND VERIFIED WITH: V. BRYK,PHARMD [id number] 03/30/2021 T. TYSOR    Culture (A)  Final    STAPHYLOCOCCUS CAPITIS THE SIGNIFICANCE OF ISOLATING THIS ORGANISM FROM A SINGLE SET OF BLOOD CULTURES WHEN MULTIPLE SETS ARE DRAWN IS UNCERTAIN. PLEASE NOTIFY THE MICROBIOLOGY DEPARTMENT WITHIN ONE WEEK IF SPECIATION AND SENSITIVITIES ARE REQUIRED. Performed at [facility], [place] [address], [place], [place] [address]    Report Status 04/01/2021 FINAL  Final  Blood Culture ID Panel (Reflexed)     Status: Abnormal   Collection Time: 03/20/2021  5:08 AM  Result Value Ref Range Status   Enterococcus faecalis NOT DETECTED NOT DETECTED Final   Enterococcus Faecium NOT DETECTED NOT DETECTED  Final   Listeria monocytogenes NOT DETECTED NOT DETECTED Final   Staphylococcus species DETECTED (A) NOT DETECTED Final    Comment: CRITICAL RESULT CALLED TO, READ BACK BY AND VERIFIED WITH: VBeecher Mcardle [id number] 03/30/2021 T. TYSOR    Staphylococcus aureus (BCID) NOT DETECTED NOT DETECTED Final   Staphylococcus epidermidis NOT DETECTED NOT DETECTED Final   Staphylococcus lugdunensis NOT DETECTED NOT DETECTED Final   Streptococcus species NOT DETECTED NOT DETECTED Final   Streptococcus agalactiae NOT DETECTED NOT DETECTED Final   Streptococcus pneumoniae NOT DETECTED NOT DETECTED Final   Streptococcus pyogenes NOT DETECTED NOT DETECTED Final   A.calcoaceticus-baumannii NOT DETECTED NOT DETECTED Final   Bacteroides fragilis NOT DETECTED NOT DETECTED Final   Enterobacterales NOT DETECTED NOT DETECTED Final   Enterobacter cloacae complex NOT DETECTED NOT DETECTED Final   Escherichia coli NOT DETECTED NOT DETECTED Final   Klebsiella aerogenes NOT DETECTED NOT DETECTED Final   Klebsiella oxytoca NOT DETECTED NOT DETECTED Final   Klebsiella pneumoniae NOT DETECTED NOT DETECTED Final   Proteus species NOT DETECTED NOT DETECTED Final   Salmonella species NOT DETECTED NOT DETECTED Final   Serratia marcescens NOT DETECTED NOT DETECTED Final   Haemophilus influenzae NOT DETECTED NOT DETECTED Final   Neisseria meningitidis NOT DETECTED NOT DETECTED Final   Pseudomonas aeruginosa NOT DETECTED NOT DETECTED Final   Stenotrophomonas maltophilia NOT DETECTED NOT DETECTED Final   Candida albicans NOT DETECTED NOT DETECTED Final   Candida auris NOT DETECTED NOT DETECTED Final   Candida glabrata NOT DETECTED NOT DETECTED Final   Candida krusei NOT DETECTED NOT DETECTED Final   Candida parapsilosis NOT DETECTED NOT DETECTED Final   Candida tropicalis NOT DETECTED NOT DETECTED Final   Cryptococcus neoformans/gattii NOT DETECTED NOT DETECTED Final    Comment: Performed at [facility], [address], [place], [place] [address]  Expectorated Sputum Assessment w Gram Stain, Rflx to Resp Cult     Status: None   Collection Time: 04/12/2021  8:31 AM  Specimen: Expectorated Sputum  Result Value Ref Range Status   Specimen Description EXPECTORATED SPUTUM  Final   Special Requests NONE  Final   Sputum evaluation   Final    THIS SPECIMEN IS ACCEPTABLE FOR SPUTUM CULTURE Performed at [facility], [address], [place], [place] [address]    Report Status 03/26/2021 FINAL  Final  MRSA Next Gen by PCR, Nasal     Status: None   Collection Time: 04/12/2021  8:31 AM  Result Value Ref Range Status   MRSA by PCR Next Gen NOT DETECTED NOT DETECTED Final    Comment: (NOTE) The GeneXpert MRSA Assay (FDA approved for NASAL specimens only), is one component of a comprehensive MRSA colonization surveillance program. It is not intended to diagnose MRSA infection nor to guide or monitor treatment for MRSA infections. Test performance is not FDA approved in patients less than 67 years old. Performed at [facility], [place] [address], [place], [place] [address]   Culture, Respiratory w Gram Stain     Status: None   Collection Time: 04/12/2021  8:31 AM  Result Value Ref Range Status   Specimen Description EXPECTORATED SPUTUM  Final   Special Requests NONE Reflexed from [id number]  Final   Gram Stain   Final    ABUNDANT WBC PRESENT, PREDOMINANTLY PMN RARE GRAM POSITIVE COCCI Performed at [facility], [place] [address], [place], [place] [address]    Culture   Final    RARE STAPHYLOCOCCUS AUREUS RARE PROTEUS MIRABILIS    Report Status 04/01/2021 FINAL  Final   Organism ID, Bacteria STAPHYLOCOCCUS AUREUS  Final   Organism ID, Bacteria PROTEUS MIRABILIS  Final      Susceptibility   Proteus mirabilis - MIC*    AMPICILLIN <=2 SENSITIVE Sensitive     CEFAZOLIN <=4 SENSITIVE Sensitive     CEFEPIME <=0.12 SENSITIVE Sensitive     CEFTAZIDIME <=1 SENSITIVE Sensitive     CEFTRIAXONE <=0.25  SENSITIVE Sensitive     CIPROFLOXACIN <=0.25 SENSITIVE Sensitive     GENTAMICIN <=1 SENSITIVE Sensitive     IMIPENEM 8 INTERMEDIATE Intermediate     TRIMETH/SULFA <=20 SENSITIVE Sensitive     AMPICILLIN/SULBACTAM <=2 SENSITIVE Sensitive     PIP/TAZO <=4 SENSITIVE Sensitive     * RARE PROTEUS MIRABILIS   Staphylococcus aureus - MIC*    CIPROFLOXACIN <=0.5 SENSITIVE Sensitive     ERYTHROMYCIN <=0.25 SENSITIVE Sensitive     GENTAMICIN <=0.5 SENSITIVE Sensitive     OXACILLIN 0.5 SENSITIVE Sensitive     TETRACYCLINE >=16 RESISTANT Resistant     VANCOMYCIN 1 SENSITIVE Sensitive     TRIMETH/SULFA <=10 SENSITIVE Sensitive     CLINDAMYCIN <=0.25 SENSITIVE Sensitive     RIFAMPIN <=0.5 SENSITIVE Sensitive     Inducible Clindamycin NEGATIVE Sensitive     * RARE STAPHYLOCOCCUS AUREUS  Blood Culture (routine x 2)     Status: None (Preliminary result)   Collection Time: 04/05/2021  9:38 AM   Specimen: BLOOD RIGHT HAND  Result Value Ref Range Status   Specimen Description BLOOD RIGHT HAND  Final   Special Requests   Final    BOTTLES DRAWN AEROBIC AND ANAEROBIC Blood Culture adequate volume   Culture   Final    NO GROWTH 4 DAYS Performed at [facility], [address] [place], [place] [address]    Report Status PENDING  Incomplete      Imaging Studies   CT ANGIO HEAD NECK  W WO CM  Result Date: 04/02/2021 CLINICAL DATA:  Punctate superior left parietal infarction shown by MRI. Clinical concern for retinal artery occlusion. EXAM: CT ANGIOGRAPHY HEAD AND NECK TECHNIQUE: Multidetector CT imaging of the head and neck was performed using the standard protocol during bolus administration of intravenous contrast. Multiplanar CT image reconstructions and MIPs were obtained to evaluate the vascular anatomy. Carotid stenosis measurements (when applicable) are obtained utilizing NASCET criteria, using the distal internal carotid diameter as the denominator. CONTRAST:  89m OMNIPAQUE IOHEXOL 350 MG/ML  SOLN COMPARISON:  MRI same day FINDINGS: CT HEAD FINDINGS Brain: Brain shows mild age related volume loss without evidence of old or acute small or large vessel infarction by CT. No mass, hemorrhage, hydrocephalus or extra-axial collection. Vascular: There is atherosclerotic calcification of the major vessels at the base of the brain. Skull: Negative Sinuses: Clear/normal Orbits: None Review of the MIP images confirms the above findings CTA NECK FINDINGS Aortic arch: Aortic atherosclerosis. Innominate artery origin not included in the region studied. No origin stenosis of the left common carotid artery or left subclavian artery. Right carotid system: Common carotid artery widely patent to the bifurcation. Carotid bifurcation is normal without soft or calcified plaque. Cervical ICA is widely patent. Left carotid system: Common carotid artery widely patent to bifurcation. Minimal soft plaque at the bifurcation but no stenosis. Cervical ICA widely patent to the skull base. Vertebral arteries: Both vertebral artery origins are widely patent. Both vertebral arteries appear normal through the cervical region to the foramen magnum. 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 Otherwise unremarkable. Other neck: No mass or lymphadenopathy.  Tracheostomy in place. Upper chest: Left effusion with consolidation of the left upper lobe. Review of the MIP images confirms the above findings CTA HEAD FINDINGS Anterior circulation: Both internal carotid arteries are patent through the skull base and siphon regions. Mild siphon atherosclerotic calcification but without stenosis greater than 30%. The anterior and middle cerebral vessels are patent, but are small and somewhat irregular. No large or medium vessel occlusion. There is stenosis both supraclinoid internal carotid arteries estimated at 50% on the right and 30% on the left. Posterior circulation: Both vertebral arteries are patent through the foramen magnum. There is  atherosclerotic calcification scratched at there is atherosclerotic disease of both vertebral artery V4 segments but both do reach the basilar. The basilar is a small irregular vessel does show flow. Superior cerebellar and posterior cerebral arteries show flow, but are small vessels. Venous sinuses: Patent and normal. Anatomic variants: None significant. Review of the MIP images confirms the above findings IMPRESSION: No carotid bifurcation stenosis.  Minimal atherosclerotic plaque. Intracranial vascular disease with diffuse narrowing of the intracranial branch vessels most consistent with atherosclerosis. Diffuse vaso spasm could be considered, but there does not appear to be an etiology for that. 50% stenosis of the supraclinoid ICA on the right and 30% stenosis of the supraclinoid ICA on left. Electronically Signed   By: MNelson ChimesM.D.   On: 04/02/2021 12:58   DG Skull 1-3 Views  Result Date: 04/02/2021 CLINICAL DATA:  Pre MRI EXAM: SKULL - 1-3 VIEW COMPARISON:  None. FINDINGS: There is no evidence of metallic foreign body within the orbits. No significant bone abnormality identified. IMPRESSION: No evidence of metallic foreign body within the orbits. Electronically Signed   By: KRolm BaptiseM.D.   On: 04/02/2021 00:05   DG Pelvis 1-2 Views  Result Date: 04/02/2021 CLINICAL DATA:  Pre MRI EXAM: PELVIS - 1-2 VIEW COMPARISON:  None. FINDINGS: Skin staples are noted in the left inguinal region. There is a metallic battery like foreign body seen in the right lower quadrant overlying the iliac crest of unknown etiology or location. Recommend clinical correlation. IMPRESSION: Battery like foreign body projects over the right iliac crest of unknown etiology or exact location. Left inguinal skin staples. Electronically Signed   By: Rolm Baptise M.D.   On: 04/02/2021 00:06   DG Abd 1 View  Result Date: 04/02/2021 CLINICAL DATA:  MRI screening EXAM: ABDOMEN - 1 VIEW COMPARISON:  12/06/2020 FINDINGS:  Electronic device projects in the right lower quadrant. Unclear whether this is internal or external. This was present on 12/06/2020, but in a different location. IMPRESSION: Electronic device in the right lower quadrant, indeterminate for MRI. Unclear if this device is internal or external. Electronically Signed   By: Ulyses Jarred M.D.   On: 04/02/2021 00:11   CT ABDOMEN W CONTRAST  Result Date: 04/02/2021 CLINICAL DATA:  Stroke, preop planning for gastrostomy catheter EXAM: CT ABDOMEN WITH CONTRAST TECHNIQUE: Multidetector CT imaging of the abdomen was performed using the standard protocol following bolus administration of intravenous contrast. CONTRAST:  31m OMNIPAQUE IOHEXOL 350 MG/ML SOLN COMPARISON:  CT chest 04/13/2021 and previous FINDINGS: Lower chest: Small left pleural effusion as before. Fairly dense consolidation in most of the visualized left lung base, and worsening airspace consolidation posteriorly in the visualized right lower lobe. Hepatobiliary: Scattered subcentimeter low-attenuation lesions statistically most likely cysts in the absence of a history of primary carcinoma, but too small to accurately characterize. Gallbladder is nondistended. No biliary ductal dilatation. Pancreas: Unremarkable. No pancreatic ductal dilatation or surrounding inflammatory changes. Spleen: Normal in size without focal abnormality. Adrenals/Urinary Tract: Adrenal glands unremarkable. No hydronephrosis. Stomach/Bowel: Stomach is incompletely distended. There is safe percutaneous window for gastrostomy tube placement. Visualized small bowel and colon are nondilated, unremarkable. Vascular/Lymphatic: Aortic Atherosclerosis (ICD10-170.0) without aneurysm or stenosis. No abdominal or mesenteric adenopathy. Other: No ascites.  No free air. Musculoskeletal: No acute or significant osseous findings. IMPRESSION: 1. No evident anatomic contraindications to percutaneous gastrostomy placement. 2. Worsening bibasilar  pulmonary airspace disease with persistent small left effusion. Electronically Signed   By: DLucrezia EuropeM.D.   On: 04/02/2021 13:08   MR BRAIN WO CONTRAST  Result Date: 04/02/2021 CLINICAL DATA:  Neuro deficit, acute, stroke suspected Concern for retinal artery occlusion EXAM: MRI HEAD WITHOUT CONTRAST TECHNIQUE: Multiplanar, multiecho pulse sequences of the brain and surrounding structures were obtained without intravenous contrast. COMPARISON:  07/28/2018 FINDINGS: Brain: Punctate focus of abnormal diffusion restriction in the superior left parietal lobe. No acute or chronic hemorrhage. There is multifocal hyperintense T2-weighted signal within the white matter. Parenchymal volume and CSF spaces are normal. The midline structures are normal. Vascular: Major flow voids are preserved. Skull and upper cervical spine: Normal calvarium and skull base. Visualized upper cervical spine and soft tissues are normal. Sinuses/Orbits:No paranasal sinus fluid levels or advanced mucosal thickening. No mastoid or middle ear effusion. Normal orbits. IMPRESSION: 1. Punctate focus of acute ischemia in the superior left parietal lobe. No hemorrhage or mass effect. 2. Findings of chronic microvascular ischemia. Electronically Signed   By: KUlyses JarredM.D.   On: 04/02/2021 01:09   DG CHEST PORT 1 VIEW  Result Date: 04/02/2021 CLINICAL DATA:  Left hemithorax opacification follow-up EXAM: PORTABLE CHEST 1 VIEW COMPARISON:  04/02/2021 FINDINGS: Tracheostomy tube is noted in satisfactory position. Patchy infiltrate is noted throughout the left lung as well as the right lung  base. Following bronchoscopy significant improved aeration in the left lung is noted. No sizable effusion is seen. IMPRESSION: Bilateral infiltrates left greater than right with significant improved aeration in the left lung following bronchoscopy and suction. Electronically Signed   By: Inez Catalina M.D.   On: 04/02/2021 15:39   DG CHEST PORT 1  VIEW  Result Date: 04/02/2021 CLINICAL DATA:  Hypoxia EXAM: PORTABLE CHEST 1 VIEW COMPARISON:  04/15/2021 FINDINGS: Cardiac shadow is stable in appearance. Tracheostomy tube is noted. Right lung demonstrates mild basilar atelectasis. Left lung demonstrates increasing consolidation and pleural effusion with only minimal residual aerated lung. No acute bony abnormality is noted. IMPRESSION: Significant increase in consolidation and opacification within the left hemithorax consistent with worsening infiltrate and likely underlying effusion. Electronically Signed   By: Inez Catalina M.D.   On: 04/02/2021 08:10     Medications   Scheduled Meds:  aspirin  81 mg Oral Daily   chlorhexidine gluconate (MEDLINE KIT)  15 mL Mouth Rinse BID   Chlorhexidine Gluconate Cloth  6 each Topical Daily   [START ON 04/03/2021] enoxaparin (LOVENOX) injection  40 mg Subcutaneous I94W   folic acid  1 mg Intravenous Daily   ipratropium-albuterol  3 mL Nebulization TID   latanoprost  1 drop Both Eyes QHS   lidocaine (PF)       LORazepam  0.5 mg Oral QHS   mouth rinse  15 mL Mouth Rinse 10 times per day   metoprolol tartrate  2.5 mg Intravenous Q8H   PARoxetine  20 mg Oral q morning   polyvinyl alcohol  1 drop Both Eyes TID   senna-docusate  1 tablet Oral QHS   sodium bicarbonate  1,300 mg Per Tube TID   Continuous Infusions:  0.9 % NaCl with KCl 20 mEq / L Stopped (04/02/21 1147)   ampicillin-sulbactam (UNASYN) IV 3 g (04/02/21 1233)   ceFAZolin 2 g (04/02/21 1540)   famotidine (PEPCID) IV Stopped (04/02/21 1039)       LOS: 4 days    Time spent: 30 minutes with greater than 50% spent at bedside and in coordination of care.    Ezekiel Slocumb, DO [facility]  04/02/2021, 4:19 PM      If 7PM-7AM, please contact night-coverage. How to contact the [facility] Attending or Consulting provider [place] or covering provider during after hours [place], for this patient?    Check the care team in [facility] and  look for a) attending/consulting TR[facility] provider listed and b) the [facility] team listed Log into [email or web address] and use [place]'s universal password to access. If you do not have the password, please contact the hospital operator. Locate the [facility] provider you are looking for under [facility] and page to a number that you can be directly reached. If you still have difficulty reaching the provider, please page the [facility] (Director on Call) for the Hospitalists listed on amion for assistance.

## 2021-04-02 NOTE — Procedures (Signed)
Bronchoscopy Procedure Note  Wayne Young  [id number]  11-25-1955  Date:04/02/21  Time:2:13 PM   Provider Performing:Maria Coin   Procedure(s):  Flexible Bronchoscopy [id number]) and Initial Therapeutic Aspiration of Tracheobronchial Tree (62831)  Indication(s) Collapse left lung  Consent Risks of the procedure as well as the alternatives and risks of each were explained to the patient and/or caregiver.  Consent for the procedure was obtained and is signed in the bedside chart  Anesthesia Midazolam 65m. Fentanyl 557m  Time Out Verified patient identification, verified procedure, site/side was marked, verified correct patient position, special equipment/implants available, medications/allergies/relevant history reviewed, required imaging and test results available.   Sterile Technique Usual hand hygiene, masks, gowns, and gloves were used   Procedure Description Bronchoscope advanced through tracheostomy tube and into airway.  Airways were examined down to subsegmental level with findings noted below.   Following diagnostic evaluation, Therapeutic aspiration performed in obstructed left mainstem bronchus and throughout left lung  Findings: white-tan secretions left lung with some spill over to right. Easy to suction.    Complications/Tolerance None; patient tolerated the procedure well. Chest X-ray is needed post procedure.   EBL none   Specimen(s) Tracheal aspirate sent.  RaKipp BroodMD FREncompass Health Rehabilitation HospitalCU Physician CHBremenPager: [phone] Epic Secure Chat After hours: [phone].  04/02/2021, 2:16 PM

## 2021-04-02 NOTE — Progress Notes (Signed)
Metaneb not done at this time due to peep.  Peep will wean back to 5 at 8pm tonight. Metaneb will start at that time.

## 2021-04-02 NOTE — Progress Notes (Signed)
*  PRELIMINARY RESULTS* Echocardiogram 2D Echocardiogram has been performed.  Neomia Dear RDCS 04/02/2021, 2:45 PM

## 2021-04-02 NOTE — Progress Notes (Signed)
STROKE TEAM PROGRESS NOTE   SUBJECTIVE (INTERVAL HISTORY) His RN is at the bedside.  Overall his condition is unchanged. Pt lying in bed, on trach and vent, lethargic. Not quite following central commands, but able to raise eyebrows with voice stimulation. 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 MRI showed left ACA territory punctate infarct which likely incidental finding.    OBJECTIVE Temp:  [98.1 F (36.7 C)-98.7 F (37.1 C)] 98.1 F (36.7 C) (06/15 0804) Pulse Rate:  [99-125] 113 (06/15 1100) Cardiac Rhythm: Sinus tachycardia (06/15 0800) Resp:  [14-20] 16 (06/15 1100) BP: (143-178)/(70-93) 151/74 (06/15 1000) SpO2:  [92 %-100 %] 92 % (06/15 1143) FiO2 (%):  [40 %] 40 % (06/15 1143)  Recent Labs  Lab 04/01/21 0025 04/01/21 0750 04/01/21 1136 04/01/21 1827 04/02/21 0806  GLUCAP 125* 116* 119* 150* 137*   Recent Labs  Lab 04/06/2021 0938 03/31/2021 1038 03/30/21 0031 03/31/21 0350 04/01/21 0114 04/01/21 1814 04/01/21 1816 04/01/21 1831  NA  --    < > 139 139 139 145 147* 142  K  --    < > 3.5 2.8* 3.4* 4.1 3.9 4.0  CL  --   --  106 106 109 112*  --  112*  CO2  --   --  22 17* 16* 16*  --  14*  GLUCOSE  --   --  96 129* 128* 127*  --  134*  BUN  --   --  --  10  CREATININE  --   --  <0.30* 0.53* 0.55* 0.57*  --  0.58*  CALCIUM  --   --  9.2 9.2 9.4 10.0  --  9.6  MG 1.9  --   --  2.1  --   --   --   --    < > = values in this interval not displayed.   Recent Labs  Lab 04/07/2021 0515 04/01/21 1831  AST 16 9*  ALT 14 11  ALKPHOS 84 79  BILITOT 1.2 2.2*  PROT 8.1 7.6  ALBUMIN 3.1* 2.5*   Recent Labs  Lab 04/10/2021 0515 03/28/2021 1038 03/30/21 0031 03/31/21 0350 04/01/21 1816 04/01/21 1831 04/02/21 0153  WBC 17.0*  --  16.8* 12.1*  --  12.3* 12.7*  NEUTROABS 14.8*  --   --   --   --   --   --   HGB 14.3   < > 14.4 13.0 13.9 13.3 12.8*  HCT 45.5   < > 46.5 41.5  41.0 43.7 40.6  MCV 82.3  --  83.2 84.5  --  85.9 83.7  PLT 278  --  303 255  --  279 235   < > = values in this interval not displayed.   No results for input(s): CKTOTAL, CKMB, CKMBINDEX, TROPONINI in the last 168 hours. No results for input(s): LABPROT, INR in the last 72 hours. No results for input(s): COLORURINE, LABSPEC, PHURINE, GLUCOSEU, HGBUR, BILIRUBINUR, KETONESUR, PROTEINUR, UROBILINOGEN, NITRITE, LEUKOCYTESUR in the last 72 hours.  Invalid input(s): APPERANCEUR     Component Value Date/Time   CHOL 86 04/02/2021 0831   TRIG 66 04/02/2021 0831   HDL 28 (L) 04/02/2021 0831   CHOLHDL 3.1 04/02/2021 0831   VLDL 13 04/02/2021 0831   LDLCALC 45 04/02/2021 0831   Lab Results  Component Value Date   HGBA1C 6.7 (H)  04/02/2021      Component Value Date/Time   LABOPIA NONE DETECTED 07/26/2018 1906   COCAINSCRNUR NONE DETECTED 07/26/2018 1906   LABBENZ NONE DETECTED 07/26/2018 1906   AMPHETMU NONE DETECTED 07/26/2018 1906   THCU NONE DETECTED 07/26/2018 1906   LABBARB NONE DETECTED 07/26/2018 1906    No results for input(s): ETH in the last 168 hours.  I have personally reviewed the radiological images below and agree with the radiology interpretations.  DG Skull 1-3 Views  Result Date: 04/02/2021 CLINICAL DATA:  Pre MRI EXAM: SKULL - 1-3 VIEW COMPARISON:  None. FINDINGS: There is no evidence of metallic foreign body within the orbits. No significant bone abnormality identified. IMPRESSION: No evidence of metallic foreign body within the orbits. Electronically Signed   By: Charlett Nose M.D.   On: 04/02/2021 00:05   DG Pelvis 1-2 Views  Result Date: 04/02/2021 CLINICAL DATA:  Pre MRI EXAM: PELVIS - 1-2 VIEW COMPARISON:  None. FINDINGS: Skin staples are noted in the left inguinal region. There is a metallic battery like foreign body seen in the right lower quadrant overlying the iliac crest of unknown etiology or location. Recommend clinical correlation. IMPRESSION: Battery  like foreign body projects over the right iliac crest of unknown etiology or exact location. Left inguinal skin staples. Electronically Signed   By: Charlett Nose M.D.   On: 04/02/2021 00:06   DG Abd 1 View  Result Date: 04/02/2021 CLINICAL DATA:  MRI screening EXAM: ABDOMEN - 1 VIEW COMPARISON:  12/06/2020 FINDINGS: Electronic device projects in the right lower quadrant. Unclear whether this is internal or external. This was present on 12/06/2020, but in a different location. IMPRESSION: Electronic device in the right lower quadrant, indeterminate for MRI. Unclear if this device is internal or external. Electronically Signed   By: Deatra Robinson M.D.   On: 04/02/2021 00:11   CT CHEST WO CONTRAST  Result Date: 2021-04-02 CLINICAL DATA:  Pneumonia with effusion or abscess suspected. New hypoxia. EXAM: CT CHEST WITHOUT CONTRAST TECHNIQUE: Multidetector CT imaging of the chest was performed following the standard protocol without IV contrast. COMPARISON:  Radiograph from earlier today.  Chest CT 07/27/2018 FINDINGS: Cardiovascular: Normal heart size. No pericardial effusion. Mild atheromatous calcification of the aorta and coronaries. Mediastinum/Nodes: Generous sized lymph nodes which may be reactive in this setting. Chronic calcified mediastinal nodes from remote granulomatous disease. Lungs/Pleura: Tracheostomy tube in place. Central airway debris at both mainstem bronchi with layering. Extensive consolidative opacity with some volume loss on the left lung where there is a small parapneumonic effusion. No cavitary changes. More atelectatic type opacity at the right base. No edema. No air leak. Upper Abdomen: Negative Musculoskeletal: No acute finding IMPRESSION: 1. Extensive pneumonia on the left. Recommend follow-up to clearing. 2. Extensive airway debris narrowing bilateral mainstem bronchi. 3. Small pleural effusions. Electronically Signed   By: Marnee Spring M.D.   On: 04/02/21 11:14   MR BRAIN WO  CONTRAST  Result Date: 04/02/2021 CLINICAL DATA:  Neuro deficit, acute, stroke suspected Concern for retinal artery occlusion EXAM: MRI HEAD WITHOUT CONTRAST TECHNIQUE: Multiplanar, multiecho pulse sequences of the brain and surrounding structures were obtained without intravenous contrast. COMPARISON:  07/28/2018 FINDINGS: Brain: Punctate focus of abnormal diffusion restriction in the superior left parietal lobe. No acute or chronic hemorrhage. There is multifocal hyperintense T2-weighted signal within the white matter. Parenchymal volume and CSF spaces are normal. The midline structures are normal. Vascular: Major flow voids are preserved. Skull and upper cervical spine: Normal  calvarium and skull base. Visualized upper cervical spine and soft tissues are normal. Sinuses/Orbits:No paranasal sinus fluid levels or advanced mucosal thickening. No mastoid or middle ear effusion. Normal orbits. IMPRESSION: 1. Punctate focus of acute ischemia in the superior left parietal lobe. No hemorrhage or mass effect. 2. Findings of chronic microvascular ischemia. Electronically Signed   By: Deatra Robinson M.D.   On: 04/02/2021 01:09   DG CHEST PORT 1 VIEW  Result Date: 04/02/2021 CLINICAL DATA:  Hypoxia EXAM: PORTABLE CHEST 1 VIEW COMPARISON:  04/14/2021 FINDINGS: Cardiac shadow is stable in appearance. Tracheostomy tube is noted. Right lung demonstrates mild basilar atelectasis. Left lung demonstrates increasing consolidation and pleural effusion with only minimal residual aerated lung. No acute bony abnormality is noted. IMPRESSION: Significant increase in consolidation and opacification within the left hemithorax consistent with worsening infiltrate and likely underlying effusion. Electronically Signed   By: Alcide Clever M.D.   On: 04/02/2021 08:10   DG Chest Port 1 View  Result Date: 04/08/2021 CLINICAL DATA:  Concern for sepsis EXAM: PORTABLE CHEST 1 VIEW COMPARISON:  Radiograph 08/20/2018, CT 07/27/2018 FINDINGS:  Confluent masslike opacities present in the left mid lung and retrocardiac space. No pneumothorax or visible effusion. Asymmetrically diminished left lung volumes are similar to the recent comparison radiography albeit with a more normal appearance on the somewhat more remote CT comparison. Tracheostomy tube in place. Cardiomediastinal contours are stable from prior with a calcified aorta. Degenerative changes are present in the imaged spine and shoulders. No acute osseous or soft tissue abnormality. IMPRESSION: Confluent masslike opacities present in the left mid lung. While this may be infectious/pneumonia in the setting of suspected sepsis, underlying mass is not fully excluded. Could consider short-term (6 week) repeat imaging following therapeutic intervention. Otherwise, consider CT imaging for further characterization. Electronically Signed   By: Kreg Shropshire M.D.   On: 04/01/2021 04:24     PHYSICAL EXAM  Temp:  [98.1 F (36.7 C)-98.7 F (37.1 C)] 98.1 F (36.7 C) (06/15 0804) Pulse Rate:  [99-125] 113 (06/15 1100) Resp:  [14-20] 16 (06/15 1100) BP: (143-178)/(70-93) 151/74 (06/15 1000) SpO2:  [92 %-100 %] 92 % (06/15 1143) FiO2 (%):  [40 %] 40 % (06/15 1143)  General - malnourished, well developed, lethargic  Ophthalmologic - fundi not visualized due to noncooperation.  Cardiovascular - Regular rhythm and rate.  Neuro - awake, eyes open, able to raise eyebrows bilaterally on voice stimulation but not following other central commands. Nonverbal on trach. No gaze palsy, but not tracking bilaterally, not blinking to visual threat bilaterally, right eye irregular shape at 4 o'clock position, pupil 47mm sluggish to prolonged light exposure. However, consensual pupillary reflex intact on the right with pupil 16mm->2mm. Left pupil direct on consensual reflex intact. Consistent with right RAPD. No significant facial droop but bilateral facial weakness. Tongue protrusion not cooperative. No  movement of all extremities, muscle bulk decreased throughout. DTR diminished, no barbinski. No muscle fasciculations seen.  Sensation, coordination and gait not tested.   ASSESSMENT/PLAN Mr. Wayne Young is a 65 y.o. male with history of DM, HTN, ALS with quadriplegia and trach on vent admitted for PEG placement and pneumonia. Code stroke called with findings of right eye RAPD. However, called wife today and confirmed known right eye blindness. MRI however showed punctate left ACA infarct, incidental finding.   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 4-5 years ago, pt had right eye lens replacement and after that pt was able to see some shadows intermittently.  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 CTA head and neck 50% stenosis of the supraclinoid ICA on the right and 30% stenosis of the supraclinoid ICA on left. 2D Echo  pending LDL 45 HgbA1c 6.7 lovenox for VTE prophylaxis No antithrombotic prior to admission, now on aspirin 81 mg daily. Continue on discharge. 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 Please call with questions. Pt will follow up with neurology at [facility]. Thanks for the consult.   Marvel Plan, MD PhD Stroke Neurology 04/02/2021 12:54 PM    To contact Stroke Continuity provider, please refer to [email or web address]. After hours, contact General Neurology

## 2021-04-02 NOTE — Hospital Course (Signed)
Wayne Young is a 65 year old male with rheumatoid arthritis, ALS with a trach on home vent, diabetes mellitus, hypertension. Presented due to oxygen desaturations into the 80s after nebulizer treatments.  In the ED, patient was febrile with temp 101 F, tachypneic and mildly tachycardic, with leukocytosis on CBC and  hypokalemia.  Anasarca noted on exam.  CT scan of the chest showed extensive left lung opacities and debris in the mainstem bronchus.    Started on vancomycin and cefepime for sepsis secondary to pneumonia.  Admitted to [facility] service.    Pulmonology consulted and following.

## 2021-04-02 NOTE — Progress Notes (Signed)
NAME:  Wayne Young, MRN:  [id number], DOB:  1956-08-17, LOS: 4 ADMISSION DATE:  03/24/2021, CONSULTATION DATE: 03/28/2021 REFERRING MD:  Dr. Jacqulyn Bath, CHIEF COMPLAINT: Hypoxic respiratory failure  History of Present Illness:  HPI obtained from medical chart review as patient is mechanically ventilated via trach.  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  Patient has home health nurse who noted increased work of breathing and hypoxemia which began overnight.  Has been having increased secretions since Wednesday.  She had been suctioning them, was suctioned without secretions and given breathing treatment with minimal increase in O2.   In fact after the most recent suctioning home nurse noted that his oxygen saturations went down significantly and thus called EMS.  On EMS arrival, O2 sats in the 80s which increased to the 90s on 6 L from usually 5 L bled it.  Home health nurse also reported improving lower leg edema.  This is been improving over the last few months.  In ER, noted to be febrile at 101, minimally tachycardic at 106 and normotensive.  Labs significant for WBC 17, K 2.8, low BUN 7 and sCr 0.32, albumin 3.1, normal lactate, INR 1.3, SARS/ flu neg, UA cloudy with large leukocytes and pyuria.  CXR noted for confluent masslike opacities in the left mid lung, most suspicious for pneumonia, but underlying mass not excluded.  Blood and urine cultures sent and empirically started on vancomycin and cefepime.  TRH to admit, PCCM consulted for chronic vent management.    Pertinent  Medical History  ALS w/trach on home vent, HTN, DM, arthritis, glaucoma  Significant Hospital Events: Including procedures, antibiotic start and stop dates in addition to other pertinent events   Admit to [facility] with sepsis, pneumonia/ UTI  Bcx2 6/11  >> GPC's in clusters in 1/2 sets >>  Ucx 6/11 >> recollection suggested, multiple species  Tracheal aspirate 6/11 >> proteus mirabilis, rare staph >>  Vanc 6/11 >> 6/11 Cefepime 6/11 >>6/14 Unasyn 6/14 >>  6/14 code stroke called and then cancelled for new pupil size discrepancy L>R and L pupil with new nonreactivity to light, evaluated by neuro and received brain MRI which showed punctate area of ischemia in superior left parietal lobe, not candidate for tPA, pt at baseline mental status 6/14 palliative care following, code status discussions  Interim History / Subjective:  Evaluated by neuro yesterday due to new pupil discrepancies, pt continues to at baseline mental status, per nursing.  Stable vent settings, PEEP 5, FiO2 40%.  Afebrile overnight.    Objective   Blood pressure (!) 167/86, pulse (!) 108, temperature 98.1 F (36.7 C), temperature source Oral, resp. rate 16, height 5\' 9"  (1.753 m), weight 79.7 kg, SpO2 99 %.    Vent Mode: PRVC FiO2 (%):  [40 %] 40 % Set Rate:  [16 bmp] 16 bmp Vt Set:  [560 mL] 560 mL PEEP:  [5 cmH20] 5 cmH20 Plateau Pressure:  [17 cmH20-19 cmH20] 17 cmH20   Intake/Output Summary (Last 24 hours) at 04/02/2021 0812 Last data filed at 04/02/2021 0400 Gross per 24 hour  Intake 1918.1 ml  Output 1950 ml  Net -31.9 ml    Filed Weights   04/07/2021 0323 03/30/21 0600  Weight: 68 kg 79.7 kg    Examination: General: chronically ill appearing adult male lying in bed in NAD  HEENT: MM  pink/moist, trach midline c/d/I, anicteric Neuro: Awakens to voice, not following commands this morning, extremities x4 with changes consistent with chronic neuromuscular disease, L pupil 4 mm, nonreactive to light, R pupil 3 mm and round, reactive to light CV: tachycardic (110s), no m/r/g PULM: non-labored on vent, diffuse rhonchi GI: soft, non-distended, normoactive bowel sounds Extremities: warm/dry, trace to 1+ dependent edema  Skin: no rashes or lesions on exposed  skin    Labs/imaging that I havepersonally reviewed  (right click and "Reselect all SmartList Selections" daily)  6/14: CMP, CBC, ABG, MRI brain, LA  6/15: CXR, CBC, lipid panel   Resolved Hospital Problem list    Assessment & Plan:   Acute on chronic hypoxic respiratory failure 2/2 ALS & Left PNA  Sepsis  On home mini vent with [employer] (Trilogy label on bottom).  Using AVAPS mode.  PEEP 6, respiratory rate 20, PIP 20 exhaled tidal volume with this around 300-minute ventilation around 5.    -continue current vent settings PRVC 8cc/kg 560 Vt, R16, PEEP 5, FiO2 40% -will investigate AVAPS mode at home and home vent limitations, ? If he will need a Trilogy vent  -VAP prevention measures -L airspace disease somewhat rounded on CXR, will need outpatient CT post treatment of PNA to ensure clearance and no underlying mass, although will plan for bronch due to worsening infiltrate vs pleural effusion on CXR this AM -was switched from vanc/cefepime to Unasyn on 6/14, will continue to follow cultures, duration per primary -would transition to home vent for 24 -48 hours prior to discharge and assess ABG to ensure adequate settings -H2 blocker while on vent -pulmonary hygiene -chest PT  -continue Duoneb TID, xopenex Q6  -trach care per protocol  -diet per SLP recommendations > note last testing with aspiration but was given a diet to aspirate "less".  He is not eating with the new diet.  Consider TF or palliative and let him eat as he desires.  -palliative care following -neuro following   Remainder per primary team.  PCCM will follow up 6/17, if not sooner for possible bronchoscopy   Best practice (right click and "Reselect all SmartList Selections" daily)  Diet:  Oral, per SLP recommendations Pain/Anxiety/Delirium protocol (if indicated): No VAP protocol (if indicated): Yes DVT prophylaxis: LMWH GI prophylaxis: H2B Glucose control:  SSI No Central venous access:   N/A Arterial line:  N/A Foley:  N/A Mobility:  bed rest  PT consulted: N/A Last date of multidisciplinary goals of care discussion [per primary] Code Status: DNR Disposition: per [facility]   Critical care time: n/a    Park Liter, MS4

## 2021-04-02 NOTE — Procedures (Signed)
Interventional Radiology Procedure Note  Procedure: G tube placement  Indication: Dysphagia  Findings: Please refer to procedural dictation for full description.  Complications: None  EBL: < 10 mL  Lawana Hartzell, MD [phone]   

## 2021-04-02 NOTE — Progress Notes (Signed)
Patient transported from IR procedure back to 2H23 without complication.

## 2021-04-03 DIAGNOSIS — G1221 Amyotrophic lateral sclerosis: Secondary | ICD-10-CM | POA: Diagnosis not present

## 2021-04-03 DIAGNOSIS — Z7189 Other specified counseling: Secondary | ICD-10-CM | POA: Diagnosis not present

## 2021-04-03 DIAGNOSIS — J189 Pneumonia, unspecified organism: Secondary | ICD-10-CM | POA: Diagnosis not present

## 2021-04-03 DIAGNOSIS — J9621 Acute and chronic respiratory failure with hypoxia: Secondary | ICD-10-CM | POA: Diagnosis not present

## 2021-04-03 LAB — MAGNESIUM
Magnesium: 2 mg/dL (ref 1.7–2.4)
Magnesium: 1.9 mg/dL (ref 1.7–2.4)
Magnesium: 2 mg/dL (ref 1.7–2.4)

## 2021-04-03 LAB — GLUCOSE, CAPILLARY
Glucose-Capillary: 151 mg/dL — ABNORMAL HIGH (ref 70–99)
Glucose-Capillary: 160 mg/dL — ABNORMAL HIGH (ref 70–99)
Glucose-Capillary: 164 mg/dL — ABNORMAL HIGH (ref 70–99)
Glucose-Capillary: 168 mg/dL — ABNORMAL HIGH (ref 70–99)
Glucose-Capillary: 291 mg/dL — ABNORMAL HIGH (ref 70–99)

## 2021-04-03 LAB — BASIC METABOLIC PANEL
Anion gap: 13 (ref 5–15)
BUN: 5 mg/dL — ABNORMAL LOW (ref 8–23)
CO2: 16 mmol/L — ABNORMAL LOW (ref 22–32)
Calcium: 9.7 mg/dL (ref 8.9–10.3)
Chloride: 118 mmol/L — ABNORMAL HIGH (ref 98–111)
Creatinine, Ser: 0.61 mg/dL (ref 0.61–1.24)
GFR, Estimated: >60 mL/min (ref >60)
Glucose, Bld: 164 mg/dL — ABNORMAL HIGH (ref 70–99)
Potassium: 3.3 mmol/L — ABNORMAL LOW (ref 3.5–5.1)
Sodium: 147 mmol/L — ABNORMAL HIGH (ref 135–145)

## 2021-04-03 LAB — CBC
HCT: 44.4 % (ref 39.0–52.0)
Hemoglobin: 13.9 g/dL (ref 13.0–17.0)
MCH: 26.2 pg (ref 26.0–34.0)
MCHC: 31.3 g/dL (ref 30.0–36.0)
MCV: 83.8 fL (ref 80.0–100.0)
Platelets: 238 10*3/uL (ref 150–400)
RBC: 5.3 MIL/uL (ref 4.22–5.81)
RDW: 18.3 % — ABNORMAL HIGH (ref 11.5–15.5)
WBC: 9.9 10*3/uL (ref 4.0–10.5)
nRBC: 0 % (ref 0.0–0.2)

## 2021-04-03 LAB — PHOSPHORUS
Phosphorus: <1 mg/dL — CL (ref 2.5–4.6)
Phosphorus: <1 mg/dL — CL (ref 2.5–4.6)

## 2021-04-03 LAB — CULTURE, BLOOD (ROUTINE X 2)
Culture: NO GROWTH
Special Requests: ADEQUATE

## 2021-04-03 MED ORDER — POTASSIUM PHOSPHATES 15 MMOLE/5ML IV SOLN
30.0000 mmol | Freq: Once | INTRAVENOUS | Status: AC
  Administered 2021-04-03: 30 mmol via INTRAVENOUS
  Filled 2021-04-03: qty 10

## 2021-04-03 MED ORDER — POTASSIUM CHLORIDE 20 MEQ PO PACK
40.0000 meq | PACK | Freq: Once | ORAL | Status: AC
  Administered 2021-04-03: 40 meq
  Filled 2021-04-03: qty 2

## 2021-04-03 MED ORDER — ASPIRIN 81 MG PO CHEW
81.0000 mg | CHEWABLE_TABLET | Freq: Every day | ORAL | Status: DC
  Administered 2021-04-03 – 2021-04-09 (×7): 81 mg
  Filled 2021-04-03 (×8): qty 1

## 2021-04-03 MED ORDER — PROSOURCE TF PO LIQD
45.0000 mL | Freq: Two times a day (BID) | ORAL | Status: DC
  Administered 2021-04-03 – 2021-04-09 (×14): 45 mL
  Filled 2021-04-03 (×15): qty 45

## 2021-04-03 MED ORDER — LORAZEPAM 0.5 MG PO TABS
0.5000 mg | ORAL_TABLET | Freq: Every day | ORAL | Status: DC
  Administered 2021-04-03 – 2021-04-09 (×7): 0.5 mg
  Filled 2021-04-03 (×7): qty 1

## 2021-04-03 MED ORDER — PAROXETINE HCL 20 MG PO TABS
20.0000 mg | ORAL_TABLET | Freq: Every morning | ORAL | Status: DC
  Administered 2021-04-03 – 2021-04-11 (×8): 20 mg
  Filled 2021-04-03 (×8): qty 1

## 2021-04-03 MED ORDER — POTASSIUM CHLORIDE CRYS ER 20 MEQ PO TBCR
20.0000 meq | EXTENDED_RELEASE_TABLET | Freq: Once | ORAL | Status: AC
  Administered 2021-04-03: 20 meq via ORAL
  Filled 2021-04-03: qty 1

## 2021-04-03 MED ORDER — FREE WATER
100.0000 mL | Status: DC
  Administered 2021-04-03 – 2021-04-04 (×7): 100 mL

## 2021-04-03 MED ORDER — FUROSEMIDE 20 MG PO TABS
20.0000 mg | ORAL_TABLET | Freq: Every day | ORAL | Status: DC
  Administered 2021-04-03 – 2021-04-05 (×3): 20 mg
  Filled 2021-04-03 (×3): qty 1

## 2021-04-03 MED ORDER — OSMOLITE 1.5 CAL PO LIQD
1000.0000 mL | ORAL | Status: DC
  Administered 2021-04-03 – 2021-04-09 (×7): 1000 mL

## 2021-04-03 MED ORDER — METOPROLOL TARTRATE 5 MG/5ML IV SOLN
5.0000 mg | INTRAVENOUS | Status: DC | PRN
  Administered 2021-04-03 – 2021-04-09 (×9): 5 mg via INTRAVENOUS
  Filled 2021-04-03 (×9): qty 5

## 2021-04-03 MED ORDER — MAGNESIUM SULFATE 2 GM/50ML IV SOLN
2.0000 g | Freq: Once | INTRAVENOUS | Status: AC
  Administered 2021-04-03: 2 g via INTRAVENOUS
  Filled 2021-04-03: qty 50

## 2021-04-03 MED ORDER — SENNOSIDES-DOCUSATE SODIUM 8.6-50 MG PO TABS
1.0000 | ORAL_TABLET | Freq: Every day | ORAL | Status: DC
  Administered 2021-04-03 – 2021-04-08 (×5): 1
  Filled 2021-04-03 (×5): qty 1

## 2021-04-03 MED ORDER — CARVEDILOL 6.25 MG PO TABS
6.2500 mg | ORAL_TABLET | Freq: Two times a day (BID) | ORAL | Status: DC
  Administered 2021-04-03: 6.25 mg
  Filled 2021-04-03: qty 1

## 2021-04-03 NOTE — Progress Notes (Signed)
Nutrition Follow-up  DOCUMENTATION CODES:   Not applicable  INTERVENTION:   Tube Feeding via PEG: Osmolite 1.5 at 60 ml/hr Begin at 20 ml/hr; titrate by 10 mL q 4 hours until goal rate Pro-Stat 45 mL daily Provides 101 g of protein, 2200 kcals and 1094 mL of free water  Total free water with goal rate TF plus free water flush of 100 mL q 4 hours: 1694 mL  Assess best feeding modality for discharge (continuous vs intermittent vs bolus) once pt tolerating TF at goal rate  Continue to monitor electrolytes with aggressive repletion of phosphorus given level <1.0   NUTRITION DIAGNOSIS:   Inadequate oral intake related to dysphagia, chronic illness as evidenced by NPO status.  Being addressed via TF  GOAL:   Patient will meet greater than or equal to 90% of their needs  Met via TF   MONITOR:   TF tolerance, Labs, Weight trends, Skin  REASON FOR ASSESSMENT:   Ventilator    ASSESSMENT:   65 yo male with ALS and chronic respiratory failure s/p trach on home vent and admitted with acute on chronic respiratory failure with suspected aspiration pneumonia, severe sepsis  6/15 IR placed G-tube, Bronch  Pt lethargic, opens eyes  NPO TF to begin today  Hypernatremic, free water flushes started today per MD  Pt is wheelchair bound, sleeps reclined in his wheelchair. Need to discuss feeding modality (bolus vs continuous vs intermittent) with patient and wife. Plan to start with 4 hour continuous feedings until tolerance established. Noted current plan is discharge to home.    Labs: phosphorus <1.0 (L), sodium 147 (H), potassium 3.3 (L) Meds: potassium phosphate, KCl, sodium bicarb tabs, lasix  Diet Order:   Diet Order             Diet NPO time specified Except for: Sips with Meds  Diet effective now                   EDUCATION NEEDS:   Not appropriate for education at this time  Skin:  Skin Assessment: Skin Integrity Issues: Skin Integrity Issues:: Stage  I Stage I: sacrum  Last BM:  6/12  Height:   Ht Readings from Last 1 Encounters:  03/19/2021 _0  (1.753 m)    Weight:   Wt Readings from Last 1 Encounters:  03/30/21 79.7 kg     BMI:  Body mass index is 25.95 kg/m.  Estimated Nutritional Needs:   Kcal:  2000-2200 kcals  Protein:  100-115 g  Fluid:  >/= 2 L   Kerman Passey MS, RDN, LDN, CNSC Registered Dietitian III Clinical Nutrition RD Pager and On-Call Pager Number Located in [place]

## 2021-04-03 NOTE — Progress Notes (Addendum)
PROGRESS NOTE    Wayne Young   BSJ:[id number]  DOB: 1955/12/26  PCP: Cipriano Mile, NP    DOA: 04/10/2021 LOS: 5   Assessment & Plan   Principal Problem:   Acute on chronic respiratory failure with hypoxia (HCC) Active Problems:   Rheumatoid arthritis with positive rheumatoid factor (HCC)   Protein-calorie malnutrition, severe   Pressure injury of skin   Acute on chronic respiratory failure with hypoxemia (HCC)   ALS (amyotrophic lateral sclerosis) (HCC)   Hyperlipidemia   Hypertension   Type 2 diabetes mellitus, with long-term current use of insulin (HCC)   CAP (community acquired pneumonia)   Hypokalemia   Tracheostomy dependent (HCC)   Hypoalbuminemia   History of TB (tuberculosis)   Cerebral thrombosis with cerebral infarction    Acute on chronic respiratory failure with hypoxia due to Aspiration pneumonia in chronic vent patient with  Severe sepsis present on admission  (As evidenced by tachycardia, leukocytosis, PNA on imaging and worsened hypoxia consistent with organ dysfunction) Immune compromised state - PCCM following, appreciated.  See their recs. 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 NPO and pursue PEG versus hospice.   --Goals of care discussions by prior attending noted --Palliative care consulted, appreciate assistance with [place] talks  --Continue NPO - may have whole med in apple sauce but he started refusing many of them when he became NPO --substitute PO meds to IV where able --Resume home Lasix given diffuse edema (no h/o CHF) --Stop fluids (was on NS + 20 meq KCL at 75 cc/hr) --G-tube placed by IR 6/15 --Tube feeds - start today (6/16) per dietician recs --Free water flushes 100 cc q4h & monitor sodium  Anion gap metabolic acidosis - CO2 76>>54 today (6/15), gap 16. Unclear cause.  Normal lactate.  Not uremic or on diuretics.  No ketonuria on UA.  Monitor.  Consider renal consult.  Hold Jardiance. On sodium bicarb per tube.   Blood culture +Staphylococcus species Abnormal UA - ?UTI - Urine culture multiple species.  Single set of blood culture grew staph capitis and is likely a contaminant.  Monitor clinically.   Hypokalemia - likely due to Lasix; K was 2.8, replaced. Prescribed 20 mEq twice daily at home. --Monitor and replace PRN (can do PO per tube)  Acute CVA - punctate ischemic in Left ACA territory on MRI. Neurology consulted and feels this is incidental finding.  Suspect due to small vessel disease.   --Started on ASA - continue at d/c --Follow up echo --LDL 45, A1c 6.7% - both at goal  Right Eye RAPD - chronic, due to known blindness -  Diabetic retinopathy --See neurology note for details, confirmed by wife this is a known issue. --No further work up indicated    Anasarca - Takes 20 mg of Lasix daily; reports he  drinks a lot of fluid at home and his wife agrees --Echo in Dec 2021 with EF 60 to 65% with mild LVH - Albumin level 3.1, Prealbumin is 10 --Resumed home Lasix  --Monitor volume status   Rheumatoid arthritis with positive rheumatoid factor -  On methotrexate weekly on Thursday -HOLDin setting of pneumonia    Hyperlipidemia - hold Lipitor while NPO   Hypertension - Home regimen: Coreg, losartan, Lasix --resume home Coreg and Lasix --hold losartan, resume as BP will tolerate --IV Lopressor 2.5 mg every 8 hrs   Type 2 diabetes mellitus, with long-term current use of insulin -  --Holding empagliflozin, glipizide  --Recent poor OP intake.  -prior attending discontinued NovoLog SSI to prevent hypoglycemia --follow CBG's, place back on SSI if above inpatient goal 140-180   Depression - continue home Paxil   Insomnia -  - cont Ativan 0.5 QHS- wife states often times he needs a second dose as 1 dose nothing for him- follow- increase as needed   History of Tuberculosis - no acute issue    Pressure Injury of Skin - Present on Admission WOC consulted - see wound care recs. Frequent repositioning at least every 2 hrs.  Pressure Injury 08/03/18 Stage II -  Partial thickness loss of dermis presenting as a shallow open ulcer with a red, pink wound bed without slough. This is patchy areas of full thickness skin loss related to moisture associated skin damage, NOT a pressur (Active)  08/03/18 0800  Location: Sacrum  Location Orientation: Medial  Staging: Stage II -  Partial thickness loss of dermis presenting as a shallow open ulcer with a red, pink wound bed without slough.  Wound Description (Comments): This is patchy areas of full thickness skin loss related to moisture associated skin damage, NOT a pressure injury  Present on Admission: No     Pressure Injury 03/21/2021 Sacrum Medial Stage 1 -  Intact skin with non-blanchable redness of a localized area usually over a bony prominence. non-blanchable (Active)  04/01/2021 1600  Location: Sacrum  Location Orientation: Medial  Staging: Stage 1 -  Intact skin with non-blanchable redness of a localized area usually over a bony prominence.  Wound Description (Comments): non-blanchable  Present on Admission: Yes         Patient BMI: Body mass  index is 25.95 kg/m.   DVT prophylaxis: enoxaparin (LOVENOX) injection 40 mg Start: 04/03/21 1000   Diet:  Diet Orders (From admission, onward)     Start     Ordered   03/31/21 1044  Diet NPO time specified Except for: Sips with Meds  Diet effective now       Comments: Medication in puree  Question:  Except for  Answer:  Ferrel Logan with Meds   03/31/21 1044              Code Status: DNR   Brief Narrative / Hospital Course to Date:   Aedin Jeansonne is a 65 year old male with rheumatoid arthritis, ALS with a trach on home vent, diabetes mellitus, hypertension. Presented due to oxygen desaturations into the 80s after nebulizer treatments.  In the ED, patient was febrile with temp 101 F, tachypneic and mildly tachycardic, with leukocytosis on CBC and  hypokalemia.  Anasarca noted on exam.  CT scan of the chest showed extensive left lung opacities and debris in the mainstem bronchus.    Started on vancomycin and cefepime for sepsis secondary to pneumonia.  Admitted to [facility] service.    Pulmonology consulted and following.  Subjective 04/03/21    Patient sleeping but opens eyes to name.  RN reports he was more alert earlier this AM.  HR's up today.  BP's up also but somewhat labile.  No acute events reported.   Disposition Plan & Communication   Status is: Inpatient  Remains inpatient appropriate because:Ongoing active pain requiring inpatient pain management and IV treatments appropriate due to intensity of illness or inability to take PO  Dispo: The patient is from: Home              Anticipated d/c is to: Home              Patient currently is not medically stable to d/c.   Difficult to place patient No  Family Communication: None present   Consults, Procedures, Significant Events   Consultants:  PCCM Interventional radiology Neurology Palliative care  Procedures:  G-tube placement 6/15 Bronchoscopy  Antimicrobials:  Anti-infectives (From admission, onward)     Start     Dose/Rate Route Frequency Ordered Stop   04/02/21 1540  ceFAZolin (ANCEF) IVPB 2g/100 mL premix        over 30 Minutes Intravenous Continuous PRN 04/02/21 1548 04/02/21 1540   04/01/21 1230  Ampicillin-Sulbactam (UNASYN) 3 g in sodium chloride 0.9 % 100 mL IVPB        3 g 200 mL/hr over 30 Minutes Intravenous Every 8 hours 04/01/21 1140 04/09/21 0559   03/31/21 2300  ceFEPIme (MAXIPIME) 2 g in sodium chloride 0.9 % 100 mL IVPB  Status:  Discontinued        2 g 200 mL/hr over 30 Minutes Intravenous Every 8 hours 03/31/21 1542 04/01/21 1044   03/31/21 2200  vancomycin (VANCOREADY) IVPB 750 mg/150 mL  Status:  Discontinued        750 mg 150 mL/hr over 60 Minutes Intravenous Every 12 hours 03/31/21 1143 04/01/21 1044   03/31/21 1045  vancomycin (VANCOREADY) IVPB 1250 mg/250 mL        1,250 mg 166.7 mL/hr over 90 Minutes Intravenous  Once 03/31/21 0957 03/31/21 1302   04/02/2021 1700  vancomycin (VANCOREADY) IVPB 1000 mg/200 mL  Status:  Discontinued        1,000 mg 200 mL/hr over 60 Minutes Intravenous Every 12 hours 04/16/2021 0943 04/04/2021 1115   04/02/2021 1300  ceFEPIme (MAXIPIME) 2 g in sodium chloride 0.9 % 100 mL IVPB  Status:  Discontinued        2 g 200 mL/hr over 30 Minutes Intravenous Every 8 hours 03/25/2021 0833 03/31/21 1542   04/17/2021 1300  vancomycin (VANCOREADY) IVPB 1500 mg/300 mL  Status:  Discontinued        1,500 mg 150 mL/hr over 120 Minutes Intravenous Every 8 hours 03/26/2021 0941 04/10/2021 0943   04/13/2021 0530  vancomycin (VANCOREADY) IVPB 1000 mg/200 mL  Status:  Discontinued        1,000 mg 200 mL/hr over 60 Minutes Intravenous  Once 04/07/2021 0517 03/23/2021 0518   04/17/2021 0530  ceFEPIme (MAXIPIME) 2 g in sodium chloride 0.9 % 100 mL IVPB        2 g 200 mL/hr over 30 Minutes Intravenous  Once 04/10/2021 0517 04/06/2021 0549   04/07/2021 0530  vancomycin (VANCOREADY) IVPB 1250 mg/250 mL        1,250 mg 166.7 mL/hr over 90 Minutes Intravenous  Once 03/19/2021 0518  04/07/2021 0728         Micro    Objective   Vitals:   04/03/21 1601 04/03/21  1616 04/03/21 1630 04/03/21 1700  BP: 121/68 (!) 181/86 (!) 167/94 (!) 171/100  Pulse: (!) 110 (!) 113  (!) 108  Resp: 17     Temp:      TempSrc:      SpO2: 97%   95%  Weight:      Height:        Intake/Output Summary (Last 24 hours) at 04/03/2021 1727 Last data filed at 04/03/2021 1700 Gross per 24 hour  Intake 1715.02 ml  Output 1150 ml  Net 565.02 ml   Filed Weights   03/28/2021 0323 03/30/21 0600  Weight: 68 kg 79.7 kg    Physical Exam:  General exam: sleeping comfortably, opens eyes briefly to voice, no acute distress, nonverbal HEENT: trach in place on vent Respiratory system: Diminished breath sounds, no wheezes, normal respiratory effort. Cardiovascular system: irregularly irregular, 2+ DP pulses b/l Gastrointestinal system: soft, NT, ND,+bowel sounds, G tube in place. Extremities: edema of all extremities    Labs   Data Reviewed: I have personally reviewed following labs and imaging studies  CBC: Recent Labs  Lab 04/09/2021 0515 03/21/2021 1038 03/30/21 0031 03/31/21 0350 04/01/21 1816 04/01/21 1831 04/02/21 0153 04/03/21 0037  WBC 17.0*  --  16.8* 12.1*  --  12.3* 12.7* 9.9  NEUTROABS 14.8*  --   --   --   --   --   --   --   HGB 14.3   < > 14.4 13.0 13.9 13.3 12.8* 13.9  HCT 45.5   < > 46.5 41.5 41.0 43.7 40.6 44.4  MCV 82.3  --  83.2 84.5  --  85.9 83.7 83.8  PLT 278  --  303 255  --  279 235 238   < > = values in this interval not displayed.   Basic Metabolic Panel: Recent Labs  Lab 03/27/2021 0938 04/06/2021 1038 03/31/21 0350 04/01/21 0114 04/01/21 1814 04/01/21 1816 04/01/21 1831 04/03/21 0037 04/03/21 1402  NA  --    < > 139 139 145 147* 142 147*  --   K  --    < > 2.8* 3.4* 4.1 3.9 4.0 3.3*  --   CL  --    < > 106 109 112*  --  112* 118*  --   CO2  --    < > 17* 16* 16*  --  14* 16*  --   GLUCOSE  --    < > 129* 128* 127*  --  134* 164*  --   BUN  --     < > _0 --  10 5*  --   CREATININE  --    < > 0.53* 0.55* 0.57*  --  0.58* 0.61  --   CALCIUM  --    < > 9.2 9.4 10.0  --  9.6 9.7  --   MG 1.9  --  2.1  --   --   --   --  2.0 1.9  PHOS  --   --   --   --   --   --   --   --  <1.0*   < > = values in this interval not displayed.   GFR: Estimated Creatinine Clearance: 89.6 mL/min (by C-G formula based on SCr of 0.61 mg/dL). Liver Function Tests: Recent Labs  Lab 04/14/2021 0515 04/01/21 1831  AST 16 9*  ALT 14 11  ALKPHOS 84 79  BILITOT 1.2 2.2*  PROT 8.1 7.6  ALBUMIN 3.1* 2.5*   No results for input(s): LIPASE, AMYLASE in the last 168 hours. Recent Labs  Lab 04/17/2021 1530  AMMONIA 11   Coagulation Profile: Recent Labs  Lab 04/02/2021 0515  INR 1.3*   Cardiac Enzymes: No results for input(s): CKTOTAL, CKMB, CKMBINDEX, TROPONINI in the last 168 hours. BNP (last 3 results) No results for input(s): PROBNP in the last 8760 hours. HbA1C: No results for input(s): HGBA1C in the last 72 hours. CBG: Recent Labs  Lab 04/02/21 0806 04/02/21 1630 04/03/21 0023 04/03/21 0800 04/03/21 1546  GLUCAP 137* 160* 164* 151* 168*   Lipid Profile: Recent Labs    04/02/21 0831  CHOL 86  HDL 28*  LDLCALC 45  TRIG 66  CHOLHDL 3.1   Thyroid Function Tests: No results for input(s): TSH, T4TOTAL, FREET4, T3FREE, THYROIDAB in the last 72 hours. Anemia Panel: No results for input(s): VITAMINB12, FOLATE, FERRITIN, TIBC, IRON, RETICCTPCT in the last 72 hours. Sepsis Labs: Recent Labs  Lab 04/14/2021 0519 03/27/2021 1229 04/01/21 1831  PROCALCITON  --  1.17  --   LATICACIDVEN 1.6  --  0.9    Recent Results (from the past 240 hour(s))  Urine culture     Status: Abnormal   Collection Time: 03/27/2021  3:35 AM   Specimen: In/Out Cath Urine  Result Value Ref Range Status   Specimen Description IN/OUT CATH URINE  Final   Special Requests   Final    NONE Performed at [facility], [address] [place], [place] [address]     Culture MULTIPLE SPECIES PRESENT, SUGGEST RECOLLECTION (A)  Final   Report Status 03/30/2021 FINAL  Final  Resp Panel by RT-PCR (Flu A&B, Covid) Nasopharyngeal Swab     Status: None   Collection Time: 04/13/2021  3:52 AM   Specimen: Nasopharyngeal Swab; Nasopharyngeal(NP) swabs in vial transport medium  Result Value Ref Range Status   SARS Coronavirus 2 by RT PCR NEGATIVE NEGATIVE Final    Comment: (NOTE) SARS-CoV-2 target nucleic acids are NOT DETECTED.  The SARS-CoV-2 RNA is generally detectable in upper respiratory specimens during the acute phase of infection. The lowest concentration of SARS-CoV-2 viral copies this assay can detect is 138 copies/mL. A negative result does not preclude SARS-Cov-2 infection and should not be used as the sole basis for treatment or other patient management decisions. A negative result may occur with  improper specimen collection/handling, submission of specimen other than nasopharyngeal swab, presence of viral mutation(s) within the areas targeted by this assay, and inadequate number of viral copies(<138 copies/mL). A negative result must be combined with clinical observations, patient history, and epidemiological information. The expected result is Negative.  Fact Sheet for Patients:  EntrepreneurPulse.com.au  Fact Sheet for Healthcare Providers:  IncredibleEmployment.be  This test is no t yet approved or cleared by the [place] FDA and  has been authorized for detection and/or diagnosis of SARS-CoV-2 by FDA under an Emergency Use Authorization (EUA). This EUA will remain  in effect (meaning this test can be used) for the duration of the COVID-19 declaration under Section 564(b)(1) of the Act, 21 U.S.C.section 360bbb-3(b)(1), unless the authorization is terminated  or revoked sooner.       Influenza A by PCR NEGATIVE NEGATIVE Final   Influenza B by PCR NEGATIVE NEGATIVE Final    Comment: (NOTE) The  Xpert Xpress SARS-CoV-2/FLU/RSV plus assay is intended as an aid in the diagnosis of influenza from Nasopharyngeal swab specimens and should not be used as a  sole basis for treatment. Nasal washings and aspirates are unacceptable for Xpert Xpress SARS-CoV-2/FLU/RSV testing.  Fact Sheet for Patients: EntrepreneurPulse.com.au  Fact Sheet for Healthcare Providers: IncredibleEmployment.be  This test is not yet approved or cleared by the [place] FDA and has been authorized for detection and/or diagnosis of SARS-CoV-2 by FDA under an Emergency Use Authorization (EUA). This EUA will remain in effect (meaning this test can be used) for the duration of the COVID-19 declaration under Section 564(b)(1) of the Act, 21 U.S.C. section 360bbb-3(b)(1), unless the authorization is terminated or revoked.  Performed at [facility], [place] [address] [place], [place] [address]   Blood Culture (routine x 2)     Status: Abnormal   Collection Time: 04/01/2021  5:08 AM   Specimen: BLOOD  Result Value Ref Range Status   Specimen Description   Final    BLOOD LEFT ANTECUBITAL Performed at [facility], [address] [place], [place] [address]    Special Requests   Final    BOTTLES DRAWN AEROBIC AND ANAEROBIC Blood Culture adequate volume Performed at [facility], [address] [place], [place] [address]    Culture  Setup Time   Final    GRAM POSITIVE COCCI IN CLUSTERS IN BOTH AEROBIC AND ANAEROBIC BOTTLES CRITICAL RESULT CALLED TO, READ BACK BY AND VERIFIED WITH: V. BRYK,PHARMD 0548 03/30/2021 T. TYSOR    Culture (A)  Final    STAPHYLOCOCCUS CAPITIS THE SIGNIFICANCE OF ISOLATING THIS ORGANISM FROM A SINGLE SET OF BLOOD CULTURES WHEN MULTIPLE SETS ARE DRAWN IS UNCERTAIN. PLEASE NOTIFY THE MICROBIOLOGY DEPARTMENT WITHIN ONE WEEK IF SPECIATION AND SENSITIVITIES ARE REQUIRED. Performed at [facility], [place] [address],  [place], [place] [address]    Report Status 04/01/2021 FINAL  Final  Blood Culture ID Panel (Reflexed)     Status: Abnormal   Collection Time: 03/22/2021  5:08 AM  Result Value Ref Range Status   Enterococcus faecalis NOT DETECTED NOT DETECTED Final   Enterococcus Faecium NOT DETECTED NOT DETECTED Final   Listeria monocytogenes NOT DETECTED NOT DETECTED Final   Staphylococcus species DETECTED (A) NOT DETECTED Final    Comment: CRITICAL RESULT CALLED TO, READ BACK BY AND VERIFIED WITH: V. Beecher Mcardle 1779 03/30/2021 T. TYSOR    Staphylococcus aureus (BCID) NOT DETECTED NOT DETECTED Final   Staphylococcus epidermidis NOT DETECTED NOT DETECTED Final   Staphylococcus lugdunensis NOT DETECTED NOT DETECTED Final   Streptococcus species NOT DETECTED NOT DETECTED Final   Streptococcus agalactiae NOT DETECTED NOT DETECTED Final   Streptococcus pneumoniae NOT DETECTED NOT DETECTED Final   Streptococcus pyogenes NOT DETECTED NOT DETECTED Final   A.calcoaceticus-baumannii NOT DETECTED NOT DETECTED Final   Bacteroides fragilis NOT DETECTED NOT DETECTED Final   Enterobacterales NOT DETECTED NOT DETECTED Final   Enterobacter cloacae complex NOT DETECTED NOT DETECTED Final   Escherichia coli NOT DETECTED NOT DETECTED Final   Klebsiella aerogenes NOT DETECTED NOT DETECTED Final   Klebsiella oxytoca NOT DETECTED NOT DETECTED Final   Klebsiella pneumoniae NOT DETECTED NOT DETECTED Final   Proteus species NOT DETECTED NOT DETECTED Final   Salmonella species NOT DETECTED NOT DETECTED Final   Serratia marcescens NOT DETECTED NOT DETECTED Final   Haemophilus influenzae NOT DETECTED NOT DETECTED Final   Neisseria meningitidis NOT DETECTED NOT DETECTED Final   Pseudomonas aeruginosa NOT DETECTED NOT DETECTED Final   Stenotrophomonas maltophilia NOT DETECTED NOT DETECTED Final   Candida albicans NOT DETECTED NOT DETECTED  Final   Candida auris NOT DETECTED NOT DETECTED Final   Candida glabrata NOT DETECTED NOT  DETECTED Final   Candida krusei NOT DETECTED NOT DETECTED Final   Candida parapsilosis NOT DETECTED NOT DETECTED Final   Candida tropicalis NOT DETECTED NOT DETECTED Final   Cryptococcus neoformans/gattii NOT DETECTED NOT DETECTED Final    Comment: Performed at [facility], [place] [address], [place], [place] [address]  Expectorated Sputum Assessment w Gram Stain, Rflx to Resp Cult     Status: None   Collection Time: 04/07/2021  8:31 AM   Specimen: Expectorated Sputum  Result Value Ref Range Status   Specimen Description EXPECTORATED SPUTUM  Final   Special Requests NONE  Final   Sputum evaluation   Final    THIS SPECIMEN IS ACCEPTABLE FOR SPUTUM CULTURE Performed at [facility], [place] [address] [place], [place] [address]    Report Status 03/27/2021 FINAL  Final  MRSA Next Gen by PCR, Nasal     Status: None   Collection Time: 03/19/2021  8:31 AM  Result Value Ref Range Status   MRSA by PCR Next Gen NOT DETECTED NOT DETECTED Final    Comment: (NOTE) The GeneXpert MRSA Assay (FDA approved for NASAL specimens only), is one component of a comprehensive MRSA colonization surveillance program. It is not intended to diagnose MRSA infection nor to guide or monitor treatment for MRSA infections. Test performance is not FDA approved in patients less than 101 years old. Performed at [facility], [place] [address], [place], [place] [address]   Culture, Respiratory w Gram Stain     Status: None   Collection Time: 04/12/2021  8:31 AM  Result Value Ref Range Status   Specimen Description EXPECTORATED SPUTUM  Final   Special Requests NONE Reflexed from [id number]  Final   Gram Stain   Final    ABUNDANT WBC PRESENT, PREDOMINANTLY PMN RARE GRAM POSITIVE COCCI Performed at [facility], [place] [address] [place], [place] [address]    Culture   Final    RARE STAPHYLOCOCCUS AUREUS RARE PROTEUS MIRABILIS    Report Status 04/01/2021 FINAL  Final   Organism ID, Bacteria STAPHYLOCOCCUS  AUREUS  Final   Organism ID, Bacteria PROTEUS MIRABILIS  Final      Susceptibility   Proteus mirabilis - MIC*    AMPICILLIN <=2 SENSITIVE Sensitive     CEFAZOLIN <=4 SENSITIVE Sensitive     CEFEPIME <=0.12 SENSITIVE Sensitive     CEFTAZIDIME <=1 SENSITIVE Sensitive     CEFTRIAXONE <=0.25 SENSITIVE Sensitive     CIPROFLOXACIN <=0.25 SENSITIVE Sensitive     GENTAMICIN <=1 SENSITIVE Sensitive     IMIPENEM 8 INTERMEDIATE Intermediate     TRIMETH/SULFA <=20 SENSITIVE Sensitive     AMPICILLIN/SULBACTAM <=2 SENSITIVE Sensitive     PIP/TAZO <=4 SENSITIVE Sensitive     * RARE PROTEUS MIRABILIS   Staphylococcus aureus - MIC*    CIPROFLOXACIN <=0.5 SENSITIVE Sensitive     ERYTHROMYCIN <=0.25 SENSITIVE Sensitive     GENTAMICIN <=0.5 SENSITIVE Sensitive     OXACILLIN 0.5 SENSITIVE Sensitive     TETRACYCLINE >=16 RESISTANT Resistant     VANCOMYCIN 1 SENSITIVE Sensitive     TRIMETH/SULFA <=10 SENSITIVE Sensitive     CLINDAMYCIN <=0.25 SENSITIVE Sensitive     RIFAMPIN <=0.5 SENSITIVE Sensitive     Inducible Clindamycin NEGATIVE Sensitive     * RARE STAPHYLOCOCCUS AUREUS  Blood Culture (routine x 2)  Status: None   Collection Time: 03/22/2021  9:38 AM   Specimen: BLOOD RIGHT HAND  Result Value Ref Range Status   Specimen Description BLOOD RIGHT HAND  Final   Special Requests   Final    BOTTLES DRAWN AEROBIC AND ANAEROBIC Blood Culture adequate volume   Culture   Final    NO GROWTH 5 DAYS Performed at [facility], [address], [place], [place] [address]    Report Status 04/03/2021 FINAL  Final  Culture, Respiratory w Gram Stain     Status: None (Preliminary result)   Collection Time: 04/02/21  2:05 PM   Specimen: Tracheal Aspirate; Respiratory  Result Value Ref Range Status   Specimen Description TRACHEAL ASPIRATE  Final   Special Requests NONE  Final   Gram Stain   Final    ABUNDANT WBC PRESENT,BOTH PMN AND MONONUCLEAR FEW GRAM NEGATIVE RODS FEW GRAM VARIABLE ROD RARE  YEAST    Culture   Final    CULTURE REINCUBATED FOR BETTER GROWTH Performed at [facility], [place] [address] [place], [place] [address]    Report Status PENDING  Incomplete      Imaging Studies   CT ANGIO HEAD NECK W WO CM  Result Date: 04/02/2021 CLINICAL DATA:  Punctate superior left parietal infarction shown by MRI. Clinical concern for retinal artery occlusion. EXAM: CT ANGIOGRAPHY HEAD AND NECK TECHNIQUE: Multidetector CT imaging of the head and neck was performed using the standard protocol during bolus administration of intravenous contrast. Multiplanar CT image reconstructions and MIPs were obtained to evaluate the vascular anatomy. Carotid stenosis measurements (when applicable) are obtained utilizing NASCET criteria, using the distal internal carotid diameter as the denominator. CONTRAST:  34m OMNIPAQUE IOHEXOL 350 MG/ML SOLN COMPARISON:  MRI same day FINDINGS: CT HEAD FINDINGS Brain: Brain shows mild age related volume loss without evidence of old or acute small or large vessel infarction by CT. No mass, hemorrhage, hydrocephalus or extra-axial collection. Vascular: There is atherosclerotic calcification of the major vessels at the base of the brain. Skull: Negative Sinuses: Clear/normal Orbits: None Review of the MIP images confirms the above findings CTA NECK FINDINGS Aortic arch: Aortic atherosclerosis. Innominate artery origin not included in the region studied. No origin stenosis of the left common carotid artery or left subclavian artery. Right carotid system: Common carotid artery widely patent to the bifurcation. Carotid bifurcation is normal without soft or calcified plaque. Cervical ICA is widely patent. Left carotid system: Common carotid artery widely patent to bifurcation. Minimal soft plaque at the bifurcation but no stenosis. Cervical ICA widely patent to the skull base. Vertebral arteries: Both vertebral artery origins are widely patent. Both vertebral arteries appear  normal through the cervical region to the foramen magnum. 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 Otherwise unremarkable. Other neck: No mass or lymphadenopathy.  Tracheostomy in place. Upper chest: Left effusion with consolidation of the left upper lobe. Review of the MIP images confirms the above findings CTA HEAD FINDINGS Anterior circulation: Both internal carotid arteries are patent through the skull base and siphon regions. Mild siphon atherosclerotic calcification but without stenosis greater than 30%. The anterior and middle cerebral vessels are patent, but are small and somewhat irregular. No large or medium vessel occlusion. There is stenosis both supraclinoid internal carotid arteries estimated at 50% on the right and 30% on the left. Posterior circulation: Both vertebral arteries are patent through the foramen magnum. There is atherosclerotic calcification scratched at there is atherosclerotic disease of both  vertebral artery V4 segments but both do reach the basilar. The basilar is a small irregular vessel does show flow. Superior cerebellar and posterior cerebral arteries show flow, but are small vessels. Venous sinuses: Patent and normal. Anatomic variants: None significant. Review of the MIP images confirms the above findings IMPRESSION: No carotid bifurcation stenosis.  Minimal atherosclerotic plaque. Intracranial vascular disease with diffuse narrowing of the intracranial branch vessels most consistent with atherosclerosis. Diffuse vaso spasm could be considered, but there does not appear to be an etiology for that. 50% stenosis of the supraclinoid ICA on the right and 30% stenosis of the supraclinoid ICA on left. Electronically Signed   By: Nelson Chimes M.D.   On: 04/02/2021 12:58   DG Skull 1-3 Views  Result Date: 04/02/2021 CLINICAL DATA:  Pre MRI EXAM: SKULL - 1-3 VIEW COMPARISON:  None. FINDINGS: There is no evidence of metallic foreign body within the orbits. No significant  bone abnormality identified. IMPRESSION: No evidence of metallic foreign body within the orbits. Electronically Signed   By: Rolm Baptise M.D.   On: 04/02/2021 00:05   DG Pelvis 1-2 Views  Result Date: 04/02/2021 CLINICAL DATA:  Pre MRI EXAM: PELVIS - 1-2 VIEW COMPARISON:  None. FINDINGS: Skin staples are noted in the left inguinal region. There is a metallic battery like foreign body seen in the right lower quadrant overlying the iliac crest of unknown etiology or location. Recommend clinical correlation. IMPRESSION: Battery like foreign body projects over the right iliac crest of unknown etiology or exact location. Left inguinal skin staples. Electronically Signed   By: Rolm Baptise M.D.   On: 04/02/2021 00:06   DG Abd 1 View  Result Date: 04/02/2021 CLINICAL DATA:  MRI screening EXAM: ABDOMEN - 1 VIEW COMPARISON:  12/06/2020 FINDINGS: Electronic device projects in the right lower quadrant. Unclear whether this is internal or external. This was present on 12/06/2020, but in a different location. IMPRESSION: Electronic device in the right lower quadrant, indeterminate for MRI. Unclear if this device is internal or external. Electronically Signed   By: Ulyses Jarred M.D.   On: 04/02/2021 00:11   CT ABDOMEN W CONTRAST  Result Date: 04/02/2021 CLINICAL DATA:  Stroke, preop planning for gastrostomy catheter EXAM: CT ABDOMEN WITH CONTRAST TECHNIQUE: Multidetector CT imaging of the abdomen was performed using the standard protocol following bolus administration of intravenous contrast. CONTRAST:  83m OMNIPAQUE IOHEXOL 350 MG/ML SOLN COMPARISON:  CT chest 03/26/2021 and previous FINDINGS: Lower chest: Small left pleural effusion as before. Fairly dense consolidation in most of the visualized left lung base, and worsening airspace consolidation posteriorly in the visualized right lower lobe. Hepatobiliary: Scattered subcentimeter low-attenuation lesions statistically most likely cysts in the absence of a  history of primary carcinoma, but too small to accurately characterize. Gallbladder is nondistended. No biliary ductal dilatation. Pancreas: Unremarkable. No pancreatic ductal dilatation or surrounding inflammatory changes. Spleen: Normal in size without focal abnormality. Adrenals/Urinary Tract: Adrenal glands unremarkable. No hydronephrosis. Stomach/Bowel: Stomach is incompletely distended. There is safe percutaneous window for gastrostomy tube placement. Visualized small bowel and colon are nondilated, unremarkable. Vascular/Lymphatic: Aortic Atherosclerosis (ICD10-170.0) without aneurysm or stenosis. No abdominal or mesenteric adenopathy. Other: No ascites.  No free air. Musculoskeletal: No acute or significant osseous findings. IMPRESSION: 1. No evident anatomic contraindications to percutaneous gastrostomy placement. 2. Worsening bibasilar pulmonary airspace disease with persistent small left effusion. Electronically Signed   By: DLucrezia EuropeM.D.   On: 04/02/2021 13:08   MR BRAIN WO CONTRAST  Result Date: 04/02/2021 CLINICAL DATA:  Neuro deficit, acute, stroke suspected Concern for retinal artery occlusion EXAM: MRI HEAD WITHOUT CONTRAST TECHNIQUE: Multiplanar, multiecho pulse sequences of the brain and surrounding structures were obtained without intravenous contrast. COMPARISON:  07/28/2018 FINDINGS: Brain: Punctate focus of abnormal diffusion restriction in the superior left parietal lobe. No acute or chronic hemorrhage. There is multifocal hyperintense T2-weighted signal within the white matter. Parenchymal volume and CSF spaces are normal. The midline structures are normal. Vascular: Major flow voids are preserved. Skull and upper cervical spine: Normal calvarium and skull base. Visualized upper cervical spine and soft tissues are normal. Sinuses/Orbits:No paranasal sinus fluid levels or advanced mucosal thickening. No mastoid or middle ear effusion. Normal orbits. IMPRESSION: 1. Punctate focus of  acute ischemia in the superior left parietal lobe. No hemorrhage or mass effect. 2. Findings of chronic microvascular ischemia. Electronically Signed   By: Ulyses Jarred M.D.   On: 04/02/2021 01:09   IR GASTROSTOMY TUBE MOD SED  Result Date: 04/02/2021 INDICATION: 65 year old gentleman with history of stroke and dysphagia presents to IR for percutaneous gastrostomy tube placement. EXAM: Fluoroscopy guided percutaneous gastrostomy tube placement MEDICATIONS: Ancef 2 g IV; Antibiotics were administered within 1 hour of the procedure. Glucagon 1 mg IV ANESTHESIA/SEDATION: Versed 1 mg IV; Fentanyl 50 mcg IV Moderate Sedation Time:  9 minutes The patient was continuously monitored during the procedure by the interventional radiology nurse under my direct supervision. CONTRAST:  20 mL of Omnipaque 300-administered into the gastric lumen. FLUOROSCOPY TIME:  Fluoroscopy Time: 2 minutes 0 seconds (7 mGy). COMPLICATIONS: None immediate. PROCEDURE: Informed written consent was obtained from the patient's wife after a thorough discussion of the procedural risks, benefits and alternatives. All questions were addressed. Maximal Sterile Barrier Technique was utilized including caps, mask, sterile gowns, sterile gloves, sterile drape, hand hygiene and skin antiseptic. A timeout was performed prior to the initiation of the procedure. An orogastric tube was placed with fluoroscopic guidance. The anterior abdomen was prepped and draped in sterile fashion. Ultrasound evaluation of the left upper quadrant was performed to confirm the position of the liver. The skin and subcutaneous tissues were anesthetized with 1% lidocaine. Single gastropexy was placed. 17 gauge needle was directed into the distended stomach with fluoroscopic guidance. A wire was advanced into the stomach. 9-French vascular sheath was placed and the orogastric tube was snared using a Gooseneck snare device. The orogastric tube and snare were pulled out of the  patient's mouth. The snare device was connected to a 20-French gastrostomy tube. The snare device and gastrostomy tube were pulled through the patient's mouth and out the anterior abdominal wall. The gastrostomy tube was cut to an appropriate length. Contrast injection through gastrostomy tube confirmed placement within the stomach. G tube was flushed and covered with sterile dressing. IMPRESSION: 20 French gastrostomy tube placed utilizing fluoroscopic guidance. Electronically Signed   By: Miachel Roux M.D.   On: 04/02/2021 16:19   DG CHEST PORT 1 VIEW  Result Date: 04/02/2021 CLINICAL DATA:  Left hemithorax opacification follow-up EXAM: PORTABLE CHEST 1 VIEW COMPARISON:  04/02/2021 FINDINGS: Tracheostomy tube is noted in satisfactory position. Patchy infiltrate is noted throughout the left lung as well as the right lung base. Following bronchoscopy significant improved aeration in the left lung is noted. No sizable effusion is seen. IMPRESSION: Bilateral infiltrates left greater than right with significant improved aeration in the left lung following bronchoscopy and suction. Electronically Signed   By: Inez Catalina M.D.   On: 04/02/2021  15:39   DG CHEST PORT 1 VIEW  Result Date: 04/02/2021 CLINICAL DATA:  Hypoxia EXAM: PORTABLE CHEST 1 VIEW COMPARISON:  04/14/2021 FINDINGS: Cardiac shadow is stable in appearance. Tracheostomy tube is noted. Right lung demonstrates mild basilar atelectasis. Left lung demonstrates increasing consolidation and pleural effusion with only minimal residual aerated lung. No acute bony abnormality is noted. IMPRESSION: Significant increase in consolidation and opacification within the left hemithorax consistent with worsening infiltrate and likely underlying effusion. Electronically Signed   By: Inez Catalina M.D.   On: 04/02/2021 08:10   ECHOCARDIOGRAM COMPLETE  Result Date: 04/02/2021    ECHOCARDIOGRAM REPORT   Patient Name:   RAGAN REALE Date of Exam: 04/02/2021 Medical  Rec #:  [id number]       Height:       69.0 in Accession #:    [id number]      Weight:       175.7 lb Date of Birth:  Aug 11, 1956       BSA:          1.955 m Patient Age:    65 years        BP:           127/54 mmHg Patient Gender: M               HR:           101 bpm. Exam Location:  Inpatient Procedure: 2D Echo, Cardiac Doppler and Color Doppler Indications:    CVA  History:        Patient has prior history of Echocardiogram examinations, most                 recent 09/26/2020. Signs/Symptoms:Dyspnea and Shortness of                 Breath; Risk Factors:Dyslipidemia, Diabetes and Hypertension.  Sonographer:    Hooper Referring Phys: [id number] [place]  1. Left ventricular ejection fraction, by estimation, is 55 to 60%. The left ventricle has normal function. The left ventricle has no regional wall motion abnormalities. Left ventricular diastolic parameters are indeterminate.  2. Right ventricular systolic function is normal. The right ventricular size is moderately enlarged. Tricuspid regurgitation signal is inadequate for assessing PA pressure.  3. The mitral valve is normal in structure. No evidence of mitral valve regurgitation.  4. The aortic valve was not well visualized. Aortic valve regurgitation is not visualized.  5. The inferior vena cava is normal in size with greater than 50% respiratory variability, suggesting right atrial pressure of 3 mmHg. Comparison(s): A prior study was performed on 09/26/2020. Prior images reviewed side by side. Likely no change; this study is more technically difficult. FINDINGS  Left Ventricle: Left ventricular ejection fraction, by estimation, is 55 to 60%. The left ventricle has normal function. The left ventricle has no regional wall motion abnormalities. The left ventricular internal cavity size was normal in size. There is  no left ventricular hypertrophy. Left ventricular diastolic parameters are indeterminate. Right Ventricle: The right ventricular  size is moderately enlarged. No increase in right ventricular wall thickness. Right ventricular systolic function is normal. Tricuspid regurgitation signal is inadequate for assessing PA pressure. Left Atrium: Left atrial size was normal in size. Right Atrium: Right atrial size was normal in size. Pericardium: There is no evidence of pericardial effusion. Mitral Valve: The mitral valve is normal in structure. No evidence of mitral valve regurgitation. Tricuspid Valve: The tricuspid valve is grossly normal. Tricuspid valve  regurgitation is not demonstrated. Aortic Valve: The aortic valve was not well visualized. Aortic valve regurgitation is not visualized. Aortic valve mean gradient measures 4.0 mmHg. Aortic valve peak gradient measures 7.5 mmHg. Pulmonic Valve: The pulmonic valve was not well visualized. Pulmonic valve regurgitation is not visualized. Aorta: The aortic root was not well visualized. Venous: The inferior vena cava is normal in size with greater than 50% respiratory variability, suggesting right atrial pressure of 3 mmHg. IAS/Shunts: The atrial septum is grossly normal.   LV Volumes (MOD) LV vol d, MOD A2C: 55.6 ml Diastology LV vol d, MOD A4C: 32.9 ml LV e' medial:    2.83 cm/s LV vol s, MOD A2C: 23.2 ml LV E/e' medial:  23.7 LV vol s, MOD A4C: 18.2 ml LV e' lateral:   5.22 cm/s LV SV MOD A2C:     32.4 ml LV E/e' lateral: 12.8 LV SV MOD A4C:     32.9 ml LV SV MOD BP:      22.9 ml RIGHT VENTRICLE RV Basal diam:  4.70 cm RV Mid diam:    3.20 cm RV S prime:     10.17 cm/s TAPSE (M-mode): 1.9 cm LEFT ATRIUM             Index       RIGHT ATRIUM           Index LA Vol (A2C):   34.8 ml 17.80 ml/m RA Area:     11.40 cm LA Vol (A4C):   60.7 ml 31.04 ml/m RA Volume:   24.80 ml  12.68 ml/m LA Biplane Vol: 46.5 ml 23.78 ml/m  AORTIC VALVE AV Vmax:           137.00 cm/s AV Vmean:          90.500 cm/s AV VTI:            0.188 m AV Peak Grad:      7.5 mmHg AV Mean Grad:      4.0 mmHg LVOT Vmax:         104.00  cm/s LVOT Vmean:        67.000 cm/s LVOT VTI:          0.148 m LVOT/AV VTI ratio: 0.79 MITRAL VALVE MV Area (PHT): 5.23 cm     SHUNTS MV Decel Time: 145 msec     Systemic VTI: 0.15 m MV E velocity: 67.00 cm/s MV A velocity: 110.00 cm/s MV E/A ratio:  0.61 Rudean Haskell MD Electronically signed by Rudean Haskell MD Signature Date/Time: 04/02/2021/4:51:32 PM    Final      Medications   Scheduled Meds:  aspirin  81 mg Per Tube Daily   carvedilol  6.25 mg Per Tube BID WC   chlorhexidine gluconate (MEDLINE KIT)  15 mL Mouth Rinse BID   Chlorhexidine Gluconate Cloth  6 each Topical Daily   enoxaparin (LOVENOX) injection  40 mg Subcutaneous Q24H   feeding supplement (PROSource TF)  45 mL Per Tube BID   folic acid  1 mg Intravenous Daily   free water  100 mL Per Tube Q4H   furosemide  20 mg Per Tube Daily   ipratropium-albuterol  3 mL Nebulization TID   latanoprost  1 drop Both Eyes QHS   LORazepam  0.5 mg Per Tube QHS   mouth rinse  15 mL Mouth Rinse 10 times per day   PARoxetine  20 mg Per Tube q morning   polyvinyl alcohol  1 drop Both Eyes  TID   senna-docusate  1 tablet Per Tube QHS   sodium bicarbonate  1,300 mg Per Tube TID   Continuous Infusions:  ampicillin-sulbactam (UNASYN) IV Stopped (04/03/21 1442)   famotidine (PEPCID) IV Stopped (04/03/21 1105)   feeding supplement (OSMOLITE 1.5 CAL) 1,000 mL (04/03/21 1320)   potassium PHOSPHATE IVPB (in mmol) 85 mL/hr at 04/03/21 1700       LOS: 5 days    Time spent: 30 minutes with greater than 50% spent at bedside and in coordination of care.    Ezekiel Slocumb, DO [facility]  04/03/2021, 5:27 PM      If 7PM-7AM, please contact night-coverage. How to contact the [facility] Attending or Consulting provider [place] or covering provider during after hours [place], for this patient?    Check the care team in [facility] and look for a) attending/consulting [facility] provider listed and b) the [facility] team listed Log into  [email or web address] and use [place] universal password to access. If you do not have the password, please contact the hospital operator. Locate the [facility] provider you are looking for under [facility] and page to a number that you can be directly reached. If you still have difficulty reaching the provider, please page the [facility] (Director on Call) for the Hospitalists listed on amion for assistance.

## 2021-04-03 NOTE — Progress Notes (Signed)
SLP Cancellation Note  Patient Details Name: Haytham Maher MRN: [id number] DOB: 1956-07-13   Cancelled treatment:       Reason Eval/Treat Not Completed: Fatigue/lethargy limiting ability to participate. Visited pt and RN to determine if pt is trying to communicate/would have success with inline PMSV today. Pt is inconsistently opening eyes, remains lethargic. Will check back next week for changes.    Lynley Killilea, Riley Nearing 04/03/2021, 1:17 PM

## 2021-04-03 NOTE — Progress Notes (Signed)
Patient ID: Wayne Young, male   DOB: [birth year], 65 y.o.   MRN: [id number] Pt s/p 59f pull thru gastrostomy tube placement yesterday; afebrile; WBC nl; hgb stable; G tube intact, insertion site clean and dry, mildly tender; ok to use tube as needed; keep outer disc taut to skin surface to prevent leaking; may place 1 layer of drain sponge gauze under disc to prevent skin irritation

## 2021-04-03 NOTE — Progress Notes (Signed)
Daily Progress Note   Patient Name: Wayne Young       Date: 04/03/2021 DOB: 1956-04-21  Age: 65 y.o. MRN#: [id number] Attending Physician: Pennie Banter, DO Primary Care Physician: Hillery Aldo, NP Admit Date: 03/31/2021  Reason for Consultation/Follow-up: Other - discuss feeding tube  Subjective: Patient is sleeping. Does not arouse. Had feeding tube placed yesterday.  No family at bedside. Chart reviewed- noted incidental finding of punctate infarct.  Per nursing patient is lethargic, follows commands intermittently.    Vital Signs: BP (!) 143/85   Pulse 90   Temp (!) 97.4 F (36.3 C) (Oral)   Resp 16   Ht 5\' 9"  (1.753 m)   Wt 79.7 kg   SpO2 99%   BMI 25.95 kg/m  SpO2: SpO2: 99 % O2 Device: O2 Device: Ventilator O2 Flow Rate: O2 Flow Rate (L/min): 0 L/min  Intake/output summary:  Intake/Output Summary (Last 24 hours) at 04/03/2021 1400 Last data filed at 04/03/2021 1300 Gross per 24 hour  Intake 1579.93 ml  Output 900 ml  Net 679.93 ml   LBM: Last BM Date: 03/30/21 Baseline Weight: Weight: 68 kg Most recent weight: Weight: 79.7 kg       Palliative Assessment/Data: PPS: 10&      Patient Active Problem List   Diagnosis Date Noted  . Cerebral thrombosis with cerebral infarction 04/02/2021  . CAP (community acquired pneumonia) 03/20/2021  . Hypokalemia 03/28/2021  . 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/05/2021  . Hypoalbuminemia 03/24/2021  . History of TB (tuberculosis) 04/08/2021  . Acute on chronic respiratory failure with hypoxia (HCC) 03/28/2021  . ALS (amyotrophic lateral sclerosis) (HCC) 02/07/2021  . Acute on chronic respiratory failure with hypoxemia (HCC) 08/09/2019  . Hyperlipidemia 08/09/2019  . Hypertension 08/09/2019  . Type 2 diabetes  mellitus, with long-term current use of insulin (HCC) 08/09/2019  . Consolidation lung (HCC)   . Acute respiratory distress   . Encounter for imaging study to confirm orogastric (OG) tube placement   . Self extubation   . Aspiration into airway   . Pressure injury of skin 08/04/2018  . Atelectasis of left lung   . Protein-calorie malnutrition, severe 07/28/2018  . Recent unintentional weight loss over several months   . Acute respiratory failure (HCC) 07/25/2018  . 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 Positive QuantiFERON-TB Gold test  07/13/2018  . Rheumatoid arthritis with positive rheumatoid factor (HCC) 07/13/2018  . Incidental pulmonary nodule, > 79mm and < 63mm 07/13/2018  . 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 Weight loss 07/13/2018  . Chronic midline low back pain without sciatica 12/31/2017  . High risk medication use 06/16/2017  . History of rheumatoid arthritis 06/16/2017    Palliative Care Assessment & Plan   Patient Profile: 65 y.o. male  with past medical history of ALS, wheelchair and vent dependent, HTN, DM2, RA, glaucoma, blind in R eye admitted on 04/10/2021 with pneumonia- likely aspiration due to worsening dysphagia from ALS. Palliative consulted to assist with discussion re: PEG tube vs hospice.   Assessment/Recommendations/Plan  Continue current interventions PMT will follow  Code Status: DNR  Prognosis:  Unable to determine  Discharge Planning: To Be Determined  Care plan was discussed with patient's nurse.  Thank you for allowing the Palliative Medicine Team to assist in the care of this patient.   Total time: 29 minutes  Greater than 50%  of this time was spent counseling and coordinating care related to the above assessment and plan.  Ocie Bob, AGNP-C Palliative Medicine   Please contact Palliative Medicine Team phone at [phone] for questions and concerns.

## 2021-04-04 ENCOUNTER — Inpatient Hospital Stay (HOSPITAL_COMMUNITY): Payer: Medicare HMO

## 2021-04-04 DIAGNOSIS — G1221 Amyotrophic lateral sclerosis: Secondary | ICD-10-CM | POA: Diagnosis not present

## 2021-04-04 DIAGNOSIS — L538 Other specified erythematous conditions: Secondary | ICD-10-CM

## 2021-04-04 DIAGNOSIS — R609 Edema, unspecified: Secondary | ICD-10-CM

## 2021-04-04 DIAGNOSIS — Z7189 Other specified counseling: Secondary | ICD-10-CM | POA: Diagnosis not present

## 2021-04-04 LAB — CBC
HCT: 42.6 % (ref 39.0–52.0)
Hemoglobin: 14 g/dL (ref 13.0–17.0)
MCH: 26.1 pg (ref 26.0–34.0)
MCHC: 32.9 g/dL (ref 30.0–36.0)
MCV: 79.3 fL — ABNORMAL LOW (ref 80.0–100.0)
Platelets: 249 10*3/uL (ref 150–400)
RBC: 5.37 MIL/uL (ref 4.22–5.81)
RDW: 18.5 % — ABNORMAL HIGH (ref 11.5–15.5)
WBC: 10.2 10*3/uL (ref 4.0–10.5)
nRBC: 0 % (ref 0.0–0.2)

## 2021-04-04 LAB — GLUCOSE, CAPILLARY
Glucose-Capillary: 270 mg/dL — ABNORMAL HIGH (ref 70–99)
Glucose-Capillary: 283 mg/dL — ABNORMAL HIGH (ref 70–99)
Glucose-Capillary: 305 mg/dL — ABNORMAL HIGH (ref 70–99)
Glucose-Capillary: 317 mg/dL — ABNORMAL HIGH (ref 70–99)
Glucose-Capillary: 323 mg/dL — ABNORMAL HIGH (ref 70–99)
Glucose-Capillary: 396 mg/dL — ABNORMAL HIGH (ref 70–99)

## 2021-04-04 LAB — BASIC METABOLIC PANEL
Anion gap: 7 (ref 5–15)
BUN: 6 mg/dL — ABNORMAL LOW (ref 8–23)
CO2: 24 mmol/L (ref 22–32)
Calcium: 9.4 mg/dL (ref 8.9–10.3)
Chloride: 116 mmol/L — ABNORMAL HIGH (ref 98–111)
Creatinine, Ser: 0.31 mg/dL — ABNORMAL LOW (ref 0.61–1.24)
GFR, Estimated: >60 mL/min (ref >60)
Glucose, Bld: 323 mg/dL — ABNORMAL HIGH (ref 70–99)
Potassium: 2.9 mmol/L — ABNORMAL LOW (ref 3.5–5.1)
Sodium: 147 mmol/L — ABNORMAL HIGH (ref 135–145)

## 2021-04-04 LAB — POCT I-STAT 7, (LYTES, BLD GAS, ICA,H+H)
Acid-Base Excess: 4 mmol/L — ABNORMAL HIGH (ref 0.0–2.0)
Bicarbonate: 29.7 mmol/L — ABNORMAL HIGH (ref 20.0–28.0)
Calcium, Ion: 1.36 mmol/L (ref 1.15–1.40)
HCT: 36 % — ABNORMAL LOW (ref 39.0–52.0)
Hemoglobin: 12.2 g/dL — ABNORMAL LOW (ref 13.0–17.0)
O2 Saturation: 94 %
Patient temperature: 98.5
Potassium: 2.8 mmol/L — ABNORMAL LOW (ref 3.5–5.1)
Sodium: 152 mmol/L — ABNORMAL HIGH (ref 135–145)
TCO2: 31 mmol/L (ref 22–32)
pCO2 arterial: 49.4 mmHg — ABNORMAL HIGH (ref 32.0–48.0)
pH, Arterial: 7.387 (ref 7.350–7.450)
pO2, Arterial: 73 mmHg — ABNORMAL LOW (ref 83.0–108.0)

## 2021-04-04 LAB — PHOSPHORUS
Phosphorus: 1.1 mg/dL — ABNORMAL LOW (ref 2.5–4.6)
Phosphorus: 1.9 mg/dL — ABNORMAL LOW (ref 2.5–4.6)

## 2021-04-04 LAB — MAGNESIUM
Magnesium: 2.2 mg/dL (ref 1.7–2.4)
Magnesium: 2 mg/dL (ref 1.7–2.4)

## 2021-04-04 MED ORDER — LOSARTAN POTASSIUM 25 MG PO TABS: 25.0000 mg | ORAL_TABLET | Freq: Every day | ORAL | Status: DC

## 2021-04-04 MED ORDER — POTASSIUM CHLORIDE 20 MEQ PO PACK
40.0000 meq | PACK | ORAL | Status: AC
Start: 2021-04-04 — End: 2021-04-04
  Administered 2021-04-04 (×2): 40 meq via ORAL
  Filled 2021-04-04 (×2): qty 2

## 2021-04-04 MED ORDER — METOPROLOL TARTRATE 25 MG PO TABS
25.0000 mg | ORAL_TABLET | Freq: Two times a day (BID) | ORAL | Status: DC
  Administered 2021-04-04 – 2021-04-09 (×6): 25 mg
  Filled 2021-04-04 (×9): qty 1

## 2021-04-04 MED ORDER — FREE WATER
200.0000 mL | Status: DC
  Administered 2021-04-04 – 2021-04-07 (×18): 200 mL

## 2021-04-04 MED ORDER — POTASSIUM PHOSPHATES 15 MMOLE/5ML IV SOLN
30.0000 mmol | Freq: Once | INTRAVENOUS | Status: AC
  Administered 2021-04-04: 30 mmol via INTRAVENOUS
  Filled 2021-04-04: qty 10

## 2021-04-04 MED ORDER — INSULIN GLARGINE 100 UNIT/ML ~~LOC~~ SOLN: 5.0000 [IU] | Freq: Every day | SUBCUTANEOUS | Status: DC

## 2021-04-04 MED ORDER — CARVEDILOL 25 MG PO TABS
25.0000 mg | ORAL_TABLET | Freq: Two times a day (BID) | ORAL | Status: DC
  Administered 2021-04-04: 25 mg
  Filled 2021-04-04: qty 1

## 2021-04-04 MED ORDER — HYDROCORTISONE 1 % EX CREA
TOPICAL_CREAM | Freq: Three times a day (TID) | CUTANEOUS | Status: AC
  Administered 2021-04-04 – 2021-04-07 (×2): 1 via TOPICAL
  Filled 2021-04-04: qty 28

## 2021-04-04 MED ORDER — INSULIN GLARGINE 100 UNIT/ML ~~LOC~~ SOLN
10.0000 [IU] | Freq: Every day | SUBCUTANEOUS | Status: DC
  Administered 2021-04-04 – 2021-04-05 (×2): 10 [IU] via SUBCUTANEOUS
  Filled 2021-04-04 (×2): qty 0.1

## 2021-04-04 MED ORDER — CARVEDILOL 12.5 MG PO TABS: 12.5000 mg | ORAL_TABLET | Freq: Two times a day (BID) | ORAL | Status: DC

## 2021-04-04 MED ORDER — INSULIN ASPART 100 UNIT/ML IJ SOLN
0.0000 [IU] | INTRAMUSCULAR | Status: DC
  Administered 2021-04-04: 7 [IU] via SUBCUTANEOUS
  Administered 2021-04-04: 9 [IU] via SUBCUTANEOUS
  Administered 2021-04-04 (×2): 5 [IU] via SUBCUTANEOUS
  Administered 2021-04-04: 7 [IU] via SUBCUTANEOUS
  Administered 2021-04-04: 5 [IU] via SUBCUTANEOUS
  Administered 2021-04-05 (×4): 7 [IU] via SUBCUTANEOUS
  Administered 2021-04-05: 9 [IU] via SUBCUTANEOUS
  Administered 2021-04-05: 5 [IU] via SUBCUTANEOUS
  Administered 2021-04-06 (×3): 7 [IU] via SUBCUTANEOUS

## 2021-04-04 NOTE — Progress Notes (Signed)
Daily Progress Note   Patient Name: Wayne Young       Date: 04/04/2021 DOB: November 02, 1955  Age: 65 y.o. MRN#: [id number] Attending Physician: Wayne Slocumb, DO Primary Care Physician: Wayne Mile, NP Admit Date: 04/01/2021  Reason for Consultation/Follow-up: Establishing goals of care  Subjective: Wayne Young does not wake to my voice or touch.  He has continued to be lethargic during his stay. Prior to this admission he was able to speak with passy-muir valve and interact meaningfully with his family.  I called Wayne Young for more discussions of Wayne Young.  We discussed his mental status and concerns that he is at his new baseline.  We discussed what Wayne Young would consider the minimal tolerable baseline status for him to continue to be prolonged artificially with ventilator and tube feeding.  Wayne Young does not think that Wayne Young would want to be prolonged if he was unable to interact with his family and enjoy watching football. He had researched ALS thoroughly and knew what he was facing and he and Wayne Young have had these conversations. Wayne Young has also discussed this with his brother.  Wayne Young shared a story that Wayne Young was eating cheetos about a month ago and Wayne Young reminded him of their high salt content- she said that he replied in the strongest voice she had heard from [facility] in a while stating- "I don't have much time on this earth left. Let me have what I want".  For now Wayne Young wishes to continue all care through the weekend in hopes of patient's mental status returning to his baseline. She would to re-evaluate on Monday where he is and what his wishes would be.   Review of Systems  Unable to perform ROS: Acuity of condition   Length of Stay: 6  Current Medications: Scheduled Meds:  . aspirin  81 mg Per  Tube Daily  . chlorhexidine gluconate (MEDLINE KIT)  15 mL Mouth Rinse BID  . Chlorhexidine Gluconate Cloth  6 each Topical Daily  . enoxaparin (LOVENOX) injection  40 mg Subcutaneous Q24H  . feeding supplement (PROSource TF)  45 mL Per Tube BID  . folic acid  1 mg Intravenous Daily  . free water  200 mL Per Tube Q4H  . furosemide  20 mg Per Tube Daily  . hydrocortisone cream   Topical TID  . insulin  aspart  0-9 Units Subcutaneous Q4H  . insulin glargine  10 Units Subcutaneous Daily  . ipratropium-albuterol  3 mL Nebulization TID  . latanoprost  1 drop Both Eyes QHS  . LORazepam  0.5 mg Per Tube QHS  . mouth rinse  15 mL Mouth Rinse 10 times per day  . metoprolol tartrate  25 mg Per Tube BID  . PARoxetine  20 mg Per Tube q morning  . polyvinyl alcohol  1 drop Both Eyes TID  . senna-docusate  1 tablet Per Tube QHS  . sodium bicarbonate  1,300 mg Per Tube TID    Continuous Infusions: . ampicillin-sulbactam (UNASYN) IV 3 g (04/04/21 1429)  . famotidine (PEPCID) IV Stopped (04/04/21 1030)  . feeding supplement (OSMOLITE 1.5 CAL) 1,000 mL (04/04/21 1441)  . potassium PHOSPHATE IVPB (in mmol) 85 mL/hr at 04/04/21 1300    PRN Meds: acetaminophen, labetalol, metoprolol tartrate, polyvinyl alcohol  Physical Exam Vitals and nursing note reviewed.  Constitutional:      Comments: lethargic  Neurological:     Comments: Does not respond to my voice or touch            Vital Signs: BP (!) 144/76   Pulse (!) 103   Temp 98.7 F (37.1 C)   Resp 16   Ht 5' 9"  (1.753 m)   Wt 79.7 kg   SpO2 95%   BMI 25.95 kg/m  SpO2: SpO2: 95 % O2 Device: O2 Device: Ventilator O2 Flow Rate: O2 Flow Rate (L/min): 0 L/min  Intake/output summary:  Intake/Output Summary (Last 24 hours) at 04/04/2021 1556 Last data filed at 04/04/2021 1300 Gross per 24 hour  Intake 2129.4 ml  Output 1850 ml  Net 279.4 ml   LBM: Last BM Date: 03/30/21 Baseline Weight: Weight: 68 kg Most recent weight: Weight: 79.7  kg       Palliative Assessment/Data: PPS: 10%      Patient Active Problem List   Diagnosis Date Noted  . Cerebral thrombosis with cerebral infarction 04/02/2021  . CAP (community acquired pneumonia) 04/07/2021  . Hypokalemia 03/26/2021  . Tracheostomy dependent ([place]) 04/09/2021  . Hypoalbuminemia 03/31/2021  . History of TB (tuberculosis) 03/25/2021  . Acute on chronic respiratory failure with hypoxia ([place]) 04/07/2021  . ALS (amyotrophic lateral sclerosis) ([place]) 02/07/2021  . Acute on chronic respiratory failure with hypoxemia ([place]) 08/09/2019  . Hyperlipidemia 08/09/2019  . Hypertension 08/09/2019  . Type 2 diabetes mellitus, with long-term current use of insulin ([place]) 08/09/2019  . Consolidation lung ([place])   . Acute respiratory distress   . Encounter for imaging study to confirm orogastric (OG) tube placement   . Self extubation   . Aspiration into airway   . Pressure injury of skin 08/04/2018  . Atelectasis of left lung   . Protein-calorie malnutrition, severe 07/28/2018  . Recent unintentional weight loss over several months   . Acute respiratory failure ([place]) 07/25/2018  . 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 Rheumatoid arthritis with positive rheumatoid factor ([place]) 07/13/2018  . Incidental pulmonary nodule, > 36m and < 838m09/25/2019  . 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 Weight loss 07/13/2018  . Chronic midline low back pain without sciatica 12/31/2017  . High risk medication use 06/16/2017  . History of rheumatoid arthritis 06/16/2017    Palliative Care Assessment & Plan   Patient Profile: [age].o. male  with past medical history of ALS, wheelchair  and vent dependent, HTN, DM2, RA, glaucoma, blind in R eye admitted on 04/13/2021 with pneumonia- likely aspiration due to worsening dysphagia from ALS. Palliative consulted to assist with discussion re: PEG tube vs hospice.     Assessment/Recommendations/Plan  Continue current care through weekend with hopes of improvement in mental status Per Wayne Young- If patient is not able to interact meaningfully with family, watch and enjoy football- then he would likely not wish to continue with aggressive medical care and artificial life support  Goals of Care and Additional Recommendations: Limitations on Scope of Treatment: Full Scope Treatment  Code Status: DNR  Prognosis:  Unable to determine  Discharge Planning: To Be Determined  Care plan was discussed with patient's spouse- Wayne Young.  Thank you for allowing the Palliative Medicine Team to assist in the care of this patient.   Total time: 38 minutes Greater than 50%  of this time was spent counseling and coordinating care related to the above assessment and plan.  Mariana Kaufman, AGNP-C Palliative Medicine   Please contact Palliative Medicine Team phone at [phone] for questions and concerns.

## 2021-04-04 NOTE — Plan of Care (Signed)
  Problem: Activity: Goal: Ability to tolerate increased activity will improve Outcome: Progressing   Problem: Respiratory: Goal: Ability to maintain a clear airway and adequate ventilation will improve Outcome: Progressing   Problem: Role Relationship: Goal: Method of communication will improve Outcome: Progressing   Problem: Activity: Goal: Ability to tolerate increased activity will improve Outcome: Progressing   Problem: Clinical Measurements: Goal: Ability to maintain a body temperature in the normal range will improve Outcome: Progressing   Problem: Respiratory: Goal: Ability to maintain adequate ventilation will improve Outcome: Progressing Goal: Ability to maintain a clear airway will improve Outcome: Progressing   Problem: Education: Goal: Knowledge of General Education information will improve Description: Including pain rating scale, medication(s)/side effects and non-pharmacologic comfort measures Outcome: Progressing   Problem: Health Behavior/Discharge Planning: Goal: Ability to manage health-related needs will improve Outcome: Progressing   Problem: Clinical Measurements: Goal: Ability to maintain clinical measurements within normal limits will improve Outcome: Progressing Goal: Will remain free from infection Outcome: Progressing Goal: Diagnostic test results will improve Outcome: Progressing Goal: Respiratory complications will improve Outcome: Progressing Goal: Cardiovascular complication will be avoided Outcome: Progressing   Problem: Activity: Goal: Risk for activity intolerance will decrease Outcome: Progressing   Problem: Nutrition: Goal: Adequate nutrition will be maintained Outcome: Progressing   Problem: Coping: Goal: Level of anxiety will decrease Outcome: Progressing   Problem: Elimination: Goal: Will not experience complications related to bowel motility Outcome: Progressing Goal: Will not experience complications related to  urinary retention Outcome: Progressing   Problem: Pain Managment: Goal: General experience of comfort will improve Outcome: Progressing   Problem: Safety: Goal: Ability to remain free from injury will improve Outcome: Progressing   Problem: Skin Integrity: Goal: Risk for impaired skin integrity will decrease Outcome: Progressing

## 2021-04-04 NOTE — Progress Notes (Signed)
Concern for whether or not pt should be placed on home vent for anticipation of discharge. RRT reached out to PCCM to determine whether to place pt on home vent now or keep pt on servo. Per CCM, pt is not ready for home ventilator at this time. Will update RN.

## 2021-04-04 NOTE — Progress Notes (Signed)
Upper extremity venous duplex has been completed.   Preliminary results in CV Proc.   Blanch Media 04/04/2021 1:28 PM

## 2021-04-04 NOTE — Progress Notes (Signed)
Inpatient Diabetes Program Recommendations  AACE/ADA: New Consensus Statement on Inpatient Glycemic Control (2015)  Target Ranges:  Prepandial:   less than 140 mg/dL      Peak postprandial:   less than 180 mg/dL (1-2 hours)      Critically ill patients:  140 - 180 mg/dL   Lab Results  Component Value Date   GLUCAP 270 (H) 04/04/2021   HGBA1C 6.7 (H) 2021-04-02    Review of Glycemic Control Results for Wayne Young, Wayne Young (MRN [id number]) as of 04/04/2021 10:38  Ref. Range 04/03/2021 08:00 04/03/2021 15:46 04/03/2021 23:30 04/04/2021 04:03 04/04/2021 07:36  Glucose-Capillary Latest Ref Range: 70 - 99 mg/dL 517 (H) 001 (H) 749 (H) 323 (H) 270 (H)   Diabetes history: DM 2 Outpatient Diabetes medications:  Jardiance 10 mg daily, Glucotrol 10 mg daily, Novolog 2-10 units prn Current orders for Inpatient glycemic control:  Novolog sensitive q 4 hours Osmolite 60 ml/hr Lantus 10 units daily Inpatient Diabetes Program Recommendations:    Agree with current orders.  May want to increase Novolog correction to moderate q 4 hours. May need basal insulin since patient is now on tube feeds?   Thanks,  Beryl Meager, RN, BC-ADM Inpatient Diabetes Coordinator Pager [phone]  (8a-5p)

## 2021-04-04 NOTE — Progress Notes (Signed)
PROGRESS NOTE    Wayne Young   KXF:[id number]  DOB: 03/13/1956  PCP: Cipriano Mile, NP    DOA: 04/16/2021 LOS: 6   Assessment & Plan   Principal Problem:   Acute on chronic respiratory failure with hypoxia (HCC) Active Problems:   Rheumatoid arthritis with positive rheumatoid factor (HCC)   Protein-calorie malnutrition, severe   Pressure injury of skin   Acute on chronic respiratory failure with hypoxemia (HCC)   ALS (amyotrophic lateral sclerosis) (HCC)   Hyperlipidemia   Hypertension   Type 2 diabetes mellitus, with long-term current use of insulin (HCC)   CAP (community acquired pneumonia)   Hypokalemia   Tracheostomy dependent (HCC)   Hypoalbuminemia   History of TB (tuberculosis)   Cerebral thrombosis with cerebral infarction    Acute on chronic respiratory failure with hypoxia due to Aspiration pneumonia in chronic vent patient with  Severe sepsis present on admission  (As evidenced by tachycardia, leukocytosis, PNA on imaging and worsened hypoxia consistent with organ dysfunction) Immune compromised state - PCCM following, appreciated.  See their recs. 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  Wife reports he sometimes has fast HR.   --change Coreg to Lopressor for less BP lowering effect --monitor closely - get EKG if appearing A-fib on monitor --PRN IV Lopressor ordered  Right upper extremity erythema and ?rash - check RUE venous duplex U/S to rule out DVT.  Topical hydrocortisone.   Wife wonders if another family member w/poison oak rash passed to him.  Monitor closely.  Electrolyte Derangements - pharmacy consulted Hypomagnesemia  Hypophosphatemia Hypokalemia - likely due to Lasix; K was replaced. Prescribed 20 mEq K twice daily at home. --Monitor and replace PRN (can do PO per tube)  Hypernatremia - increasing free water flushes accordingly. Monitor BMP  Anion gap metabolic acidosis -Resolved. Unclear cause.  Normal lactate.  Not uremic or on diuretics.  No ketonuria on UA.  Monitor.  Hold Jardiance. On sodium bicarb per tube.  Monitor BMP.   Blood culture +Staphylococcus species Abnormal UA - ?UTI - Urine culture multiple species.  Single set of blood culture grew staph capitis and is likely a contaminant.  Monitor clinically.   Acute CVA - punctate ischemic in Left ACA territory on MRI. Neurology consulted and feels this is incidental finding.  Suspect due to small vessel disease.   --Started on ASA - continue at d/c --Follow up echo --LDL 45, A1c 6.7% - both at  goal  Right Eye RAPD - chronic, due to known blindness -  Diabetic retinopathy --See neurology note for details, confirmed by wife this is a known issue. --No further work up indicated   Anasarca - Takes 20 mg of Lasix daily;  reports he drinks a lot of fluid at home and his wife agrees --Echo in Dec 2021 with EF 60 to 65% with mild LVH - Albumin level 3.1, Prealbumin is 10 --Resumed home Lasix  --Monitor volume status   Rheumatoid arthritis with positive rheumatoid factor -  On methotrexate weekly on Thursday -Hold in setting of pneumonia   Hyperlipidemia - hold Lipitor while NPO   Hypertension - Home regimen: Coreg, losartan, Lasix --cont home Lasix --Coreg changed to Lopressor as BP dropped significantly on Coreg dose sufficient enough to treat tachycardia --hold losartan, resume as BP will tolerate --IV Lopressor 5 mg PRN SBP>160 or HR>110   Type 2 diabetes mellitus, with long-term current use of insulin -  --Holding empagliflozin, glipizide  --Recent poor OP intake.  -prior attending discontinued NovoLog SSI to prevent hypoglycemia --follow CBG's, place back on SSI if above inpatient goal 140-180   Depression - continue home Paxil   Insomnia -  - cont Ativan 0.5 QHS- wife states often times he needs a second dose as 1 dose nothing for him- follow- increase as needed   History of Tuberculosis - no acute issue    Pressure Injury of Skin - Present on Admission WOC consulted - see wound care recs. Frequent repositioning at least every 2 hrs.  Pressure Injury 08/03/18 Stage II -  Partial thickness loss of dermis presenting as a shallow open ulcer with a red, pink wound bed without slough. This is patchy areas of full thickness skin loss related to moisture associated skin damage, NOT a pressur (Active)  08/03/18 0800  Location: Sacrum  Location Orientation: Medial  Staging: Stage II -  Partial thickness loss of dermis presenting as a shallow open ulcer with a red, pink wound bed without slough.  Wound Description (Comments): This is patchy areas of full thickness skin loss related to moisture associated skin damage, NOT a pressure injury  Present on Admission: No     Pressure Injury 03/27/2021  Sacrum Medial Stage 1 -  Intact skin with non-blanchable redness of a localized area usually over a bony prominence. non-blanchable (Active)  03/23/2021 1600  Location: Sacrum  Location Orientation: Medial  Staging: Stage 1 -  Intact skin with non-blanchable redness of a localized area usually over a bony prominence.  Wound Description (Comments): non-blanchable  Present on Admission: Yes         Patient BMI: Body mass index is 25.95 kg/m.   DVT prophylaxis: enoxaparin (LOVENOX) injection 40 mg Start: 04/03/21 1000   Diet:  Diet Orders (From admission, onward)     Start     Ordered   03/31/21 1044  Diet NPO time specified Except for: Sips with Meds  Diet effective now       Comments: Medication in puree  Question:  Except for  Answer:  Ferrel Logan with Meds   03/31/21 1044              Code Status: DNR   Brief Narrative / Hospital Course to Date:   Korby Ratay is a 65 year old male with rheumatoid arthritis, ALS with a trach on home vent, diabetes mellitus, hypertension. Presented due to oxygen desaturations into the 80s after nebulizer treatments.  In the ED, patient was febrile with  temp 101 F, tachypneic and mildly tachycardic, with leukocytosis on CBC and  hypokalemia.  Anasarca noted on exam.  CT scan of the chest showed extensive left lung opacities and debris in the mainstem bronchus.    Started on vancomycin and cefepime for sepsis secondary to pneumonia.  Admitted to [facility] service.    Pulmonology consulted and following.      Subjective 04/04/21    Patient sleeping, wife at bedside. He does not respond to voice and sternal rub today but does move eyebrows.  HR's have been up, increased Coreg this AM and his BP dropped but recovered.  Sinus tach on monitor.   Disposition Plan & Communication   Status is: Inpatient  Remains inpatient appropriate because:Ongoing active pain requiring inpatient pain management and IV treatments appropriate due to intensity of  illness or inability to take PO  Dispo: The patient is from: Home              Anticipated d/c is to: Home              Patient currently is not medically stable to d/c.   Difficult to place patient No  Family Communication: wife at bedside on rounds today 6/17   Consults, Procedures, Significant Events   Consultants:  PCCM Interventional radiology Neurology Palliative care  Procedures:  G-tube placement 6/15 Bronchoscopy  Antimicrobials:  Anti-infectives (From admission, onward)    Start     Dose/Rate Route Frequency Ordered Stop   04/02/21 1540  ceFAZolin (ANCEF) IVPB 2g/100 mL premix        over 30 Minutes Intravenous Continuous PRN 04/02/21 1548 04/02/21 1540   04/01/21 1230  Ampicillin-Sulbactam (UNASYN) 3 g in sodium chloride 0.9 % 100 mL IVPB        3 g 200 mL/hr over 30 Minutes Intravenous Every 8 hours 04/01/21 1140 04/09/21 0559   03/31/21 2300  ceFEPIme (MAXIPIME) 2 g in sodium chloride 0.9 % 100 mL IVPB  Status:  Discontinued        2 g 200 mL/hr over 30 Minutes Intravenous Every 8 hours 03/31/21 1542 04/01/21 1044   03/31/21 2200  vancomycin (VANCOREADY) IVPB 750 mg/150 mL  Status:  Discontinued        750 mg 150 mL/hr over 60 Minutes Intravenous Every 12 hours 03/31/21 1143 04/01/21 1044   03/31/21 1045  vancomycin (VANCOREADY) IVPB 1250 mg/250 mL        1,250 mg 166.7 mL/hr over 90 Minutes Intravenous  Once 03/31/21 0957 03/31/21 1302   03/30/2021 1700  vancomycin (VANCOREADY) IVPB 1000 mg/200 mL  Status:  Discontinued        1,000 mg 200 mL/hr over 60 Minutes Intravenous Every 12 hours 03/23/2021 0943 04/03/2021 1115   04/16/2021 1300  ceFEPIme (MAXIPIME) 2 g in sodium chloride 0.9 % 100 mL IVPB  Status:  Discontinued        2 g 200 mL/hr over 30 Minutes Intravenous Every 8 hours 03/28/2021 0833 03/31/21 1542   04/01/2021 1300  vancomycin (VANCOREADY) IVPB 1500 mg/300 mL  Status:  Discontinued        1,500 mg 150 mL/hr over 120 Minutes Intravenous Every 8 hours  03/22/2021 0941 03/25/2021 0943   03/22/2021 0530  vancomycin (VANCOREADY) IVPB 1000 mg/200 mL  Status:  Discontinued        1,000 mg 200 mL/hr over 60 Minutes Intravenous  Once 04/15/2021 0517 04/06/2021 0518   04/01/2021 0530  ceFEPIme (MAXIPIME) 2 g in sodium chloride 0.9 %  100 mL IVPB        2 g 200 mL/hr over 30 Minutes Intravenous  Once 03/21/2021 0517 03/22/2021 0549   04/01/2021 0530  vancomycin (VANCOREADY) IVPB 1250 mg/250 mL        1,250 mg 166.7 mL/hr over 90 Minutes Intravenous  Once 04/12/2021 0518 04/02/2021 0728         Micro    Objective   Vitals:   04/04/21 1400 04/04/21 1430 04/04/21 1500 04/04/21 1525  BP: (!) 150/82 (!) 144/76    Pulse: (!) 101 (!) 103    Resp: 16 16    Temp:   98.7 F (37.1 C)   TempSrc:      SpO2: 96% 96%  95%  Weight:      Height:        Intake/Output Summary (Last 24 hours) at 04/04/2021 1603 Last data filed at 04/04/2021 1300 Gross per 24 hour  Intake 2111.78 ml  Output 1850 ml  Net 261.78 ml   Filed Weights   03/31/2021 0323 03/30/21 0600 04/04/21 0500  Weight: 68 kg 79.7 kg 79.7 kg    Physical Exam:  General exam: sleeping comfortably, does not open eyes to stimulation today, no acute distress, nonverbal HEENT: trach in place on vent Respiratory system: CTAB anteriorly and laterally, no wheezes, normal respiratory effort. Cardiovascular system: tachycardic, regular rhythm, 2+ DP pulses b/l, sinus tachycardia on monitor Gastrointestinal system: soft, NT, G tube in place. Extremities: edema of all extremities, pedal edema improving with skin wrinkling noted, offloading boots on b/l feet    Labs   Data Reviewed: I have personally reviewed following labs and imaging studies  CBC: Recent Labs  Lab 04/07/2021 0515 03/27/2021 1038 03/31/21 0350 04/01/21 1816 04/01/21 1831 04/02/21 0153 04/03/21 0037 04/04/21 0603 04/04/21 1114  WBC 17.0*   < > 12.1*  --  12.3* 12.7* 9.9 10.2  --   NEUTROABS 14.8*  --   --   --   --   --   --   --   --    HGB 14.3   < > 13.0   < > 13.3 12.8* 13.9 14.0 12.2*  HCT 45.5   < > 41.5   < > 43.7 40.6 44.4 42.6 36.0*  MCV 82.3   < > 84.5  --  85.9 83.7 83.8 79.3*  --   PLT 278   < > 255  --  279 235 238 249  --    < > = values in this interval not displayed.   Basic Metabolic Panel: Recent Labs  Lab 03/31/21 0350 04/01/21 0114 04/01/21 1814 04/01/21 1816 04/01/21 1831 04/03/21 0037 04/03/21 1402 04/03/21 1622 04/04/21 0603 04/04/21 1114  NA 139 139 145 147* 142 147*  --   --  147* 152*  K 2.8* 3.4* 4.1 3.9 4.0 3.3*  --   --  2.9* 2.8*  CL 106 109 112*  --  112* 118*  --   --  116*  --   CO2 17* 16* 16*  --  14* 16*  --   --  24  --   GLUCOSE 129* 128* 127*  --  134* 164*  --   --  323*  --   BUN _0 --  10 5*  --   --  6*  --   CREATININE 0.53* 0.55* 0.57*  --  0.58* 0.61  --   --  0.31*  --   CALCIUM 9.2 9.4 10.0  --  9.6 9.7  --   --  9.4  --   MG 2.1  --   --   --   --  2.0 1.9 2.0 2.2  --   PHOS  --   --   --   --   --   --  <1.0* <1.0* 1.1*  --    GFR: Estimated Creatinine Clearance: 89.6 mL/min (A) (by C-G formula based on SCr of 0.31 mg/dL (L)). Liver Function Tests: Recent Labs  Lab 03/30/2021 0515 04/01/21 1831  AST 16 9*  ALT 14 11  ALKPHOS 84 79  BILITOT 1.2 2.2*  PROT 8.1 7.6  ALBUMIN 3.1* 2.5*   No results for input(s): LIPASE, AMYLASE in the last 168 hours. Recent Labs  Lab 04/03/2021 1530  AMMONIA 11   Coagulation Profile: Recent Labs  Lab 03/27/2021 0515  INR 1.3*   Cardiac Enzymes: No results for input(s): CKTOTAL, CKMB, CKMBINDEX, TROPONINI in the last 168 hours. BNP (last 3 results) No results for input(s): PROBNP in the last 8760 hours. HbA1C: No results for input(s): HGBA1C in the last 72 hours. CBG: Recent Labs  Lab 04/03/21 2330 04/04/21 0403 04/04/21 0736 04/04/21 1057 04/04/21 1511  GLUCAP 291* 323* 270* 283* 396*   Lipid Profile: Recent Labs    04/02/21 0831  CHOL 86  HDL 28*  LDLCALC 45  TRIG 66  CHOLHDL 3.1    Thyroid Function Tests: No results for input(s): TSH, T4TOTAL, FREET4, T3FREE, THYROIDAB in the last 72 hours. Anemia Panel: No results for input(s): VITAMINB12, FOLATE, FERRITIN, TIBC, IRON, RETICCTPCT in the last 72 hours. Sepsis Labs: Recent Labs  Lab 03/21/2021 0519 03/20/2021 1229 04/01/21 1831  PROCALCITON  --  1.17  --   LATICACIDVEN 1.6  --  0.9    Recent Results (from the past 240 hour(s))  Urine culture     Status: Abnormal   Collection Time: 03/31/2021  3:35 AM   Specimen: In/Out Cath Urine  Result Value Ref Range Status   Specimen Description IN/OUT CATH URINE  Final   Special Requests   Final    NONE Performed at [facility], [address] [place], [place] [address]    Culture MULTIPLE SPECIES PRESENT, SUGGEST RECOLLECTION (A)  Final   Report Status 03/30/2021 FINAL  Final  Resp Panel by RT-PCR (Flu A&B, Covid) Nasopharyngeal Swab     Status: None   Collection Time: 03/27/2021  3:52 AM   Specimen: Nasopharyngeal Swab; Nasopharyngeal(NP) swabs in vial transport medium  Result Value Ref Range Status   SARS Coronavirus 2 by RT PCR NEGATIVE NEGATIVE Final    Comment: (NOTE) SARS-CoV-2 target nucleic acids are NOT DETECTED.  The SARS-CoV-2 RNA is generally detectable in upper respiratory specimens during the acute phase of infection. The lowest concentration of SARS-CoV-2 viral copies this assay can detect is 138 copies/mL. A negative result does not preclude SARS-Cov-2 infection and should not be used as the sole basis for treatment or other patient management decisions. A negative result may occur with  improper specimen collection/handling, submission of specimen other than nasopharyngeal swab, presence of viral mutation(s) within the areas targeted by this assay, and inadequate number of viral copies(<138 copies/mL). A negative result must be combined with clinical observations, patient history, and epidemiological information. The expected result is  Negative.  Fact Sheet for Patients:  EntrepreneurPulse.com.au  Fact Sheet for Healthcare Providers:  IncredibleEmployment.be  This test is no t yet approved or cleared by the [place] FDA  and  has been authorized for detection and/or diagnosis of SARS-CoV-2 by FDA under an Emergency Use Authorization (EUA). This EUA will remain  in effect (meaning this test can be used) for the duration of the COVID-19 declaration under Section 564(b)(1) of the Act, 21 U.S.C.section 360bbb-3(b)(1), unless the authorization is terminated  or revoked sooner.       Influenza A by PCR NEGATIVE NEGATIVE Final   Influenza B by PCR NEGATIVE NEGATIVE Final    Comment: (NOTE) The Xpert Xpress SARS-CoV-2/FLU/RSV plus assay is intended as an aid in the diagnosis of influenza from Nasopharyngeal swab specimens and should not be used as a sole basis for treatment. Nasal washings and aspirates are unacceptable for Xpert Xpress SARS-CoV-2/FLU/RSV testing.  Fact Sheet for Patients: EntrepreneurPulse.com.au  Fact Sheet for Healthcare Providers: IncredibleEmployment.be  This test is not yet approved or cleared by the [place] FDA and has been authorized for detection and/or diagnosis of SARS-CoV-2 by FDA under an Emergency Use Authorization (EUA). This EUA will remain in effect (meaning this test can be used) for the duration of the COVID-19 declaration under Section 564(b)(1) of the Act, 21 U.S.C. section 360bbb-3(b)(1), unless the authorization is terminated or revoked.  Performed at [facility], [place] [address] [place], [place] [address]   Blood Culture (routine x 2)     Status: Abnormal   Collection Time: 03/30/2021  5:08 AM   Specimen: BLOOD  Result Value Ref Range Status   Specimen Description   Final    BLOOD LEFT ANTECUBITAL Performed at [facility], [address] [place], [place] [address]     Special Requests   Final    BOTTLES DRAWN AEROBIC AND ANAEROBIC Blood Culture adequate volume Performed at [facility], [address] [place], [place] [address]    Culture  Setup Time   Final    GRAM POSITIVE COCCI IN CLUSTERS IN BOTH AEROBIC AND ANAEROBIC BOTTLES CRITICAL RESULT CALLED TO, READ BACK BY AND VERIFIED WITH: V. BRYK,PHARMD 0548 03/30/2021 T. TYSOR    Culture (A)  Final    STAPHYLOCOCCUS CAPITIS THE SIGNIFICANCE OF ISOLATING THIS ORGANISM FROM A SINGLE SET OF BLOOD CULTURES WHEN MULTIPLE SETS ARE DRAWN IS UNCERTAIN. PLEASE NOTIFY THE MICROBIOLOGY DEPARTMENT WITHIN ONE WEEK IF SPECIATION AND SENSITIVITIES ARE REQUIRED. Performed at [facility], [place] [address], [place], [place] [address]    Report Status 04/01/2021 FINAL  Final  Blood Culture ID Panel (Reflexed)     Status: Abnormal   Collection Time: 04/09/2021  5:08 AM  Result Value Ref Range Status   Enterococcus faecalis NOT DETECTED NOT DETECTED Final   Enterococcus Faecium NOT DETECTED NOT DETECTED Final   Listeria monocytogenes NOT DETECTED NOT DETECTED Final   Staphylococcus species DETECTED (A) NOT DETECTED Final    Comment: CRITICAL RESULT CALLED TO, READ BACK BY AND VERIFIED WITH: VBeecher Mcardle 2423 03/30/2021 T. TYSOR    Staphylococcus aureus (BCID) NOT DETECTED NOT DETECTED Final   Staphylococcus epidermidis NOT DETECTED NOT DETECTED Final   Staphylococcus lugdunensis NOT DETECTED NOT DETECTED Final   Streptococcus species NOT DETECTED NOT DETECTED Final   Streptococcus agalactiae NOT DETECTED NOT DETECTED Final   Streptococcus pneumoniae NOT DETECTED NOT DETECTED Final   Streptococcus pyogenes NOT DETECTED NOT DETECTED Final   A.calcoaceticus-baumannii NOT DETECTED NOT DETECTED Final   Bacteroides fragilis NOT DETECTED NOT DETECTED Final   Enterobacterales NOT DETECTED NOT DETECTED Final   Enterobacter cloacae complex NOT  DETECTED NOT DETECTED Final   Escherichia coli NOT DETECTED NOT  DETECTED Final   Klebsiella aerogenes NOT DETECTED NOT DETECTED Final   Klebsiella oxytoca NOT DETECTED NOT DETECTED Final   Klebsiella pneumoniae NOT DETECTED NOT DETECTED Final   Proteus species NOT DETECTED NOT DETECTED Final   Salmonella species NOT DETECTED NOT DETECTED Final   Serratia marcescens NOT DETECTED NOT DETECTED Final   Haemophilus influenzae NOT DETECTED NOT DETECTED Final   Neisseria meningitidis NOT DETECTED NOT DETECTED Final   Pseudomonas aeruginosa NOT DETECTED NOT DETECTED Final   Stenotrophomonas maltophilia NOT DETECTED NOT DETECTED Final   Candida albicans NOT DETECTED NOT DETECTED Final   Candida auris NOT DETECTED NOT DETECTED Final   Candida glabrata NOT DETECTED NOT DETECTED Final   Candida krusei NOT DETECTED NOT DETECTED Final   Candida parapsilosis NOT DETECTED NOT DETECTED Final   Candida tropicalis NOT DETECTED NOT DETECTED Final   Cryptococcus neoformans/gattii NOT DETECTED NOT DETECTED Final    Comment: Performed at [facility], [place] [address] [place], [place] [address]  Expectorated Sputum Assessment w Gram Stain, Rflx to Resp Cult     Status: None   Collection Time: 04/08/2021  8:31 AM   Specimen: Expectorated Sputum  Result Value Ref Range Status   Specimen Description EXPECTORATED SPUTUM  Final   Special Requests NONE  Final   Sputum evaluation   Final    THIS SPECIMEN IS ACCEPTABLE FOR SPUTUM CULTURE Performed at [facility], [place] [address] [place], [place] [address]    Report Status 04/15/2021 FINAL  Final  MRSA Next Gen by PCR, Nasal     Status: None   Collection Time: 03/21/2021  8:31 AM  Result Value Ref Range Status   MRSA by PCR Next Gen NOT DETECTED NOT DETECTED Final    Comment: (NOTE) The GeneXpert MRSA Assay (FDA approved for NASAL specimens only), is one component of a comprehensive MRSA colonization surveillance program. It is not intended to diagnose MRSA infection nor to guide or monitor treatment for MRSA  infections. Test performance is not FDA approved in patients less than 16 years old. Performed at [facility], [place] [address] [place], [place] [address]   Culture, Respiratory w Gram Stain     Status: None   Collection Time: 03/20/2021  8:31 AM  Result Value Ref Range Status   Specimen Description EXPECTORATED SPUTUM  Final   Special Requests NONE Reflexed from [id number]  Final   Gram Stain   Final    ABUNDANT WBC PRESENT, PREDOMINANTLY PMN RARE GRAM POSITIVE COCCI Performed at [facility], [place] [address] [place], [place] [address]    Culture   Final    RARE STAPHYLOCOCCUS AUREUS RARE PROTEUS MIRABILIS    Report Status 04/01/2021 FINAL  Final   Organism ID, Bacteria STAPHYLOCOCCUS AUREUS  Final   Organism ID, Bacteria PROTEUS MIRABILIS  Final      Susceptibility   Proteus mirabilis - MIC*    AMPICILLIN <=2 SENSITIVE Sensitive     CEFAZOLIN <=4 SENSITIVE Sensitive     CEFEPIME <=0.12 SENSITIVE Sensitive     CEFTAZIDIME <=1 SENSITIVE Sensitive     CEFTRIAXONE <=0.25 SENSITIVE Sensitive     CIPROFLOXACIN <=0.25 SENSITIVE Sensitive     GENTAMICIN <=1 SENSITIVE Sensitive     IMIPENEM 8 INTERMEDIATE Intermediate     TRIMETH/SULFA <=20 SENSITIVE Sensitive     AMPICILLIN/SULBACTAM <=2 SENSITIVE Sensitive     PIP/TAZO <=4 SENSITIVE  Sensitive     * RARE PROTEUS MIRABILIS   Staphylococcus aureus - MIC*    CIPROFLOXACIN <=0.5 SENSITIVE Sensitive     ERYTHROMYCIN <=0.25 SENSITIVE Sensitive     GENTAMICIN <=0.5 SENSITIVE Sensitive     OXACILLIN 0.5 SENSITIVE Sensitive     TETRACYCLINE >=16 RESISTANT Resistant     VANCOMYCIN 1 SENSITIVE Sensitive     TRIMETH/SULFA <=10 SENSITIVE Sensitive     CLINDAMYCIN <=0.25 SENSITIVE Sensitive     RIFAMPIN <=0.5 SENSITIVE Sensitive     Inducible Clindamycin NEGATIVE Sensitive     * RARE STAPHYLOCOCCUS AUREUS  Blood Culture (routine x 2)     Status: None   Collection Time: 04/10/2021  9:38 AM   Specimen: BLOOD RIGHT HAND  Result Value  Ref Range Status   Specimen Description BLOOD RIGHT HAND  Final   Special Requests   Final    BOTTLES DRAWN AEROBIC AND ANAEROBIC Blood Culture adequate volume   Culture   Final    NO GROWTH 5 DAYS Performed at [facility], [address], [place], [place] [address]    Report Status 04/03/2021 FINAL  Final  Culture, Respiratory w Gram Stain     Status: None (Preliminary result)   Collection Time: 04/02/21  2:05 PM   Specimen: Tracheal Aspirate; Respiratory  Result Value Ref Range Status   Specimen Description TRACHEAL ASPIRATE  Final   Special Requests NONE  Final   Gram Stain   Final    ABUNDANT WBC PRESENT,BOTH PMN AND MONONUCLEAR FEW GRAM NEGATIVE RODS FEW GRAM VARIABLE ROD RARE YEAST    Culture   Final    RARE PSEUDOMONAS AERUGINOSA CULTURE REINCUBATED FOR BETTER GROWTH Performed at [facility], [place] [address], [place], [place] [address]    Report Status PENDING  Incomplete      Imaging Studies   IR GASTROSTOMY TUBE MOD SED  Result Date: 04/02/2021 INDICATION: 65 year old gentleman with history of stroke and dysphagia presents to IR for percutaneous gastrostomy tube placement. EXAM: Fluoroscopy guided percutaneous gastrostomy tube placement MEDICATIONS: Ancef 2 g IV; Antibiotics were administered within 1 hour of the procedure. Glucagon 1 mg IV ANESTHESIA/SEDATION: Versed 1 mg IV; Fentanyl 50 mcg IV Moderate Sedation Time:  9 minutes The patient was continuously monitored during the procedure by the interventional radiology nurse under my direct supervision. CONTRAST:  20 mL of Omnipaque 300-administered into the gastric lumen. FLUOROSCOPY TIME:  Fluoroscopy Time: 2 minutes 0 seconds (7 mGy). COMPLICATIONS: None immediate. PROCEDURE: Informed written consent was obtained from the patient's wife after a thorough discussion of the procedural risks, benefits and alternatives. All questions were addressed. Maximal Sterile Barrier Technique was utilized including caps,  mask, sterile gowns, sterile gloves, sterile drape, hand hygiene and skin antiseptic. A timeout was performed prior to the initiation of the procedure. An orogastric tube was placed with fluoroscopic guidance. The anterior abdomen was prepped and draped in sterile fashion. Ultrasound evaluation of the left upper quadrant was performed to confirm the position of the liver. The skin and subcutaneous tissues were anesthetized with 1% lidocaine. Single gastropexy was placed. 17 gauge needle was directed into the distended stomach with fluoroscopic guidance. A wire was advanced into the stomach. 9-French vascular sheath was placed and the orogastric tube was snared using a Gooseneck snare device. The orogastric tube and snare were pulled out of the patient's mouth. The snare device was connected to a 20-French gastrostomy tube. The snare device and gastrostomy tube were pulled through  the patient's mouth and out the anterior abdominal wall. The gastrostomy tube was cut to an appropriate length. Contrast injection through gastrostomy tube confirmed placement within the stomach. G tube was flushed and covered with sterile dressing. IMPRESSION: 20 French gastrostomy tube placed utilizing fluoroscopic guidance. Electronically Signed   By: Miachel Roux M.D.   On: 04/02/2021 16:19   VAS [place] UPPER EXTREMITY VENOUS DUPLEX  Result Date: 04/04/2021 UPPER VENOUS STUDY  Patient Name:  CIRILO CANNER  Date of Exam:   04/04/2021 Medical Rec #: [id number]        Accession #:    [id number] Date of Birth: 1956/08/21        Patient Gender: M Patient Age:   65Y Exam Location:  [facility] Procedure:      VAS [place] UPPER EXTREMITY VENOUS DUPLEX Referring Phys: [id number] Odyn Turko A Odeth Bry --------------------------------------------------------------------------------  Indications: Edema, and Erythema Limitations: Line and bandages. Comparison Study: no prior Performing Technologist: Archie Patten RVS  Examination Guidelines: A  complete evaluation includes B-mode imaging, spectral Doppler, color Doppler, and power Doppler as needed of all accessible portions of each vessel. Bilateral testing is considered an integral part of a complete examination. Limited examinations for reoccurring indications may be performed as noted.  Right Findings: +----------+------------+---------+-----------+----------+--------------+ RIGHT     CompressiblePhasicitySpontaneousProperties   Summary     +----------+------------+---------+-----------+----------+--------------+ IJV                                                 Not visualized +----------+------------+---------+-----------+----------+--------------+ Subclavian    Full       Yes       Yes                             +----------+------------+---------+-----------+----------+--------------+ Axillary      Full       Yes       Yes                             +----------+------------+---------+-----------+----------+--------------+ Brachial      Full       Yes       Yes                             +----------+------------+---------+-----------+----------+--------------+ Radial        Full                                                 +----------+------------+---------+-----------+----------+--------------+ Ulnar         Full                                                 +----------+------------+---------+-----------+----------+--------------+ Cephalic      Full                                                 +----------+------------+---------+-----------+----------+--------------+  Basilic       Full                                                 +----------+------------+---------+-----------+----------+--------------+  Left Findings: +----------+------------+---------+-----------+----------+-------+ LEFT      CompressiblePhasicitySpontaneousPropertiesSummary +----------+------------+---------+-----------+----------+-------+  Subclavian    Full       Yes       Yes                      +----------+------------+---------+-----------+----------+-------+  Summary:  Right: No evidence of deep vein thrombosis in the upper extremity. No evidence of superficial vein thrombosis in the upper extremity. No evidence of thrombosis in the subclavian.  Left: No evidence of thrombosis in the subclavian.  *See table(s) above for measurements and observations.     Preliminary      Medications   Scheduled Meds:  aspirin  81 mg Per Tube Daily   chlorhexidine gluconate (MEDLINE KIT)  15 mL Mouth Rinse BID   Chlorhexidine Gluconate Cloth  6 each Topical Daily   enoxaparin (LOVENOX) injection  40 mg Subcutaneous Q24H   feeding supplement (PROSource TF)  45 mL Per Tube BID   folic acid  1 mg Intravenous Daily   free water  200 mL Per Tube Q4H   furosemide  20 mg Per Tube Daily   hydrocortisone cream   Topical TID   insulin aspart  0-9 Units Subcutaneous Q4H   insulin glargine  10 Units Subcutaneous Daily   ipratropium-albuterol  3 mL Nebulization TID   latanoprost  1 drop Both Eyes QHS   LORazepam  0.5 mg Per Tube QHS   mouth rinse  15 mL Mouth Rinse 10 times per day   metoprolol tartrate  25 mg Per Tube BID   PARoxetine  20 mg Per Tube q morning   polyvinyl alcohol  1 drop Both Eyes TID   senna-docusate  1 tablet Per Tube QHS   sodium bicarbonate  1,300 mg Per Tube TID   Continuous Infusions:  ampicillin-sulbactam (UNASYN) IV 3 g (04/04/21 1429)   famotidine (PEPCID) IV Stopped (04/04/21 1030)   feeding supplement (OSMOLITE 1.5 CAL) 1,000 mL (04/04/21 1441)   potassium PHOSPHATE IVPB (in mmol) 85 mL/hr at 04/04/21 1300       LOS: 6 days    Time spent: 42 minutes with greater than 50% spent at bedside and in coordination of care.    Ezekiel Slocumb, DO [facility]  04/04/2021, 4:03 PM      If 7PM-7AM, please contact night-coverage. How to contact the [facility] Attending or Consulting provider [place]  or covering provider during after hours [place], for this patient?    Check the care team in [facility] and look for a) attending/consulting TR[facility] provider listed and b) the [facility] team listed Log into [email or web address] and use [place]'s universal password to access. If you do not have the password, please contact the hospital operator. Locate the [facility] provider you are looking for under [facility] and page to a number that you can be directly reached. If you still have difficulty reaching the provider, please page the [facility] (Director on Call) for the Hospitalists listed on amion for assistance.

## 2021-04-04 NOTE — Progress Notes (Signed)
NAME:  Wayne Young, MRN:  [id number], DOB:  06/29/1956, LOS: 6 ADMISSION DATE:  2021-04-27, CONSULTATION DATE: 2021/04/27 REFERRING MD:  Dr. Jacqulyn Bath, CHIEF COMPLAINT: Hypoxic respiratory failure  History of Present Illness:  HPI obtained from medical chart review as patient is mechanically ventilated via trach.  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  Patient has home health nurse who noted increased work of breathing and hypoxemia which began overnight.  Has been having increased secretions since Wednesday.  She had been suctioning them, was suctioned without secretions and given breathing treatment with minimal increase in O2.   In fact after the most recent suctioning home nurse noted that his oxygen saturations went down significantly and thus called EMS.  On EMS arrival, O2 sats in the 80s which increased to the 90s on 6 L from usually 5 L bled it.  Home health nurse also reported improving lower leg edema.  This is been improving over the last few months.  In ER, noted to be febrile at 101, minimally tachycardic at 106 and normotensive.  Labs significant for WBC 17, K 2.8, low BUN 7 and sCr 0.32, albumin 3.1, normal lactate, INR 1.3, SARS/ flu neg, UA cloudy with large leukocytes and pyuria.  CXR noted for confluent masslike opacities in the left mid lung, most suspicious for pneumonia, but underlying mass not excluded.  Blood and urine cultures sent and empirically started on vancomycin and cefepime.  TRH to admit, PCCM consulted for chronic vent management.    Pertinent  Medical History  ALS w/trach on home vent, HTN, DM, arthritis, glaucoma  Significant Hospital Events: Including procedures, antibiotic start and stop dates in addition to other pertinent events   Admit to [facility] with sepsis, pneumonia/ UTI. Vanc and  cefepime started.  6/14 Sputum growing proteus mirabilis, rare staph; abx changed to Unasyn 6/14 code stroke called and then cancelled for new pupil size discrepancy L>R and L pupil with new nonreactivity to light, evaluated by neuro and received brain MRI which showed punctate area of ischemia in superior left parietal lobe, not candidate for tPA, pt at baseline mental status 6/14 palliative care following, code status discussions. Made DNR 6/15 PEG placed. FOB for complete opacification of left hemithorax. Sputum culture repeated.  6/17 cuff leak from vent. Checked abg and f/u cxr   Interim History / Subjective:  Lethargic no real change otherwise   Objective   Blood pressure (Abnormal) 169/89, pulse (Abnormal) 116, temperature (Abnormal) 97.4 F (36.3 C), resp. rate 16, height 5\' 9"  (1.753 m), weight 79.7 kg, SpO2 90 %.    Vent Mode: PRVC FiO2 (%):  [40 %] 40 % Set Rate:  [16 bmp] 16 bmp Vt Set:  [520 mL-560 mL] 560 mL PEEP:  [5 cmH20] 5 cmH20 Plateau Pressure:  [15 cmH20-21 cmH20] 15 cmH20   Intake/Output Summary (Last 24 hours) at 04/04/2021 0844 Last data filed at 04/04/2021 04/06/2021 Gross per 24 hour  Intake 1852.63 ml  Output 2450 ml  Net -597.37 ml   Filed Weights   27-Apr-2021 0323 03/30/21 0600 04/04/21 0500  Weight: 68 kg 79.7 kg 79.7 kg    Examination: General: 65 year old black male remains on ventilatory support HEENT normocephalic atraumatic tracheostomy unremarkable with exception of mild air leak which is audible at bedside Pulmonary: Decreased bases, no accessory use.  Writing mechanical ventilator set  rate.  Losing about 100 cc tidal volume with air leak Cardiac: Regular rate and rhythm Extremities: Warm dry diffuse anasarca Neuro: Opens eyes, does not move extremities GU: Clear yellow.   Labs/imaging that I havepersonally reviewed  (right click and "Reselect all SmartList Selections" daily)  Addressed below if applicable  Resolved Hospital Problem list     Assessment & Plan:   Acute on chronic hypoxic respiratory failure 2/2 ALS & Left PNA with sputum culture growing both methicillin sensitive staph and pansensitive Proteus Ventilator and Trach dependent (POA) Mucous plugging and atelectasis  Sepsis  DM w/ hyperglycemia Dysphagia now s/p PEG Fluid and electrolyte imbalance: hypophosphatemia, hypernatremia, hyperchloremia, hypokalemia     Pulm problem list   Acute on chronic hypoxic respiratory failure 2/2 ALS & Left PNA with sputum culture growing both methicillin sensitive staph and pansensitive Proteus Ventilator and Trach dependent (POA) Mucous plugging and atelectasis  home mini vent with [employer] (Trilogy label on bottom).  Using AVAPS mode.  PEEP 6, respiratory rate 20, PIP 20  Currently on PRVC Last chest x-ray on 6/15: Demonstrated left sided airspace disease much worse than right, this did reflect some improvement in prior x-ray where he had complete opacification of the left side Has mild air leak losing about 100 cc tidal volume from tracheostomy Plan Continue full ventilator support, no benefit to weaning RASS goal 0 He is currently on Unasyn, internal medicine service managing.  Today is hospital day #5, has some baseline immunocompromise state, would be reasonable to extend to a 8 to 10-day course Continue aggressive pulmonary hygiene including hypertonic saline nebulizers Continue DuoNeb 3 times daily N.p.o. Repeat chest x-ray today Checking arterial blood gas, if significantly hypercarbic with this air leak we might need to consider changing to a distal XLT trach Would make sure that as we get closer to discharge he has at least 24 to 48 hours on his home ventilator  All other issues as directed by internal medicine, I fully agree with the ongoing support from palliative.  Best Practice (right click and "Reselect all SmartList Selections" daily)   Diet/type: tubefeeds Pain/Anxiety/Delirium protocol  Yes and RASS goal: 0 VAP protocol (if indicated): Yes DVT prophylaxis: LMWH GI prophylaxis: H2B Glucose control:  SSI and Basal coverage Central venous access:  N/A Arterial line:  N/A Foley:  Yes, and it is still needed Mobility:  bed rest  PT consulted: N/A Studies pending: XRAY Culture data pending:sputum Last reviewed culture data:today Antibiotics:unasyn Antibiotic de-escalation: no,  continue current rx Stop date: to be determined  Daily labs:  per primary  Code Status:  DNR Last date of multidisciplinary goals of care discussion [per primary ] Not life-threatening Disposition: Continue current level of care         Critical care time: n/a   Simonne Martinet ACNP-BC [facility] Pulmonary/Critical Care Pager # [phone] OR # [phone] if no answer

## 2021-04-05 ENCOUNTER — Inpatient Hospital Stay (HOSPITAL_COMMUNITY): Payer: Medicare HMO

## 2021-04-05 DIAGNOSIS — Z515 Encounter for palliative care: Secondary | ICD-10-CM | POA: Diagnosis not present

## 2021-04-05 DIAGNOSIS — G1221 Amyotrophic lateral sclerosis: Secondary | ICD-10-CM | POA: Diagnosis not present

## 2021-04-05 LAB — GLUCOSE, CAPILLARY
Glucose-Capillary: 326 mg/dL — ABNORMAL HIGH (ref 70–99)
Glucose-Capillary: 295 mg/dL — ABNORMAL HIGH (ref 70–99)
Glucose-Capillary: 310 mg/dL — ABNORMAL HIGH (ref 70–99)
Glucose-Capillary: 303 mg/dL — ABNORMAL HIGH (ref 70–99)
Glucose-Capillary: 354 mg/dL — ABNORMAL HIGH (ref 70–99)

## 2021-04-05 LAB — BASIC METABOLIC PANEL
Anion gap: 7 (ref 5–15)
BUN: 6 mg/dL — ABNORMAL LOW (ref 8–23)
CO2: 32 mmol/L (ref 22–32)
Calcium: 9 mg/dL (ref 8.9–10.3)
Chloride: 109 mmol/L (ref 98–111)
Creatinine, Ser: <0.3 mg/dL — ABNORMAL LOW (ref 0.61–1.24)
Glucose, Bld: 330 mg/dL — ABNORMAL HIGH (ref 70–99)
Potassium: 3.5 mmol/L (ref 3.5–5.1)
Sodium: 148 mmol/L — ABNORMAL HIGH (ref 135–145)

## 2021-04-05 LAB — CBC
HCT: 38.8 % — ABNORMAL LOW (ref 39.0–52.0)
Hemoglobin: 12.8 g/dL — ABNORMAL LOW (ref 13.0–17.0)
MCH: 26 pg (ref 26.0–34.0)
MCHC: 33 g/dL (ref 30.0–36.0)
MCV: 78.9 fL — ABNORMAL LOW (ref 80.0–100.0)
Platelets: 238 10*3/uL (ref 150–400)
RBC: 4.92 MIL/uL (ref 4.22–5.81)
RDW: 18.2 % — ABNORMAL HIGH (ref 11.5–15.5)
WBC: 10.4 10*3/uL (ref 4.0–10.5)
nRBC: 0.2 % (ref 0.0–0.2)

## 2021-04-05 MED ORDER — FUROSEMIDE 10 MG/ML IJ SOLN
20.0000 mg | Freq: Once | INTRAMUSCULAR | Status: AC
  Administered 2021-04-05: 20 mg via INTRAVENOUS
  Filled 2021-04-05: qty 2

## 2021-04-05 MED ORDER — FUROSEMIDE 10 MG/ML IJ SOLN
INTRAMUSCULAR | Status: AC
  Filled 2021-04-05: qty 2

## 2021-04-05 MED ORDER — METOCLOPRAMIDE HCL 5 MG/ML IJ SOLN
10.0000 mg | Freq: Three times a day (TID) | INTRAMUSCULAR | Status: DC
  Administered 2021-04-05 – 2021-04-11 (×16): 10 mg via INTRAVENOUS
  Filled 2021-04-05 (×16): qty 2

## 2021-04-05 MED ORDER — SODIUM CHLORIDE 0.9 % IV SOLN
1.0000 g | Freq: Three times a day (TID) | INTRAVENOUS | Status: AC
  Administered 2021-04-05 – 2021-04-09 (×15): 1 g via INTRAVENOUS
  Filled 2021-04-05 (×15): qty 1

## 2021-04-05 MED ORDER — FUROSEMIDE 40 MG PO TABS: 40.0000 mg | ORAL_TABLET | Freq: Every day | ORAL | Status: DC

## 2021-04-05 MED ORDER — INSULIN GLARGINE 100 UNIT/ML ~~LOC~~ SOLN
20.0000 [IU] | Freq: Every day | SUBCUTANEOUS | Status: DC
  Administered 2021-04-06: 20 [IU] via SUBCUTANEOUS
  Filled 2021-04-05 (×2): qty 0.2

## 2021-04-05 MED ORDER — AQUAPHOR EX OINT
TOPICAL_OINTMENT | Freq: Every day | CUTANEOUS | Status: DC | PRN
  Filled 2021-04-05: qty 50

## 2021-04-05 MED ORDER — FUROSEMIDE 40 MG PO TABS: 40.0000 mg | ORAL_TABLET | Freq: Once | ORAL | Status: DC

## 2021-04-05 MED ORDER — POTASSIUM CHLORIDE 20 MEQ PO PACK
40.0000 meq | PACK | Freq: Once | ORAL | Status: AC
  Administered 2021-04-05: 40 meq
  Filled 2021-04-05: qty 2

## 2021-04-05 MED ORDER — CHLORHEXIDINE GLUCONATE CLOTH 2 % EX PADS: 6.0000 | MEDICATED_PAD | Freq: Every day | CUTANEOUS | Status: DC

## 2021-04-05 MED ORDER — INSULIN GLARGINE 100 UNIT/ML ~~LOC~~ SOLN
10.0000 [IU] | Freq: Once | SUBCUTANEOUS | Status: AC
  Administered 2021-04-05: 10 [IU] via SUBCUTANEOUS
  Filled 2021-04-05: qty 0.1

## 2021-04-05 NOTE — Progress Notes (Signed)
NAME:  Wayne Young, MRN:  [id number], DOB:  March 31, 1956, LOS: 7 ADMISSION DATE:  04/10/21, CONSULTATION DATE: 04-10-21 REFERRING MD:  Dr. Jacqulyn Bath, CHIEF COMPLAINT: Hypoxic respiratory failure  History of Present Illness:  HPI obtained from medical chart review as patient is mechanically ventilated via trach.  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  Patient has home health nurse who noted increased work of breathing and hypoxemia which began overnight.  Has been having increased secretions since Wednesday.  She had been suctioning them, was suctioned without secretions and given breathing treatment with minimal increase in O2.   In fact after the most recent suctioning home nurse noted that his oxygen saturations went down significantly and thus called EMS.  On EMS arrival, O2 sats in the 80s which increased to the 90s on 6 L from usually 5 L bled it.  Home health nurse also reported improving lower leg edema.  This is been improving over the last few months.  In ER, noted to be febrile at 101, minimally tachycardic at 106 and normotensive.  Labs significant for WBC 17, K 2.8, low BUN 7 and sCr 0.32, albumin 3.1, normal lactate, INR 1.3, SARS/ flu neg, UA cloudy with large leukocytes and pyuria.  CXR noted for confluent masslike opacities in the left mid lung, most suspicious for pneumonia, but underlying mass not excluded.  Blood and urine cultures sent and empirically started on vancomycin and cefepime.  TRH to admit, PCCM consulted for chronic vent management.    Significant Hospital Events: Including procedures, antibiotic start and stop dates in addition to other pertinent events   Admit to [facility] with sepsis, pneumonia/ UTI. Vanc and cefepime started.  6/14 Sputum growing proteus mirabilis, rare staph; abx changed to  Unasyn 6/14 code stroke called and then cancelled for new pupil size discrepancy L>R and L pupil with new nonreactivity to light, evaluated by neuro and received brain MRI which showed punctate area of ischemia in superior left parietal lobe, not candidate for tPA, pt at baseline mental status 6/14 palliative care following, code status discussions. Made DNR 6/15 PEG placed. FOB for complete opacification of left hemithorax. Sputum culture repeated.  6/17 cuff leak from vent. Checked abg and f/u cxr   Interim History / Subjective:  Started having thick secretions, blocking ETT leading to high peak pressures, hypoxemia and fever He remained hypertensive and encephalopathic  Respiratory cultures are growing MSSA and Pseudomonas  Developed extensive rash on the right upper extremity Objective   Blood pressure (!) 195/81, pulse (!) 118, temperature 99.5 F (37.5 C), temperature source Oral, resp. rate 16, height 5\' 9"  (1.753 m), weight 79.7 kg, SpO2 95 %.    Vent Mode: PRVC FiO2 (%):  [40 %-60 %] 60 % Set Rate:  [16 bmp] 16 bmp Vt Set:  [560 mL] 560 mL PEEP:  [5 cmH20] 5 cmH20 Plateau Pressure:  [14 cmH20-30 cmH20] 30 cmH20   Intake/Output Summary (Last 24 hours) at 04/05/2021 0825 Last data filed at 04/05/2021 0600 Gross per 24 hour  Intake 2836.35 ml  Output 2550 ml  Net 286.35 ml   Filed Weights   2021/04/10 0323 03/30/21 0600 04/04/21 0500  Weight: 68 kg 79.7 kg 79.7 kg    Examination:   Physical exam: General: Acute on chronically ill-appearing male s/p trach  HEENT: [place]/AT, eyes anicteric.  No  JVD Neuro: Eyes closed, does not open, not following commands Chest: Coarse crackles heard in left chest, no wheezes or rhonchi Heart: Tachycardic, regular rhythm, no murmurs or gallops Abdomen: Soft, nontender, nondistended, bowel sounds present Skin: No rash  Labs/imaging that I havepersonally reviewed  (right click and "Reselect all SmartList Selections" daily)  Addressed below  if applicable  Resolved Hospital Problem list    Assessment & Plan:   Acute on chronic hypoxic respiratory failure 2/2 ALS & Left PNA with sputum culture growing MSSA/Proteus/Pseudomonas  Ventilator and Trach dependent (POA) Mucous plugging and atelectasis  Sepsis due to extensive pneumonia DM w/ hyperglycemia Dysphagia now s/p PEG Fluid and electrolyte imbalance: hypophosphatemia, hypernatremia, hyperchloremia, hypokalemia   Uncontrolled hypertension   Patient required frequent suctioning overnight, he is having thick copious amount of yellowish/greenish secretions Respiratory cultures are growing MSSA/Proteus/Pseudomonas Switch antibiotic to meropenem Follow-up respiratory Cultures Vent Settings Were Adjusted to Help with Hypoxia and Now Improvement in Peak Pressures Continue aggressive pulmonary hygiene including hypertonic saline nebulizers Continue DuoNeb 3 times daily N.p.o. Repeat chest x-ray today Avoid sedation Continue to supplement electrolytes and monitor Recommend continuous goals of care discussion  All other issues as directed by internal medicine, I fully agree with the ongoing support from palliative.  Best Practice (right click and "Reselect all SmartList Selections" daily)   Diet/type: tubefeeds Pain/Anxiety/Delirium protocol Yes and RASS goal: 0 VAP protocol (if indicated): Yes DVT prophylaxis: LMWH GI prophylaxis: H2B Glucose control:  SSI and Basal coverage Central venous access:  N/A Arterial line:  N/A Foley:  Yes, and it is still needed Mobility:  bed rest  PT consulted: N/A Studies pending: XRAY Culture data pending:sputum Last reviewed culture data:today Antibiotics:unasyn Antibiotic de-escalation: no,  continue current rx Stop date: to be determined  Daily labs:  per primary  Code Status:  DNR Last date of multidisciplinary goals of care discussion [per primary ] Not life-threatening Disposition: Continue current level of care     Total critical care time: 33 minutes  Performed by: Cheri Fowler   Critical care time was exclusive of separately billable procedures and treating other patients.   Critical care was necessary to treat or prevent imminent or life-threatening deterioration.   Critical care was time spent personally by me on the following activities: development of treatment plan with patient and/or surrogate as well as nursing, discussions with consultants, evaluation of patient's response to treatment, examination of patient, obtaining history from patient or surrogate, ordering and performing treatments and interventions, ordering and review of laboratory studies, ordering and review of radiographic studies, pulse oximetry and re-evaluation of patient's condition.   Cheri Fowler MD  Pulmonary Critical Care See Amion for pager If no response to pager, please call [phone] until 7pm After 7pm, Please call E-link [phone]

## 2021-04-05 NOTE — Progress Notes (Signed)
RT NOTE: RT attempted to administer scheduled Duoneb with metaneb device. Upon placing metaneb device inline, patient desated into the 60's and HR bradyed into the 30's. RT removed metaneb and suctioned patient with HR improving into the 90's and sats increasing to 93%. RT placed nebulizer in aerogen and administered CPT through bed. Vitals are currently stable. RT will continue to monitor.

## 2021-04-05 NOTE — Progress Notes (Signed)
Daily Progress Note   Patient Name: Wayne Young       Date: 04/05/2021 DOB: [birth year]  Age: 65 y.o. MRN#: [id number] Attending Physician: Wayne Banter, DO Primary Care Physician: Wayne Aldo, NP Admit Date: 2021-04-22  Reason for Consultation/Follow-up: To discuss complex medical decision making related to patient's goals of care  Called by Dr. Denton Young.  She is concerned patient is actively declining and asked Palliative to follow up today.  Chart reviewed.  Events of this morning discussed with Rapid Response RN and ICU bedside RN.   Per RN lung secretions are terribly thick.  Patient required lavage and much suctioning this morning.  Pulse rate dropped into the 30s, oxygen sats dropped into the 60s.  Now requiring higher FiO2.  Patient examined with PA Student, Wayne Young. Patient is non-verbal but will open eyes briefly when spoken to or examined.  Lungs sound very coarse. Heart RRR.  Rash of almost confluent bright red papules on right forearm.   Swelling noted bilaterally in forearms.  Abdomen somewhat distended (fluid filled?)  PEG in place.  Subjective: Spoke with wife Wayne Young on the phone.  Explained the events of this morning.  Wayne Young stated that she is trying to make decisions about taking him off the vent.  Tells me that her daughter is against it (she lost her son 1 year ago and is still suffering with grief).  Wayne Young states her son is accepting.  The patients siblings want to come from out of town to visit today to help Wayne Young make this decision after seeing him.   Wayne Young seems to understand that unfortunately her husband is most likely not to improve and is probably nearing EOL.   She relayed the story of his comments while eating Cheetos recently "Let me have them all, I don't have  much time left".     Assessment: 65 yo gentleman.  On vent requiring high FiO2, very lethargic only briefly flutters his eyes despite being on no sedating medications.  Rapid response this am with thick secretions, hypoxic and bradycardic.   Showing symptoms of EOL in ALS patient.   Patient Profile/HPI:  65 y.o. male  with past medical history of ALS, wheelchair and vent dependent, HTN, DM2, RA, glaucoma, blind in R eye admitted on 2021/04/22 with pneumonia- likely aspiration due to worsening  dysphagia from ALS. Palliative consulted to assist with discussion re: PEG tube vs hospice.     Length of Stay: 7   Vital Signs: BP (!) 147/78   Pulse (!) 112   Temp 99.5 F (37.5 C) (Oral)   Resp 16   Ht 5\' 9"  (1.753 m)   Wt 79.7 kg   SpO2 93%   BMI 25.95 kg/m  SpO2: SpO2: 93 % O2 Device: O2 Device: Ventilator O2 Flow Rate: O2 Flow Rate (L/min): 0 L/min       Palliative Assessment/Data: 10%     Palliative Care Plan    Recommendations/Plan: Recommendation is for comfort at this time.  Wife and family are visiting over the weekend to feel comfortable with that decision. Would continue current care for now. Have requested permission for multiple family members to visit over the weekend to see patient and support Wayne Young.  Code Status:  DNR  Prognosis:  < 2 weeks  likely minutes to hours if he is taken off the vent.  Unfortunately his prognosis is still less than two weeks even with continued vent support.  Discharge Planning: To Be Determined.   Hospital death would not be unexpected.  Care plan was discussed with Dr. and bedside RN.  Thank you for allowing the Palliative Medicine Team to assist in the care of this patient.  Total time spent:  45 min.     Greater than 50%  of this time was spent counseling and coordinating care related to the above assessment and plan.  Wayne Lank, PA-C Palliative Medicine  Please contact Palliative MedicineTeam phone at  [phone] for questions and concerns between 7 am - 7 pm.   Please see AMION for individual provider pager numbers.

## 2021-04-05 NOTE — Progress Notes (Addendum)
PROGRESS NOTE    Wayne Young   UVO:[id number]  DOB: 12-08-1955  PCP: Cipriano Mile, NP    DOA: 04/17/2021 LOS: 7   Assessment & Plan   Principal Problem:   Acute on chronic respiratory failure with hypoxia (HCC) Active Problems:   Rheumatoid arthritis with positive rheumatoid factor (HCC)   Protein-calorie malnutrition, severe   Pressure injury of skin   Acute on chronic respiratory failure with hypoxemia (HCC)   ALS (amyotrophic lateral sclerosis) (HCC)   Hyperlipidemia   Hypertension   Type 2 diabetes mellitus, with long-term current use of insulin (HCC)   CAP (community acquired pneumonia)   Hypokalemia   Tracheostomy dependent (HCC)   Hypoalbuminemia   History of TB (tuberculosis)   Cerebral thrombosis with cerebral infarction    Acute on chronic respiratory failure with hypoxia due to Aspiration pneumonia in chronic vent patient with  Severe sepsis present on admission  (As evidenced by tachycardia, leukocytosis, PNA on imaging and worsened hypoxia consistent with organ dysfunction) Immune compromised state 6/18 -- increases O2 requirement 40>>60% FiO2, desat episodes and brady down due to mucus plugging - PCCM following, appreciated.  See their recs. 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  Wife reports he sometimes has fast HR.  Suspect infection causing. --change Coreg to Lopressor for less BP lowering effect (b/c severe hypotension after Coreg multiple times) --monitor closely - get EKG if appearing A-fib on monitor --PRN IV Lopressor ordered  Right upper extremity erythema and ?rash - suspect drug rash ?allergy --Abx changed to [place] as above RUE venous duplex U/S negative for DVT.   --Topical hydrocortisone.   --Monitor closely  Electrolyte Derangements - pharmacy consulted Hypomagnesemia  Hypophosphatemia Hypokalemia - likely due to Lasix; K was replaced. Prescribed 20 mEq K twice daily at home. --Monitor and replace PRN (can do PO per tube)  Hypernatremia - increasing free water flushes accordingly. Monitor BMP.  Anion gap metabolic acidosis -Resolved. Unclear cause.  Normal lactate.  Not uremic.  Was not on diuretics at the time.  No ketonuria on UA.  Monitor.  Hold Jardiance. --On sodium bicarb per tube.  Monitor BMP.   Blood culture +Staphylococcus species Abnormal UA - ?UTI - Urine culture multiple species.  Single set of blood culture grew staph capitis and is likely a contaminant.  Monitor clinically.   Acute CVA - punctate ischemic in Left ACA territory on MRI. Neurology consulted and feels this is incidental finding.  Suspect due  to small vessel disease.   --Started on ASA - continue at d/c --Follow up echo --LDL 45, A1c 6.7% - both at goal  Right Eye RAPD - chronic, due to known blindness -  Diabetic  retinopathy --See neurology note for details, confirmed by wife this is a known issue. --No further work up indicated   Anasarca - Takes 20 mg of Lasix daily; reports he drinks a lot of fluid at home and his wife agrees --Echo in Dec 2021 with EF 60 to 65% with mild LVH - Albumin level 3.1, Prealbumin is 10 --Resumed home Lasix  --Monitor volume status   Rheumatoid arthritis with positive rheumatoid factor -  On methotrexate weekly on Thursday -Hold in setting of pneumonia   Hyperlipidemia - hold Lipitor while NPO   Hypertension - Home regimen: Coreg, losartan, Lasix --cont home Lasix --Coreg changed to Lopressor as BP dropped significantly on Coreg dose sufficient enough to treat tachycardia --hold losartan, resume as BP will tolerate --IV Lopressor 5 mg PRN SBP>160 or HR>110   Type 2 diabetes mellitus, with long-term current use of insulin -  --Holding empagliflozin, glipizide  --Recent poor OP intake.  -prior attending discontinued NovoLog SSI to prevent hypoglycemia --follow CBG's, place back on SSI if above inpatient goal 140-180   Depression - continue home Paxil   Insomnia -  - cont Ativan 0.5 QHS- wife states often times he needs a second dose as 1 dose nothing for him- follow- increase as needed   History of Tuberculosis - no acute issue  Severe protein calorie malnutrition - Due to chronic illnesses above.  Albumin 2.5.  On tube feeds as above.  Dietician following, see their recs.    Pressure Injury of Skin - Present on Admission WOC consulted - see wound care recs. Frequent repositioning at least every 2 hrs.  Pressure Injury 08/03/18 Stage II -  Partial thickness loss of dermis presenting as a shallow open ulcer with a red, pink wound bed without slough. This is patchy areas of full thickness skin loss related to moisture associated skin damage, NOT a pressur (Active)  08/03/18 0800  Location: Sacrum  Location Orientation: Medial  Staging: Stage II -   Partial thickness loss of dermis presenting as a shallow open ulcer with a red, pink wound bed without slough.  Wound Description (Comments): This is patchy areas of full thickness skin loss related to moisture associated skin damage, NOT a pressure injury  Present on Admission: No     Pressure Injury 03/22/2021 Sacrum Medial Stage 1 -  Intact skin with non-blanchable redness of a localized area usually over a bony prominence. non-blanchable (Active)  03/28/2021 1600  Location: Sacrum  Location Orientation: Medial  Staging: Stage 1 -  Intact skin with non-blanchable redness of a localized area usually over a bony prominence.  Wound Description (Comments): non-blanchable  Present on Admission: Yes         Patient BMI: Body mass index is 25.95 kg/m.   DVT prophylaxis: enoxaparin (LOVENOX) injection 40 mg Start: 04/03/21 1000   Diet:  Diet Orders (From admission, onward)     Start     Ordered   03/31/21 1044  Diet NPO time specified Except for: Sips with Meds  Diet effective now       Comments: Medication in puree  Question:  Except for  Answer:  Ferrel Logan with Meds   03/31/21 1044              Code Status: DNR  Brief Narrative / Hospital Course to Date:   Camille Thau is a 65 year old male with rheumatoid arthritis, ALS with a trach on home vent, diabetes mellitus, hypertension. Presented due to oxygen desaturations into the 80s after nebulizer treatments.  In the ED, patient was febrile with temp 101 F, tachypneic and mildly tachycardic, with leukocytosis on CBC and  hypokalemia.  Anasarca noted on exam.  CT scan of the chest showed extensive left lung opacities and debris in the mainstem bronchus.    Started on vancomycin and cefepime for sepsis secondary to pneumonia.  Admitted to [facility] service.    Pulmonology consulted and following.      Subjective 04/05/21    Patient this AM had acute drop in O2 sats and bradycardic, with copious thick secretions today.  This  improved after deep suctioning and high volume of secretions removed.  He had multiple of these episodes this AM, better this afternoon.   Disposition Plan & Communication   Status is: Inpatient  Remains inpatient appropriate because:Ongoing active pain requiring inpatient pain management and IV treatments appropriate due to intensity of illness or inability to take PO  Dispo: The patient is from: Home              Anticipated d/c is to: Home              Patient currently is not medically stable to d/c.   Difficult to place patient No  Family Communication: none at bedside on AM rounds. Returned to bedside this afternoon to speak with wife, two other family members.  We discussed events of this AM and patient's tenuous clinical status with increased need for respiratory support, clinical worsening and persistent somnolence.  Discussed change in antibiotics this AM based on new culture data.     Consults, Procedures, Significant Events   Consultants:  PCCM Interventional radiology Neurology Palliative care  Procedures:  G-tube placement 6/15 Bronchoscopy  Antimicrobials:  Anti-infectives (From admission, onward)    Start     Dose/Rate Route Frequency Ordered Stop   04/05/21 0930  meropenem (MERREM) 1 g in sodium chloride 0.9 % 100 mL IVPB        1 g 200 mL/hr over 30 Minutes Intravenous Every 8 hours 04/05/21 0843     04/02/21 1540  ceFAZolin (ANCEF) IVPB 2g/100 mL premix        over 30 Minutes Intravenous Continuous PRN 04/02/21 1548 04/02/21 1540   04/01/21 1230  Ampicillin-Sulbactam (UNASYN) 3 g in sodium chloride 0.9 % 100 mL IVPB  Status:  Discontinued        3 g 200 mL/hr over 30 Minutes Intravenous Every 8 hours 04/01/21 1140 04/05/21 0824   03/31/21 2300  ceFEPIme (MAXIPIME) 2 g in sodium chloride 0.9 % 100 mL IVPB  Status:  Discontinued        2 g 200 mL/hr over 30 Minutes Intravenous Every 8 hours 03/31/21 1542 04/01/21 1044   03/31/21 2200  vancomycin  (VANCOREADY) IVPB 750 mg/150 mL  Status:  Discontinued        750 mg 150 mL/hr over 60 Minutes Intravenous Every 12 hours 03/31/21 1143 04/01/21 1044   03/31/21 1045  vancomycin (VANCOREADY) IVPB 1250 mg/250 mL        1,250 mg 166.7 mL/hr over 90 Minutes Intravenous  Once 03/31/21 0957 03/31/21 1302   03/31/2021 1700  vancomycin (VANCOREADY) IVPB 1000 mg/200 mL  Status:  Discontinued        1,000 mg 200 mL/hr over  60 Minutes Intravenous Every 12 hours 04/16/2021 0943 04/04/2021 1115   04/06/2021 1300  ceFEPIme (MAXIPIME) 2 g in sodium chloride 0.9 % 100 mL IVPB  Status:  Discontinued        2 g 200 mL/hr over 30 Minutes Intravenous Every 8 hours 03/20/2021 0833 03/31/21 1542   03/21/2021 1300  vancomycin (VANCOREADY) IVPB 1500 mg/300 mL  Status:  Discontinued        1,500 mg 150 mL/hr over 120 Minutes Intravenous Every 8 hours 03/24/2021 0941 03/26/2021 0943   04/15/2021 0530  vancomycin (VANCOREADY) IVPB 1000 mg/200 mL  Status:  Discontinued        1,000 mg 200 mL/hr over 60 Minutes Intravenous  Once 03/31/2021 0517 04/06/2021 0518   04/07/2021 0530  ceFEPIme (MAXIPIME) 2 g in sodium chloride 0.9 % 100 mL IVPB        2 g 200 mL/hr over 30 Minutes Intravenous  Once 03/23/2021 0517 03/20/2021 0549   03/25/2021 0530  vancomycin (VANCOREADY) IVPB 1250 mg/250 mL        1,250 mg 166.7 mL/hr over 90 Minutes Intravenous  Once 04/17/2021 0518 04/04/2021 0728         Micro    Objective   Vitals:   04/05/21 1417 04/05/21 1500 04/05/21 1512 04/05/21 1524  BP: 1_0   Pulse: (!) 104 (!) 103 (!) 101   Resp: _1 Temp:   98.6 F (37 C)   TempSrc:      SpO2: 95% 94% 95% 97%  Weight:      Height:        Intake/Output Summary (Last 24 hours) at 04/05/2021 1605 Last data filed at 04/05/2021 1500 Gross per 24 hour  Intake 3093.29 ml  Output 2650 ml  Net 443.29 ml   Filed Weights   04/09/2021 0323 03/30/21 0600 04/04/21 0500  Weight: 68 kg 79.7 kg 79.7 kg    Physical Exam:  General exam:  somnolent, opened eyes twice but not to command, no acute distress HEENT: trach in place on vent, copious thick secretions in tubing and canister Respiratory system: b/l rhonchi, no wheezes, vent on 60% FiO2 Cardiovascular system: tachycardic, regular rhythm, 2+ DP pulses b/l Gastrointestinal system: distended, non-tender, G tube in place. Extremities: improving pedal edema improving with skin wrinkling noted, offloading boots on b/l feet, b/l upper extremities are edematous and with erythematous rash previously R>L with progression of  rash on left noted today    Labs   Data Reviewed: I have personally reviewed following labs and imaging studies  CBC: Recent Labs  Lab 04/01/21 1831 04/02/21 0153 04/03/21 0037 04/04/21 0603 04/04/21 1114 04/05/21 0702  WBC 12.3* 12.7* 9.9 10.2  --  10.4  HGB 13.3 12.8* 13.9 14.0 12.2* 12.8*  HCT 43.7 40.6 44.4 42.6 36.0* 38.8*  MCV 85.9 83.7 83.8 79.3*  --  78.9*  PLT 279 235 238 249  --  700   Basic Metabolic Panel: Recent Labs  Lab 04/01/21 1814 04/01/21 1816 04/01/21 1831 04/03/21 0037 04/03/21 1402 04/03/21 1622 04/04/21 0603 04/04/21 1114 04/04/21 1635 04/05/21 0702  NA 145   < > 142 147*  --   --  147* 152*  --  148*  K 4.1   < > 4.0 3.3*  --   --  2.9* 2.8*  --  3.5  CL 112*  --  112* 118*  --   --  116*  --   --  109  CO2 16*  --  14* 16*  --   --  24  --   --  32  GLUCOSE 127*  --  134* 164*  --   --  323*  --   --  330*  BUN 9  --  10 5*  --   --  6*  --   --  6*  CREATININE 0.57*  --  0.58* 0.61  --   --  0.31*  --   --  <0.30*  CALCIUM 10.0  --  9.6 9.7  --   --  9.4  --   --  9.0  MG  --   --   --  2.0 1.9 2.0 2.2  --  2.0  --   PHOS  --   --   --   --  <1.0* <1.0* 1.1*  --  1.9*  --    < > = values in this interval not displayed.   GFR: CrCl cannot be calculated (This lab value cannot be used to calculate CrCl because it is not a number: <0.30). Liver Function Tests: Recent Labs  Lab 04/01/21 1831  AST 9*  ALT  11  ALKPHOS 79  BILITOT 2.2*  PROT 7.6  ALBUMIN 2.5*   No results for input(s): LIPASE, AMYLASE in the last 168 hours. No results for input(s): AMMONIA in the last 168 hours.  Coagulation Profile: No results for input(s): INR, PROTIME in the last 168 hours.  Cardiac Enzymes: No results for input(s): CKTOTAL, CKMB, CKMBINDEX, TROPONINI in the last 168 hours. BNP (last 3 results) No results for input(s): PROBNP in the last 8760 hours. HbA1C: No results for input(s): HGBA1C in the last 72 hours. CBG: Recent Labs  Lab 04/04/21 2352 04/05/21 0349 04/05/21 0734 04/05/21 1132 04/05/21 1519  GLUCAP 317* 326* 295* 310* 303*   Lipid Profile: No results for input(s): CHOL, HDL, LDLCALC, TRIG, CHOLHDL, LDLDIRECT in the last 72 hours.  Thyroid Function Tests: No results for input(s): TSH, T4TOTAL, FREET4, T3FREE, THYROIDAB in the last 72 hours. Anemia Panel: No results for input(s): VITAMINB12, FOLATE, FERRITIN, TIBC, IRON, RETICCTPCT in the last 72 hours. Sepsis Labs: Recent Labs  Lab 04/01/21 1831  LATICACIDVEN 0.9    Recent Results (from the past 240 hour(s))  Urine culture     Status: Abnormal   Collection Time: 03/27/2021  3:35 AM   Specimen: In/Out Cath Urine  Result Value Ref Range Status   Specimen Description IN/OUT CATH URINE  Final   Special Requests   Final    NONE Performed at [facility], [address] [place], [place] [address]    Culture MULTIPLE SPECIES PRESENT, SUGGEST RECOLLECTION (A)  Final   Report Status 03/30/2021 FINAL  Final  Resp Panel by RT-PCR (Flu A&B, Covid) Nasopharyngeal Swab     Status: None   Collection Time: 03/30/2021  3:52 AM   Specimen: Nasopharyngeal Swab; Nasopharyngeal(NP) swabs in vial transport medium  Result Value Ref Range Status   SARS Coronavirus 2 by RT PCR NEGATIVE NEGATIVE Final    Comment: (NOTE) SARS-CoV-2 target nucleic acids are NOT DETECTED.  The SARS-CoV-2 RNA is generally detectable in upper  respiratory specimens during the acute phase of infection. The lowest concentration of SARS-CoV-2 viral copies this assay can detect is 138 copies/mL. A negative result does not preclude SARS-Cov-2 infection and should not be used as the sole basis for treatment or other patient management decisions. A negative result may occur with  improper specimen collection/handling, submission  of specimen other than nasopharyngeal swab, presence of viral mutation(s) within the areas targeted by this assay, and inadequate number of viral copies(<138 copies/mL). A negative result must be combined with clinical observations, patient history, and epidemiological information. The expected result is Negative.  Fact Sheet for Patients:  EntrepreneurPulse.com.au  Fact Sheet for Healthcare Providers:  IncredibleEmployment.be  This test is no t yet approved or cleared by the [place] FDA and  has been authorized for detection and/or diagnosis of SARS-CoV-2 by FDA under an Emergency Use Authorization (EUA). This EUA will remain  in effect (meaning this test can be used) for the duration of the COVID-19 declaration under Section 564(b)(1) of the Act, 21 U.S.C.section 360bbb-3(b)(1), unless the authorization is terminated  or revoked sooner.       Influenza A by PCR NEGATIVE NEGATIVE Final   Influenza B by PCR NEGATIVE NEGATIVE Final    Comment: (NOTE) The Xpert Xpress SARS-CoV-2/FLU/RSV plus assay is intended as an aid in the diagnosis of influenza from Nasopharyngeal swab specimens and should not be used as a sole basis for treatment. Nasal washings and aspirates are unacceptable for Xpert Xpress SARS-CoV-2/FLU/RSV testing.  Fact Sheet for Patients: EntrepreneurPulse.com.au  Fact Sheet for Healthcare Providers: IncredibleEmployment.be  This test is not yet approved or cleared by the [place] FDA and has been  authorized for detection and/or diagnosis of SARS-CoV-2 by FDA under an Emergency Use Authorization (EUA). This EUA will remain in effect (meaning this test can be used) for the duration of the COVID-19 declaration under Section 564(b)(1) of the Act, 21 U.S.C. section 360bbb-3(b)(1), unless the authorization is terminated or revoked.  Performed at [facility], [place] [address], [place], [place] [address]   Blood Culture (routine x 2)     Status: Abnormal   Collection Time: 04/09/2021  5:08 AM   Specimen: BLOOD  Result Value Ref Range Status   Specimen Description   Final    BLOOD LEFT ANTECUBITAL Performed at [facility], [address] [place], [place] [address]    Special Requests   Final    BOTTLES DRAWN AEROBIC AND ANAEROBIC Blood Culture adequate volume Performed at [facility], [address] [place], [place] [address]    Culture  Setup Time   Final    GRAM POSITIVE COCCI IN CLUSTERS IN BOTH AEROBIC AND ANAEROBIC BOTTLES CRITICAL RESULT CALLED TO, READ BACK BY AND VERIFIED WITH: V. BRYK,PHARMD [id number] 03/30/2021 T. TYSOR    Culture (A)  Final    STAPHYLOCOCCUS CAPITIS THE SIGNIFICANCE OF ISOLATING THIS ORGANISM FROM A SINGLE SET OF BLOOD CULTURES WHEN MULTIPLE SETS ARE DRAWN IS UNCERTAIN. PLEASE NOTIFY THE MICROBIOLOGY DEPARTMENT WITHIN ONE WEEK IF SPECIATION AND SENSITIVITIES ARE REQUIRED. Performed at [facility], [place] [address] [place], [place] [address]    Report Status 04/01/2021 FINAL  Final  Blood Culture ID Panel (Reflexed)     Status: Abnormal   Collection Time: 03/20/2021  5:08 AM  Result Value Ref Range Status   Enterococcus faecalis NOT DETECTED NOT DETECTED Final   Enterococcus Faecium NOT DETECTED NOT DETECTED Final   Listeria monocytogenes NOT DETECTED NOT DETECTED Final   Staphylococcus species DETECTED (A) NOT DETECTED Final    Comment: CRITICAL RESULT CALLED TO, READ BACK BY AND VERIFIED WITH: V. Beecher Mcardle [id number]  03/30/2021 T. TYSOR    Staphylococcus aureus (BCID) NOT DETECTED NOT DETECTED Final   Staphylococcus epidermidis NOT DETECTED NOT DETECTED Final  Staphylococcus lugdunensis NOT DETECTED NOT DETECTED Final   Streptococcus species NOT DETECTED NOT DETECTED Final   Streptococcus agalactiae NOT DETECTED NOT DETECTED Final   Streptococcus pneumoniae NOT DETECTED NOT DETECTED Final   Streptococcus pyogenes NOT DETECTED NOT DETECTED Final   A.calcoaceticus-baumannii NOT DETECTED NOT DETECTED Final   Bacteroides fragilis NOT DETECTED NOT DETECTED Final   Enterobacterales NOT DETECTED NOT DETECTED Final   Enterobacter cloacae complex NOT DETECTED NOT DETECTED Final   Escherichia coli NOT DETECTED NOT DETECTED Final   Klebsiella aerogenes NOT DETECTED NOT DETECTED Final   Klebsiella oxytoca NOT DETECTED NOT DETECTED Final   Klebsiella pneumoniae NOT DETECTED NOT DETECTED Final   Proteus species NOT DETECTED NOT DETECTED Final   Salmonella species NOT DETECTED NOT DETECTED Final   Serratia marcescens NOT DETECTED NOT DETECTED Final   Haemophilus influenzae NOT DETECTED NOT DETECTED Final   Neisseria meningitidis NOT DETECTED NOT DETECTED Final   Pseudomonas aeruginosa NOT DETECTED NOT DETECTED Final   Stenotrophomonas maltophilia NOT DETECTED NOT DETECTED Final   Candida albicans NOT DETECTED NOT DETECTED Final   Candida auris NOT DETECTED NOT DETECTED Final   Candida glabrata NOT DETECTED NOT DETECTED Final   Candida krusei NOT DETECTED NOT DETECTED Final   Candida parapsilosis NOT DETECTED NOT DETECTED Final   Candida tropicalis NOT DETECTED NOT DETECTED Final   Cryptococcus neoformans/gattii NOT DETECTED NOT DETECTED Final    Comment: Performed at [facility], [address], [place], [place] [address]  Expectorated Sputum Assessment w Gram Stain, Rflx to Resp Cult     Status: None   Collection Time: 04/08/2021  8:31 AM   Specimen: Expectorated Sputum  Result Value Ref Range Status    Specimen Description EXPECTORATED SPUTUM  Final   Special Requests NONE  Final   Sputum evaluation   Final    THIS SPECIMEN IS ACCEPTABLE FOR SPUTUM CULTURE Performed at [facility], [place] [address], [place], [place] [address]    Report Status 04/04/2021 FINAL  Final  MRSA Next Gen by PCR, Nasal     Status: None   Collection Time: 03/28/2021  8:31 AM  Result Value Ref Range Status   MRSA by PCR Next Gen NOT DETECTED NOT DETECTED Final    Comment: (NOTE) The GeneXpert MRSA Assay (FDA approved for NASAL specimens only), is one component of a comprehensive MRSA colonization surveillance program. It is not intended to diagnose MRSA infection nor to guide or monitor treatment for MRSA infections. Test performance is not FDA approved in patients less than 24 years old. Performed at [facility], [place] [address], [place], [place] [address]   Culture, Respiratory w Gram Stain     Status: None   Collection Time: 04/04/2021  8:31 AM  Result Value Ref Range Status   Specimen Description EXPECTORATED SPUTUM  Final   Special Requests NONE Reflexed from [id number]  Final   Gram Stain   Final    ABUNDANT WBC PRESENT, PREDOMINANTLY PMN RARE GRAM POSITIVE COCCI Performed at [facility], [place] [address] [place], [place] [address]    Culture   Final    RARE STAPHYLOCOCCUS AUREUS RARE PROTEUS MIRABILIS    Report Status 04/01/2021 FINAL  Final   Organism ID, Bacteria STAPHYLOCOCCUS AUREUS  Final   Organism ID, Bacteria PROTEUS MIRABILIS  Final      Susceptibility   Proteus mirabilis - MIC*    AMPICILLIN <=2 SENSITIVE Sensitive     CEFAZOLIN <=4  SENSITIVE Sensitive     CEFEPIME <=0.12 SENSITIVE Sensitive     CEFTAZIDIME <=1 SENSITIVE Sensitive     CEFTRIAXONE <=0.25 SENSITIVE Sensitive     CIPROFLOXACIN <=0.25 SENSITIVE Sensitive     GENTAMICIN <=1 SENSITIVE Sensitive     IMIPENEM 8 INTERMEDIATE Intermediate     TRIMETH/SULFA <=20 SENSITIVE Sensitive     AMPICILLIN/SULBACTAM  <=2 SENSITIVE Sensitive     PIP/TAZO <=4 SENSITIVE Sensitive     * RARE PROTEUS MIRABILIS   Staphylococcus aureus - MIC*    CIPROFLOXACIN <=0.5 SENSITIVE Sensitive     ERYTHROMYCIN <=0.25 SENSITIVE Sensitive     GENTAMICIN <=0.5 SENSITIVE Sensitive     OXACILLIN 0.5 SENSITIVE Sensitive     TETRACYCLINE >=16 RESISTANT Resistant     VANCOMYCIN 1 SENSITIVE Sensitive     TRIMETH/SULFA <=10 SENSITIVE Sensitive     CLINDAMYCIN <=0.25 SENSITIVE Sensitive     RIFAMPIN <=0.5 SENSITIVE Sensitive     Inducible Clindamycin NEGATIVE Sensitive     * RARE STAPHYLOCOCCUS AUREUS  Blood Culture (routine x 2)     Status: None   Collection Time: 03/22/2021  9:38 AM   Specimen: BLOOD RIGHT HAND  Result Value Ref Range Status   Specimen Description BLOOD RIGHT HAND  Final   Special Requests   Final    BOTTLES DRAWN AEROBIC AND ANAEROBIC Blood Culture adequate volume   Culture   Final    NO GROWTH 5 DAYS Performed at [facility], [address] [place], [place] [address]    Report Status 04/03/2021 FINAL  Final  Culture, Respiratory w Gram Stain     Status: None (Preliminary result)   Collection Time: 04/02/21  2:05 PM   Specimen: Tracheal Aspirate; Respiratory  Result Value Ref Range Status   Specimen Description TRACHEAL ASPIRATE  Final   Special Requests NONE  Final   Gram Stain   Final    ABUNDANT WBC PRESENT,BOTH PMN AND MONONUCLEAR FEW GRAM NEGATIVE RODS FEW GRAM VARIABLE ROD RARE YEAST    Culture   Final    MODERATE PSEUDOMONAS AERUGINOSA SUSCEPTIBILITIES TO FOLLOW Performed at [facility], [place] [address], [place], [place] [address]    Report Status PENDING  Incomplete      Imaging Studies   DG CHEST PORT 1 VIEW  Result Date: 04/05/2021 CLINICAL DATA:  Acute on chronic respiratory failure with hypoxia. EXAM: PORTABLE CHEST 1 VIEW COMPARISON:  04/03/2021 FINDINGS: Tracheostomy tube appears to be stable in position. Again noted are airspace densities in the mid and  lower left lung and airspace densities in the right lower lung. Stable consolidation in the retrocardiac space and left lung base. Negative for a pneumothorax. IMPRESSION: Stable chest radiograph findings with bilateral airspace densities, left side greater than right. Persistent consolidation at the left lung base with left pleural fluid. Electronically Signed   By: Markus Daft M.D.   On: 04/05/2021 09:08   VAS [place] UPPER EXTREMITY VENOUS DUPLEX  Result Date: 04/04/2021 UPPER VENOUS STUDY  Patient Name:  SARGON SCOUTEN  Date of Exam:   04/04/2021 Medical Rec #: [id number]        Accession #:    [id number] Date of Birth: [birth year]        Patient Gender: M Patient Age:   65Y Exam Location:  [facility] Procedure:      VAS [place] UPPER EXTREMITY VENOUS DUPLEX Referring Phys: [id number] Narmeen Kerper A Christifer Chapdelaine --------------------------------------------------------------------------------  Indications: Edema, and Erythema Limitations: Line and bandages.  Comparison Study: no prior Performing Technologist: Archie Patten RVS  Examination Guidelines: A complete evaluation includes B-mode imaging, spectral Doppler, color Doppler, and power Doppler as needed of all accessible portions of each vessel. Bilateral testing is considered an integral part of a complete examination. Limited examinations for reoccurring indications may be performed as noted.  Right Findings: +----------+------------+---------+-----------+----------+--------------+ RIGHT     CompressiblePhasicitySpontaneousProperties   Summary     +----------+------------+---------+-----------+----------+--------------+ IJV                                                 Not visualized +----------+------------+---------+-----------+----------+--------------+ Subclavian    Full       Yes       Yes                             +----------+------------+---------+-----------+----------+--------------+ Axillary      Full       Yes       Yes                              +----------+------------+---------+-----------+----------+--------------+ Brachial      Full       Yes       Yes                             +----------+------------+---------+-----------+----------+--------------+ Radial        Full                                                 +----------+------------+---------+-----------+----------+--------------+ Ulnar         Full                                                 +----------+------------+---------+-----------+----------+--------------+ Cephalic      Full                                                 +----------+------------+---------+-----------+----------+--------------+ Basilic       Full                                                 +----------+------------+---------+-----------+----------+--------------+  Left Findings: +----------+------------+---------+-----------+----------+-------+ LEFT      CompressiblePhasicitySpontaneousPropertiesSummary +----------+------------+---------+-----------+----------+-------+ Subclavian    Full       Yes       Yes                      +----------+------------+---------+-----------+----------+-------+  Summary:  Right: No evidence of deep vein thrombosis in the upper extremity. No evidence of superficial vein thrombosis in the upper extremity. No evidence of thrombosis in the subclavian.  Left: No evidence of thrombosis in the subclavian.  *See table(s)  above for measurements and observations.  Diagnosing physician: Harold Barban MD Electronically signed by Harold Barban MD on 04/04/2021 at 10:23:50 PM.    Final      Medications   Scheduled Meds:  aspirin  81 mg Per Tube Daily   chlorhexidine gluconate (MEDLINE KIT)  15 mL Mouth Rinse BID   Chlorhexidine Gluconate Cloth  6 each Topical Daily   enoxaparin (LOVENOX) injection  40 mg Subcutaneous Q24H   feeding supplement (PROSource TF)  45 mL Per Tube BID   folic acid  1 mg Intravenous Daily    free water  200 mL Per Tube Q4H   furosemide  20 mg Per Tube Daily   hydrocortisone cream   Topical TID   insulin aspart  0-9 Units Subcutaneous Q4H   [START ON 04/06/2021] insulin glargine  20 Units Subcutaneous Daily   ipratropium-albuterol  3 mL Nebulization TID   latanoprost  1 drop Both Eyes QHS   LORazepam  0.5 mg Per Tube QHS   mouth rinse  15 mL Mouth Rinse 10 times per day   metoCLOPramide (REGLAN) injection  10 mg Intravenous Q8H   metoprolol tartrate  25 mg Per Tube BID   PARoxetine  20 mg Per Tube q morning   polyvinyl alcohol  1 drop Both Eyes TID   senna-docusate  1 tablet Per Tube QHS   sodium bicarbonate  1,300 mg Per Tube TID   Continuous Infusions:  famotidine (PEPCID) IV Stopped (04/05/21 1005)   feeding supplement (OSMOLITE 1.5 CAL) 1,000 mL (04/05/21 0852)   meropenem (MERREM) IV Stopped (04/05/21 1428)       LOS: 7 days    Time spent: 45 minutes with greater than 50% spent at bedside and in coordination of care.    Ezekiel Slocumb, DO [facility]  04/05/2021, 4:05 PM      If 7PM-7AM, please contact night-coverage. How to contact the [facility] Attending or Consulting provider [place] or covering provider during after hours [place], for this patient?    Check the care team in [facility] and look for a) attending/consulting [facility] provider listed and b) the [facility] team listed Log into [email or web address] and use [place]'s universal password to access. If you do not have the password, please contact the hospital operator. Locate the [facility] provider you are looking for under [facility] and page to a number that you can be directly reached. If you still have difficulty reaching the provider, please page the [facility] (Director on Call) for the Hospitalists listed on amion for assistance.

## 2021-04-05 NOTE — Progress Notes (Signed)
Pharmacy Antibiotic Note  Wayne Young is a 65 y.o. male w/ ALS and home trach admitted on April 16, 2021 with increased secretions. Pt was on Unasyn for MSSA and proteus in respiratory cultures, now with Pseudomonas growing. Pharmacy has been consulted for meropenem dosing.  Plan: Stop Unasyn Meropenem 1g IV q8h F/U sensitivities and ability to narrow   Height: 5\' 9"  (175.3 cm) Weight: 79.7 kg (175 lb 11.3 oz) IBW/kg (Calculated) : 70.7  Temp (24hrs), Avg:98.4 F (36.9 C), Min:96.4 F (35.8 C), Max:99.5 F (37.5 C)  Recent Labs  Lab 04/01/21 1814 04/01/21 1831 04/02/21 0153 04/03/21 0037 04/04/21 0603 04/05/21 0702  WBC  --  12.3* 12.7* 9.9 10.2 10.4  CREATININE 0.57* 0.58*  --  0.61 0.31* <0.30*  LATICACIDVEN  --  0.9  --   --   --   --     CrCl cannot be calculated (This lab value cannot be used to calculate CrCl because it is not a number: <0.30).    No Known Allergies  Antimicrobials this admission: Cefepime 6/11 > 6/14 Vanc 6/11 > 6/11 resume 6/13 > 6/14 Unasyn 6/14 > 6/18 Meropenem 6/18 >>  Microbiology results: 6/11 UCx > multiple species 6/11 BCx x 2 > staph captvi 6/11 Resp Cx>  proteus S all except tetrac and staph aureus S all except imipen  6/11 MRSA PCR > neg 6/15 TA > rare Pseudomonas  Thank you for allowing pharmacy to be a part of this patient's care.   7/15, PharmD, BCPS, [facility] Clinical Pharmacist [phone] Please check AMION for all [facility] numbers 04/05/2021

## 2021-04-06 DIAGNOSIS — I6330 Cerebral infarction due to thrombosis of unspecified cerebral artery: Secondary | ICD-10-CM | POA: Diagnosis not present

## 2021-04-06 DIAGNOSIS — E43 Unspecified severe protein-calorie malnutrition: Secondary | ICD-10-CM | POA: Diagnosis not present

## 2021-04-06 DIAGNOSIS — G1221 Amyotrophic lateral sclerosis: Secondary | ICD-10-CM | POA: Diagnosis not present

## 2021-04-06 DIAGNOSIS — J9621 Acute and chronic respiratory failure with hypoxia: Secondary | ICD-10-CM | POA: Diagnosis not present

## 2021-04-06 LAB — GLUCOSE, CAPILLARY
Glucose-Capillary: 301 mg/dL — ABNORMAL HIGH (ref 70–99)
Glucose-Capillary: 310 mg/dL — ABNORMAL HIGH (ref 70–99)
Glucose-Capillary: 313 mg/dL — ABNORMAL HIGH (ref 70–99)
Glucose-Capillary: 315 mg/dL — ABNORMAL HIGH (ref 70–99)
Glucose-Capillary: 316 mg/dL — ABNORMAL HIGH (ref 70–99)
Glucose-Capillary: 335 mg/dL — ABNORMAL HIGH (ref 70–99)
Glucose-Capillary: 379 mg/dL — ABNORMAL HIGH (ref 70–99)

## 2021-04-06 LAB — CULTURE, RESPIRATORY W GRAM STAIN

## 2021-04-06 LAB — MAGNESIUM: Magnesium: 2.1 mg/dL (ref 1.7–2.4)

## 2021-04-06 LAB — CBC WITH DIFFERENTIAL/PLATELET
Abs Immature Granulocytes: 0.1 10*3/uL — ABNORMAL HIGH (ref 0.00–0.07)
Basophils Absolute: 0 10*3/uL (ref 0.0–0.1)
Basophils Relative: 0 %
Eosinophils Absolute: 0.4 10*3/uL (ref 0.0–0.5)
Eosinophils Relative: 4 %
HCT: 40.9 % (ref 39.0–52.0)
Hemoglobin: 13.5 g/dL (ref 13.0–17.0)
Immature Granulocytes: 1 %
Lymphocytes Relative: 13 %
Lymphs Abs: 1.6 10*3/uL (ref 0.7–4.0)
MCH: 26.6 pg (ref 26.0–34.0)
MCHC: 33 g/dL (ref 30.0–36.0)
MCV: 80.5 fL (ref 80.0–100.0)
Monocytes Absolute: 0.8 10*3/uL (ref 0.1–1.0)
Monocytes Relative: 6 %
Neutro Abs: 9.6 10*3/uL — ABNORMAL HIGH (ref 1.7–7.7)
Neutrophils Relative %: 76 %
Platelets: 233 10*3/uL (ref 150–400)
RBC: 5.08 MIL/uL (ref 4.22–5.81)
RDW: 18.5 % — ABNORMAL HIGH (ref 11.5–15.5)
WBC: 12.6 10*3/uL — ABNORMAL HIGH (ref 4.0–10.5)
nRBC: 0 % (ref 0.0–0.2)

## 2021-04-06 LAB — POTASSIUM: Potassium: 3.6 mmol/L (ref 3.5–5.1)

## 2021-04-06 LAB — SODIUM: Sodium: 141 mmol/L (ref 135–145)

## 2021-04-06 LAB — BASIC METABOLIC PANEL
Anion gap: 4 — ABNORMAL LOW (ref 5–15)
BUN: 10 mg/dL (ref 8–23)
CO2: 37 mmol/L — ABNORMAL HIGH (ref 22–32)
Calcium: 8.8 mg/dL — ABNORMAL LOW (ref 8.9–10.3)
Chloride: 107 mmol/L (ref 98–111)
Creatinine, Ser: <0.3 mg/dL — ABNORMAL LOW (ref 0.61–1.24)
Glucose, Bld: 343 mg/dL — ABNORMAL HIGH (ref 70–99)
Potassium: 3.5 mmol/L (ref 3.5–5.1)
Sodium: 148 mmol/L — ABNORMAL HIGH (ref 135–145)

## 2021-04-06 LAB — PHOSPHORUS
Phosphorus: <1 mg/dL — CL (ref 2.5–4.6)
Phosphorus: 1.9 mg/dL — ABNORMAL LOW (ref 2.5–4.6)

## 2021-04-06 LAB — PROCALCITONIN: Procalcitonin: 0.73 ng/mL

## 2021-04-06 LAB — BRAIN NATRIURETIC PEPTIDE: B Natriuretic Peptide: 57.4 pg/mL (ref 0.0–100.0)

## 2021-04-06 MED ORDER — POTASSIUM CHLORIDE 20 MEQ PO PACK
40.0000 meq | PACK | Freq: Once | ORAL | Status: AC
  Administered 2021-04-06: 40 meq
  Filled 2021-04-06: qty 2

## 2021-04-06 MED ORDER — K PHOS MONO-SOD PHOS DI & MONO 155-852-130 MG PO TABS
250.0000 mg | ORAL_TABLET | Freq: Two times a day (BID) | ORAL | Status: DC
  Administered 2021-04-06 (×2): 250 mg
  Filled 2021-04-06 (×3): qty 1

## 2021-04-06 MED ORDER — CHLORHEXIDINE GLUCONATE CLOTH 2 % EX PADS
6.0000 | MEDICATED_PAD | Freq: Every day | CUTANEOUS | Status: DC
  Administered 2021-04-06 – 2021-04-10 (×5): 6 via TOPICAL

## 2021-04-06 MED ORDER — POTASSIUM PHOSPHATES 15 MMOLE/5ML IV SOLN
20.0000 mmol | Freq: Once | INTRAVENOUS | Status: AC
  Administered 2021-04-06: 20 mmol via INTRAVENOUS
  Filled 2021-04-06: qty 6.67

## 2021-04-06 MED ORDER — INSULIN ASPART 100 UNIT/ML IJ SOLN
0.0000 [IU] | INTRAMUSCULAR | Status: DC
  Administered 2021-04-06: 8 [IU] via SUBCUTANEOUS
  Administered 2021-04-06: 15 [IU] via SUBCUTANEOUS
  Administered 2021-04-06 (×2): 11 [IU] via SUBCUTANEOUS
  Administered 2021-04-07 (×4): 8 [IU] via SUBCUTANEOUS
  Administered 2021-04-07: 5 [IU] via SUBCUTANEOUS
  Administered 2021-04-07: 11 [IU] via SUBCUTANEOUS
  Administered 2021-04-08: 3 [IU] via SUBCUTANEOUS
  Administered 2021-04-08: 5 [IU] via SUBCUTANEOUS
  Administered 2021-04-08 (×2): 3 [IU] via SUBCUTANEOUS
  Administered 2021-04-08: 2 [IU] via SUBCUTANEOUS
  Administered 2021-04-09: 3 [IU] via SUBCUTANEOUS
  Administered 2021-04-09: 5 [IU] via SUBCUTANEOUS
  Administered 2021-04-09: 2 [IU] via SUBCUTANEOUS
  Administered 2021-04-09: 5 [IU] via SUBCUTANEOUS
  Administered 2021-04-09: 3 [IU] via SUBCUTANEOUS
  Administered 2021-04-09 (×2): 5 [IU] via SUBCUTANEOUS
  Administered 2021-04-10: 2 [IU] via SUBCUTANEOUS

## 2021-04-06 MED ORDER — FUROSEMIDE 10 MG/ML IJ SOLN
40.0000 mg | Freq: Two times a day (BID) | INTRAMUSCULAR | Status: DC
  Administered 2021-04-06 – 2021-04-09 (×8): 40 mg via INTRAVENOUS
  Filled 2021-04-06 (×9): qty 4

## 2021-04-06 MED ORDER — INSULIN ASPART 100 UNIT/ML IJ SOLN
10.0000 [IU] | Freq: Three times a day (TID) | INTRAMUSCULAR | Status: DC
  Administered 2021-04-06: 10 [IU] via SUBCUTANEOUS

## 2021-04-06 MED ORDER — INSULIN ASPART 100 UNIT/ML IJ SOLN
10.0000 [IU] | INTRAMUSCULAR | Status: DC
  Administered 2021-04-06 – 2021-04-07 (×6): 10 [IU] via SUBCUTANEOUS

## 2021-04-06 NOTE — Progress Notes (Signed)
Daily Progress Note   Patient Name: Wayne Young       Date: 04/06/2021 DOB: 1956-06-16  Age: 65 y.o. MRN#: [id number] Attending Physician: Ezekiel Slocumb, DO Primary Care Physician: Cipriano Mile, NP Admit Date: 04/14/2021  Reason for Consultation/Follow-up:  To discuss complex medical decision making related to patient's goals of care  Patient appears more alert today.  Will open eyes and keep them open.  Still requiring 50% FIo2.  Secretions are still thick.  Lung sounds are very coarse.   Subjective: Wayne Isaacs, PA-S2 and I met family at bedside.   Wayne Young (short for Pork Wayne Young) sat with [place] and talked about caring for "Wayne Young".   They have done an amazing job of caring for him in the home.  Wayne Young and her children are a team the manage him 24x7.  There was much conversation about his care at home.  In the process Wayne Young discusses the loss of her daughter's son (he was shot in the eye).  We talked about Wayne Young and Wayne Young's daughter who is having great difficult accepting the eventuality of her father's death.  I asked how we can support her - Wayne Young states there's nothing we can do.  Its just going to happen. "She's fir'in".  We focused on Wayne Young.  I asked Wayne Young "when the time comes where do you want him to be?"   She responded that she wants him here in the hospital where the RNs will provide excellent 24 hour care.  We talked about a Hospice facility.  Wayne Young explains that she an Wayne Young had another daughter who died of cancer at the [facility].  The memories are too strong and she doesn't want Wayne Young to be there.   She wants him to die in the hospital.  Wayne Young talked about being asked to make a decision - "I cant make a decision about when to disconnect the breathing machine - I can not".     I asked her why - she explained that she an Wayne Young had talked about what to do if his heart stopped, but they never discussed anything about taking off the breathing machine.  I asked her if she feared her family?  She acknowledged that her daughter would come apart and she didn't want to be the cause of that.  Finally  Wayne Young said again "I can't make the decision".   I said, "You need the doctors to make the decision for you."  She agreed.  She mentioned that she and Wayne Young had agreed to give [facility] over the weekend until Monday.   If he is better then we will work towards getting him home.   If he is worse we will shift to comfort and work towards taking him off the machine.  Wayne Young nod.  That is the agreement.   Wayne Young states that she knows Wayne Young is not going to get better.  She knows ALS is progressive.   Wayne Young comments that he (Wayne Young) would not want to be that way.  He understands that his father's time is near.  Wayne Young then states that Wayne Young is at peace and he will be happy to see his brothers and family in [place].   Assessment: 65 yo with advanced ALS with PNA and UTI.  Not significantly improving.   Patient Profile/HPI:  65 yo gentleman.  On vent requiring high FiO2, very lethargic only briefly flutters his eyes despite being on no sedating medications.  Rapid response this am with thick secretions, hypoxic and bradycardic.   Showing symptoms of EOL in ALS patient.     Length of Stay: 8   Vital Signs: BP (!) 149/76   Pulse (!) 110   Temp 99 F (37.2 C)   Resp 16   Ht 5' 9"  (1.753 m)   Wt 79.7 kg   SpO2 92%   BMI 25.95 kg/m  SpO2: SpO2: 92 % O2 Device: O2 Device: Ventilator O2 Flow Rate: O2 Flow Rate (L/min): 0 L/min       Palliative Assessment/Data: 10%     Palliative Care Plan    Recommendations/Plan: Continue current care. Wayne Kaufman, NP with PMT will follow up tomorrow If Wayne Young is worse or not improved the doctors will determine it is time to shift to comfort  measures. Hopefully Wayne Young will be on a solid trend toward improving and going home with Wayne Young. When Wayne Young's time comes Wayne Young wants him to die in the hospital.   She also made it clear that SHE can not make the decision to remove the vent.   Code Status:  DNR  Prognosis:  Days to weeks   Discharge Planning: Home with family vs Hospital Death.   Have not yet talked with Wayne Young about Hospice support in the home.  Care plan was discussed with family and RN.  Thank you for allowing the Palliative Medicine Team to assist in the care of this patient.  Total time spent:  75 min. Time in 3:00 pm Time out 4:15 pm     Greater than 50%  of this time was spent counseling and coordinating care related to the above assessment and plan.  Wayne Jenny, PA-C Palliative Medicine  Please contact Palliative MedicineTeam phone at [phone] for questions and concerns between 7 am - 7 pm.   Please see AMION for individual provider pager numbers.

## 2021-04-06 NOTE — Progress Notes (Signed)
RT NOTE: hold SBT on patient this AM due to FIO2 requirements of 60% upon arrival to patient room.  Patient also chronic vent/trach.  Tolerating current ventilator settings well at this time.  Will continue to monitor.

## 2021-04-06 NOTE — Progress Notes (Signed)
eLink Physician-Brief Progress Note Patient Name: Abdur Hoglund DOB: [birth year] MRN: [id number]   Date of Service  04/06/2021  HPI/Events of Note  Notified of phos <1, K 3.5 Creatinine < 0.3  eICU Interventions  Kphos 20 mmol ordered     Intervention Category Major Interventions: Electrolyte abnormality - evaluation and management  Darl Pikes 04/06/2021, 2:52 AM

## 2021-04-06 NOTE — Progress Notes (Signed)
PROGRESS NOTE    Wayne Young   WJX:[id number]  DOB: 10-28-1955  PCP: Cipriano Mile, NP    DOA: 03/25/2021 LOS: 8   Assessment & Plan   Principal Problem:   Acute on chronic respiratory failure with hypoxia (HCC) Active Problems:   Rheumatoid arthritis with positive rheumatoid factor (HCC)   Protein-calorie malnutrition, severe   Pressure injury of skin   Acute on chronic respiratory failure with hypoxemia (HCC)   ALS (amyotrophic lateral sclerosis) (HCC)   Hyperlipidemia   Hypertension   Type 2 diabetes mellitus, with long-term current use of insulin (HCC)   CAP (community acquired pneumonia)   Hypokalemia   Tracheostomy dependent ([place])   Hypoalbuminemia   History of TB (tuberculosis)   Cerebral thrombosis with cerebral infarction    Acute on chronic respiratory failure with hypoxia due to Aspiration pneumonia in chronic vent patient with  Severe sepsis present on admission  (As evidenced by tachycardia, leukocytosis, PNA on imaging and worsened hypoxia consistent with organ dysfunction) Immune compromised state 6/18 -- increases O2 requirement 40>>60% FiO2, desat  episodes and brady down due to mucus plugging 6/19 -- improved with abx change yesterday - PCCM following, appreciated.  See their recs. 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  Wife reports he sometimes has fast HR.  Suspect infection causing. --change Coreg to Lopressor for less BP lowering effect (b/c severe hypotension after Coreg multiple times) --monitor closely - get EKG if appearing A-fib on monitor --PRN IV Lopressor ordered  Right upper extremity erythema and ?rash - suspect drug rash ?allergy - improving --Abx changed to Merrem as above RUE venous duplex U/S negative for DVT.   --Topical hydrocortisone.   --Monitor closely  Electrolyte Derangements - pharmacy consulted Please replace per tube / avoid extra volume with any IV replacement when possible.  Hypomagnesemia  Hypophosphatemia - critical <1 today despite prior replacement Hypokalemia - likely due to Lasix; K replaced. Prescribed 20 mEq K twice daily at home. --Monitor and replace PRN (can do PO per tube) --K Phos BID per tube x 2 days and reassess  Hypernatremia - adjust free water flushes accordingly. Monitor BMP.  Has been stable upper 140's.  Anion gap metabolic acidosis -Resolved. Unclear cause.  Normal lactate.  Not uremic.  Was not on diuretics at the time.  No ketonuria on UA.  Monitor.  Hold Jardiance. --d/c sodium bicarb  --Monitor BMP   Blood culture +Staphylococcus species Abnormal UA - ?UTI - Urine culture multiple species.  Single set of blood culture grew staph capitis and is  likely a contaminant.  Monitor clinically.   Acute CVA - punctate ischemic in Left ACA territory on MRI. Neurology consulted and feels this is incidental finding.  Suspect due to small vessel disease.    --Started on ASA - continue at d/c --LDL 45, A1c 6.7% - both at goal  Right Eye RAPD - chronic, due to known blindness -  Diabetic retinopathy --See neurology note for details, confirmed by wife this is a known issue. --No further work up indicated   Anasarca - Takes 20 mg of Lasix daily; reports he drinks a lot of fluid at home and his wife agrees, hypoalbuminemia with third-spacing of fluid --Echo in Dec 2021 with EF 60 to 65% with mild LVH --Hold home Lasix  --Monitor volume status 6/18-19: increased abdominal distention and edema of extremities.  Good response to trial 20 mg IV Lasix 6/18 --Increase diuresis: Lasix 40 mg IV BID --Strict I/O's   Rheumatoid arthritis with positive rheumatoid factor -  On methotrexate weekly on Thursday -Hold in setting of pneumonia   Hyperlipidemia - hold Lipitor while NPO   Hypertension - Home regimen: Coreg, losartan, Lasix --cont home Lasix --Coreg changed to Lopressor as BP dropped significantly on Coreg dose sufficient enough to treat tachycardia --hold losartan, resume as BP will tolerate --IV Lopressor 5 mg PRN SBP>160 or HR>110   Type 2 diabetes mellitus, with long-term current use of insulin -  Hyperglycemia - CBG's in 300's, adjusting insulin --Holding empagliflozin, glipizide  --Increase Lantus to 20 units daily --Schedule Novolog 10 units q4h + sliding scale  --follow CBG's, place back on SSI if above inpatient goal 140-180   Depression - continue home Paxil   Insomnia -  - cont Ativan 0.5 QHS- wife states often times he needs a second dose as 1 dose nothing for him- follow- increase as needed   History of Tuberculosis - no acute issue  Severe protein calorie malnutrition - Due to chronic illnesses above.  Albumin 2.5.  On tube feeds as above.  Dietician following, see their recs.    Pressure Injury of Skin - Present on Admission WOC consulted - see wound care recs. Frequent repositioning at least every 2  hrs.  Pressure Injury 08/03/18 Stage II -  Partial thickness loss of dermis presenting as a shallow open ulcer with a red, pink wound bed without slough. This is patchy areas of full thickness skin loss related to moisture associated skin damage, NOT a pressur (Active)  08/03/18 0800  Location: Sacrum  Location Orientation: Medial  Staging: Stage II -  Partial thickness loss of dermis presenting as a shallow open ulcer with a red, pink wound bed without slough.  Wound Description (Comments): This is patchy areas of full thickness skin loss related to moisture associated skin damage, NOT a pressure injury  Present on Admission: No     Pressure Injury 03/28/2021 Sacrum Medial Stage 1 -  Intact skin with non-blanchable redness of a localized area usually over a bony prominence. non-blanchable (Active)  04/13/2021 1600  Location: Sacrum  Location Orientation: Medial  Staging: Stage 1 -  Intact skin with non-blanchable redness of a localized area usually over a bony prominence.  Wound Description (Comments): non-blanchable  Present on Admission: Yes         Patient BMI: Body mass index is 25.95 kg/m.   DVT prophylaxis: enoxaparin (LOVENOX) injection 40 mg Start: 04/03/21 1000   Diet:  Diet Orders (From admission, onward)     Start  Ordered   03/31/21 1044  Diet NPO time specified Except for: Sips with Meds  Diet effective now       Comments: Medication in puree  Question:  Except for  Answer:  Ferrel Logan with Meds   03/31/21 1044              Code Status: DNR   Brief Narrative / Hospital Course to Date:   Khian Remo is a 65 year old male with rheumatoid arthritis, ALS with a trach on home vent, diabetes mellitus, hypertension. Presented due to oxygen desaturations into the 80s after nebulizer treatments.  In the ED, patient was febrile with temp 101 F, tachypneic and mildly tachycardic, with leukocytosis on CBC and  hypokalemia.  Anasarca noted on exam.  CT scan of the  chest showed extensive left lung opacities and debris in the mainstem bronchus.    Started on vancomycin and cefepime for sepsis secondary to pneumonia.  Admitted to [facility] service.    Pulmonology consulted and following.      Subjective 04/06/21    Patient reportedly did not have desaturation episodes overnight.  This AM his eyes were open when I entered the room and he follows command to blink once if understanding.     Disposition Plan & Communication   Status is: Inpatient  Remains inpatient appropriate because:Ongoing active pain requiring inpatient pain management and IV treatments appropriate due to intensity of illness or inability to take PO  Dispo: The patient is from: Home              Anticipated d/c is to: Home              Patient currently is not medically stable to d/c.   Difficult to place patient No  Family Communication: none at bedside on AM rounds.  6/17 PM: Returned to bedside this afternoon to speak with wife, two other family members.  We discussed events of this AM and patient's tenuous clinical status with increased need for respiratory support, clinical worsening and persistent somnolence.  Discussed change in antibiotics this AM based on new culture data.    Palliative meeting with family at 3:00 today  Consults, Procedures, Significant Events   Consultants:  PCCM Interventional radiology Neurology Palliative care  Procedures:  G-tube placement 6/15 Bronchoscopy  Antimicrobials:  Anti-infectives (From admission, onward)    Start     Dose/Rate Route Frequency Ordered Stop   04/05/21 0930  meropenem (MERREM) 1 g in sodium chloride 0.9 % 100 mL IVPB        1 g 200 mL/hr over 30 Minutes Intravenous Every 8 hours 04/05/21 0843     04/02/21 1540  ceFAZolin (ANCEF) IVPB 2g/100 mL premix        over 30 Minutes Intravenous Continuous PRN 04/02/21 1548 04/02/21 1540   04/01/21 1230  Ampicillin-Sulbactam (UNASYN) 3 g in sodium chloride 0.9 % 100 mL IVPB   Status:  Discontinued        3 g 200 mL/hr over 30 Minutes Intravenous Every 8 hours 04/01/21 1140 04/05/21 0824   03/31/21 2300  ceFEPIme (MAXIPIME) 2 g in sodium chloride 0.9 % 100 mL IVPB  Status:  Discontinued        2 g 200 mL/hr over 30 Minutes Intravenous Every 8 hours 03/31/21 1542 04/01/21 1044   03/31/21 2200  vancomycin (VANCOREADY) IVPB 750 mg/150 mL  Status:  Discontinued        750 mg 150 mL/hr over 60 Minutes Intravenous Every 12 hours  03/31/21 1143 04/01/21 1044   03/31/21 1045  vancomycin (VANCOREADY) IVPB 1250 mg/250 mL        1,250 mg 166.7 mL/hr over 90 Minutes Intravenous  Once 03/31/21 0957 03/31/21 1302   03/23/2021 1700  vancomycin (VANCOREADY) IVPB 1000 mg/200 mL  Status:  Discontinued        1,000 mg 200 mL/hr over 60 Minutes Intravenous Every 12 hours 04/12/2021 0943 04/10/2021 1115   04/06/2021 1300  ceFEPIme (MAXIPIME) 2 g in sodium chloride 0.9 % 100 mL IVPB  Status:  Discontinued        2 g 200 mL/hr over 30 Minutes Intravenous Every 8 hours 04/10/2021 0833 03/31/21 1542   03/19/2021 1300  vancomycin (VANCOREADY) IVPB 1500 mg/300 mL  Status:  Discontinued        1,500 mg 150 mL/hr over 120 Minutes Intravenous Every 8 hours 03/27/2021 0941 03/23/2021 0943   03/27/2021 0530  vancomycin (VANCOREADY) IVPB 1000 mg/200 mL  Status:  Discontinued        1,000 mg 200 mL/hr over 60 Minutes Intravenous  Once 04/13/2021 0517 03/31/2021 0518   04/06/2021 0530  ceFEPIme (MAXIPIME) 2 g in sodium chloride 0.9 % 100 mL IVPB        2 g 200 mL/hr over 30 Minutes Intravenous  Once 04/08/2021 0517 03/24/2021 0549   03/28/2021 0530  vancomycin (VANCOREADY) IVPB 1250 mg/250 mL        1,250 mg 166.7 mL/hr over 90 Minutes Intravenous  Once 03/20/2021 0518 03/21/2021 0728         Micro    Objective   Vitals:   04/06/21 1000 04/06/21 1100 04/06/21 1200 04/06/21 1214  BP: 101/62 127/74 (!) 149/76   Pulse: (!) 118 (!) 116 (!) 110   Resp: _0 Temp:  99.8 F (37.7 C)    TempSrc:      SpO2: 97%  94% 96% 95%  Weight:      Height:        Intake/Output Summary (Last 24 hours) at 04/06/2021 1253 Last data filed at 04/06/2021 1205 Gross per 24 hour  Intake 3212.37 ml  Output 5250 ml  Net -2037.63 ml   Filed Weights   03/28/2021 0323 03/30/21 0600 04/04/21 0500  Weight: 68 kg 79.7 kg 79.7 kg    Physical Exam:  General exam: eyes open during most of encounter, blinks on command when asked to do so if understanding, no acute distress, first he's been responsive for me in a few days HEENT: trach in place on vent, less secretions Respiratory system: b/l rhonchi, no wheezes, vent on 60% FiO2 Cardiovascular system: tachycardic, regular rhythm, 2+ DP pulses b/l Gastrointestinal system: slightly less distended, non-tender, G tube in place. Extremities: pedal edema continues to improve, offloading boots on b/l feet, b/l upper extremities are edematous, b/l UE rash appears stable to improving    Labs   Data Reviewed: I have personally reviewed following labs and imaging studies  CBC: Recent Labs  Lab 04/02/21 0153 04/03/21 0037 04/04/21 0603 04/04/21 1114 04/05/21 0702 04/06/21 0101  WBC 12.7* 9.9 10.2  --  10.4 12.6*  NEUTROABS  --   --   --   --   --  9.6*  HGB 12.8* 13.9 14.0 12.2* 12.8* 13.5  HCT 40.6 44.4 42.6 36.0* 38.8* 40.9  MCV 83.7 83.8 79.3*  --  78.9* 80.5  PLT 235 238 249  --  238 191   Basic Metabolic Panel: Recent Labs  Lab 04/01/21  1831 04/03/21 0037 04/03/21 1402 04/03/21 1622 04/04/21 0603 04/04/21 1114 04/04/21 1635 04/05/21 0702 04/06/21 0101  NA 142 147*  --   --  147* 152*  --  148* 148*  K 4.0 3.3*  --   --  2.9* 2.8*  --  3.5 3.5  CL 112* 118*  --   --  116*  --   --  109 107  CO2 14* 16*  --   --  24  --   --  32 37*  GLUCOSE 134* 164*  --   --  323*  --   --  330* 343*  BUN 10 5*  --   --  6*  --   --  6* 10  CREATININE 0.58* 0.61  --   --  0.31*  --   --  <0.30* <0.30*  CALCIUM 9.6 9.7  --   --  9.4  --   --  9.0 8.8*  MG  --  2.0 1.9  2.0 2.2  --  2.0  --  2.1  PHOS  --   --  <1.0* <1.0* 1.1*  --  1.9*  --  <1.0*   GFR: CrCl cannot be calculated (This lab value cannot be used to calculate CrCl because it is not a number: <0.30). Liver Function Tests: Recent Labs  Lab 04/01/21 1831  AST 9*  ALT 11  ALKPHOS 79  BILITOT 2.2*  PROT 7.6  ALBUMIN 2.5*   No results for input(s): LIPASE, AMYLASE in the last 168 hours. No results for input(s): AMMONIA in the last 168 hours.  Coagulation Profile: No results for input(s): INR, PROTIME in the last 168 hours.  Cardiac Enzymes: No results for input(s): CKTOTAL, CKMB, CKMBINDEX, TROPONINI in the last 168 hours. BNP (last 3 results) No results for input(s): PROBNP in the last 8760 hours. HbA1C: No results for input(s): HGBA1C in the last 72 hours. CBG: Recent Labs  Lab 04/05/21 1956 04/05/21 2359 04/06/21 0408 04/06/21 0722 04/06/21 1134  GLUCAP 354* 310* 335* 313* 379*   Lipid Profile: No results for input(s): CHOL, HDL, LDLCALC, TRIG, CHOLHDL, LDLDIRECT in the last 72 hours.  Thyroid Function Tests: No results for input(s): TSH, T4TOTAL, FREET4, T3FREE, THYROIDAB in the last 72 hours. Anemia Panel: No results for input(s): VITAMINB12, FOLATE, FERRITIN, TIBC, IRON, RETICCTPCT in the last 72 hours. Sepsis Labs: Recent Labs  Lab 04/01/21 1831 04/06/21 0808  PROCALCITON  --  0.73  LATICACIDVEN 0.9  --     Recent Results (from the past 240 hour(s))  Urine culture     Status: Abnormal   Collection Time: 03/27/2021  3:35 AM   Specimen: In/Out Cath Urine  Result Value Ref Range Status   Specimen Description IN/OUT CATH URINE  Final   Special Requests   Final    NONE Performed at [facility], [address], [place], [place] [address]    Culture MULTIPLE SPECIES PRESENT, SUGGEST RECOLLECTION (A)  Final   Report Status 03/30/2021 FINAL  Final  Resp Panel by RT-PCR (Flu A&B, Covid) Nasopharyngeal Swab     Status: None   Collection Time: 03/20/2021   3:52 AM   Specimen: Nasopharyngeal Swab; Nasopharyngeal(NP) swabs in vial transport medium  Result Value Ref Range Status   SARS Coronavirus 2 by RT PCR NEGATIVE NEGATIVE Final    Comment: (NOTE) SARS-CoV-2 target nucleic acids are NOT DETECTED.  The SARS-CoV-2 RNA is generally detectable in upper respiratory specimens during the acute phase  of infection. The lowest concentration of SARS-CoV-2 viral copies this assay can detect is 138 copies/mL. A negative result does not preclude SARS-Cov-2 infection and should not be used as the sole basis for treatment or other patient management decisions. A negative result may occur with  improper specimen collection/handling, submission of specimen other than nasopharyngeal swab, presence of viral mutation(s) within the areas targeted by this assay, and inadequate number of viral copies(<138 copies/mL). A negative result must be combined with clinical observations, patient history, and epidemiological information. The expected result is Negative.  Fact Sheet for Patients:  EntrepreneurPulse.com.au  Fact Sheet for Healthcare Providers:  IncredibleEmployment.be  This test is no t yet approved or cleared by the [place] FDA and  has been authorized for detection and/or diagnosis of SARS-CoV-2 by FDA under an Emergency Use Authorization (EUA). This EUA will remain  in effect (meaning this test can be used) for the duration of the COVID-19 declaration under Section 564(b)(1) of the Act, 21 U.S.C.section 360bbb-3(b)(1), unless the authorization is terminated  or revoked sooner.       Influenza A by PCR NEGATIVE NEGATIVE Final   Influenza B by PCR NEGATIVE NEGATIVE Final    Comment: (NOTE) The Xpert Xpress SARS-CoV-2/FLU/RSV plus assay is intended as an aid in the diagnosis of influenza from Nasopharyngeal swab specimens and should not be used as a sole basis for treatment. Nasal washings and aspirates  are unacceptable for Xpert Xpress SARS-CoV-2/FLU/RSV testing.  Fact Sheet for Patients: EntrepreneurPulse.com.au  Fact Sheet for Healthcare Providers: IncredibleEmployment.be  This test is not yet approved or cleared by the [place] FDA and has been authorized for detection and/or diagnosis of SARS-CoV-2 by FDA under an Emergency Use Authorization (EUA). This EUA will remain in effect (meaning this test can be used) for the duration of the COVID-19 declaration under Section 564(b)(1) of the Act, 21 U.S.C. section 360bbb-3(b)(1), unless the authorization is terminated or revoked.  Performed at [facility], [place] [address] [place], [place] [address]   Blood Culture (routine x 2)     Status: Abnormal   Collection Time: 03/26/2021  5:08 AM   Specimen: BLOOD  Result Value Ref Range Status   Specimen Description   Final    BLOOD LEFT ANTECUBITAL Performed at [facility], [address] [place], [place] [address]    Special Requests   Final    BOTTLES DRAWN AEROBIC AND ANAEROBIC Blood Culture adequate volume Performed at [facility], [address] [place], [place] [address]    Culture  Setup Time   Final    GRAM POSITIVE COCCI IN CLUSTERS IN BOTH AEROBIC AND ANAEROBIC BOTTLES CRITICAL RESULT CALLED TO, READ BACK BY AND VERIFIED WITH: V. BRYK,PHARMD [id number] 03/30/2021 T. TYSOR    Culture (A)  Final    STAPHYLOCOCCUS CAPITIS THE SIGNIFICANCE OF ISOLATING THIS ORGANISM FROM A SINGLE SET OF BLOOD CULTURES WHEN MULTIPLE SETS ARE DRAWN IS UNCERTAIN. PLEASE NOTIFY THE MICROBIOLOGY DEPARTMENT WITHIN ONE WEEK IF SPECIATION AND SENSITIVITIES ARE REQUIRED. Performed at [facility], [place] [address] [place], [place] [address]    Report Status 04/01/2021 FINAL  Final  Blood Culture ID Panel (Reflexed)     Status: Abnormal   Collection Time: 04/17/2021  5:08 AM  Result Value Ref Range Status   Enterococcus faecalis NOT  DETECTED NOT DETECTED Final   Enterococcus Faecium NOT DETECTED NOT DETECTED Final   Listeria monocytogenes NOT DETECTED NOT DETECTED Final  Staphylococcus species DETECTED (A) NOT DETECTED Final    Comment: CRITICAL RESULT CALLED TO, READ BACK BY AND VERIFIED WITH: V. Beecher Mcardle [id number] 03/30/2021 T. TYSOR    Staphylococcus aureus (BCID) NOT DETECTED NOT DETECTED Final   Staphylococcus epidermidis NOT DETECTED NOT DETECTED Final   Staphylococcus lugdunensis NOT DETECTED NOT DETECTED Final   Streptococcus species NOT DETECTED NOT DETECTED Final   Streptococcus agalactiae NOT DETECTED NOT DETECTED Final   Streptococcus pneumoniae NOT DETECTED NOT DETECTED Final   Streptococcus pyogenes NOT DETECTED NOT DETECTED Final   A.calcoaceticus-baumannii NOT DETECTED NOT DETECTED Final   Bacteroides fragilis NOT DETECTED NOT DETECTED Final   Enterobacterales NOT DETECTED NOT DETECTED Final   Enterobacter cloacae complex NOT DETECTED NOT DETECTED Final   Escherichia coli NOT DETECTED NOT DETECTED Final   Klebsiella aerogenes NOT DETECTED NOT DETECTED Final   Klebsiella oxytoca NOT DETECTED NOT DETECTED Final   Klebsiella pneumoniae NOT DETECTED NOT DETECTED Final   Proteus species NOT DETECTED NOT DETECTED Final   Salmonella species NOT DETECTED NOT DETECTED Final   Serratia marcescens NOT DETECTED NOT DETECTED Final   Haemophilus influenzae NOT DETECTED NOT DETECTED Final   Neisseria meningitidis NOT DETECTED NOT DETECTED Final   Pseudomonas aeruginosa NOT DETECTED NOT DETECTED Final   Stenotrophomonas maltophilia NOT DETECTED NOT DETECTED Final   Candida albicans NOT DETECTED NOT DETECTED Final   Candida auris NOT DETECTED NOT DETECTED Final   Candida glabrata NOT DETECTED NOT DETECTED Final   Candida krusei NOT DETECTED NOT DETECTED Final   Candida parapsilosis NOT DETECTED NOT DETECTED Final   Candida tropicalis NOT DETECTED NOT DETECTED Final   Cryptococcus neoformans/gattii NOT DETECTED  NOT DETECTED Final    Comment: Performed at [facility], [address], [place], [place] [address]  Expectorated Sputum Assessment w Gram Stain, Rflx to Resp Cult     Status: None   Collection Time: 04/16/2021  8:31 AM   Specimen: Expectorated Sputum  Result Value Ref Range Status   Specimen Description EXPECTORATED SPUTUM  Final   Special Requests NONE  Final   Sputum evaluation   Final    THIS SPECIMEN IS ACCEPTABLE FOR SPUTUM CULTURE Performed at [facility], [place] [address], [place], [place] [address]    Report Status 03/26/2021 FINAL  Final  MRSA Next Gen by PCR, Nasal     Status: None   Collection Time: 03/20/2021  8:31 AM  Result Value Ref Range Status   MRSA by PCR Next Gen NOT DETECTED NOT DETECTED Final    Comment: (NOTE) The GeneXpert MRSA Assay (FDA approved for NASAL specimens only), is one component of a comprehensive MRSA colonization surveillance program. It is not intended to diagnose MRSA infection nor to guide or monitor treatment for MRSA infections. Test performance is not FDA approved in patients less than 63 years old. Performed at [facility], [place] [address], [place], [place] [address]   Culture, Respiratory w Gram Stain     Status: None   Collection Time: 03/28/2021  8:31 AM  Result Value Ref Range Status   Specimen Description EXPECTORATED SPUTUM  Final   Special Requests NONE Reflexed from [id number]  Final   Gram Stain   Final    ABUNDANT WBC PRESENT, PREDOMINANTLY PMN RARE GRAM POSITIVE COCCI Performed at [facility], [place] [address], [place], [place] [address]    Culture   Final    RARE STAPHYLOCOCCUS AUREUS RARE PROTEUS MIRABILIS  Report Status 04/01/2021 FINAL  Final   Organism ID, Bacteria STAPHYLOCOCCUS AUREUS  Final   Organism ID, Bacteria PROTEUS MIRABILIS  Final      Susceptibility   Proteus mirabilis - MIC*    AMPICILLIN <=2 SENSITIVE Sensitive     CEFAZOLIN <=4 SENSITIVE Sensitive     CEFEPIME <=0.12 SENSITIVE  Sensitive     CEFTAZIDIME <=1 SENSITIVE Sensitive     CEFTRIAXONE <=0.25 SENSITIVE Sensitive     CIPROFLOXACIN <=0.25 SENSITIVE Sensitive     GENTAMICIN <=1 SENSITIVE Sensitive     IMIPENEM 8 INTERMEDIATE Intermediate     TRIMETH/SULFA <=20 SENSITIVE Sensitive     AMPICILLIN/SULBACTAM <=2 SENSITIVE Sensitive     PIP/TAZO <=4 SENSITIVE Sensitive     * RARE PROTEUS MIRABILIS   Staphylococcus aureus - MIC*    CIPROFLOXACIN <=0.5 SENSITIVE Sensitive     ERYTHROMYCIN <=0.25 SENSITIVE Sensitive     GENTAMICIN <=0.5 SENSITIVE Sensitive     OXACILLIN 0.5 SENSITIVE Sensitive     TETRACYCLINE >=16 RESISTANT Resistant     VANCOMYCIN 1 SENSITIVE Sensitive     TRIMETH/SULFA <=10 SENSITIVE Sensitive     CLINDAMYCIN <=0.25 SENSITIVE Sensitive     RIFAMPIN <=0.5 SENSITIVE Sensitive     Inducible Clindamycin NEGATIVE Sensitive     * RARE STAPHYLOCOCCUS AUREUS  Blood Culture (routine x 2)     Status: None   Collection Time: 04/09/2021  9:38 AM   Specimen: BLOOD RIGHT HAND  Result Value Ref Range Status   Specimen Description BLOOD RIGHT HAND  Final   Special Requests   Final    BOTTLES DRAWN AEROBIC AND ANAEROBIC Blood Culture adequate volume   Culture   Final    NO GROWTH 5 DAYS Performed at [facility], [address], [place], [place] [address]    Report Status 04/03/2021 FINAL  Final  Culture, Respiratory w Gram Stain     Status: None (Preliminary result)   Collection Time: 04/02/21  2:05 PM   Specimen: Tracheal Aspirate; Respiratory  Result Value Ref Range Status   Specimen Description TRACHEAL ASPIRATE  Final   Special Requests NONE  Final   Gram Stain   Final    ABUNDANT WBC PRESENT,BOTH PMN AND MONONUCLEAR FEW GRAM NEGATIVE RODS FEW GRAM VARIABLE ROD RARE YEAST    Culture   Final    MODERATE PSEUDOMONAS AERUGINOSA SUSCEPTIBILITIES TO FOLLOW Performed at [facility], [place] [address] [place],  [address]    Report Status PENDING  Incomplete      Imaging  Studies   DG CHEST PORT 1 VIEW  Result Date: 04/05/2021 CLINICAL DATA:  Acute on chronic respiratory failure with hypoxia. EXAM: PORTABLE CHEST 1 VIEW COMPARISON:  04/03/2021 FINDINGS: Tracheostomy tube appears to be stable in position. Again noted are airspace densities in the mid and lower left lung and airspace densities in the right lower lung. Stable consolidation in the retrocardiac space and left lung base. Negative for a pneumothorax. IMPRESSION: Stable chest radiograph findings with bilateral airspace densities, left side greater than right. Persistent consolidation at the left lung base with left pleural fluid. Electronically Signed   By: Markus Daft M.D.   On: 04/05/2021 09:08   VAS [place] UPPER EXTREMITY VENOUS DUPLEX  Result Date: 04/04/2021 UPPER VENOUS STUDY  Patient Name:  ELIGIO ANGERT  Date of Exam:   04/04/2021 Medical Rec #: [id number]        Accession #:    [id number] Date of Birth: August 22, 1956  Patient Gender: M Patient Age:   41Y Exam Location:  [facility] Procedure:      VAS [place] UPPER EXTREMITY VENOUS DUPLEX Referring Phys: [id number] Shayne Diguglielmo A Shonteria Abeln --------------------------------------------------------------------------------  Indications: Edema, and Erythema Limitations: Line and bandages. Comparison Study: no prior Performing Technologist: Archie Patten RVS  Examination Guidelines: A complete evaluation includes B-mode imaging, spectral Doppler, color Doppler, and power Doppler as needed of all accessible portions of each vessel. Bilateral testing is considered an integral part of a complete examination. Limited examinations for reoccurring indications may be performed as noted.  Right Findings: +----------+------------+---------+-----------+----------+--------------+ RIGHT     CompressiblePhasicitySpontaneousProperties   Summary     +----------+------------+---------+-----------+----------+--------------+ IJV                                                  Not visualized +----------+------------+---------+-----------+----------+--------------+ Subclavian    Full       Yes       Yes                             +----------+------------+---------+-----------+----------+--------------+ Axillary      Full       Yes       Yes                             +----------+------------+---------+-----------+----------+--------------+ Brachial      Full       Yes       Yes                             +----------+------------+---------+-----------+----------+--------------+ Radial        Full                                                 +----------+------------+---------+-----------+----------+--------------+ Ulnar         Full                                                 +----------+------------+---------+-----------+----------+--------------+ Cephalic      Full                                                 +----------+------------+---------+-----------+----------+--------------+ Basilic       Full                                                 +----------+------------+---------+-----------+----------+--------------+  Left Findings: +----------+------------+---------+-----------+----------+-------+ LEFT      CompressiblePhasicitySpontaneousPropertiesSummary +----------+------------+---------+-----------+----------+-------+ Subclavian    Full       Yes       Yes                      +----------+------------+---------+-----------+----------+-------+  Summary:  Right: No evidence of deep vein thrombosis in the upper extremity. No evidence of superficial vein thrombosis in the upper extremity. No evidence of thrombosis in the subclavian.  Left: No evidence of thrombosis in the subclavian.  *See table(s) above for measurements and observations.  Diagnosing physician: Harold Barban MD Electronically signed by Harold Barban MD on 04/04/2021 at 10:23:50 PM.    Final      Medications   Scheduled Meds:  aspirin  81  mg Per Tube Daily   chlorhexidine gluconate (MEDLINE KIT)  15 mL Mouth Rinse BID   Chlorhexidine Gluconate Cloth  6 each Topical Q2000   enoxaparin (LOVENOX) injection  40 mg Subcutaneous Q24H   feeding supplement (PROSource TF)  45 mL Per Tube BID   folic acid  1 mg Intravenous Daily   free water  200 mL Per Tube Q4H   furosemide  40 mg Intravenous BID   hydrocortisone cream   Topical TID   insulin aspart  0-15 Units Subcutaneous Q4H   insulin aspart  10 Units Subcutaneous Q4H   insulin glargine  20 Units Subcutaneous Daily   ipratropium-albuterol  3 mL Nebulization TID   latanoprost  1 drop Both Eyes QHS   LORazepam  0.5 mg Per Tube QHS   mouth rinse  15 mL Mouth Rinse 10 times per day   metoCLOPramide (REGLAN) injection  10 mg Intravenous Q8H   metoprolol tartrate  25 mg Per Tube BID   PARoxetine  20 mg Per Tube q morning   phosphorus  250 mg Per Tube BID   polyvinyl alcohol  1 drop Both Eyes TID   senna-docusate  1 tablet Per Tube QHS   Continuous Infusions:  famotidine (PEPCID) IV Stopped (04/06/21 0932)   feeding supplement (OSMOLITE 1.5 CAL) 60 mL/hr at 04/06/21 0400   meropenem (MERREM) IV Stopped (04/06/21 0533)       LOS: 8 days    Time spent: 30 minutes with greater than 50% spent at bedside and in coordination of care.    Ezekiel Slocumb, DO [facility]  04/06/2021, 12:53 PM      If 7PM-7AM, please contact night-coverage. How to contact the [facility] Attending or Consulting provider [place] or covering provider during after hours [place], for this patient?    Check the care team in [facility] and look for a) attending/consulting TR[facility] provider listed and b) the [facility] team listed Log into [email or web address] and use [place]'s universal password to access. If you do not have the password, please contact the hospital operator. Locate the [facility] provider you are looking for under [facility] and page to a number that you can be directly reached. If you still have  difficulty reaching the provider, please page the [facility] (Director on Call) for the Hospitalists listed on amion for assistance.

## 2021-04-07 DIAGNOSIS — Z930 Tracheostomy status: Secondary | ICD-10-CM

## 2021-04-07 DIAGNOSIS — E1169 Type 2 diabetes mellitus with other specified complication: Secondary | ICD-10-CM

## 2021-04-07 DIAGNOSIS — G1221 Amyotrophic lateral sclerosis: Secondary | ICD-10-CM | POA: Diagnosis not present

## 2021-04-07 DIAGNOSIS — J189 Pneumonia, unspecified organism: Secondary | ICD-10-CM | POA: Diagnosis not present

## 2021-04-07 DIAGNOSIS — R1310 Dysphagia, unspecified: Secondary | ICD-10-CM

## 2021-04-07 DIAGNOSIS — J9621 Acute and chronic respiratory failure with hypoxia: Secondary | ICD-10-CM | POA: Diagnosis not present

## 2021-04-07 DIAGNOSIS — Z794 Long term (current) use of insulin: Secondary | ICD-10-CM

## 2021-04-07 LAB — GLUCOSE, CAPILLARY
Glucose-Capillary: 255 mg/dL — ABNORMAL HIGH (ref 70–99)
Glucose-Capillary: 259 mg/dL — ABNORMAL HIGH (ref 70–99)
Glucose-Capillary: 284 mg/dL — ABNORMAL HIGH (ref 70–99)
Glucose-Capillary: 290 mg/dL — ABNORMAL HIGH (ref 70–99)
Glucose-Capillary: 309 mg/dL — ABNORMAL HIGH (ref 70–99)

## 2021-04-07 LAB — BASIC METABOLIC PANEL
Anion gap: 6 (ref 5–15)
BUN: 14 mg/dL (ref 8–23)
CO2: 35 mmol/L — ABNORMAL HIGH (ref 22–32)
Calcium: 8.5 mg/dL — ABNORMAL LOW (ref 8.9–10.3)
Chloride: 107 mmol/L (ref 98–111)
Creatinine, Ser: <0.3 mg/dL — ABNORMAL LOW (ref 0.61–1.24)
Glucose, Bld: 299 mg/dL — ABNORMAL HIGH (ref 70–99)
Potassium: 4.2 mmol/L (ref 3.5–5.1)
Sodium: 148 mmol/L — ABNORMAL HIGH (ref 135–145)

## 2021-04-07 LAB — CBC
HCT: 40.3 % (ref 39.0–52.0)
Hemoglobin: 12.5 g/dL — ABNORMAL LOW (ref 13.0–17.0)
MCH: 25.6 pg — ABNORMAL LOW (ref 26.0–34.0)
MCHC: 31 g/dL (ref 30.0–36.0)
MCV: 82.4 fL (ref 80.0–100.0)
Platelets: 230 10*3/uL (ref 150–400)
RBC: 4.89 MIL/uL (ref 4.22–5.81)
RDW: 18.6 % — ABNORMAL HIGH (ref 11.5–15.5)
WBC: 15 10*3/uL — ABNORMAL HIGH (ref 4.0–10.5)
nRBC: 0.1 % (ref 0.0–0.2)

## 2021-04-07 LAB — MAGNESIUM: Magnesium: 2.2 mg/dL (ref 1.7–2.4)

## 2021-04-07 LAB — PHOSPHORUS: Phosphorus: 1.7 mg/dL — ABNORMAL LOW (ref 2.5–4.6)

## 2021-04-07 MED ORDER — FREE WATER
250.0000 mL | Status: DC
  Administered 2021-04-07 – 2021-04-10 (×17): 250 mL

## 2021-04-07 MED ORDER — POTASSIUM PHOSPHATES 15 MMOLE/5ML IV SOLN
20.0000 mmol | Freq: Once | INTRAVENOUS | Status: AC
  Administered 2021-04-07: 20 mmol via INTRAVENOUS
  Filled 2021-04-07: qty 6.67

## 2021-04-07 MED ORDER — INSULIN GLARGINE 100 UNIT/ML ~~LOC~~ SOLN
25.0000 [IU] | Freq: Two times a day (BID) | SUBCUTANEOUS | Status: DC
  Administered 2021-04-07 – 2021-04-09 (×5): 25 [IU] via SUBCUTANEOUS
  Filled 2021-04-07 (×8): qty 0.25

## 2021-04-07 MED ORDER — INSULIN GLARGINE 100 UNIT/ML ~~LOC~~ SOLN
25.0000 [IU] | Freq: Every day | SUBCUTANEOUS | Status: DC
  Administered 2021-04-07: 25 [IU] via SUBCUTANEOUS
  Filled 2021-04-07: qty 0.25

## 2021-04-07 MED ORDER — INSULIN ASPART 100 UNIT/ML IJ SOLN
16.0000 [IU] | INTRAMUSCULAR | Status: DC
  Administered 2021-04-07 – 2021-04-10 (×17): 16 [IU] via SUBCUTANEOUS

## 2021-04-07 MED ORDER — K PHOS MONO-SOD PHOS DI & MONO 155-852-130 MG PO TABS
250.0000 mg | ORAL_TABLET | Freq: Three times a day (TID) | ORAL | Status: DC
  Administered 2021-04-07 (×3): 250 mg
  Filled 2021-04-07 (×4): qty 1

## 2021-04-07 NOTE — Progress Notes (Signed)
NAME:  Wayne Young, MRN:  [id number], DOB:  1955-12-04, LOS: 9 ADMISSION DATE:  04/05/2021, CONSULTATION DATE: 04/15/2021 REFERRING MD:  Dr. Laverta Baltimore, CHIEF COMPLAINT: Hypoxic respiratory failure  Brief Summary:   65 year old gentleman with history of ALS with tracheostomy on home ventilation, HTN, DM, arthritis, and glaucoma presenting from home with acute onset of hypoxia and increased work of breathing.  Patient has home health nurse who noted increased work of breathing and hypoxemia which began overnight.  Has been having increased secretions since Wednesday.  She had been suctioning them, was suctioned without secretions and given breathing treatment with minimal increase in O2.   In fact after the most recent suctioning home nurse noted that his oxygen saturations went down significantly and thus called EMS.  On EMS arrival, O2 sats in the 80s which increased to the 90s on 6 L from usually 5 L bled it.  Home health nurse also reported improving lower leg edema.  This is been improving over the last few months.  In ER, noted to be febrile at 101, minimally tachycardic at 106 and normotensive.  Labs significant for WBC 17, K 2.8, low BUN 7 and sCr 0.32, albumin 3.1, normal lactate, INR 1.3, SARS/ flu neg, UA cloudy with large leukocytes and pyuria.  CXR noted for confluent masslike opacities in the left mid lung, most suspicious for pneumonia, but underlying mass not excluded.  Blood and urine cultures sent and empirically started on vancomycin and cefepime.  TRH to admit, PCCM consulted for chronic vent management.    Significant Hospital Events: Including procedures, antibiotic start and stop dates in addition to other pertinent events   Admit to [facility] with sepsis, pneumonia/ UTI. Vanc and cefepime started.  6/14 Sputum growing proteus mirabilis, rare staph; abx changed to Unasyn 6/14 code stroke called and then cancelled for new pupil size discrepancy L>R and L pupil with new nonreactivity to  light, evaluated by neuro and received brain MRI which showed punctate area of ischemia in superior left parietal lobe, not candidate for tPA, pt at baseline mental status 6/14 palliative care following, code status discussions. Made DNR 6/15 PEG placed. FOB for complete opacification of left hemithorax. Sputum culture repeated.  6/17 cuff leak from vent. Checked abg and f/u cxr  6/19 Increased secretions / high peak pressures, hypoxemia + fever. MSSA+ Pseudomonas in trach culture. Rash on RUE.  Family met with palliative care > family desires in hospital death.   Interim History / Subjective:  Afebrile  RN reports no acute events with vent - 40%, PEEP 5 Glucose 259-299  I/O 4L UOP, -256m in last 24 hours  Objective   Blood pressure 137/79, pulse (!) 116, temperature 98.9 F (37.2 C), temperature source Oral, resp. rate 16, height 5' 9"  (1.753 m), weight 79.8 kg, SpO2 96 %.    Vent Mode: PRVC FiO2 (%):  [40 %-50 %] 40 % Set Rate:  [16 bmp] 16 bmp Vt Set:  [560 mL] 560 mL PEEP:  [5 cmH20] 5 cmH20 Plateau Pressure:  [25 cmH20-27 cmH20] 26 cmH20   Intake/Output Summary (Last 24 hours) at 04/07/2021 0759 Last data filed at 04/07/2021 0600 Gross per 24 hour  Intake 3707.12 ml  Output 4000 ml  Net -292.88 ml   Filed Weights   03/30/21 0600 04/04/21 0500 04/07/21 0139  Weight: 79.7 kg 79.7 kg 79.8 kg    Examination: General: chronically ill appearing adult male lying in bed on vent in NAD HEENT: MM pink/moist, trach midline, c/d/I,  anicteric  Neuro: tracks staff, anicteric, changes in extremities consistent with ALS CV: s1s2 RRR, no m/r/g PULM: non-labored on vent, lungs bilaterally coarse with rhonchi, thick pink tracheal secretio GI: soft, bsx4 active, tolerating TF Extremities: warm/dry, generalized dependent edema  Skin: no rashes or lesions   Resolved Hospital Problem list     Assessment & Plan:   Acute on chronic hypoxic respiratory failure 2/2 ALS & Left PNA -  MSSA/Proteus/Pseudomonas  Ventilator and Trach dependent (POA) Mucous plugging and atelectasis  Sepsis due to extensive pneumonia DM w/ hyperglycemia Dysphagia now s/p PEG Fluid and electrolyte imbalance: hypophosphatemia, hypernatremia, hyperchloremia, hypokalemia   Uncontrolled hypertension  -continue PRVC as rest mode ventilation  -meropenem, plan for 5 days duration (was on abx prior to mero) -continue pulmonary hygiene as able - chest percussion via bed, frequent NTS -NPO -follow intermittent CXR -avoid sedation  -Duoneb TID -appreciate Palliative Care discussion with family > per notes, wife unable to make a decision to "stop care", she would defer to MD on timing of when we should transition to more comfort focused care.  Agree that Mr. Smail is nearing end of life and would support comfort measures.   -glucose control per primary     Best Practice (right click and "Reselect all SmartList Selections" daily)   Diet/type: tubefeeds Pain/Anxiety/Delirium protocol Yes and RASS goal: 0 VAP protocol (if indicated): Yes DVT prophylaxis: LMWH GI prophylaxis: H2B Glucose control:  SSI and Basal coverage Central venous access:  N/A Arterial line:  N/A Foley:  Yes, and it is still needed Mobility:  bed rest  PT consulted: N/A Studies pending: None Culture data pending: none Last reviewed culture data:today Antibiotics:meropenem Antibiotic de-escalation: no,  continue current rx Stop date: ordered Daily labs:  per primary  Code Status:  DNR Last date of multidisciplinary goals of care discussion [per primary ] Not life-threatening Disposition: Continue current level of care    Critical Care Time: 38 minutes   Noe Gens, MSN, APRN, NP-C, AGACNP-BC [facility] 04/07/2021, 8:24 AM   Please see [email or web address] for pager details.   From 7A-7P if no response, please call [phone] After hours, please call ELink [phone]

## 2021-04-07 NOTE — Progress Notes (Signed)
Daily Progress Note   Patient Name: Wayne Young       Date: 04/07/2021 DOB: 09-22-1956  Age: 65 y.o. MRN#: [id number] Attending Physician: Ezekiel Slocumb, DO Primary Care Physician: Cipriano Mile, NP Admit Date: 03/28/2021  Reason for Consultation/Follow-up: Establishing goals of care  Subjective: [name] is a little more interactive today per report of Dr. Arbutus Ped and family. On my eval he was more awake than he was when I saw him last- however, is not back to his baseline mental status as reported by his wife.  Per family discussion with Dr. Arbutus Ped current plan is to continue meropenem until course is completed and monitor patient status. Of note- he was requiring more support via vent today and is not nearing being able to use his home equipment.  Spouse and daughter expressed difficulty in thinking about removing ventilator if patient did not improve due to patient's long term vent dependence. The vent really has become part of him.   We discussed options of de-escalating care- stopping tube feeds, IV fluids, antibiotics, labs, maintenance medications- and providing symptom management while continuing vent support and allowing for natural dying process and stopping vent either when patient was fully non-responsive and actively dying or allowing him to die on the vent. They did seem more comfortable with this option.  Encouraged spouse and daughter to keep patient's goals of care as previously expressed at the center of their thoughts when we face these difficult decisions- those being- his goals are to be able to interact meaningfully with his family- if he cannot do that then he doesn't want his life prolonged.   Review of Systems  Unable to perform ROS: Acuity of condition   Length of  Stay: 9  Current Medications: Scheduled Meds:  . aspirin  81 mg Per Tube Daily  . chlorhexidine gluconate (MEDLINE KIT)  15 mL Mouth Rinse BID  . Chlorhexidine Gluconate Cloth  6 each Topical Q2000  . enoxaparin (LOVENOX) injection  40 mg Subcutaneous Q24H  . feeding supplement (PROSource TF)  45 mL Per Tube BID  . folic acid  1 mg Intravenous Daily  . free water  250 mL Per Tube Q4H  . furosemide  40 mg Intravenous BID  . hydrocortisone cream   Topical TID  . insulin aspart  0-15 Units Subcutaneous Q4H  .  insulin aspart  16 Units Subcutaneous Q4H  . insulin glargine  25 Units Subcutaneous Daily  . ipratropium-albuterol  3 mL Nebulization TID  . latanoprost  1 drop Both Eyes QHS  . LORazepam  0.5 mg Per Tube QHS  . mouth rinse  15 mL Mouth Rinse 10 times per day  . metoCLOPramide (REGLAN) injection  10 mg Intravenous Q8H  . metoprolol tartrate  25 mg Per Tube BID  . PARoxetine  20 mg Per Tube q morning  . phosphorus  250 mg Per Tube TID  . polyvinyl alcohol  1 drop Both Eyes TID  . senna-docusate  1 tablet Per Tube QHS    Continuous Infusions: . famotidine (PEPCID) IV Stopped (04/07/21 1002)  . feeding supplement (OSMOLITE 1.5 CAL) 60 mL/hr at 04/06/21 0400  . meropenem (MERREM) IV 1 g (04/07/21 1355)  . potassium PHOSPHATE IVPB (in mmol) 85 mL/hr at 04/07/21 1300    PRN Meds: acetaminophen, labetalol, metoprolol tartrate, mineral oil-hydrophilic petrolatum, polyvinyl alcohol  Physical Exam Vitals and nursing note reviewed.            Vital Signs: BP (!) 106/59   Pulse 98   Temp 99.7 F (37.6 C) (Oral)   Resp 16   Ht 5' 9"  (1.753 m)   Wt 79.8 kg   SpO2 97%   BMI 25.98 kg/m  SpO2: SpO2: 97 % O2 Device: O2 Device: Ventilator O2 Flow Rate: O2 Flow Rate (L/min): 0 L/min  Intake/output summary:  Intake/Output Summary (Last 24 hours) at 04/07/2021 1508 Last data filed at 04/07/2021 1300 Gross per 24 hour  Intake 3372.9 ml  Output 2650 ml  Net 722.9 ml   LBM:  Last BM Date: 04/06/21 Baseline Weight: Weight: 68 kg Most recent weight: Weight: 79.8 kg       Palliative Assessment/Data:      Patient Active Problem List   Diagnosis Date Noted  . Dysphagia   . Cerebral thrombosis with cerebral infarction 04/02/2021  . CAP (community acquired pneumonia) 03/27/2021  . Hypokalemia 03/25/2021  . 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 Hypoalbuminemia 04/16/2021  . History of TB (tuberculosis) 04/05/2021  . Acute on chronic respiratory failure with hypoxia ([place]) 03/24/2021  . ALS (amyotrophic lateral sclerosis) ([place]) 02/07/2021  . Acute on chronic respiratory failure with hypoxemia ([place]) 08/09/2019  . Hyperlipidemia 08/09/2019  . Hypertension 08/09/2019  . Type 2 diabetes mellitus, with long-term current use of insulin ([place]) 08/09/2019  . Consolidation lung ([place])   . Acute respiratory distress   . Encounter for imaging study to confirm orogastric (OG) tube placement   . Self extubation   . Aspiration into airway   . Pressure injury of skin 08/04/2018  . Atelectasis of left lung   . Protein-calorie malnutrition, severe 07/28/2018  . Recent unintentional weight loss over several months   . Acute respiratory failure ([place]) 07/25/2018  . 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 Rheumatoid arthritis with positive rheumatoid factor ([place]) 07/13/2018  . Incidental pulmonary nodule, > 19m and < 837m09/25/2019  . 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 Weight loss 07/13/2018  . Chronic midline low back pain without sciatica 12/31/2017  . High risk medication use 06/16/2017  . History of rheumatoid arthritis 06/16/2017    Palliative Care Assessment & Plan   Patient Profile:  [age].o. male  with past medical history of ALS, wheelchair and vent dependent, HTN, DM2, RA, glaucoma,  blind in R eye admitted on 04/05/2021 with pneumonia- likely aspiration due to worsening  dysphagia from ALS. Palliative consulted to assist with discussion re: PEG tube vs hospice.    Assessment/Recommendations/Plan  Continue current interventions Complete course of meropenem and then re-eval GOC  Goals of Care and Additional Recommendations: Limitations on Scope of Treatment: Full Scope Treatment  Code Status: DNR  Prognosis:  Unable to determine  Discharge Planning: To Be Determined  Care plan was discussed with patient and care team  Thank you for allowing the Palliative Medicine Team to assist in the care of this patient.   Total time: 38 minutes Greater than 50%  of this time was spent counseling and coordinating care related to the above assessment and plan.  Mariana Kaufman, AGNP-C Palliative Medicine   Please contact Palliative Medicine Team phone at [phone] for questions and concerns.

## 2021-04-07 NOTE — Plan of Care (Signed)
  Problem: Respiratory: Goal: Ability to maintain a clear airway and adequate ventilation will improve Outcome: Progressing   Problem: Education: Goal: Knowledge of General Education information will improve Description: Including pain rating scale, medication(s)/side effects and non-pharmacologic comfort measures Outcome: Progressing   Problem: Clinical Measurements: Goal: Diagnostic test results will improve Outcome: Progressing Goal: Cardiovascular complication will be avoided Outcome: Progressing   Problem: Elimination: Goal: Will not experience complications related to bowel motility Outcome: Progressing Goal: Will not experience complications related to urinary retention Outcome: Progressing

## 2021-04-07 NOTE — Progress Notes (Signed)
PROGRESS NOTE    Wayne Young   ZOX:[id number]  DOB: [birth year]  PCP: Cipriano Mile, NP    DOA: 04/03/2021 LOS: 9   Assessment & Plan   Principal Problem:   Acute on chronic respiratory failure with hypoxia (HCC) Active Problems:   Rheumatoid arthritis with positive rheumatoid factor (HCC)   Protein-calorie malnutrition, severe   Pressure injury of skin   Acute on chronic respiratory failure with hypoxemia (HCC)   ALS (amyotrophic lateral sclerosis) (HCC)   Hyperlipidemia   Hypertension   Type 2 diabetes mellitus, with long-term current use of insulin (HCC)   CAP (community acquired pneumonia)   Hypokalemia   Tracheostomy dependent ([place])   Hypoalbuminemia   History of TB (tuberculosis)   Cerebral thrombosis with cerebral infarction   Dysphagia    Acute on chronic respiratory failure with hypoxia due to Aspiration pneumonia in chronic vent patient with  Severe sepsis present on admission  (As evidenced by tachycardia, leukocytosis, PNA on imaging and worsened hypoxia consistent with organ dysfunction) Immune compromised state 6/18 -- increased O2 requirement 40>>60% FiO2, desat  episodes and brady down due to mucus plugging 6/19 -- improved with abx change yesterday 6/20 -- further improvement, most interactive all week but this waxes and wanes, lethargic at times; 50% FiO2 - PCCM following, appreciated.  See their recs. -Extensive consolidation in the left lung with a small effusion-central airway debris in both mainstem bronchi S/p  Flexible Bronchoscopy 6/15 - Strep pneumo urinary antigen negative, HIV negative --Sputum culture w/ rare staph aureus and Proteus mirabilis --6/18 Tracheal aspirate cx with Pseudomonas --Initially on vancomycin and cefepime >> Unasyn --6/18 Change to Merrem for coverage and given rash ?allergy  --hypertonic saline nebs --Uses device called a "coughilator" at home wife was told she is welcome to bring it   ALS (amyotrophic lateral  sclerosis) Quadriplegic Ventilator dependent with tracheostomy Dysphagia with aspiration - NPO -He is wheelchair bound, sleeps reclined in his wheelchair and has a trach- wife cares for him - 6/16 > SLP re-evaluated and started dysphagia 1 diet w/nectar thickened liquids --Palliative care consulted, appreciate assistance with [place] talks  --G-tube placed by IR 6/15 --Tube feeds - started 6/16 --Free water 250 cc q4h & monitor sodium Na  Sinus tachycardia - clearly sinus rhythm on monitor, but watch for A-fib.  Wife reports he sometimes has fast HR.  Suspect infection causing. --change Coreg to Lopressor for less BP lowering effect (b/c severe hypotension after Coreg multiple times) --monitor closely - get EKG if appearing A-fib on monitor --PRN IV Lopressor ordered  Right upper extremity erythema and ?rash - suspect drug rash ?allergy - improving --On Merrem as above RUE venous duplex U/S negative for DVT.   --Topical hydrocortisone.   --Monitor closely  Electrolyte Derangements - pharmacy consulted Please replace per tube / avoid extra volume with any IV replacement when possible.  Hypomagnesemia  Hypophosphatemia - critical <1 today despite prior replacement Hypokalemia - likely due to Lasix; K replaced. Prescribed 20 mEq K twice daily at home. --Monitor and replace PRN (can do PO per tube) --K Phos BID per tube x 2 days and reassess  Hypernatremia - adjust free water flushes accordingly. Monitor BMP.  Has been stable upper 140's.  Anion gap metabolic acidosis -Resolved. Unclear cause.  Normal lactate.  Not uremic.  Was not on diuretics at the time.  No ketonuria on UA.  Monitor.  Hold Jardiance. --d/c sodium bicarb  --Monitor BMP   Blood culture +Staphylococcus species Abnormal  UA - ?UTI - Urine culture multiple species.  Single set of blood culture grew staph capitis and is likely a contaminant.  Monitor clinically.   Acute CVA - punctate ischemic in Left ACA territory  on MRI. Neurology consulted and feels this is incidental finding.  Suspect due to small vessel disease.   --Started on ASA - continue at d/c --LDL 45, A1c 6.7% - both at goal  Right Eye RAPD - chronic, due to known blindness -  Diabetic retinopathy --See neurology note for details, confirmed by wife this is a known issue. --No further work up indicated   Anasarca - Takes 20 mg of Lasix daily; reports he drinks a lot of fluid at home and his wife agrees, hypoalbuminemia with third-spacing of fluid --Echo in Dec 2021 with EF 60 to 65% with mild LVH --Hold home Lasix  --Monitor volume status 6/18-19: increased abdominal distention and edema of extremities.  Good response to trial 20 mg IV Lasix 6/18 6/20: diuresing well  --Continue diuresis: Lasix 40 mg IV BID --Possibly back off diuresis tomorrow --Strict I/O's   Rheumatoid arthritis with positive rheumatoid factor -  On methotrexate weekly on Thursday -Hold in setting of pneumonia   Hyperlipidemia - hold Lipitor while NPO   Hypertension - Home regimen: Coreg, losartan, Lasix --Home Lasix on hold, IV diuresis for now as above --Coreg changed to Lopressor as BP dropped significantly on Coreg dose sufficient enough to treat tachycardia --hold losartan, resume as BP will tolerate --IV Lopressor 5 mg PRN SBP>160 or HR>110   Type 2 diabetes mellitus, with long-term current use of insulin -  Hyperglycemia - still having CBG's in 300's, adjusting insulin past several days. --Holding empagliflozin, glipizide  --Increase Lantus to 25 units BID --Schedule Novolog 10>>16 units q4h + sliding scale  --follow CBG's, place back on SSI if above inpatient goal 140-180   Depression - continue home Paxil   Insomnia -  - cont Ativan 0.5 QHS- wife states often times he needs a second dose as 1 dose nothing for him- follow- increase as needed   History of Tuberculosis - no acute issue  Severe protein calorie malnutrition - Due to chronic  illnesses above.  Albumin 2.5.  On tube feeds as above.  Dietician following, see their recs.    Pressure Injury of Skin - Present on Admission WOC consulted - see wound care recs. Frequent repositioning at least every 2 hrs.  Pressure Injury 08/03/18 Stage II -  Partial thickness loss of dermis presenting as a shallow open ulcer with a red, pink wound bed without slough. This is patchy areas of full thickness skin loss related to moisture associated skin damage, NOT a pressur (Active)  08/03/18 0800  Location: Sacrum  Location Orientation: Medial  Staging: Stage II -  Partial thickness loss of dermis presenting as a shallow open ulcer with a red, pink wound bed without slough.  Wound Description (Comments): This is patchy areas of full thickness skin loss related to moisture associated skin damage, NOT a pressure injury  Present on Admission: No     Pressure Injury 04/03/2021 Sacrum Medial Stage 1 -  Intact skin with non-blanchable redness of a localized area usually over a bony prominence. non-blanchable (Active)  04/08/2021 1600  Location: Sacrum  Location Orientation: Medial  Staging: Stage 1 -  Intact skin with non-blanchable redness of a localized area usually over a bony prominence.  Wound Description (Comments): non-blanchable  Present on Admission: Yes  Patient BMI: Body mass index is 25.98 kg/m.   DVT prophylaxis: enoxaparin (LOVENOX) injection 40 mg Start: 04/03/21 1000   Diet:  Diet Orders (From admission, onward)     Start     Ordered   03/31/21 1044  Diet NPO time specified Except for: Sips with Meds  Diet effective now       Comments: Medication in puree  Question:  Except for  Answer:  Ferrel Logan with Meds   03/31/21 1044              Code Status: DNR   Brief Narrative / Hospital Course to Date:   Wayne Young is a 65 year old male with rheumatoid arthritis, ALS with a trach on home vent, diabetes mellitus, hypertension. Presented due to oxygen  desaturations into the 80s after nebulizer treatments.  In the ED, patient was febrile with temp 101 F, tachypneic and mildly tachycardic, with leukocytosis on CBC and  hypokalemia.  Anasarca noted on exam.  CT scan of the chest showed extensive left lung opacities and debris in the mainstem bronchus.    Started on vancomycin and cefepime for sepsis secondary to pneumonia.  Admitted to [facility] service.    Pulmonology consulted and following.      Subjective 04/07/21    Patient was awake and engaged with me on rounds this AM.  Attempted to elicit his wishes for his care.   When asked, he blinks once for "yes", twice for "no".  It was clear that he understood this, but it was a bit difficult to distinguish 1 vs 2 blinks as he appeared to have some eye irritation causing him to blink more.  It was clearly 1 blink yes when I asked if he has been listening to everyone talking about his care.  More interactive today that I've seen him past six days.   Disposition Plan & Communication   Status is: Inpatient  Remains inpatient appropriate because:Ongoing active pain requiring inpatient pain management and IV treatments appropriate due to intensity of illness or inability to take PO  Dispo: The patient is from: Home              Anticipated d/c is to: Home              Patient currently is not medically stable to d/c.   Difficult to place patient No  Family Communication: none at bedside on AM rounds.  6/20: met with wife, son and daughter again at bedside this afternoon to discuss clinical status and progress. We discuss potential clinical course trajectory - improvement to prior baseline, improvement to a new less functional baseline, vs persistent severe illness with continued decline.  They want to give time for a full course of antibiotics since we see further improvement today and he is interacting somewhat again.  Consults, Procedures, Significant Events   Consultants:   PCCM Interventional radiology Neurology Palliative care  Procedures:  G-tube placement 6/15 Bronchoscopy  Antimicrobials:  Anti-infectives (From admission, onward)    Start     Dose/Rate Route Frequency Ordered Stop   04/05/21 0930  meropenem (MERREM) 1 g in sodium chloride 0.9 % 100 mL IVPB        1 g 200 mL/hr over 30 Minutes Intravenous Every 8 hours 04/05/21 0843 04/10/21 0559   04/02/21 1540  ceFAZolin (ANCEF) IVPB 2g/100 mL premix        over 30 Minutes Intravenous Continuous PRN 04/02/21 1548 04/02/21 1540   04/01/21 1230  Ampicillin-Sulbactam (UNASYN) 3  g in sodium chloride 0.9 % 100 mL IVPB  Status:  Discontinued        3 g 200 mL/hr over 30 Minutes Intravenous Every 8 hours 04/01/21 1140 04/05/21 0824   03/31/21 2300  ceFEPIme (MAXIPIME) 2 g in sodium chloride 0.9 % 100 mL IVPB  Status:  Discontinued        2 g 200 mL/hr over 30 Minutes Intravenous Every 8 hours 03/31/21 1542 04/01/21 1044   03/31/21 2200  vancomycin (VANCOREADY) IVPB 750 mg/150 mL  Status:  Discontinued        750 mg 150 mL/hr over 60 Minutes Intravenous Every 12 hours 03/31/21 1143 04/01/21 1044   03/31/21 1045  vancomycin (VANCOREADY) IVPB 1250 mg/250 mL        1,250 mg 166.7 mL/hr over 90 Minutes Intravenous  Once 03/31/21 0957 03/31/21 1302   03/31/2021 1700  vancomycin (VANCOREADY) IVPB 1000 mg/200 mL  Status:  Discontinued        1,000 mg 200 mL/hr over 60 Minutes Intravenous Every 12 hours 03/27/2021 0943 04/06/2021 1115   04/01/2021 1300  ceFEPIme (MAXIPIME) 2 g in sodium chloride 0.9 % 100 mL IVPB  Status:  Discontinued        2 g 200 mL/hr over 30 Minutes Intravenous Every 8 hours 03/28/2021 0833 03/31/21 1542   04/01/2021 1300  vancomycin (VANCOREADY) IVPB 1500 mg/300 mL  Status:  Discontinued        1,500 mg 150 mL/hr over 120 Minutes Intravenous Every 8 hours 03/24/2021 0941 03/25/2021 0943   04/10/2021 0530  vancomycin (VANCOREADY) IVPB 1000 mg/200 mL  Status:  Discontinued        1,000 mg 200 mL/hr  over 60 Minutes Intravenous  Once 04/05/2021 0517 04/02/2021 0518   04/13/2021 0530  ceFEPIme (MAXIPIME) 2 g in sodium chloride 0.9 % 100 mL IVPB        2 g 200 mL/hr over 30 Minutes Intravenous  Once 03/24/2021 0517 03/31/2021 0549   04/14/2021 0530  vancomycin (VANCOREADY) IVPB 1250 mg/250 mL        1,250 mg 166.7 mL/hr over 90 Minutes Intravenous  Once 03/31/2021 0518 04/09/2021 0728         Micro    Objective   Vitals:   04/07/21 1441 04/07/21 1600 04/07/21 1622 04/07/21 1700  BP:  114/67 114/67 124/63  Pulse:  (!) 103 (!) 108 (!) 102  Resp:  _0 Temp:  98.3 F (36.8 C)    TempSrc:  Oral    SpO2: 97% 97% 98% 97%  Weight:      Height:        Intake/Output Summary (Last 24 hours) at 04/07/2021 1751 Last data filed at 04/07/2021 1700 Gross per 24 hour  Intake 3678.59 ml  Output 2350 ml  Net 1328.59 ml   Filed Weights   03/30/21 0600 04/04/21 0500 04/07/21 0139  Weight: 79.7 kg 79.7 kg 79.8 kg    Physical Exam:  General exam: eyes open for almost entire encounter, communicating with blinking eyes, trying to speak, most interactive I've seen him all week HEENT: trach in place on vent, secretions not as dark Respiratory system: b/l rhonchi, no wheezes, vent on 60% FiO2 Cardiovascular system: tachycardic but improved to 100's on monitor, regular rhythm, 2+ DP pulses b/l Gastrointestinal system: less distended, non-tender, G tube in place. Extremities: no lower extremity edema, SCD's in place, offloading boots on b/l feet, b/l upper extremities with mildly improved edema, b/l UE rash improving  Labs   Data Reviewed: I have personally reviewed following labs and imaging studies  CBC: Recent Labs  Lab 04/03/21 0037 04/04/21 0603 04/04/21 1114 04/05/21 0702 04/06/21 0101 04/07/21 0125  WBC 9.9 10.2  --  10.4 12.6* 15.0*  NEUTROABS  --   --   --   --  9.6*  --   HGB 13.9 14.0 12.2* 12.8* 13.5 12.5*  HCT 44.4 42.6 36.0* 38.8* 40.9 40.3  MCV 83.8 79.3*  --  78.9* 80.5  82.4  PLT 238 249  --  238 233 269   Basic Metabolic Panel: Recent Labs  Lab 04/03/21 0037 04/03/21 1402 04/03/21 1622 04/04/21 0603 04/04/21 1114 04/04/21 1635 04/05/21 0702 04/06/21 0101 04/06/21 1204 04/06/21 1605 04/07/21 0125  NA 147*  --   --  147* 152*  --  148* 148* 141  --  148*  K 3.3*  --   --  2.9* 2.8*  --  3.5 3.5  --  3.6 4.2  CL 118*  --   --  116*  --   --  109 107  --   --  107  CO2 16*  --   --  24  --   --  32 37*  --   --  35*  GLUCOSE 164*  --   --  323*  --   --  330* 343*  --   --  299*  BUN 5*  --   --  6*  --   --  6* 10  --   --  14  CREATININE 0.61  --   --  0.31*  --   --  <0.30* <0.30*  --   --  <0.30*  CALCIUM 9.7  --   --  9.4  --   --  9.0 8.8*  --   --  8.5*  MG 2.0   < > 2.0 2.2  --  2.0  --  2.1  --   --  2.2  PHOS  --    < > <1.0* 1.1*  --  1.9*  --  <1.0* 1.9*  --  1.7*   < > = values in this interval not displayed.   GFR: CrCl cannot be calculated (This lab value cannot be used to calculate CrCl because it is not a number: <0.30). Liver Function Tests: Recent Labs  Lab 04/01/21 1831  AST 9*  ALT 11  ALKPHOS 79  BILITOT 2.2*  PROT 7.6  ALBUMIN 2.5*   No results for input(s): LIPASE, AMYLASE in the last 168 hours. No results for input(s): AMMONIA in the last 168 hours.  Coagulation Profile: No results for input(s): INR, PROTIME in the last 168 hours.  Cardiac Enzymes: No results for input(s): CKTOTAL, CKMB, CKMBINDEX, TROPONINI in the last 168 hours. BNP (last 3 results) No results for input(s): PROBNP in the last 8760 hours. HbA1C: No results for input(s): HGBA1C in the last 72 hours. CBG: Recent Labs  Lab 04/06/21 2340 04/07/21 0325 04/07/21 0738 04/07/21 1138 04/07/21 1558  GLUCAP 301* 259* 284* 290* 309*   Lipid Profile: No results for input(s): CHOL, HDL, LDLCALC, TRIG, CHOLHDL, LDLDIRECT in the last 72 hours.  Thyroid Function Tests: No results for input(s): TSH, T4TOTAL, FREET4, T3FREE, THYROIDAB in the  last 72 hours. Anemia Panel: No results for input(s): VITAMINB12, FOLATE, FERRITIN, TIBC, IRON, RETICCTPCT in the last 72 hours. Sepsis Labs: Recent Labs  Lab 04/01/21 1831 04/06/21 0808  PROCALCITON  --  0.73  LATICACIDVEN 0.9  --     Recent Results (from the past 240 hour(s))  Urine culture     Status: Abnormal   Collection Time: 03/21/2021  3:35 AM   Specimen: In/Out Cath Urine  Result Value Ref Range Status   Specimen Description IN/OUT CATH URINE  Final   Special Requests   Final    NONE Performed at [facility], [address], [place], [place] [address]    Culture MULTIPLE SPECIES PRESENT, SUGGEST RECOLLECTION (A)  Final   Report Status 03/30/2021 FINAL  Final  Resp Panel by RT-PCR (Flu A&B, Covid) Nasopharyngeal Swab     Status: None   Collection Time: 03/25/2021  3:52 AM   Specimen: Nasopharyngeal Swab; Nasopharyngeal(NP) swabs in vial transport medium  Result Value Ref Range Status   SARS Coronavirus 2 by RT PCR NEGATIVE NEGATIVE Final    Comment: (NOTE) SARS-CoV-2 target nucleic acids are NOT DETECTED.  The SARS-CoV-2 RNA is generally detectable in upper respiratory specimens during the acute phase of infection. The lowest concentration of SARS-CoV-2 viral copies this assay can detect is 138 copies/mL. A negative result does not preclude SARS-Cov-2 infection and should not be used as the sole basis for treatment or other patient management decisions. A negative result may occur with  improper specimen collection/handling, submission of specimen other than nasopharyngeal swab, presence of viral mutation(s) within the areas targeted by this assay, and inadequate number of viral copies(<138 copies/mL). A negative result must be combined with clinical observations, patient history, and epidemiological information. The expected result is Negative.  Fact Sheet for Patients:  EntrepreneurPulse.com.au  Fact Sheet for Healthcare Providers:   IncredibleEmployment.be  This test is no t yet approved or cleared by the [place] FDA and  has been authorized for detection and/or diagnosis of SARS-CoV-2 by FDA under an Emergency Use Authorization (EUA). This EUA will remain  in effect (meaning this test can be used) for the duration of the COVID-19 declaration under Section 564(b)(1) of the Act, 21 U.S.C.section 360bbb-3(b)(1), unless the authorization is terminated  or revoked sooner.       Influenza A by PCR NEGATIVE NEGATIVE Final   Influenza B by PCR NEGATIVE NEGATIVE Final    Comment: (NOTE) The Xpert Xpress SARS-CoV-2/FLU/RSV plus assay is intended as an aid in the diagnosis of influenza from Nasopharyngeal swab specimens and should not be used as a sole basis for treatment. Nasal washings and aspirates are unacceptable for Xpert Xpress SARS-CoV-2/FLU/RSV testing.  Fact Sheet for Patients: EntrepreneurPulse.com.au  Fact Sheet for Healthcare Providers: IncredibleEmployment.be  This test is not yet approved or cleared by the [place] FDA and has been authorized for detection and/or diagnosis of SARS-CoV-2 by FDA under an Emergency Use Authorization (EUA). This EUA will remain in effect (meaning this test can be used) for the duration of the COVID-19 declaration under Section 564(b)(1) of the Act, 21 U.S.C. section 360bbb-3(b)(1), unless the authorization is terminated or revoked.  Performed at [facility], [place] [address], [place], [place] [address]   Blood Culture (routine x 2)     Status: Abnormal   Collection Time: 04/16/2021  5:08 AM   Specimen: BLOOD  Result Value Ref Range Status   Specimen Description   Final    BLOOD LEFT ANTECUBITAL Performed at [facility], [address] [place], [place] [address]    Special Requests   Final    BOTTLES DRAWN AEROBIC AND ANAEROBIC Blood Culture adequate volume  Performed at [facility], [address] [place], [place] [address]    Culture  Setup Time   Final    GRAM POSITIVE COCCI IN CLUSTERS IN BOTH AEROBIC AND ANAEROBIC BOTTLES CRITICAL RESULT CALLED TO, READ BACK BY AND VERIFIED WITH: V. BRYK,PHARMD [id number] 03/30/2021 T. TYSOR    Culture (A)  Final    STAPHYLOCOCCUS CAPITIS THE SIGNIFICANCE OF ISOLATING THIS ORGANISM FROM A SINGLE SET OF BLOOD CULTURES WHEN MULTIPLE SETS ARE DRAWN IS UNCERTAIN. PLEASE NOTIFY THE MICROBIOLOGY DEPARTMENT WITHIN ONE WEEK IF SPECIATION AND SENSITIVITIES ARE REQUIRED. Performed at [facility], [place] [address], [place], [place] [address]    Report Status 04/01/2021 FINAL  Final  Blood Culture ID Panel (Reflexed)     Status: Abnormal   Collection Time: 03/26/2021  5:08 AM  Result Value Ref Range Status   Enterococcus faecalis NOT DETECTED NOT DETECTED Final   Enterococcus Faecium NOT DETECTED NOT DETECTED Final   Listeria monocytogenes NOT DETECTED NOT DETECTED Final   Staphylococcus species DETECTED (A) NOT DETECTED Final    Comment: CRITICAL RESULT CALLED TO, READ BACK BY AND VERIFIED WITH: VBeecher Mcardle [id number] 03/30/2021 T. TYSOR    Staphylococcus aureus (BCID) NOT DETECTED NOT DETECTED Final   Staphylococcus epidermidis NOT DETECTED NOT DETECTED Final   Staphylococcus lugdunensis NOT DETECTED NOT DETECTED Final   Streptococcus species NOT DETECTED NOT DETECTED Final   Streptococcus agalactiae NOT DETECTED NOT DETECTED Final   Streptococcus pneumoniae NOT DETECTED NOT DETECTED Final   Streptococcus pyogenes NOT DETECTED NOT DETECTED Final   A.calcoaceticus-baumannii NOT DETECTED NOT DETECTED Final   Bacteroides fragilis NOT DETECTED NOT DETECTED Final   Enterobacterales NOT DETECTED NOT DETECTED Final   Enterobacter cloacae complex NOT DETECTED NOT DETECTED Final   Escherichia coli NOT DETECTED NOT DETECTED Final   Klebsiella aerogenes NOT DETECTED NOT DETECTED Final   Klebsiella oxytoca NOT DETECTED NOT DETECTED  Final   Klebsiella pneumoniae NOT DETECTED NOT DETECTED Final   Proteus species NOT DETECTED NOT DETECTED Final   Salmonella species NOT DETECTED NOT DETECTED Final   Serratia marcescens NOT DETECTED NOT DETECTED Final   Haemophilus influenzae NOT DETECTED NOT DETECTED Final   Neisseria meningitidis NOT DETECTED NOT DETECTED Final   Pseudomonas aeruginosa NOT DETECTED NOT DETECTED Final   Stenotrophomonas maltophilia NOT DETECTED NOT DETECTED Final   Candida albicans NOT DETECTED NOT DETECTED Final   Candida auris NOT DETECTED NOT DETECTED Final   Candida glabrata NOT DETECTED NOT DETECTED Final   Candida krusei NOT DETECTED NOT DETECTED Final   Candida parapsilosis NOT DETECTED NOT DETECTED Final   Candida tropicalis NOT DETECTED NOT DETECTED Final   Cryptococcus neoformans/gattii NOT DETECTED NOT DETECTED Final    Comment: Performed at [facility], [address], [place], [place] [address]  Expectorated Sputum Assessment w Gram Stain, Rflx to Resp Cult     Status: None   Collection Time: 04/04/2021  8:31 AM   Specimen: Expectorated Sputum  Result Value Ref Range Status   Specimen Description EXPECTORATED SPUTUM  Final   Special Requests NONE  Final   Sputum evaluation   Final    THIS SPECIMEN IS ACCEPTABLE FOR SPUTUM CULTURE Performed at [facility], [place] [address], [place], [place] [address]    Report Status 04/14/2021 FINAL  Final  MRSA Next Gen by PCR, Nasal     Status: None   Collection Time: 04/04/2021  8:31 AM  Result Value Ref  Range Status   MRSA by PCR Next Gen NOT DETECTED NOT DETECTED Final    Comment: (NOTE) The GeneXpert MRSA Assay (FDA approved for NASAL specimens only), is one component of a comprehensive MRSA colonization surveillance program. It is not intended to diagnose MRSA infection nor to guide or monitor treatment for MRSA infections. Test performance is not FDA approved in patients less than 41 years old. Performed at [facility],  [place] [address], [place], [place] [address]   Culture, Respiratory w Gram Stain     Status: None   Collection Time: 04/10/2021  8:31 AM  Result Value Ref Range Status   Specimen Description EXPECTORATED SPUTUM  Final   Special Requests NONE Reflexed from [id number]  Final   Gram Stain   Final    ABUNDANT WBC PRESENT, PREDOMINANTLY PMN RARE GRAM POSITIVE COCCI Performed at [facility], [place] [address], [place], [place] [address]    Culture   Final    RARE STAPHYLOCOCCUS AUREUS RARE PROTEUS MIRABILIS    Report Status 04/01/2021 FINAL  Final   Organism ID, Bacteria STAPHYLOCOCCUS AUREUS  Final   Organism ID, Bacteria PROTEUS MIRABILIS  Final      Susceptibility   Proteus mirabilis - MIC*    AMPICILLIN <=2 SENSITIVE Sensitive     CEFAZOLIN <=4 SENSITIVE Sensitive     CEFEPIME <=0.12 SENSITIVE Sensitive     CEFTAZIDIME <=1 SENSITIVE Sensitive     CEFTRIAXONE <=0.25 SENSITIVE Sensitive     CIPROFLOXACIN <=0.25 SENSITIVE Sensitive     GENTAMICIN <=1 SENSITIVE Sensitive     IMIPENEM 8 INTERMEDIATE Intermediate     TRIMETH/SULFA <=20 SENSITIVE Sensitive     AMPICILLIN/SULBACTAM <=2 SENSITIVE Sensitive     PIP/TAZO <=4 SENSITIVE Sensitive     * RARE PROTEUS MIRABILIS   Staphylococcus aureus - MIC*    CIPROFLOXACIN <=0.5 SENSITIVE Sensitive     ERYTHROMYCIN <=0.25 SENSITIVE Sensitive     GENTAMICIN <=0.5 SENSITIVE Sensitive     OXACILLIN 0.5 SENSITIVE Sensitive     TETRACYCLINE >=16 RESISTANT Resistant     VANCOMYCIN 1 SENSITIVE Sensitive     TRIMETH/SULFA <=10 SENSITIVE Sensitive     CLINDAMYCIN <=0.25 SENSITIVE Sensitive     RIFAMPIN <=0.5 SENSITIVE Sensitive     Inducible Clindamycin NEGATIVE Sensitive     * RARE STAPHYLOCOCCUS AUREUS  Blood Culture (routine x 2)     Status: None   Collection Time: 03/21/2021  9:38 AM   Specimen: BLOOD RIGHT HAND  Result Value Ref Range Status   Specimen Description BLOOD RIGHT HAND  Final   Special Requests   Final    BOTTLES DRAWN AEROBIC AND  ANAEROBIC Blood Culture adequate volume   Culture   Final    NO GROWTH 5 DAYS Performed at [facility], [address] [place], [place] [address]    Report Status 04/03/2021 FINAL  Final  Culture, Respiratory w Gram Stain     Status: None   Collection Time: 04/02/21  2:05 PM   Specimen: Tracheal Aspirate; Respiratory  Result Value Ref Range Status   Specimen Description TRACHEAL ASPIRATE  Final   Special Requests NONE  Final   Gram Stain   Final    ABUNDANT WBC PRESENT,BOTH PMN AND MONONUCLEAR FEW GRAM NEGATIVE RODS FEW GRAM VARIABLE ROD RARE YEAST Performed at [facility], [place] [address] [place], [place] [address]    Culture MODERATE PSEUDOMONAS AERUGINOSA  Final   Report Status 04/06/2021 FINAL  Final   Organism ID, Bacteria PSEUDOMONAS AERUGINOSA  Final      Susceptibility   Pseudomonas aeruginosa - MIC*    CEFTAZIDIME 4 SENSITIVE Sensitive     CIPROFLOXACIN 1 SENSITIVE Sensitive     GENTAMICIN <=1 SENSITIVE Sensitive     IMIPENEM <=0.25 SENSITIVE Sensitive     PIP/TAZO <=4 SENSITIVE Sensitive     CEFEPIME 2 SENSITIVE Sensitive     * MODERATE PSEUDOMONAS AERUGINOSA      Imaging Studies   No results found.   Medications   Scheduled Meds:  aspirin  81 mg Per Tube Daily   chlorhexidine gluconate (MEDLINE KIT)  15 mL Mouth Rinse BID   Chlorhexidine Gluconate Cloth  6 each Topical Q2000   enoxaparin (LOVENOX) injection  40 mg Subcutaneous Q24H   feeding supplement (PROSource TF)  45 mL Per Tube BID   folic acid  1 mg Intravenous Daily   free water  250 mL Per Tube Q4H   furosemide  40 mg Intravenous BID   hydrocortisone cream   Topical TID   insulin aspart  0-15 Units Subcutaneous Q4H   insulin aspart  16 Units Subcutaneous Q4H   insulin glargine  25 Units Subcutaneous Daily   ipratropium-albuterol  3 mL Nebulization TID   latanoprost  1 drop Both Eyes QHS   LORazepam  0.5 mg Per Tube QHS   mouth rinse  15 mL Mouth Rinse 10 times per day    metoCLOPramide (REGLAN) injection  10 mg Intravenous Q8H   metoprolol tartrate  25 mg Per Tube BID   PARoxetine  20 mg Per Tube q morning   phosphorus  250 mg Per Tube TID   polyvinyl alcohol  1 drop Both Eyes TID   senna-docusate  1 tablet Per Tube QHS   Continuous Infusions:  famotidine (PEPCID) IV Stopped (04/07/21 1002)   feeding supplement (OSMOLITE 1.5 CAL) 1,000 mL (04/07/21 1625)   meropenem (MERREM) IV Stopped (04/07/21 1425)       LOS: 9 days    Time spent: 45 minutes with greater than 50% spent at bedside and in coordination of care.    Ezekiel Slocumb, DO [facility]  04/07/2021, 5:51 PM      If 7PM-7AM, please contact night-coverage. How to contact the [facility] Attending or Consulting provider [place] or covering provider during after hours [place], for this patient?    Check the care team in [facility] and look for a) attending/consulting [facility] provider listed and b) the [facility] team listed Log into [email or web address] and use [place] universal password to access. If you do not have the password, please contact the hospital operator. Locate the [facility] provider you are looking for under [facility] and page to a number that you can be directly reached. If you still have difficulty reaching the provider, please page the [facility] (Director on Call) for the Hospitalists listed on amion for assistance.

## 2021-04-08 DIAGNOSIS — E43 Unspecified severe protein-calorie malnutrition: Secondary | ICD-10-CM | POA: Diagnosis not present

## 2021-04-08 DIAGNOSIS — J9621 Acute and chronic respiratory failure with hypoxia: Secondary | ICD-10-CM | POA: Diagnosis not present

## 2021-04-08 DIAGNOSIS — Z515 Encounter for palliative care: Secondary | ICD-10-CM | POA: Diagnosis not present

## 2021-04-08 DIAGNOSIS — G1221 Amyotrophic lateral sclerosis: Secondary | ICD-10-CM | POA: Diagnosis not present

## 2021-04-08 LAB — PHOSPHORUS: Phosphorus: 2.6 mg/dL (ref 2.5–4.6)

## 2021-04-08 LAB — BASIC METABOLIC PANEL
Anion gap: 12 (ref 5–15)
BUN: 19 mg/dL (ref 8–23)
CO2: 36 mmol/L — ABNORMAL HIGH (ref 22–32)
Calcium: 8.7 mg/dL — ABNORMAL LOW (ref 8.9–10.3)
Chloride: 99 mmol/L (ref 98–111)
Creatinine, Ser: <0.3 mg/dL — ABNORMAL LOW (ref 0.61–1.24)
Glucose, Bld: 229 mg/dL — ABNORMAL HIGH (ref 70–99)
Potassium: 3.3 mmol/L — ABNORMAL LOW (ref 3.5–5.1)
Sodium: 147 mmol/L — ABNORMAL HIGH (ref 135–145)

## 2021-04-08 LAB — CBC
HCT: 40.4 % (ref 39.0–52.0)
Hemoglobin: 12.6 g/dL — ABNORMAL LOW (ref 13.0–17.0)
MCH: 25.9 pg — ABNORMAL LOW (ref 26.0–34.0)
MCHC: 31.2 g/dL (ref 30.0–36.0)
MCV: 83.1 fL (ref 80.0–100.0)
Platelets: 202 10*3/uL (ref 150–400)
RBC: 4.86 MIL/uL (ref 4.22–5.81)
RDW: 18.1 % — ABNORMAL HIGH (ref 11.5–15.5)
WBC: 14.4 10*3/uL — ABNORMAL HIGH (ref 4.0–10.5)
nRBC: 0 % (ref 0.0–0.2)

## 2021-04-08 LAB — GLUCOSE, CAPILLARY
Glucose-Capillary: 273 mg/dL — ABNORMAL HIGH (ref 70–99)
Glucose-Capillary: 212 mg/dL — ABNORMAL HIGH (ref 70–99)
Glucose-Capillary: 185 mg/dL — ABNORMAL HIGH (ref 70–99)
Glucose-Capillary: 169 mg/dL — ABNORMAL HIGH (ref 70–99)
Glucose-Capillary: 165 mg/dL — ABNORMAL HIGH (ref 70–99)
Glucose-Capillary: 142 mg/dL — ABNORMAL HIGH (ref 70–99)

## 2021-04-08 LAB — MAGNESIUM: Magnesium: 2.1 mg/dL (ref 1.7–2.4)

## 2021-04-08 MED ORDER — POTASSIUM CHLORIDE 20 MEQ PO PACK
40.0000 meq | PACK | ORAL | Status: AC
  Administered 2021-04-08 (×2): 40 meq via ORAL
  Filled 2021-04-08 (×2): qty 2

## 2021-04-08 NOTE — Progress Notes (Signed)
Pharmacy Antibiotic Note  Wayne Young is a 65 y.o. male w/ ALS and home trach admitted on Apr 02, 2021 with increased secretions. Pt was on Unasyn for MSSA and proteus in respiratory cultures, now with Pseudomonas growing. Pharmacy has been consulted for meropenem dosing - set to end tomorrow.  Plan: Meropenem 1g IV q8h Pharmacy will sign off   Height: 5\' 9"  (175.3 cm) Weight: 82.1 kg (181 lb) IBW/kg (Calculated) : 70.7  Temp (24hrs), Avg:99.3 F (37.4 C), Min:98.3 F (36.8 C), Max:100.6 F (38.1 C)  Recent Labs  Lab 04/01/21 1831 04/02/21 0153 04/04/21 0603 04/05/21 0702 04/06/21 0101 04/07/21 0125 04/08/21 0457  WBC 12.3*   < > 10.2 10.4 12.6* 15.0* 14.4*  CREATININE 0.58*   < > 0.31* <0.30* <0.30* <0.30* <0.30*  LATICACIDVEN 0.9  --   --   --   --   --   --    < > = values in this interval not displayed.     CrCl cannot be calculated (This lab value cannot be used to calculate CrCl because it is not a number: <0.30).    No Known Allergies  Antimicrobials this admission: Cefepime 6/11 > 6/14 Vanc 6/11 > 6/11 resume 6/13 > 6/14 Unasyn 6/14 > 6/18 Meropenem 6/18 >>  Microbiology results: 6/11 UCx > multiple species 6/11 BCx x 2 > staph captvi 6/11 Resp Cx>  proteus S all except tetrac and staph aureus S all except imipen  6/11 MRSA PCR > neg 6/15 TA > rare Pseudomonas  Thank you for allowing pharmacy to be a part of this patient's care.   7/15, PharmD, BCPS, [facility] Clinical Pharmacist [phone] Please check AMION for all [facility] Pharm[facility] numbers 04/08/2021

## 2021-04-08 NOTE — Progress Notes (Signed)
Daily Progress Note   Patient Name: Wayne Young       Date: 04/08/2021 DOB: 04/16/1956  Age: 65 y.o. MRN#: [id number] Attending Physician: Ezekiel Slocumb, DO Primary Care Physician: Cipriano Mile, NP Admit Date: 04/05/2021  Reason for Consultation/Follow-up: Establishing goals of care  Subjective: Awake- mouths, "how are you?" Denies pain or discomfort.  No family at bedside  Review of Systems  Unable to perform ROS: Acuity of condition   Length of Stay: 10  Current Medications: Scheduled Meds:  . aspirin  81 mg Per Tube Daily  . chlorhexidine gluconate (MEDLINE KIT)  15 mL Mouth Rinse BID  . Chlorhexidine Gluconate Cloth  6 each Topical Q2000  . enoxaparin (LOVENOX) injection  40 mg Subcutaneous Q24H  . feeding supplement (PROSource TF)  45 mL Per Tube BID  . folic acid  1 mg Intravenous Daily  . free water  250 mL Per Tube Q4H  . furosemide  40 mg Intravenous BID  . hydrocortisone cream   Topical TID  . insulin aspart  0-15 Units Subcutaneous Q4H  . insulin aspart  16 Units Subcutaneous Q4H  . insulin glargine  25 Units Subcutaneous BID  . ipratropium-albuterol  3 mL Nebulization TID  . latanoprost  1 drop Both Eyes QHS  . LORazepam  0.5 mg Per Tube QHS  . mouth rinse  15 mL Mouth Rinse 10 times per day  . metoCLOPramide (REGLAN) injection  10 mg Intravenous Q8H  . metoprolol tartrate  25 mg Per Tube BID  . PARoxetine  20 mg Per Tube q morning  . polyvinyl alcohol  1 drop Both Eyes TID  . senna-docusate  1 tablet Per Tube QHS    Continuous Infusions: . famotidine (PEPCID) IV 20 mg (04/08/21 0829)  . feeding supplement (OSMOLITE 1.5 CAL) 1,000 mL (04/08/21 0828)  . meropenem (MERREM) IV 1 g (04/08/21 1317)    PRN Meds: acetaminophen, labetalol, metoprolol  tartrate, mineral oil-hydrophilic petrolatum, polyvinyl alcohol  Physical Exam Vitals and nursing note reviewed.            Vital Signs: BP 129/71   Pulse (!) 101   Temp 99 F (37.2 C) (Oral)   Resp 16   Ht 5' 9"  (1.753 m)   Wt 82.1 kg   SpO2 98%   BMI 26.73 kg/m  SpO2: SpO2: 98 % O2 Device: O2 Device: Ventilator O2 Flow Rate: O2 Flow Rate (L/min): 0 L/min  Intake/output summary:  Intake/Output Summary (Last 24 hours) at 04/08/2021 1515 Last data filed at 04/08/2021 1300 Gross per 24 hour  Intake 1610.69 ml  Output 2050 ml  Net -439.31 ml    LBM: Last BM Date: 04/08/21 Baseline Weight: Weight: 68 kg Most recent weight: Weight: 82.1 kg       Palliative Assessment/Data: PPS: 10%      Patient Active Problem List   Diagnosis Date Noted  . Dysphagia   . Cerebral thrombosis with cerebral infarction 04/02/2021  . CAP (community acquired pneumonia) 04/12/2021  . Hypokalemia 04/05/2021  . 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 Hypoalbuminemia 04/17/2021  . History of TB (tuberculosis) 03/31/2021  . Acute on chronic respiratory failure with hypoxia ([place]) 03/22/2021  . ALS (amyotrophic lateral sclerosis) ([place]) 02/07/2021  . Acute on chronic respiratory failure with hypoxemia ([place]) 08/09/2019  . Hyperlipidemia 08/09/2019  . Hypertension 08/09/2019  . Type 2 diabetes mellitus, with long-term current use of insulin ([place]) 08/09/2019  . Consolidation lung ([place])   . Acute respiratory distress   . Encounter for imaging study to confirm orogastric (OG) tube placement   . Self extubation   . Aspiration into airway   . Pressure injury of skin 08/04/2018  . Atelectasis of left lung   . Protein-calorie malnutrition, severe 07/28/2018  . Recent unintentional weight loss over several months   . Acute respiratory failure ([place]) 07/25/2018  . 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 Rheumatoid arthritis with positive rheumatoid  factor ([place]) 07/13/2018  . Incidental pulmonary nodule, > 51m and < 868m09/25/2019  . 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 Weight loss 07/13/2018  . Chronic midline low back pain without sciatica 12/31/2017  . High risk medication use 06/16/2017  . History of rheumatoid arthritis 06/16/2017    Palliative Care Assessment & Plan   Patient Profile:  [age].o. male  with past medical history of ALS, wheelchair and vent dependent, HTN, DM2, RA, glaucoma, blind in R eye admitted on 03/31/2021 with pneumonia- likely aspiration due to worsening dysphagia from ALS. Palliative consulted to assist with discussion re: PEG tube vs hospice.    Assessment/Recommendations/Plan  Continue current interventions Complete course of meropenem and then re-eval GOC  Goals of Care and Additional Recommendations: Limitations on Scope of Treatment: Full Scope Treatment  Code Status: DNR  Prognosis:  Unable to determine  Discharge Planning: To Be Determined  Care plan was discussed with patient and care team  Thank you for allowing the Palliative Medicine Team to assist in the care of this patient.   Total time: 17 minutes Greater than 50%  of this time was spent counseling and coordinating care related to the above assessment and plan.  KaMariana KaufmanAGNP-C Palliative Medicine   Please contact Palliative Medicine Team phone at [phone] questions and concerns.

## 2021-04-08 NOTE — Progress Notes (Signed)
PROGRESS NOTE    Wayne Young   GQQ:[id number]  DOB: 01-01-1956  PCP: Cipriano Mile, NP    DOA: 04/13/2021 LOS: 10   Assessment & Plan   Principal Problem:   Acute on chronic respiratory failure with hypoxia (HCC) Active Problems:   Rheumatoid arthritis with positive rheumatoid factor (HCC)   Protein-calorie malnutrition, severe   Pressure injury of skin   Acute on chronic respiratory failure with hypoxemia (HCC)   ALS (amyotrophic lateral sclerosis) ([facility])   Hyperlipidemia   Hypertension   Type 2 diabetes mellitus, with long-term current use of insulin ([place])   CAP (community acquired pneumonia)   Hypokalemia   Tracheostomy dependent (HCC)   Hypoalbuminemia   History of TB (tuberculosis)   Cerebral thrombosis with cerebral infarction   Dysphagia    Acute on chronic respiratory failure with hypoxia due to Aspiration pneumonia in chronic vent patient with  Severe sepsis present on admission  (As evidenced by tachycardia, leukocytosis, PNA on imaging and worsened hypoxia consistent with organ dysfunction) Immune compromised state 6/18 -- increased O2 requirement 40>>60% FiO2, desat  episodes and bradying down due to mucus plugging 6/19 -- improved with abx change to [facility] 6/20-21 -- further improvement, more alert & interactive, still on 50% FiO2 PCCM following, appreciated.  See their recs. Extensive consolidation in the left lung with a small effusion-central airway debris in both mainstem bronchi S/p  Flexible Bronchoscopy 6/15 - Strep pneumo urinary antigen negative, HIV negative --Sputum culture w/ rare staph aureus and Proteus mirabilis --6/18 Tracheal aspirate cx with Pseudomonas --Initially on vancomycin and cefepime >> Unasyn --6/18 Changed to Merrem for coverage and given rash ?allergy  --hypertonic saline nebs --Uses device called a "coughilator" at home wife was told she is welcome to bring it   ALS (amyotrophic lateral  sclerosis) Quadriplegic Ventilator dependent with tracheostomy Dysphagia with aspiration - NPO Pt wheelchair bound, sleeps reclined in his wheelchair and has a trach- wife & family are full time caregivers for him. - 6/16 > SLP re-evaluated and started dysphagia 1 diet w/nectar thickened liquids --Palliative care consulted, appreciate assistance with [place] talks - see their notes --G-tube placed by IR 6/15 --Tube feeds - started 6/16 --Free water 250 cc q4h - adjust based on Na, volume status  Sinus tachycardia - clearly sinus rhythm on monitor, but watch for A-fib.  Wife reports he sometimes has fast HR.  Suspect infection causing. --change Coreg to Lopressor for less BP lowering effect (b/c severe hypotension after Coreg multiple times) --monitor closely - get EKG if appearing A-fib on monitor --PRN IV Lopressor ordered  Right upper extremity erythema and ?rash - suspect drug rash ?allergy.  Antibiotics as above RUE venous duplex U/S negative for DVT.   6/21 - rash nearly resolved --Topical hydrocortisone.   --Monitor closely  Electrolyte Derangements - pharmacy consulted Please replace per tube / avoid extra volume with any IV replacement when possible.  Hypomagnesemia - resolved Hypophosphatemia - resolved Hypokalemia - recurrent w/ ongoing diuresis.   Prescribed 20 mEq K twice daily at home. --Monitor and replace PRN (per tube)  Hypernatremia - adjust free water flushes accordingly. Monitor BMP.  Has been stable upper 140's.  Anasarca - Takes 20 mg of Lasix daily; reports he drinks a lot of fluid at home and his wife agrees, hypoalbuminemia with third-spacing of fluid. Echo in Dec 2021 with EF 60 to 67% with mild LVH 6/18-19: increased abdominal distention and edema of extremities.  Good response to trial 20 mg IV  Lasix  6/20-21:  continues diuresing well on Lasix IV --Hold home Lasix  --Monitor volume status --Continue Lasix 40 mg IV BID --Possibly back off diuresis  tomorrow --Strict I/O's  Anion gap metabolic acidosis -Resolved. Unclear cause.  Normal lactate.  Not uremic.  Was not on diuretics at the time.  No ketonuria on UA.  Monitor.  Hold Jardiance. --d/c sodium bicarb  --Monitor BMP   Blood culture +Staphylococcus species Abnormal UA - ?UTI - Urine culture multiple species.  Single set of blood culture grew staph capitis and is likely a contaminant.  Monitor clinically.   Acute CVA - punctate ischemic in Left ACA territory on MRI. Neurology consulted and feels this is incidental finding.  Suspect due to small vessel disease.   --Started on ASA - continue at d/c --LDL 45, A1c 6.7% - both at goal  Right Eye RAPD - chronic, due to known blindness -  Diabetic retinopathy --See neurology note for details, confirmed by wife this is a known issue. --No further work up indicated   Rheumatoid arthritis with positive rheumatoid factor -  On methotrexate weekly on Thursday -Hold in setting of pneumonia   Hyperlipidemia - hold Lipitor while NPO   Hypertension - Home regimen: Coreg, losartan, Lasix --Home Lasix on hold --IV diuresis for now as above --Coreg changed to Lopressor as BP dropped significantly on Coreg dose sufficient enough to treat tachycardia --hold losartan, resume as BP will tolerate --Has PRN IV Lopressor and Labetalol   Type 2 diabetes mellitus, with long-term current use of insulin - Hyperglycemia - Finally improved today in upper 100's after titrating up insulin past few days. --Holding empagliflozin, glipizide  --Continue Lantus to 25 units BID --Continue Novolog 16 units q4h + sliding scale  --follow CBG's   Depression - continue home Paxil   Insomnia -  - cont Ativan 0.5 QHS- wife states often times he needs a second dose as 1 dose nothing for him- follow- increase as needed   History of Tuberculosis - no acute issue  Severe protein calorie malnutrition - Due to chronic illnesses above. Albumin 2.5.   --On tube  feeds as above.   --Dietician following, see recs    Pressure Injury of Skin - Present on Admission WOC consulted - see wound care recs. Frequent repositioning at least every 2 hrs.  Pressure Injury 08/03/18 Stage II -  Partial thickness loss of dermis presenting as a shallow open ulcer with a red, pink wound bed without slough. This is patchy areas of full thickness skin loss related to moisture associated skin damage, NOT a pressur (Active)  08/03/18 0800  Location: Sacrum  Location Orientation: Medial  Staging: Stage II -  Partial thickness loss of dermis presenting as a shallow open ulcer with a red, pink wound bed without slough.  Wound Description (Comments): This is patchy areas of full thickness skin loss related to moisture associated skin damage, NOT a pressure injury  Present on Admission: No     Pressure Injury 03/22/2021 Sacrum Medial Stage 1 -  Intact skin with non-blanchable redness of a localized area usually over a bony prominence. non-blanchable (Active)  04/02/2021 1600  Location: Sacrum  Location Orientation: Medial  Staging: Stage 1 -  Intact skin with non-blanchable redness of a localized area usually over a bony prominence.  Wound Description (Comments): non-blanchable  Present on Admission: Yes         Patient BMI: Body mass index is 26.73 kg/m.   DVT prophylaxis: enoxaparin (LOVENOX)  injection 40 mg Start: 04/03/21 1000   Diet:  Diet Orders (From admission, onward)     Start     Ordered   03/31/21 1044  Diet NPO time specified Except for: Sips with Meds  Diet effective now       Comments: Medication in puree  Question:  Except for  Answer:  Ferrel Logan with Meds   03/31/21 1044              Code Status: DNR   Brief Narrative / Hospital Course to Date:   Wayne Young is a 65 year old male with rheumatoid arthritis, ALS with a trach on home vent, diabetes mellitus, hypertension. Presented due to oxygen desaturations into the 80s after nebulizer  treatments.  In the ED, patient was febrile with temp 101 F, tachypneic and mildly tachycardic, with leukocytosis on CBC and  hypokalemia.  Anasarca noted on exam.  CT scan of the chest showed extensive left lung opacities and debris in the mainstem bronchus.    Started on vancomycin and cefepime for sepsis secondary to pneumonia.  Admitted to [facility] service.    Pulmonology consulted and following.      Subjective 04/08/21    Patient was awake and eyes open, wife at bedside.  Pt more interactive today vs yesterday, further improvement in his alertness.  Pt tracks with eyes as I walk to other side of bed to speak with his wife.    He had fever 100.6 last night, and WBC's up.  Discussed this with wife.  No acute events or desat episodes reported.   Disposition Plan & Communication   Status is: Inpatient  Remains inpatient appropriate because:Ongoing active pain requiring inpatient pain management and IV treatments appropriate due to intensity of illness or inability to take PO  Dispo: The patient is from: Home              Anticipated d/c is to: Home              Patient currently is not medically stable to d/c.   Difficult to place patient No  Family Communication: none at bedside on AM rounds.  See Palliative notes, and my PN's from 6/19, 6/20 re [place] discussions.  Plan to give full course antibiotics for pseudomonal PNA prior to any further decisions.  Patient's mentation improving over past two days.  Consults, Procedures, Significant Events   Consultants:  PCCM Interventional radiology Neurology Palliative care  Procedures:  G-tube placement 6/15 Bronchoscopy  Antimicrobials:  Anti-infectives (From admission, onward)    Start     Dose/Rate Route Frequency Ordered Stop   04/05/21 0930  meropenem (MERREM) 1 g in sodium chloride 0.9 % 100 mL IVPB        1 g 200 mL/hr over 30 Minutes Intravenous Every 8 hours 04/05/21 0843 04/10/21 0559   04/02/21 1540  ceFAZolin (ANCEF)  IVPB 2g/100 mL premix        over 30 Minutes Intravenous Continuous PRN 04/02/21 1548 04/02/21 1540   04/01/21 1230  Ampicillin-Sulbactam (UNASYN) 3 g in sodium chloride 0.9 % 100 mL IVPB  Status:  Discontinued        3 g 200 mL/hr over 30 Minutes Intravenous Every 8 hours 04/01/21 1140 04/05/21 0824   03/31/21 2300  ceFEPIme (MAXIPIME) 2 g in sodium chloride 0.9 % 100 mL IVPB  Status:  Discontinued        2 g 200 mL/hr over 30 Minutes Intravenous Every 8 hours 03/31/21 1542 04/01/21 1044  03/31/21 2200  vancomycin (VANCOREADY) IVPB 750 mg/150 mL  Status:  Discontinued        750 mg 150 mL/hr over 60 Minutes Intravenous Every 12 hours 03/31/21 1143 04/01/21 1044   03/31/21 1045  vancomycin (VANCOREADY) IVPB 1250 mg/250 mL        1,250 mg 166.7 mL/hr over 90 Minutes Intravenous  Once 03/31/21 0957 03/31/21 1302   03/19/2021 1700  vancomycin (VANCOREADY) IVPB 1000 mg/200 mL  Status:  Discontinued        1,000 mg 200 mL/hr over 60 Minutes Intravenous Every 12 hours 03/25/2021 0943 03/21/2021 1115   04/05/2021 1300  ceFEPIme (MAXIPIME) 2 g in sodium chloride 0.9 % 100 mL IVPB  Status:  Discontinued        2 g 200 mL/hr over 30 Minutes Intravenous Every 8 hours 03/22/2021 0833 03/31/21 1542   04/15/2021 1300  vancomycin (VANCOREADY) IVPB 1500 mg/300 mL  Status:  Discontinued        1,500 mg 150 mL/hr over 120 Minutes Intravenous Every 8 hours 04/04/2021 0941 04/03/2021 0943   04/12/2021 0530  vancomycin (VANCOREADY) IVPB 1000 mg/200 mL  Status:  Discontinued        1,000 mg 200 mL/hr over 60 Minutes Intravenous  Once 04/01/2021 0517 04/05/2021 0518   04/01/2021 0530  ceFEPIme (MAXIPIME) 2 g in sodium chloride 0.9 % 100 mL IVPB        2 g 200 mL/hr over 30 Minutes Intravenous  Once 04/06/2021 0517 04/01/2021 0549   04/09/2021 0530  vancomycin (VANCOREADY) IVPB 1250 mg/250 mL        1,250 mg 166.7 mL/hr over 90 Minutes Intravenous  Once 03/30/2021 0518 03/27/2021 0728         Micro    Objective   Vitals:    04/08/21 1300 04/08/21 1500 04/08/21 1527 04/08/21 1541  BP: 129/71 (!) 151/77 (!) 151/77   Pulse:  (!) 113 (!) 118 (!) 113  Resp: _0 Temp:    98.7 F (37.1 C)  TempSrc:    Axillary  SpO2:  97% 94% 95%  Weight:      Height:        Intake/Output Summary (Last 24 hours) at 04/08/2021 1606 Last data filed at 04/08/2021 1300 Gross per 24 hour  Intake 1610.69 ml  Output 2050 ml  Net -439.31 ml   Filed Weights   04/04/21 0500 04/07/21 0139 04/08/21 0400  Weight: 79.7 kg 79.8 kg 82.1 kg    Physical Exam:  General exam: awake through entire encounter, tracks with eyes, tries to speak, alert, interactive HEENT: trach in place on vent, minimal visible secretions in tubing or canister at this time Respiratory system: continues to have diffuse rhonchi, no wheezes, vent on 50% FiO2 Cardiovascular system: tachycardic,, HR 110's on monitor, regular rhythm, 2+ DP pulses b/l Neurologic: alert, follows commands, flaccid extremities Gastrointestinal system: little more distended vs yesterday, non-tender, G tube in place with continuous tube feeds. Extremities: no pedal edema, SCD's and offloading boots in place, b/l upper extremities with improving edema, b/l UE rash nearly resolved    Labs   Data Reviewed: I have personally reviewed following labs and imaging studies  CBC: Recent Labs  Lab 04/04/21 0603 04/04/21 1114 04/05/21 0702 04/06/21 0101 04/07/21 0125 04/08/21 0457  WBC 10.2  --  10.4 12.6* 15.0* 14.4*  NEUTROABS  --   --   --  9.6*  --   --   HGB 14.0 12.2* 12.8* 13.5  12.5* 12.6*  HCT 42.6 36.0* 38.8* 40.9 40.3 40.4  MCV 79.3*  --  78.9* 80.5 82.4 83.1  PLT 249  --  238 233 230 725   Basic Metabolic Panel: Recent Labs  Lab 04/04/21 0603 04/04/21 1114 04/04/21 1635 04/05/21 0702 04/06/21 0101 04/06/21 1204 04/06/21 1605 04/07/21 0125 04/08/21 0457  NA 147*   < >  --  148* 148* 141  --  148* 147*  K 2.9*   < >  --  3.5 3.5  --  3.6 4.2 3.3*  CL 116*  --    --  109 107  --   --  107 99  CO2 24  --   --  32 37*  --   --  35* 36*  GLUCOSE 323*  --   --  330* 343*  --   --  299* 229*  BUN 6*  --   --  6* 10  --   --  14 19  CREATININE 0.31*  --   --  <0.30* <0.30*  --   --  <0.30* <0.30*  CALCIUM 9.4  --   --  9.0 8.8*  --   --  8.5* 8.7*  MG 2.2  --  2.0  --  2.1  --   --  2.2 2.1  PHOS 1.1*  --  1.9*  --  <1.0* 1.9*  --  1.7* 2.6   < > = values in this interval not displayed.   GFR: CrCl cannot be calculated (This lab value cannot be used to calculate CrCl because it is not a number: <0.30). Liver Function Tests: Recent Labs  Lab 04/01/21 1831  AST 9*  ALT 11  ALKPHOS 79  BILITOT 2.2*  PROT 7.6  ALBUMIN 2.5*   No results for input(s): LIPASE, AMYLASE in the last 168 hours. No results for input(s): AMMONIA in the last 168 hours.  Coagulation Profile: No results for input(s): INR, PROTIME in the last 168 hours.  Cardiac Enzymes: No results for input(s): CKTOTAL, CKMB, CKMBINDEX, TROPONINI in the last 168 hours. BNP (last 3 results) No results for input(s): PROBNP in the last 8760 hours. HbA1C: No results for input(s): HGBA1C in the last 72 hours. CBG: Recent Labs  Lab 04/07/21 2341 04/08/21 0330 04/08/21 0758 04/08/21 1116 04/08/21 1539  GLUCAP 255* 212* 185* 169* 165*   Lipid Profile: No results for input(s): CHOL, HDL, LDLCALC, TRIG, CHOLHDL, LDLDIRECT in the last 72 hours.  Thyroid Function Tests: No results for input(s): TSH, T4TOTAL, FREET4, T3FREE, THYROIDAB in the last 72 hours. Anemia Panel: No results for input(s): VITAMINB12, FOLATE, FERRITIN, TIBC, IRON, RETICCTPCT in the last 72 hours. Sepsis Labs: Recent Labs  Lab 04/01/21 1831 04/06/21 0808  PROCALCITON  --  0.73  LATICACIDVEN 0.9  --     Recent Results (from the past 240 hour(s))  Culture, Respiratory w Gram Stain     Status: None   Collection Time: 04/02/21  2:05 PM   Specimen: Tracheal Aspirate; Respiratory  Result Value Ref Range Status    Specimen Description TRACHEAL ASPIRATE  Final   Special Requests NONE  Final   Gram Stain   Final    ABUNDANT WBC PRESENT,BOTH PMN AND MONONUCLEAR FEW GRAM NEGATIVE RODS FEW GRAM VARIABLE ROD RARE YEAST Performed at [facility], [place] [address], [place], [place] [address]    Culture MODERATE PSEUDOMONAS AERUGINOSA  Final   Report Status 04/06/2021 FINAL  Final   Organism ID, Bacteria  PSEUDOMONAS AERUGINOSA  Final      Susceptibility   Pseudomonas aeruginosa - MIC*    CEFTAZIDIME 4 SENSITIVE Sensitive     CIPROFLOXACIN 1 SENSITIVE Sensitive     GENTAMICIN <=1 SENSITIVE Sensitive     IMIPENEM <=0.25 SENSITIVE Sensitive     PIP/TAZO <=4 SENSITIVE Sensitive     CEFEPIME 2 SENSITIVE Sensitive     * MODERATE PSEUDOMONAS AERUGINOSA      Imaging Studies   No results found.   Medications   Scheduled Meds:  aspirin  81 mg Per Tube Daily   chlorhexidine gluconate (MEDLINE KIT)  15 mL Mouth Rinse BID   Chlorhexidine Gluconate Cloth  6 each Topical Q2000   enoxaparin (LOVENOX) injection  40 mg Subcutaneous Q24H   feeding supplement (PROSource TF)  45 mL Per Tube BID   folic acid  1 mg Intravenous Daily   free water  250 mL Per Tube Q4H   furosemide  40 mg Intravenous BID   hydrocortisone cream   Topical TID   insulin aspart  0-15 Units Subcutaneous Q4H   insulin aspart  16 Units Subcutaneous Q4H   insulin glargine  25 Units Subcutaneous BID   ipratropium-albuterol  3 mL Nebulization TID   latanoprost  1 drop Both Eyes QHS   LORazepam  0.5 mg Per Tube QHS   mouth rinse  15 mL Mouth Rinse 10 times per day   metoCLOPramide (REGLAN) injection  10 mg Intravenous Q8H   metoprolol tartrate  25 mg Per Tube BID   PARoxetine  20 mg Per Tube q morning   polyvinyl alcohol  1 drop Both Eyes TID   senna-docusate  1 tablet Per Tube QHS   Continuous Infusions:  famotidine (PEPCID) IV 20 mg (04/08/21 0829)   feeding supplement (OSMOLITE 1.5 CAL) 1,000 mL (04/08/21 0828)    meropenem (MERREM) IV 1 g (04/08/21 1317)       LOS: 10 days    Time spent: 30 minutes with greater than 50% spent at bedside and in coordination of care.    Ezekiel Slocumb, DO [facility]  04/08/2021, 4:06 PM      If 7PM-7AM, please contact night-coverage. How to contact the [facility] Attending or Consulting provider [place] or covering provider during after hours [place], for this patient?    Check the care team in [facility] and look for a) attending/consulting TRH provider listed and b) the [facility] team listed Log into [email or web address] and use [place]'s universal password to access. If you do not have the password, please contact the hospital operator. Locate the [facility] provider you are looking for under [facility] and page to a number that you can be directly reached. If you still have difficulty reaching the provider, please page the [facility] (Director on Call) for the Hospitalists listed on amion for assistance.

## 2021-04-08 NOTE — Plan of Care (Signed)
  Problem: Activity: Goal: Ability to tolerate increased activity will improve Outcome: Progressing   Problem: Respiratory: Goal: Ability to maintain a clear airway and adequate ventilation will improve Outcome: Progressing   Problem: Role Relationship: Goal: Method of communication will improve Outcome: Progressing   Problem: Activity: Goal: Ability to tolerate increased activity will improve Outcome: Progressing   Problem: Clinical Measurements: Goal: Ability to maintain a body temperature in the normal range will improve Outcome: Progressing   Problem: Respiratory: Goal: Ability to maintain adequate ventilation will improve Outcome: Progressing Goal: Ability to maintain a clear airway will improve Outcome: Progressing   Problem: Education: Goal: Knowledge of General Education information will improve Description: Including pain rating scale, medication(s)/side effects and non-pharmacologic comfort measures Outcome: Progressing   Problem: Health Behavior/Discharge Planning: Goal: Ability to manage health-related needs will improve Outcome: Progressing   Problem: Clinical Measurements: Goal: Ability to maintain clinical measurements within normal limits will improve Outcome: Progressing Goal: Will remain free from infection Outcome: Progressing Goal: Diagnostic test results will improve Outcome: Progressing Goal: Respiratory complications will improve Outcome: Progressing Goal: Cardiovascular complication will be avoided Outcome: Progressing   Problem: Activity: Goal: Risk for activity intolerance will decrease Outcome: Progressing   Problem: Nutrition: Goal: Adequate nutrition will be maintained Outcome: Progressing   Problem: Coping: Goal: Level of anxiety will decrease Outcome: Progressing   Problem: Elimination: Goal: Will not experience complications related to bowel motility Outcome: Progressing Goal: Will not experience complications related to  urinary retention Outcome: Progressing   Problem: Pain Managment: Goal: General experience of comfort will improve Outcome: Progressing   Problem: Safety: Goal: Ability to remain free from injury will improve Outcome: Progressing   Problem: Skin Integrity: Goal: Risk for impaired skin integrity will decrease Outcome: Progressing   

## 2021-04-08 NOTE — Progress Notes (Signed)
Nutrition Follow-up  DOCUMENTATION CODES:   Not applicable  INTERVENTION:   Continue Tube Feeding via PEG: Osmolite 1.5 at 60 ml/hr Pro-Stat 45 mL daily Provides 101 g of protein, 2200 kcals and 1094 mL of free water  Total free water with TF and current free water flushes of 250 mL q 4 hours: 2594 L of free water    NUTRITION DIAGNOSIS:   Inadequate oral intake related to dysphagia, chronic illness as evidenced by NPO status.  Being addressed via TF   GOAL:   Patient will meet greater than or equal to 90% of their needs  Progressing  MONITOR:   TF tolerance, Labs, Weight trends, Skin  REASON FOR ASSESSMENT:   Ventilator    ASSESSMENT:   65 yo male with ALS and chronic respiratory failure s/p trach on home vent and admitted with acute on chronic respiratory failure with suspected aspiration pneumonia, severe sepsis  6/15 IR placed G-tube, Bronch  Pt remains on vent support via trach, mentation improving. Palliative care following  Tolerating Osmolite 1.5 at 60 ml/hr via PEG tube per RN  Receiving free water 250 mL q 4 hours  Electrolytes abnormalities improving; phosphorus now wdl  Labs: sodium 147 (H), potassium 3.3 (L) Meds: lasix, ss novolog, novolog q 4 hours, lantus, reglan, senna-docusate    Diet Order:   Diet Order             Diet NPO time specified Except for: Sips with Meds  Diet effective now                   EDUCATION NEEDS:   Not appropriate for education at this time  Skin:  Skin Assessment: Skin Integrity Issues: Skin Integrity Issues:: Stage I Stage I: sacrum  Last BM:  6/21  Height:   Ht Readings from Last 1 Encounters:  03/23/2021 5\' 9"  (1.753 m)    Weight:   Wt Readings from Last 1 Encounters:  04/08/21 82.1 kg     BMI:  Body mass index is 26.73 kg/m.  Estimated Nutritional Needs:   Kcal:  2000-2200 kcals  Protein:  100-115 g  Fluid:  >/= 2 L  04/10/21 MS, RDN, LDN, CNSC Registered Dietitian  III Clinical Nutrition RD Pager and On-Call Pager Number Located in [place]

## 2021-04-09 DIAGNOSIS — J9621 Acute and chronic respiratory failure with hypoxia: Secondary | ICD-10-CM | POA: Diagnosis not present

## 2021-04-09 DIAGNOSIS — E8809 Other disorders of plasma-protein metabolism, not elsewhere classified: Secondary | ICD-10-CM | POA: Diagnosis not present

## 2021-04-09 DIAGNOSIS — J189 Pneumonia, unspecified organism: Secondary | ICD-10-CM | POA: Diagnosis not present

## 2021-04-09 DIAGNOSIS — G1221 Amyotrophic lateral sclerosis: Secondary | ICD-10-CM | POA: Diagnosis not present

## 2021-04-09 LAB — GLUCOSE, CAPILLARY
Glucose-Capillary: 141 mg/dL — ABNORMAL HIGH (ref 70–99)
Glucose-Capillary: 178 mg/dL — ABNORMAL HIGH (ref 70–99)
Glucose-Capillary: 183 mg/dL — ABNORMAL HIGH (ref 70–99)
Glucose-Capillary: 201 mg/dL — ABNORMAL HIGH (ref 70–99)
Glucose-Capillary: 204 mg/dL — ABNORMAL HIGH (ref 70–99)
Glucose-Capillary: 223 mg/dL — ABNORMAL HIGH (ref 70–99)
Glucose-Capillary: 244 mg/dL — ABNORMAL HIGH (ref 70–99)

## 2021-04-09 LAB — MAGNESIUM: Magnesium: 2.3 mg/dL (ref 1.7–2.4)

## 2021-04-09 LAB — PHOSPHORUS: Phosphorus: 2.2 mg/dL — ABNORMAL LOW (ref 2.5–4.6)

## 2021-04-09 LAB — BASIC METABOLIC PANEL
Anion gap: 6 (ref 5–15)
BUN: 17 mg/dL (ref 8–23)
CO2: 37 mmol/L — ABNORMAL HIGH (ref 22–32)
Calcium: 8.6 mg/dL — ABNORMAL LOW (ref 8.9–10.3)
Chloride: 98 mmol/L (ref 98–111)
Creatinine, Ser: <0.3 mg/dL — ABNORMAL LOW (ref 0.61–1.24)
Glucose, Bld: 204 mg/dL — ABNORMAL HIGH (ref 70–99)
Potassium: 3.8 mmol/L (ref 3.5–5.1)
Sodium: 141 mmol/L (ref 135–145)

## 2021-04-09 LAB — CBC WITH DIFFERENTIAL/PLATELET
Abs Immature Granulocytes: 0.13 10*3/uL — ABNORMAL HIGH (ref 0.00–0.07)
Basophils Absolute: 0.1 10*3/uL (ref 0.0–0.1)
Basophils Relative: 0 %
Eosinophils Absolute: 0.2 10*3/uL (ref 0.0–0.5)
Eosinophils Relative: 1 %
HCT: 40.3 % (ref 39.0–52.0)
Hemoglobin: 12.5 g/dL — ABNORMAL LOW (ref 13.0–17.0)
Immature Granulocytes: 1 %
Lymphocytes Relative: 9 %
Lymphs Abs: 1.6 10*3/uL (ref 0.7–4.0)
MCH: 25.9 pg — ABNORMAL LOW (ref 26.0–34.0)
MCHC: 31 g/dL (ref 30.0–36.0)
MCV: 83.4 fL (ref 80.0–100.0)
Monocytes Absolute: 1.2 10*3/uL — ABNORMAL HIGH (ref 0.1–1.0)
Monocytes Relative: 7 %
Neutro Abs: 15 10*3/uL — ABNORMAL HIGH (ref 1.7–7.7)
Neutrophils Relative %: 82 %
Platelets: 183 10*3/uL (ref 150–400)
RBC: 4.83 MIL/uL (ref 4.22–5.81)
RDW: 17.8 % — ABNORMAL HIGH (ref 11.5–15.5)
WBC: 18.2 10*3/uL — ABNORMAL HIGH (ref 4.0–10.5)
nRBC: 0 % (ref 0.0–0.2)

## 2021-04-09 MED ORDER — METOPROLOL TARTRATE 25 MG/10 ML ORAL SUSPENSION
25.0000 mg | Freq: Two times a day (BID) | ORAL | Status: DC
  Administered 2021-04-09: 25 mg via ORAL
  Filled 2021-04-09: qty 10

## 2021-04-09 NOTE — Progress Notes (Signed)
Daily Progress Note   Patient Name: Wayne Young       Date: 04/09/2021 DOB: [birth year]  Age: 65 y.o. MRN#: [id number] Attending Physician: Jonetta Osgood, MD Primary Care Physician: Cipriano Mile, NP Admit Date: 04/05/2021  Reason for Consultation/Follow-up: Establishing goals of care  Subjective: Wayne Young is awake today. Greets me with a silent, "how are you?". He does not answer other questions.  Wayne Young and Wayne Young's daughter are at bedside.  Merropenam course scheduled to be complete tomorrow.  They continue to desire full scope care for now.   ROS  Length of Stay: 11  Current Medications: Scheduled Meds:  . aspirin  81 mg Per Tube Daily  . chlorhexidine gluconate (MEDLINE KIT)  15 mL Mouth Rinse BID  . Chlorhexidine Gluconate Cloth  6 each Topical Q2000  . enoxaparin (LOVENOX) injection  40 mg Subcutaneous Q24H  . feeding supplement (PROSource TF)  45 mL Per Tube BID  . folic acid  1 mg Intravenous Daily  . free water  250 mL Per Tube Q4H  . furosemide  40 mg Intravenous BID  . insulin aspart  0-15 Units Subcutaneous Q4H  . insulin aspart  16 Units Subcutaneous Q4H  . insulin glargine  25 Units Subcutaneous BID  . ipratropium-albuterol  3 mL Nebulization TID  . latanoprost  1 drop Both Eyes QHS  . LORazepam  0.5 mg Per Tube QHS  . mouth rinse  15 mL Mouth Rinse 10 times per day  . metoCLOPramide (REGLAN) injection  10 mg Intravenous Q8H  . metoprolol tartrate  25 mg Oral BID  . PARoxetine  20 mg Per Tube q morning  . polyvinyl alcohol  1 drop Both Eyes TID  . senna-docusate  1 tablet Per Tube QHS    Continuous Infusions: . famotidine (PEPCID) IV 20 mg (04/09/21 0825)  . feeding supplement (OSMOLITE 1.5 CAL) 1,000 mL (04/09/21 0312)  . meropenem (MERREM)  IV 1 g (04/09/21 0512)    PRN Meds: acetaminophen, labetalol, metoprolol tartrate, mineral oil-hydrophilic petrolatum, polyvinyl alcohol  Physical Exam Vitals and nursing note reviewed.  Constitutional:      Appearance: He is ill-appearing.  Skin:    General: Skin is warm and dry.  Neurological:     Comments: UTA orientation  Vital Signs: BP (!) 155/80   Pulse (!) 119   Temp 99.1 F (37.3 C) (Oral)   Resp 16   Ht 5' 9"  (1.753 m)   Wt 82.5 kg   SpO2 94%   BMI 26.86 kg/m  SpO2: SpO2: 94 % O2 Device: O2 Device: Ventilator O2 Flow Rate: O2 Flow Rate (L/min): 0 L/min  Intake/output summary:  Intake/Output Summary (Last 24 hours) at 04/09/2021 1357 Last data filed at 04/09/2021 1000 Gross per 24 hour  Intake 1760 ml  Output 1750 ml  Net 10 ml   LBM: Last BM Date: 04/08/21 Baseline Weight: Weight: 68 kg Most recent weight: Weight: 82.5 kg       Palliative Assessment/Data: PPS: 10%      Patient Active Problem List   Diagnosis Date Noted  . Dysphagia   . Cerebral thrombosis with cerebral infarction 04/02/2021  . CAP (community acquired pneumonia) 03/20/2021  . Hypokalemia 04/12/2021  . Tracheostomy dependent ([place]) 03/23/2021  . Hypoalbuminemia 03/24/2021  . History of TB (tuberculosis) 04/12/2021  . Acute on chronic respiratory failure with hypoxia ([place]) 03/27/2021  . ALS (amyotrophic lateral sclerosis) ([place]) 02/07/2021  . Acute on chronic respiratory failure with hypoxemia ([place]) 08/09/2019  . Hyperlipidemia 08/09/2019  . Hypertension 08/09/2019  . Type 2 diabetes mellitus, with long-term current use of insulin ([place]) 08/09/2019  . Consolidation lung ([place])   . Acute respiratory distress   . Encounter for imaging study to confirm orogastric (OG) tube placement   . Self extubation   . Aspiration into airway   . Pressure injury of skin 08/04/2018  . Atelectasis of left lung   . Protein-calorie malnutrition, severe 07/28/2018  . Recent unintentional  weight loss over several months   . Acute respiratory failure ([place]) 07/25/2018  . 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 Rheumatoid arthritis with positive rheumatoid factor ([place]) 07/13/2018  . Incidental pulmonary nodule, > 6m and < 863m09/25/2019  . 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 Weight loss 07/13/2018  . Chronic midline low back pain without sciatica 12/31/2017  . High risk medication use 06/16/2017  . History of rheumatoid arthritis 06/16/2017    Palliative Care Assessment & Plan   Patient Profile: [age].o. male  with past medical history of ALS, wheelchair and vent dependent, HTN, DM2, RA, glaucoma, blind in R eye admitted on 03/23/2021 with pneumonia- likely aspiration due to worsening dysphagia from ALS. Palliative consulted to assist with discussion re: PEG tube vs hospice. PEG was placed however, patient continues to be ill from pnuemonia. He likely is continuing to aspirate.   Assessment/Recommendations/Plan  Continues with minimal improvement in mental status Meropenem scheduled to be complete tomorrow- PMT will follow for status  Goals of Care and Additional Recommendations: Limitations on Scope of Treatment: Full Scope Treatment  Code Status: DNR  Prognosis:  Unable to determine  Discharge Planning: To Be Determined  Care plan was discussed with patient's family.  Thank you for allowing the Palliative Medicine Team to assist in the care of this patient.   Total time: 18 minutes Greater than 50%  of this time was spent counseling and coordinating care related to the above assessment and plan.  KaMariana KaufmanAGNP-C Palliative Medicine   Please contact Palliative Medicine Team phone at [phone] questions and concerns.

## 2021-04-09 NOTE — Progress Notes (Signed)
PROGRESS NOTE        PATIENT DETAILS Name: Wayne Young Age: 65 y.o. Sex: male Date of Birth: 04/24/1956 Admit Date: 04/06/2021 Admitting Physician Rise Patience, MD YTK:ZSWFU, Rachel Moulds, NP  Brief Narrative: Patient is a 65 y.o. 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  Significant events: 6/11 >>admit to [facility] with sepsis, pneumonia/ UTI.  6/14>> code stroke called- brain MRI-punctate area of ischemia in superior left parietal lobe, not candidate for tPA, pt at baseline mental status 6/14>> palliative care following, code status discussions. Made DNR 6/15>>PEG placed. 6/15>> FOB for complete opacification of left hemithorax. Sputum culture repeated. 6/19>>Increased secretions / high peak pressures, hypoxemia + fever. MSSA+ Pseudomonas in trach culture. Rash on RUE.  Family met with palliative care > family desires in hospital death.  Antimicrobial therapy: Vancomycin: 6/10 x 1, 6/13 x 1 Cefepime: 6/10>> 6/13 Unasyn: 6/14>> 6/17 Meropenem: 6/18>>  Microbiology data: 6/11>> blood culture: Staphylococcus capitis 6/11>> sputum culture: MSSA, Proteus is all 6/15>> sputum culture: Pansensitive Pseudomonas  Procedures : 6/15>> bronchoscopy 6/15>> PEG tube placement  Consults: Pulmonology, IR, neurology, palliative care  DVT Prophylaxis : enoxaparin (LOVENOX) injection 40 mg Start: 04/03/21 1000   Subjective: Per nursing staff-continues to have extensive issues with secretion-low-grade fever this morning.  Awake-very lethargic-barely following commands.   Assessment/Plan: Acute on chronic hypoxic respiratory failure due to aspiration pneumonia/HCAP: Remains vent dependent-culture data as above-plans are to continue with meropenem for a few days to see if patient recovers.  Overall prognosis is poor-palliative care engaging with  family-per last notes-family wants reassess after a course of meropenem.  Severe sepsis due to aspiration pneumonia/HCAP: Sepsis physiology slowly improving-still with intermittent low-grade fever.  Acute CVA: Evaluated by neurology-felt to be an incidental finding-on ASA.  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 and PEG tube in place: Poor overall prognosis-await palliative care follow-up.    Anasarca: Likely due to hypoalbuminemia-on Lasix  HTN: BP reasonable-continue Lopressor  Sinus tachycardia: Likely physiological-supportive care for now-on beta-blocker  HLD: Resume Lipitor over the next few days  DM-2: CBGs relatively stable-allow permissive hyperglycemia given overall clinical state-continue Lantus 25 units twice daily, NovoLog 16 units every 4 with sliding scale.  Recent Labs    04/08/21 2356 04/09/21 0354 04/09/21 0811  GLUCAP 223* 244* 204*     Rheumatoid arthritis: On methotrexate-on hold due to sepsis/pneumonia.  Depression: Continue Paxil  Insomnia: On Ativan  Severe protein calorie malnutrition: Dietitian following-on tube feeds  Pressure ulcer Pressure Injury 08/03/18 Stage II -  Partial thickness loss of dermis presenting as a shallow open ulcer with a red, pink wound bed without slough. This is patchy areas of full thickness skin loss related to moisture associated skin damage, NOT a pressur (Active)  08/03/18 0800  Location: Sacrum  Location Orientation: Medial  Staging: Stage II -  Partial thickness loss of dermis presenting as a shallow open ulcer with a red, pink wound bed without slough.  Wound Description (Comments): This is patchy areas of full thickness skin loss related to moisture associated skin damage, NOT a pressure injury  Present on  Admission: No     Pressure Injury  04/04/2021 Sacrum Medial Stage 1 -  Intact skin with non-blanchable redness of a localized area usually over a bony prominence. non-blanchable (Active)  04/14/2021 1600  Location: Sacrum  Location Orientation: Medial  Staging: Stage 1 -  Intact skin with non-blanchable redness of a localized area usually over a bony prominence.  Wound Description (Comments): non-blanchable  Present on Admission: Yes    Diet: Diet Order             Diet NPO time specified Except for: Sips with Meds  Diet effective now                    Code Status: DNR  Family Communication: None at bedside-palliative care engaging family  Disposition Plan: Status is: Inpatient  Remains inpatient appropriate because:Inpatient level of care appropriate due to severity of illness  Dispo: The patient is from: Home              Anticipated d/c is to:  TBD              Patient currently is not medically stable to d/c.   Difficult to place patient No   Barriers to Discharge: Severe lethargy (at home able to interact with family members)-on vent-remains on IV meropenem for multifocal pneumonia.  Goals of care in progress by the palliative care team.  Antimicrobial agents: Anti-infectives (From admission, onward)    Start     Dose/Rate Route Frequency Ordered Stop   04/05/21 0930  meropenem (MERREM) 1 g in sodium chloride 0.9 % 100 mL IVPB        1 g 200 mL/hr over 30 Minutes Intravenous Every 8 hours 04/05/21 0843 04/10/21 0559   04/02/21 1540  ceFAZolin (ANCEF) IVPB 2g/100 mL premix        over 30 Minutes Intravenous Continuous PRN 04/02/21 1548 04/02/21 1540   04/01/21 1230  Ampicillin-Sulbactam (UNASYN) 3 g in sodium chloride 0.9 % 100 mL IVPB  Status:  Discontinued        3 g 200 mL/hr over 30 Minutes Intravenous Every 8 hours 04/01/21 1140 04/05/21 0824   03/31/21 2300  ceFEPIme (MAXIPIME) 2 g in sodium chloride 0.9 % 100 mL IVPB  Status:  Discontinued        2  g 200 mL/hr over 30 Minutes Intravenous Every 8 hours 03/31/21 1542 04/01/21 1044   03/31/21 2200  vancomycin (VANCOREADY) IVPB 750 mg/150 mL  Status:  Discontinued        750 mg 150 mL/hr over 60 Minutes Intravenous Every 12 hours 03/31/21 1143 04/01/21 1044   03/31/21 1045  vancomycin (VANCOREADY) IVPB 1250 mg/250 mL        1,250 mg 166.7 mL/hr over 90 Minutes Intravenous  Once 03/31/21 0957 03/31/21 1302   04/05/2021 1700  vancomycin (VANCOREADY) IVPB 1000 mg/200 mL  Status:  Discontinued        1,000 mg 200 mL/hr over 60 Minutes Intravenous Every 12 hours 04/01/2021 0943 04/06/2021 1115   04/10/2021 1300  ceFEPIme (MAXIPIME) 2 g in sodium chloride 0.9 % 100 mL IVPB  Status:  Discontinued        2 g 200 mL/hr over 30 Minutes Intravenous Every 8 hours 04/12/2021 0833 03/31/21 1542   04/07/2021 1300  vancomycin (VANCOREADY) IVPB 1500 mg/300 mL  Status:  Discontinued        1,500 mg 150 mL/hr over 120 Minutes Intravenous Every 8 hours 03/23/2021 0941 03/22/2021 0943   04/13/2021 0530  vancomycin (VANCOREADY) IVPB  1000 mg/200 mL  Status:  Discontinued        1,000 mg 200 mL/hr over 60 Minutes Intravenous  Once 03/30/2021 0517 03/25/2021 0518   03/27/2021 0530  ceFEPIme (MAXIPIME) 2 g in sodium chloride 0.9 % 100 mL IVPB        2 g 200 mL/hr over 30 Minutes Intravenous  Once 03/25/2021 0517 04/06/2021 0549   03/28/2021 0530  vancomycin (VANCOREADY) IVPB 1250 mg/250 mL        1,250 mg 166.7 mL/hr over 90 Minutes Intravenous  Once 04/10/2021 0518 03/25/2021 0728        Time spent: 35 minutes-Greater than 50% of this time was spent in counseling, explanation of diagnosis, planning of further management, and coordination of care.  MEDICATIONS: Scheduled Meds:  aspirin  81 mg Per Tube Daily   chlorhexidine gluconate (MEDLINE KIT)  15 mL Mouth Rinse BID   Chlorhexidine Gluconate Cloth  6 each Topical Q2000   enoxaparin (LOVENOX) injection  40 mg Subcutaneous Q24H   feeding supplement (PROSource TF)  45 mL Per Tube BID    folic acid  1 mg Intravenous Daily   free water  250 mL Per Tube Q4H   furosemide  40 mg Intravenous BID   hydrocortisone cream   Topical TID   insulin aspart  0-15 Units Subcutaneous Q4H   insulin aspart  16 Units Subcutaneous Q4H   insulin glargine  25 Units Subcutaneous BID   ipratropium-albuterol  3 mL Nebulization TID   latanoprost  1 drop Both Eyes QHS   LORazepam  0.5 mg Per Tube QHS   mouth rinse  15 mL Mouth Rinse 10 times per day   metoCLOPramide (REGLAN) injection  10 mg Intravenous Q8H   metoprolol tartrate  25 mg Oral BID   PARoxetine  20 mg Per Tube q morning   polyvinyl alcohol  1 drop Both Eyes TID   senna-docusate  1 tablet Per Tube QHS   Continuous Infusions:  famotidine (PEPCID) IV 20 mg (04/09/21 0825)   feeding supplement (OSMOLITE 1.5 CAL) 1,000 mL (04/09/21 0312)   meropenem (MERREM) IV 1 g (04/09/21 0512)   PRN Meds:.acetaminophen, labetalol, metoprolol tartrate, mineral oil-hydrophilic petrolatum, polyvinyl alcohol   PHYSICAL EXAM: Vital signs: Vitals:   04/09/21 0815 04/09/21 0819 04/09/21 0900 04/09/21 0930  BP:   (!) 166/91 140/82  Pulse:   (!) 125 (!) 116  Resp:   16 16  Temp: 100.1 F (37.8 C)     TempSrc: Oral     SpO2:  93% 92% 95%  Weight:      Height:       Filed Weights   04/07/21 0139 04/08/21 0400 04/09/21 0500  Weight: 79.8 kg 82.1 kg 82.5 kg   Body mass index is 26.86 kg/m.   Gen Exam:Alert-appears lethargic-blinks to commands. HEENT:atraumatic, normocephalic Chest: B/L clear to auscultation anteriorly CVS:S1S2 regular Abdomen:soft non tender, non distended Extremities:trace edema Neurology: Quadriplegia Skin: no rash  I have personally reviewed following labs and imaging studies  LABORATORY DATA: CBC: Recent Labs  Lab 04/05/21 0702 04/06/21 0101 04/07/21 0125 04/08/21 0457 04/09/21 0753  WBC 10.4 12.6* 15.0* 14.4* 18.2*  NEUTROABS  --  9.6*  --   --  15.0*  HGB 12.8* 13.5 12.5* 12.6* 12.5*  HCT 38.8* 40.9  40.3 40.4 40.3  MCV 78.9* 80.5 82.4 83.1 83.4  PLT 238 233 230 202 353    Basic Metabolic Panel: Recent Labs  Lab 04/04/21 1635 04/05/21 0702 04/06/21 0101  04/06/21 1204 04/06/21 1605 04/07/21 0125 04/08/21 0457 04/09/21 0753  NA  --  148* 148* 141  --  148* 147* 141  K  --  3.5 3.5  --  3.6 4.2 3.3* 3.8  CL  --  109 107  --   --  107 99 98  CO2  --  32 37*  --   --  35* 36* 37*  GLUCOSE  --  330* 343*  --   --  299* 229* 204*  BUN  --  6* 10  --   --  _0 CREATININE  --  <0.30* <0.30*  --   --  <0.30* <0.30* <0.30*  CALCIUM  --  9.0 8.8*  --   --  8.5* 8.7* 8.6*  MG 2.0  --  2.1  --   --  2.2 2.1 2.3  PHOS 1.9*  --  <1.0* 1.9*  --  1.7* 2.6 2.2*    GFR: CrCl cannot be calculated (This lab value cannot be used to calculate CrCl because it is not a number: <0.30).  Liver Function Tests: No results for input(s): AST, ALT, ALKPHOS, BILITOT, PROT, ALBUMIN in the last 168 hours. No results for input(s): LIPASE, AMYLASE in the last 168 hours. No results for input(s): AMMONIA in the last 168 hours.  Coagulation Profile: No results for input(s): INR, PROTIME in the last 168 hours.  Cardiac Enzymes: No results for input(s): CKTOTAL, CKMB, CKMBINDEX, TROPONINI in the last 168 hours.  BNP (last 3 results) No results for input(s): PROBNP in the last 8760 hours.  Lipid Profile: No results for input(s): CHOL, HDL, LDLCALC, TRIG, CHOLHDL, LDLDIRECT in the last 72 hours.  Thyroid Function Tests: No results for input(s): TSH, T4TOTAL, FREET4, T3FREE, THYROIDAB in the last 72 hours.  Anemia Panel: No results for input(s): VITAMINB12, FOLATE, FERRITIN, TIBC, IRON, RETICCTPCT in the last 72 hours.  Urine analysis:    Component Value Date/Time   COLORURINE AMBER (A) 04/01/2021 0335   APPEARANCEUR CLOUDY (A) 03/19/2021 0335   LABSPEC 1.009 04/01/2021 0335   PHURINE 7.0 03/25/2021 0335   GLUCOSEU >=500 (A) 04/15/2021 0335   HGBUR MODERATE (A) 04/04/2021 0335    BILIRUBINUR NEGATIVE 04/03/2021 0335   KETONESUR NEGATIVE 03/19/2021 0335   PROTEINUR 100 (A) 04/01/2021 0335   NITRITE NEGATIVE 03/19/2021 0335   LEUKOCYTESUR LARGE (A) 04/07/2021 0335    Sepsis Labs: Lactic Acid, Venous    Component Value Date/Time   LATICACIDVEN 0.9 04/01/2021 1831    MICROBIOLOGY: Recent Results (from the past 240 hour(s))  Culture, Respiratory w Gram Stain     Status: None   Collection Time: 04/02/21  2:05 PM   Specimen: Tracheal Aspirate; Respiratory  Result Value Ref Range Status   Specimen Description TRACHEAL ASPIRATE  Final   Special Requests NONE  Final   Gram Stain   Final    ABUNDANT WBC PRESENT,BOTH PMN AND MONONUCLEAR FEW GRAM NEGATIVE RODS FEW GRAM VARIABLE ROD RARE YEAST Performed at [facility], [place] [address] [place], [place] [address]    Culture MODERATE PSEUDOMONAS AERUGINOSA  Final   Report Status 04/06/2021 FINAL  Final   Organism ID, Bacteria PSEUDOMONAS AERUGINOSA  Final      Susceptibility   Pseudomonas aeruginosa - MIC*    CEFTAZIDIME 4 SENSITIVE Sensitive     CIPROFLOXACIN 1 SENSITIVE Sensitive     GENTAMICIN <=1 SENSITIVE Sensitive     IMIPENEM <=0.25 SENSITIVE Sensitive     PIP/TAZO <=  4 SENSITIVE Sensitive     CEFEPIME 2 SENSITIVE Sensitive     * MODERATE PSEUDOMONAS AERUGINOSA    RADIOLOGY STUDIES/RESULTS: No results found.   LOS: 11 days   Oren Binet, MD  [facility]    To contact the attending provider between 7A-7P or the covering provider during after hours 7P-7A, please log into the web site [email or web address] and access using universal [place] password for that web site. If you do not have the password, please call the hospital operator.  04/09/2021, 9:56 AM

## 2021-04-10 ENCOUNTER — Inpatient Hospital Stay (HOSPITAL_COMMUNITY): Payer: Medicare HMO

## 2021-04-10 LAB — BASIC METABOLIC PANEL
Anion gap: 5 (ref 5–15)
BUN: 18 mg/dL (ref 8–23)
CO2: 36 mmol/L — ABNORMAL HIGH (ref 22–32)
Calcium: 7.7 mg/dL — ABNORMAL LOW (ref 8.9–10.3)
Chloride: 102 mmol/L (ref 98–111)
Creatinine, Ser: <0.3 mg/dL — ABNORMAL LOW (ref 0.61–1.24)
Glucose, Bld: 190 mg/dL — ABNORMAL HIGH (ref 70–99)
Potassium: 3.1 mmol/L — ABNORMAL LOW (ref 3.5–5.1)
Sodium: 143 mmol/L (ref 135–145)

## 2021-04-10 LAB — CBC
HCT: 38.7 % — ABNORMAL LOW (ref 39.0–52.0)
Hemoglobin: 11.8 g/dL — ABNORMAL LOW (ref 13.0–17.0)
MCH: 25.8 pg — ABNORMAL LOW (ref 26.0–34.0)
MCHC: 30.5 g/dL (ref 30.0–36.0)
MCV: 84.7 fL (ref 80.0–100.0)
Platelets: 199 10*3/uL (ref 150–400)
RBC: 4.57 MIL/uL (ref 4.22–5.81)
RDW: 17.7 % — ABNORMAL HIGH (ref 11.5–15.5)
WBC: 18.6 10*3/uL — ABNORMAL HIGH (ref 4.0–10.5)
nRBC: 0 % (ref 0.0–0.2)

## 2021-04-10 LAB — GLUCOSE, CAPILLARY
Glucose-Capillary: 157 mg/dL — ABNORMAL HIGH (ref 70–99)
Glucose-Capillary: 131 mg/dL — ABNORMAL HIGH (ref 70–99)
Glucose-Capillary: 159 mg/dL — ABNORMAL HIGH (ref 70–99)

## 2021-04-10 LAB — PROCALCITONIN: Procalcitonin: 0.54 ng/mL

## 2021-04-10 MED ORDER — SODIUM CHLORIDE 0.9 % IV BOLUS
1000.0000 mL | Freq: Once | INTRAVENOUS | Status: AC
  Administered 2021-04-10: 1000 mL via INTRAVENOUS

## 2021-04-10 MED ORDER — MORPHINE 100MG IN NS 100ML (1MG/ML) PREMIX INFUSION
1.0000 mg/h | INTRAVENOUS | Status: DC
  Administered 2021-04-10: 0.25 mg/h via INTRAVENOUS
  Filled 2021-04-10: qty 100

## 2021-04-10 MED ORDER — ONDANSETRON HCL 4 MG/2ML IJ SOLN: 4.0000 mg | Freq: Four times a day (QID) | INTRAMUSCULAR | Status: DC | PRN

## 2021-04-10 MED ORDER — ACETAMINOPHEN 325 MG PO TABS: 650.0000 mg | ORAL_TABLET | Freq: Four times a day (QID) | ORAL | Status: DC | PRN

## 2021-04-10 MED ORDER — HALOPERIDOL LACTATE 2 MG/ML PO CONC
0.5000 mg | ORAL | Status: DC | PRN
  Filled 2021-04-10: qty 0.3

## 2021-04-10 MED ORDER — GLYCOPYRROLATE 1 MG PO TABS
1.0000 mg | ORAL_TABLET | ORAL | Status: DC | PRN
  Filled 2021-04-10: qty 1

## 2021-04-10 MED ORDER — LACTATED RINGERS IV BOLUS
500.0000 mL | Freq: Once | INTRAVENOUS | Status: AC
  Administered 2021-04-10: 500 mL via INTRAVENOUS

## 2021-04-10 MED ORDER — LORAZEPAM 1 MG PO TABS: 1.0000 mg | ORAL_TABLET | ORAL | Status: DC | PRN

## 2021-04-10 MED ORDER — POTASSIUM CHLORIDE 20 MEQ PO PACK
20.0000 meq | PACK | ORAL | Status: DC
  Administered 2021-04-10: 20 meq
  Filled 2021-04-10 (×2): qty 1

## 2021-04-10 MED ORDER — SODIUM CHLORIDE 0.9% FLUSH: 3.0000 mL | INTRAVENOUS | Status: DC | PRN

## 2021-04-10 MED ORDER — IPRATROPIUM-ALBUTEROL 0.5-2.5 (3) MG/3ML IN SOLN: 3.0000 mL | RESPIRATORY_TRACT | Status: DC | PRN

## 2021-04-10 MED ORDER — MORPHINE BOLUS VIA INFUSION
2.0000 mg | INTRAVENOUS | Status: DC | PRN
  Administered 2021-04-10: 2 mg via INTRAVENOUS
  Filled 2021-04-10: qty 2

## 2021-04-10 MED ORDER — HALOPERIDOL LACTATE 5 MG/ML IJ SOLN: 0.5000 mg | INTRAMUSCULAR | Status: DC | PRN

## 2021-04-10 MED ORDER — LORAZEPAM 0.5 MG PO TABS
0.5000 mg | ORAL_TABLET | Freq: Two times a day (BID) | ORAL | Status: DC
  Administered 2021-04-11: 0.5 mg via ORAL
  Filled 2021-04-10: qty 1

## 2021-04-10 MED ORDER — LORAZEPAM 2 MG/ML PO CONC: 1.0000 mg | ORAL | Status: DC | PRN

## 2021-04-10 MED ORDER — SODIUM CHLORIDE 0.9% FLUSH
3.0000 mL | Freq: Two times a day (BID) | INTRAVENOUS | Status: DC
  Administered 2021-04-10 – 2021-04-11 (×2): 3 mL via INTRAVENOUS

## 2021-04-10 MED ORDER — GLYCOPYRROLATE 0.2 MG/ML IJ SOLN
0.2000 mg | INTRAMUSCULAR | Status: DC | PRN
  Administered 2021-04-10 – 2021-04-11 (×3): 0.2 mg via INTRAVENOUS
  Filled 2021-04-10 (×3): qty 1

## 2021-04-10 MED ORDER — GLYCOPYRROLATE 0.2 MG/ML IJ SOLN: 0.2000 mg | INTRAMUSCULAR | Status: DC | PRN

## 2021-04-10 MED ORDER — POLYVINYL ALCOHOL 1.4 % OP SOLN
1.0000 [drp] | Freq: Four times a day (QID) | OPHTHALMIC | Status: DC | PRN
  Filled 2021-04-10: qty 15

## 2021-04-10 MED ORDER — PHENYLEPHRINE HCL-NACL 10-0.9 MG/250ML-% IV SOLN
25.0000 ug/min | INTRAVENOUS | Status: DC
  Administered 2021-04-10: 20 ug/min via INTRAVENOUS

## 2021-04-10 MED ORDER — SODIUM CHLORIDE 0.9 % IV SOLN: 250.0000 mL | INTRAVENOUS | Status: DC

## 2021-04-10 MED ORDER — ACETAMINOPHEN 650 MG RE SUPP: 650.0000 mg | Freq: Four times a day (QID) | RECTAL | Status: DC | PRN

## 2021-04-10 MED ORDER — BIOTENE DRY MOUTH MT LIQD: 15.0000 mL | OROMUCOSAL | Status: DC | PRN

## 2021-04-10 MED ORDER — LORAZEPAM 2 MG/ML IJ SOLN: 1.0000 mg | INTRAMUSCULAR | Status: DC | PRN

## 2021-04-10 MED ORDER — PHENYLEPHRINE HCL-NACL 10-0.9 MG/250ML-% IV SOLN
INTRAVENOUS | Status: AC
  Filled 2021-04-10: qty 250

## 2021-04-10 MED ORDER — SODIUM CHLORIDE 0.9 % IV BOLUS: 1000.0000 mL | Freq: Once | INTRAVENOUS | Status: DC

## 2021-04-10 MED ORDER — POTASSIUM CHLORIDE 10 MEQ/100ML IV SOLN
10.0000 meq | INTRAVENOUS | Status: AC
  Administered 2021-04-10 (×4): 10 meq via INTRAVENOUS
  Filled 2021-04-10 (×4): qty 100

## 2021-04-10 MED ORDER — HALOPERIDOL 0.5 MG PO TABS
0.5000 mg | ORAL_TABLET | ORAL | Status: DC | PRN
  Filled 2021-04-10: qty 1

## 2021-04-10 MED ORDER — SODIUM CHLORIDE 0.9 % IV SOLN: 250.0000 mL | INTRAVENOUS | Status: DC | PRN

## 2021-04-10 MED ORDER — ONDANSETRON 4 MG PO TBDP
4.0000 mg | ORAL_TABLET | Freq: Four times a day (QID) | ORAL | Status: DC | PRN
  Filled 2021-04-10: qty 1

## 2021-04-10 NOTE — Progress Notes (Signed)
[facility] night shift.  The nursing staff reports that the patient's blood pressure still low in the 80s/50s with a MAP in the 50s despite receiving 1000 mL of LR bolus. Another LR 500 mL bolus was ordered.  He did receive 5 mg of metoprolol IVP at 1904 and 25 mg x 2 at 2206.  Dr. Arsenio Loader has added another 1000 mL of NS bolus along with phenylephrine continuous infusion.  Prognosis is poor.  Sanda Klein, MD

## 2021-04-10 NOTE — Progress Notes (Signed)
eLink Physician-Brief Progress Note Patient Name: Wayne Young DOB: 1956-04-01 MRN: [id number]   Date of Service  04/10/2021  HPI/Events of Note  Hypotension - BP = 88/56 with MAP = 65 post bolus.   eICU Interventions  Plan: Bolus with 0.9 NaCl 1 liter IV over 1 hour now.  Phenylephrine IV infusion via PIV. Titrate to MAP >= 65.     Intervention Category Major Interventions: Hypotension - evaluation and management  Yasaman Kolek Eugene 04/10/2021, 2:11 AM

## 2021-04-10 NOTE — Progress Notes (Signed)
eLink Physician-Brief Progress Note Patient Name: Wayne Young DOB: July 09, 1956 MRN: [id number]   Date of Service  04/10/2021  HPI/Events of Note  Called urgently d/t patient reported to have dropped SBP into 70's associated with spike in temp. However, BP has now recovered to 107/55 with MAP = 69. Patient is currently on Meropenum for pan sensitive pseudomonas in sputum from 6/15. Last LVEF = 55-60%.  eICU Interventions  Plan: Bolus with 0.9 NaCl 1 liter IV over 1 hour now. Further non emergent management per hospitalist who are the primary service.     Intervention Category Major Interventions: Hypotension - evaluation and management  Wayne Young Eugene 04/10/2021, 12:20 AM

## 2021-04-10 NOTE — Progress Notes (Signed)
This chaplain responded to PMT consult for spiritual care.  The Pt. wife-[name] and brother-LJ are at the bedside.   The chaplain listened reflectively as Wayne Young shared the events of the day leading up to the Pt. "Yes" I am ready to meet the Lord. The chaplain understands the Pt. and family are very aware of God's love; faithful to God's presence and guidance in the Pt. life.  LJ who is a Education officer, environmental himself, welcomes a F/U spiritual care presence and intercessory prayer.

## 2021-04-10 NOTE — Progress Notes (Signed)
K+ 3.1 °Replaced per protocol  °

## 2021-04-10 NOTE — Progress Notes (Signed)
PROGRESS NOTE        PATIENT DETAILS Name: Wayne Young Age: 65 y.o. Sex: male Date of Birth: [birth year] Admit Date: 03/20/2021 Admitting Physician Rise Patience, MD CHY:IFOYD, Rachel Moulds, NP  Brief Narrative: Patient is a 65 y.o. 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  Significant events: 6/11 >>admit to [facility] with sepsis, pneumonia/ UTI.  6/14>> code stroke called- brain MRI-punctate area of ischemia in superior left parietal lobe, not candidate for tPA, pt at baseline mental status 6/14>> palliative care following, code status discussions. Made DNR 6/15>>PEG placed. 6/15>> FOB for complete opacification of left hemithorax. Sputum culture repeated. 6/19>>Increased secretions / high peak pressures, hypoxemia + fever. MSSA+ Pseudomonas in trach culture. Rash on RUE.  Family met with palliative care > family desires in hospital death. 6/23>> febrile-hypotensive last night-RN reports need for frequent suctioning-desaturates when turning around to clean a bowel movement.  Palliative care met with family-transition to full comfort measures.  Antimicrobial therapy: Vancomycin: 6/10 x 1, 6/13 x 1 Cefepime: 6/10>> 6/13 Unasyn: 6/14>> 6/17 Meropenem: 6/18>> 6/22  Microbiology data: 6/11>> blood culture: Staphylococcus capitis 6/11>> sputum culture: MSSA, Proteus is all 6/15>> sputum culture: Pansensitive Pseudomonas  Procedures : 6/15>> bronchoscopy 6/15>> PEG tube placement  Consults: Pulmonology, IR, neurology, palliative care  DVT Prophylaxis :    Subjective: Awake-follows commands-blinks once for yes-and twice for now.  RN reports need for numerous suctioning overnight.  This morning-when patient was turned over to clean for a bowel movement-he desaturated-FiO2 requirements increased to 100%.  Tube feedings  held.   Assessment/Plan: Acute on chronic hypoxic respiratory failure due to aspiration pneumonia/HCAP: Chronically vent dependent-in spite of being on meropenem for the past several days-remains febrile.  Suspect this is from ongoing aspiration.  Palliative care met with family again today-plans are to transition to comfort measures.  Awaiting arrival of other family members-as family wants to spend some quality time with patient before disconnecting the ventilator.  Severe sepsis due to aspiration pneumonia/HCAP: Sepsis physiology transiently improved-however febrile-transiently hypotensive overnight-suspect that this is due to aspiration superimposed on Pseudomonas pneumonia.  No longer on antibiotics-focus is shifting to comfort measures.  See palliative care note as of today.  Acute CVA: Evaluated by neurology-felt to be an incidental finding-no longer on aspirin.  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 and PEG tube in place: Unfortunately overtly aspirating secretions-some suspicion that he may be aspirating on PEG tube feeds as well.  As noted above-palliative care has met with family today-plans to transition to comfort measures.  Anasarca: Likely due to hypoalbuminemia-no longer on Lasix.  HTN: BP reasonable-no longer on Lopressor.  Sinus tachycardia: Likely physiological-supportive care for now  HLD: No plans to resume Lipitor.  DM-2: Since transitioned to comfort measures-no plans to check CBGs-no longer on insulin.  Recent Labs    04/10/21 0206 04/10/21 0324 04/10/21 0751  GLUCAP 157* 131* 159*      Rheumatoid arthritis: On methotrexate-on hold due to sepsis/pneumonia.  Depression: Continue Paxil  Insomnia: On Ativan  Severe protein calorie malnutrition:  Dietitian following-on tube feeds  Pressure ulcer Pressure Injury 08/03/18 Stage II -  Partial thickness loss of  dermis presenting as a shallow open ulcer with a red, pink wound bed without slough. This is patchy areas of full thickness skin loss related to moisture associated skin damage, NOT a pressur (Active)  08/03/18 0800  Location: Sacrum  Location Orientation: Medial  Staging: Stage II -  Partial thickness loss of dermis presenting as a shallow open ulcer with a red, pink wound bed without slough.  Wound Description (Comments): This is patchy areas of full thickness skin loss related to moisture associated skin damage, NOT a pressure injury  Present on Admission: No     Pressure Injury 04/05/2021 Sacrum Medial Stage 1 -  Intact skin with non-blanchable redness of a localized area usually over a bony prominence. non-blanchable (Active)  04/06/2021 1600  Location: Sacrum  Location Orientation: Medial  Staging: Stage 1 -  Intact skin with non-blanchable redness of a localized area usually over a bony prominence.  Wound Description (Comments): non-blanchable  Present on Admission: Yes    Diet: Diet Order             Diet NPO time specified Except for: Sips with Meds  Diet effective now                    Code Status: DNR  Family Communication: None at bedside-palliative care spent extensive time with family.  Disposition Plan: Status is: Inpatient  Remains inpatient appropriate because:Inpatient level of care appropriate due to severity of illness  Dispo: The patient is from: Home              Anticipated d/c is to:  TBD              Patient currently is not medically stable to d/c.   Difficult to place patient No   Barriers to Discharge: Transition to comfort measures-anticipate in-hospital death once not on the ventilator.  Antimicrobial agents: Anti-infectives (From admission, onward)    Start     Dose/Rate Route Frequency Ordered Stop   04/05/21 0930   meropenem (MERREM) 1 g in sodium chloride 0.9 % 100 mL IVPB        1 g 200 mL/hr over 30 Minutes Intravenous Every 8 hours 04/05/21 0843 04/09/21 2250   04/02/21 1540  ceFAZolin (ANCEF) IVPB 2g/100 mL premix        over 30 Minutes Intravenous Continuous PRN 04/02/21 1548 04/02/21 1540   04/01/21 1230  Ampicillin-Sulbactam (UNASYN) 3 g in sodium chloride 0.9 % 100 mL IVPB  Status:  Discontinued        3 g 200 mL/hr over 30 Minutes Intravenous Every 8 hours 04/01/21 1140 04/05/21 0824   03/31/21 2300  ceFEPIme (MAXIPIME) 2 g in sodium chloride 0.9 % 100 mL IVPB  Status:  Discontinued        2 g 200 mL/hr over 30 Minutes Intravenous Every 8 hours 03/31/21 1542 04/01/21 1044   03/31/21 2200  vancomycin (VANCOREADY) IVPB 750 mg/150 mL  Status:  Discontinued        750 mg 150 mL/hr over 60 Minutes Intravenous Every 12 hours 03/31/21 1143 04/01/21 1044   03/31/21 1045  vancomycin (VANCOREADY) IVPB 1250 mg/250 mL        1,250 mg 166.7 mL/hr over 90 Minutes Intravenous  Once 03/31/21 0957 03/31/21 1302   04/06/2021 1700  vancomycin (VANCOREADY) IVPB 1000 mg/200 mL  Status:  Discontinued        1,000 mg 200 mL/hr over 60 Minutes Intravenous Every 12 hours 04/15/2021 0943 04/03/2021  1115   04/02/2021 1300  ceFEPIme (MAXIPIME) 2 g in sodium chloride 0.9 % 100 mL IVPB  Status:  Discontinued        2 g 200 mL/hr over 30 Minutes Intravenous Every 8 hours 04/06/2021 0833 03/31/21 1542   03/28/2021 1300  vancomycin (VANCOREADY) IVPB 1500 mg/300 mL  Status:  Discontinued        1,500 mg 150 mL/hr over 120 Minutes Intravenous Every 8 hours 04/09/2021 0941 04/13/2021 0943   04/17/2021 0530  vancomycin (VANCOREADY) IVPB 1000 mg/200 mL  Status:  Discontinued        1,000 mg 200 mL/hr over 60 Minutes Intravenous  Once 03/28/2021 0517 03/23/2021 0518   04/02/2021 0530  ceFEPIme (MAXIPIME) 2 g in sodium chloride 0.9 % 100 mL IVPB        2 g 200 mL/hr over 30 Minutes Intravenous  Once 04/01/2021 0517 04/03/2021 0549   04/17/2021 0530   vancomycin (VANCOREADY) IVPB 1250 mg/250 mL        1,250 mg 166.7 mL/hr over 90 Minutes Intravenous  Once 04/14/2021 0518 04/08/2021 0728        Time spent: 35 minutes-Greater than 50% of this time was spent in counseling, explanation of diagnosis, planning of further management, and coordination of care.  MEDICATIONS: Scheduled Meds:  chlorhexidine gluconate (MEDLINE KIT)  15 mL Mouth Rinse BID   Chlorhexidine Gluconate Cloth  6 each Topical Q2000   latanoprost  1 drop Both Eyes QHS   LORazepam  0.5 mg Per Tube QHS   LORazepam  0.5 mg Oral BID   mouth rinse  15 mL Mouth Rinse 10 times per day   metoCLOPramide (REGLAN) injection  10 mg Intravenous Q8H   PARoxetine  20 mg Per Tube q morning   polyvinyl alcohol  1 drop Both Eyes TID   senna-docusate  1 tablet Per Tube QHS   sodium chloride flush  3 mL Intravenous Q12H   Continuous Infusions:  sodium chloride     sodium chloride     famotidine (PEPCID) IV Stopped (04/09/21 2338)   morphine     sodium chloride     PRN Meds:.sodium chloride, acetaminophen **OR** acetaminophen, acetaminophen, antiseptic oral rinse, glycopyrrolate **OR** glycopyrrolate **OR** glycopyrrolate, haloperidol **OR** haloperidol **OR** haloperidol lactate, ipratropium-albuterol, LORazepam **OR** LORazepam **OR** LORazepam, mineral oil-hydrophilic petrolatum, morphine, ondansetron **OR** ondansetron (ZOFRAN) IV, polyvinyl alcohol, polyvinyl alcohol, sodium chloride flush   PHYSICAL EXAM: Vital signs: Vitals:   04/10/21 1200 04/10/21 1230 04/10/21 1300 04/10/21 1330  BP: 112/66 118/71 139/74 117/68  Pulse: (!) 126 (!) 120 (!) 118 (!) 112  Resp: _0 Temp:      TempSrc:      SpO2: 100% 100% 100% 100%  Weight:      Height:       Filed Weights   04/08/21 0400 04/09/21 0500 04/10/21 0416  Weight: 82.1 kg 82.5 kg 82.7 kg   Body mass index is 26.92 kg/m.   Gen Exam: Awake-follows some commands-but not in any distress.  Quadriplegic at baseline.   HEENT:atraumatic, normocephalic Chest: Transmitted upper airway sounds. CVS:S1S2 regular Abdomen:soft non tender, non distended Extremities:no edema Neurology: Quadriplegic. Skin: no rash   I have personally reviewed following labs and imaging studies  LABORATORY DATA: CBC: Recent Labs  Lab 04/06/21 0101 04/07/21 0125 04/08/21 0457 04/09/21 0753 04/10/21 0706  WBC 12.6* 15.0* 14.4* 18.2* 18.6*  NEUTROABS 9.6*  --   --  15.0*  --   HGB 13.5 12.5* 12.6*  12.5* 11.8*  HCT 40.9 40.3 40.4 40.3 38.7*  MCV 80.5 82.4 83.1 83.4 84.7  PLT 233 230 202 183 199     Basic Metabolic Panel: Recent Labs  Lab 04/04/21 1635 04/05/21 0702 04/06/21 0101 04/06/21 1204 04/06/21 1605 04/07/21 0125 04/08/21 0457 04/09/21 0753 04/10/21 0135  NA  --    < > 148* 141  --  148* 147* 141 143  K  --    < > 3.5  --  3.6 4.2 3.3* 3.8 3.1*  CL  --    < > 107  --   --  107 99 98 102  CO2  --    < > 37*  --   --  35* 36* 37* 36*  GLUCOSE  --    < > 343*  --   --  299* 229* 204* 190*  BUN  --    < > 10  --   --  _0 CREATININE  --    < > <0.30*  --   --  <0.30* <0.30* <0.30* <0.30*  CALCIUM  --    < > 8.8*  --   --  8.5* 8.7* 8.6* 7.7*  MG 2.0  --  2.1  --   --  2.2 2.1 2.3  --   PHOS 1.9*  --  <1.0* 1.9*  --  1.7* 2.6 2.2*  --    < > = values in this interval not displayed.     GFR: CrCl cannot be calculated (This lab value cannot be used to calculate CrCl because it is not a number: <0.30).  Liver Function Tests: No results for input(s): AST, ALT, ALKPHOS, BILITOT, PROT, ALBUMIN in the last 168 hours. No results for input(s): LIPASE, AMYLASE in the last 168 hours. No results for input(s): AMMONIA in the last 168 hours.  Coagulation Profile: No results for input(s): INR, PROTIME in the last 168 hours.  Cardiac Enzymes: No results for input(s): CKTOTAL, CKMB, CKMBINDEX, TROPONINI in the last 168 hours.  BNP (last 3 results) No results for input(s): PROBNP in the last 8760  hours.  Lipid Profile: No results for input(s): CHOL, HDL, LDLCALC, TRIG, CHOLHDL, LDLDIRECT in the last 72 hours.  Thyroid Function Tests: No results for input(s): TSH, T4TOTAL, FREET4, T3FREE, THYROIDAB in the last 72 hours.  Anemia Panel: No results for input(s): VITAMINB12, FOLATE, FERRITIN, TIBC, IRON, RETICCTPCT in the last 72 hours.  Urine analysis:    Component Value Date/Time   COLORURINE AMBER (A) 04/16/2021 0335   APPEARANCEUR CLOUDY (A) 03/25/2021 0335   LABSPEC 1.009 04/12/2021 0335   PHURINE 7.0 03/27/2021 0335   GLUCOSEU >=500 (A) 03/27/2021 0335   HGBUR MODERATE (A) 03/28/2021 0335   BILIRUBINUR NEGATIVE 04/01/2021 0335   KETONESUR NEGATIVE 03/25/2021 0335   PROTEINUR 100 (A) 04/04/2021 0335   NITRITE NEGATIVE 03/28/2021 0335   LEUKOCYTESUR LARGE (A) 04/12/2021 0335    Sepsis Labs: Lactic Acid, Venous    Component Value Date/Time   LATICACIDVEN 0.9 04/01/2021 1831    MICROBIOLOGY: Recent Results (from the past 240 hour(s))  Culture, Respiratory w Gram Stain     Status: None   Collection Time: 04/02/21  2:05 PM   Specimen: Tracheal Aspirate; Respiratory  Result Value Ref Range Status   Specimen Description TRACHEAL ASPIRATE  Final   Special Requests NONE  Final   Gram Stain   Final    ABUNDANT WBC PRESENT,BOTH PMN AND MONONUCLEAR FEW GRAM NEGATIVE RODS FEW  GRAM VARIABLE ROD RARE YEAST Performed at [facility], [place] [address], [place], [place] [address]    Culture MODERATE PSEUDOMONAS AERUGINOSA  Final   Report Status 04/06/2021 FINAL  Final   Organism ID, Bacteria PSEUDOMONAS AERUGINOSA  Final      Susceptibility   Pseudomonas aeruginosa - MIC*    CEFTAZIDIME 4 SENSITIVE Sensitive     CIPROFLOXACIN 1 SENSITIVE Sensitive     GENTAMICIN <=1 SENSITIVE Sensitive     IMIPENEM <=0.25 SENSITIVE Sensitive     PIP/TAZO <=4 SENSITIVE Sensitive     CEFEPIME 2 SENSITIVE Sensitive     * MODERATE PSEUDOMONAS AERUGINOSA    RADIOLOGY  STUDIES/RESULTS: DG Chest Port 1V same Day  Result Date: 04/10/2021 CLINICAL DATA:  Shortness of breath. EXAM: PORTABLE CHEST 1 VIEW COMPARISON:  04/05/2021. FINDINGS: Tracheostomy tube in stable position. Heart size stable. Prominent bilateral pulmonary infiltrates/edema again noted. Small left pleural effusion. No pneumothorax. IMPRESSION: 1. Tracheostomy tube in stable position. 2. Prominent bilateral pulmonary infiltrates/edema again noted. Small left pleural effusion. Electronically Signed   By: Marcello Moores  Register   On: 04/10/2021 08:57     LOS: 12 days   Oren Binet, MD  [facility]    To contact the attending provider between 7A-7P or the covering provider during after hours 7P-7A, please log into the web site [email or web address] and access using universal [place] password for that web site. If you do not have the password, please call the hospital operator.  04/10/2021, 2:29 PM

## 2021-04-10 NOTE — Progress Notes (Signed)
Daily Progress Note   Patient Name: Wayne Young       Date: 04/10/2021 DOB: 08-14-1956  Age: 65 y.o. MRN#: [id number] Attending Physician: Maretta Bees, MD Primary Care Physician: Hillery Aldo, NP Admit Date: 04-27-21  Reason for Consultation/Follow-up:  To discuss complex medical decision making related to patient's goals of care  Subjective: Wayne Young himself is alert and orientated today.  He is able to communicate.  His RN has been listening to him attentively and we join her.  Wayne Young expresses that he no longer wants tube feeds.  He is refusing medications.   I ask if he is ready to meet Jesus and he replies "Yes" and nods.    We called his family - Wayne Young, Wayne Young and Wayne Young and they come to the hospital.  They spent time in the room alone with him.   When we joined them Wayne Young relayed that he had expressed to them that he no longer wanted Tube feeds.  Wayne Young asked her husband are you ready to meet the Lord?  He replied "Yes" and nodded his head.  The family was present to hear and see his response.  They understand.    I let Wayne Young know that we will respect his wishes and now begin to focus only on his comfort and happiness.  I explained that we will relax visitation.   We need to discuss the appropriate time to withdraw the ventilator.   Assessment: Patient febrile.  Desating into the 70s and becoming bradycardic to 40 with normal care (turning).   Patient was able to express clearly to his family that he no longer wants tube feeds and is ready to meet the Lord.   Patient Profile/HPI:  65 y.o. male  with past medical history of ALS, wheelchair and vent dependent, HTN, DM2, RA, glaucoma, blind in R eye admitted on Apr 27, 2021 with pneumonia- likely aspiration due to  worsening dysphagia from ALS. Palliative consulted to assist with discussion re: PEG tube vs hospice. PEG was placed however, patient continues to be ill from pnuemonia. He likely is continuing to aspirate.       Length of Stay: 12   Vital Signs: BP (!) 164/77   Pulse (!) 122   Temp (!) 100.6 F (38.1 C) (Oral)   Resp 16   Ht 5'  9" (1.753 m)   Wt 82.7 kg   SpO2 100%   BMI 26.92 kg/m  SpO2: SpO2: 100 % O2 Device: O2 Device: Ventilator O2 Flow Rate: O2 Flow Rate (L/min): 0 L/min       Palliative Assessment/Data: 20%     Palliative Care Plan    Recommendations/Plan: Shifting to comfort measures only - family in agreement. Comfort orders initiated but with caution to not oversedate the patient - want him to have quality time with family.   Code Status:  DNR  Prognosis:  Hours - Days   Discharge Planning: Anticipated Hospital Death  Care plan was discussed with Family, RN, and attending MD.  Thank you for allowing the Palliative Medicine Team to assist in the care of this patient.  Total time spent:  35 min.     Greater than 50%  of this time was spent counseling and coordinating care related to the above assessment and plan.  Norvel Richards, PA-C Palliative Medicine  Please contact Palliative MedicineTeam phone at [phone] for questions and concerns between 7 am - 7 pm.   Please see AMION for individual provider pager numbers.

## 2021-04-10 NOTE — Progress Notes (Signed)
Nutrition Brief Note  Chart reviewed. Pallative care following; pt has expressed desire to discontinue TF Pt now transitioning to comfort care. . TF discontinued No further nutrition interventions planned at this time.  Please re-consult as needed.   Romelle Starcher MS, RDN, LDN, CNSC Registered Dietitian III Clinical Nutrition RD Pager and On-Call Pager Number Located in [place]

## 2021-04-11 ENCOUNTER — Other Ambulatory Visit: Payer: Medicaid Other | Admitting: Student

## 2021-04-18 ENCOUNTER — Ambulatory Visit: Payer: Medicare HMO | Admitting: Cardiovascular Disease

## 2021-04-18 NOTE — Progress Notes (Signed)
NAME:  Wayne [name], MRN:  [id number], DOB:  September 04, 1956, LOS: 83 ADMISSION DATE:  03/19/2021, CONSULTATION DATE: 03/28/2021 REFERRING MD:  Dr. Laverta [name], CHIEF COMPLAINT: Hypoxic respiratory failure  Brief Summary:   65 year old gentleman with history of ALS with tracheostomy on home ventilation, HTN, DM, arthritis, and glaucoma presenting from home with acute onset of hypoxia and increased work of breathing.  Patient has home health nurse who noted increased work of breathing and hypoxemia which began overnight.  Has been having increased secretions since Wednesday.  She had been suctioning them, was suctioned without secretions and given breathing treatment with minimal increase in O2.   In fact after the most recent suctioning home nurse noted that his oxygen saturations went down significantly and thus called EMS.  On EMS arrival, O2 sats in the 80s which increased to the 90s on 6 L from usually 5 L bled it.  Home health nurse also reported improving lower leg edema.  This is been improving over the last few months.  In ER, noted to be febrile at 101, minimally tachycardic at 106 and normotensive.  Labs significant for WBC 17, K 2.8, low BUN 7 and sCr 0.32, albumin 3.1, normal lactate, INR 1.3, SARS/ flu neg, UA cloudy with large leukocytes and pyuria.  CXR noted for confluent masslike opacities in the left mid lung, most suspicious for pneumonia, but underlying mass not excluded.  Blood and urine cultures sent and empirically started on vancomycin and cefepime.  TRH to admit, PCCM consulted for chronic vent management.    Significant Hospital Events: Including procedures, antibiotic start and stop dates in addition to other pertinent events   Admit to [facility] with sepsis, pneumonia/ UTI. Vanc and cefepime started.  6/14 Sputum growing proteus mirabilis, rare staph; abx changed to Unasyn 6/14 code stroke called and then cancelled for new pupil size discrepancy L>R and L pupil with new nonreactivity to  light, evaluated by neuro and received brain MRI which showed punctate area of ischemia in superior left parietal lobe, not candidate for tPA, pt at baseline mental status 6/14 palliative [name] following, code status discussions. Made DNR 6/15 PEG placed. FOB for complete opacification of left hemithorax. Sputum culture repeated.  6/17 cuff leak from vent. Checked abg and f/u cxr  6/19 Increased secretions / high peak pressures, hypoxemia + fever. MSSA+ Pseudomonas in trach culture. Rash on RUE.  Family met with palliative [name] > family desires in hospital death.  04-25-2023 Patient is now full comfort [name]. On Morphine gtt at low dose, awaiting family to come and say goodbyes , and will determine at that time when to withdraw vent.  Interim History / Subjective:  T max 100.7 No acute events - 40%, PEEP 5 Glucose 130-159 + 6230 net + since admission. Net negative 423 last 24, with 700 cc UO last 24  Objective   Blood pressure 117/68, pulse (!) 133, temperature (!) 100.6 F (38.1 C), temperature source Oral, resp. rate 16, height _0  (1.753 m), weight 82.7 kg, SpO2 97 %.    Vent Mode: PRVC FiO2 (%):  [40 %] 40 % Set Rate:  [16 bmp] 16 bmp Vt Set:  [560 mL] 560 mL PEEP:  [5 cmH20] 5 cmH20 Plateau Pressure:  [19 cmH20-31 cmH20] 24 cmH20   Intake/Output Summary (Last 24 hours) at 2021/04/24 0912 Last data filed at 04/24/21 0700 Gross per 24 hour  Intake 12.08 ml  Output 700 ml  Net -687.92 ml   Filed Weights   04/08/21  0400 04/09/21 0500 04/10/21 0416  Weight: 82.1 kg 82.5 kg 82.7 kg    Examination: General: chronically ill appearing adult male lying in bed on vent in NAD HEENT: MM pink/moist, trach midline, c/d/I, anicteric , trach is secure and intact without drainage Neuro: continues to track staff, anicteric, changes in extremities consistent with ALS, lip speaks to communicate CV: s1s2 RRR, no m/r/g PULM: non-labored on vent, Bilateral chest excursion, coarse throughout,  diminished per bases GI: soft, bsx4 active, TF have been discontinued at patient request Extremities: warm/dry, generalized dependent edema , brisk cap refill Skin: no rashes or lesions, intact   Resolved Hospital Problem list     Assessment & Plan:   Acute on chronic hypoxic respiratory failure 2/2 ALS & Left PNA - MSSA/Proteus/Pseudomonas  Ventilator and Trach dependent (POA) Mucous plugging and atelectasis  Sepsis due to extensive pneumonia DM w/ hyperglycemia Dysphagia now s/p PEG Fluid and electrolyte imbalance: hypophosphatemia, hypernatremia, hyperchloremia, hypokalemia   Uncontrolled hypertension 6/24: Now Comfort [name]>> on low dose Morphine gtt, awaiting family to say goodbyes , then will determine time to withdraw vent  -continue PRVC as rest mode ventilation  - antibiotics have been discontinued - NPO - Morphine gtt per palliation  -Duoneb TID for comfort -appreciate Palliative [name] assist with Wayne [name] -glucose control per primary   PCCM will sign off as patient has made decision for comfort [name] , is currently on low dose morphine, trach is intact and secure.  We appreciate the opportunity to assist in Wayne [name].    Best Practice (right click and "Reselect all SmartList Selections" daily)   Code Status:  DNR Last date of multidisciplinary goals of [name] discussion [per primary ] Disposition: [place] per Primary Team   Critical [name] Time: 15 minutes   Wayne Spatz, MSN, AGACNP-BC [place] for personal pager PCCM on call pager [phone]  [name] Apr 24, 2021, 9:12 AM   Please see [email or web address] for pager details.   From 7A-7P if no response, please call [phone] hospital use only, not for office use After hours, please call ELink [phone]

## 2021-04-18 NOTE — Progress Notes (Addendum)
Patient expired at 1740, family at bedside. Primary  care physician notified, e-link to notify [facility].

## 2021-04-18 NOTE — Progress Notes (Addendum)
Palliative Medicine Inpatient Follow Up Note   HPI: 65 y.o. male  with past medical history of ALS, wheelchair and vent dependent, HTN, DM2, RA, glaucoma, blind in R eye admitted on 04/10/2021 with pneumonia- likely aspiration due to worsening dysphagia from ALS. Palliative consulted to assist with discussion re: PEG tube vs hospice. PEG was placed however, patient continues to be ill from pnuemonia. He likely is continuing to aspirate.   Today's Discussion (2021-04-14):  *Please note that this is a verbal dictation therefore any spelling or grammatical errors are due to the "[employer]agon [employer] One" system interpretation.  Chart reviewed.   I checked in at bedside with patient RN, Wayne [name] and Wayne [name] shares that [facility] had just left. We discussed that at present Wayne [name] is on more of a modified comfort until patients family reaches the decisions for ventilation removal.  I spoke with patients wife, Wayne [name] this afternoon. We reviewed Nies present condition and poor prognosis. Discussed the goals to optimize comfort at this juncture. Wayne [name] shares that she is one her way into the hospital and when she arrives she plans to request for the ventilator to be turned off.  I was able to secure chat Wayne Lias, RN with this information.  _______________________________________________________________ Addendum:  I was able to meet at bedside with Wayne [name] this afternoon. We reviewed the difficulties she has endured coming to this decision and the relief she feels that Wayne [name] was able to speak for herself. I offered support through therapeutic listening. We reviewed the plan for the ventilator to be turned off once all family has had the chance to visit and then optimizing Wayne [name] comfort through multiple Palliative modalities of care. She understood this and was very thankful. She did express some distress given how hard her daughter was responding to Wayne [name] decision. We reviewed the stages of  grieving and how everyone expresses grief differently.   I was able to again touch base with Wayne Lias, RN regarding the plan for liberalizing Wayne [name] from ventilatory support this afternoon.   Additional Time: 25  Objective Assessment: Vital Signs Vitals:   04/14/21 1135 04/14/21 1136  BP:    Pulse: (!) 129   Resp: 16   Temp:    SpO2: 98% 99%    Intake/Output Summary (Last 24 hours) at 04-14-2021 1257 Last data filed at 04/14/2021 0700 Gross per 24 hour  Intake 12.08 ml  Output 700 ml  Net -687.92 ml   Last Weight  Most recent update: 04/10/2021  4:17 AM    Weight  82.7 kg (182 lb 5.1 oz)            Gen: Older AA M  HEENT: moist mucous membranes CV: Irregular rate and rhythm  PULM: On ventilator ABD: soft/nontender  EXT: No edema  Neuro: Alert and oriented   SUMMARY OF RECOMMENDATIONS   DNAR   Shifted to comfort measures only on 6/23  Comfort orders initiated  Plan to compassionately discontinue ventilation when family arrives this afternoon - Morphine can be titrated to symptoms at that time  Ongoing PMT support will be provided  Time Spent:35 Greater than 50% of the time was spent in counseling and coordination of care ______________________________________________________________________________________ Lamarr Lulas [facility] Palliative Medicine Team Team Cell Phone: [phone] Please utilize secure chat with additional questions, if there is no response within 30 minutes please call the above phone number  Palliative Medicine Team providers are available by phone from 7am to 7pm daily and can be reached through the team  cell phone.  Should this patient require assistance outside of these hours, please call the patient's attending physician.

## 2021-04-18 NOTE — Progress Notes (Signed)
PROGRESS NOTE        PATIENT DETAILS Name: Wayne [name] Age: 65 y.o. Sex: male Date of Birth: 03/09/1956 Admit Date: 04/10/2021 Admitting Physician Rise Patience, MD ESL:PNPYY, Rachel Moulds, NP  Brief Narrative: Patient is a 65 y.o. 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  Significant events: 6/11 >>admit to [facility] with sepsis, pneumonia/ UTI.  6/14>> code stroke called- brain MRI-punctate area of ischemia in superior left parietal lobe, not candidate for tPA, pt at baseline mental status 6/14>> palliative care following, code status discussions. Made DNR 6/15>>PEG placed. 6/15>> FOB for complete opacification of left hemithorax. Sputum culture repeated. 6/19>>Increased secretions / high peak pressures, hypoxemia + fever. MSSA+ Pseudomonas in trach culture. Rash on RUE.  Family met with palliative care > family desires in hospital death. 6/23>> febrile-hypotensive last night-RN reports need for frequent suctioning-desaturates when turning around to clean a bowel movement.  Palliative care met with family-transition to full comfort measures.  Antimicrobial therapy: Vancomycin: 6/10 x 1, 6/13 x 1 Cefepime: 6/10>> 6/13 Unasyn: 6/14>> 6/17 Meropenem: 6/18>> 6/22  Microbiology data: 6/11>> blood culture: Staphylococcus capitis 6/11>> sputum culture: MSSA, Proteus is all 6/15>> sputum culture: Pansensitive Pseudomonas  Procedures : 6/15>> bronchoscopy 6/15>> PEG tube placement  Consults: Pulmonology, IR, neurology, palliative care  DVT Prophylaxis :    Subjective: Sleeping comfortably-on morphine drip.  Spouse at bedside-Long discussion-she will go home-reach out to other family members-and see if they are ready to disconnect the ventilator.   Assessment/Plan: Acute on chronic hypoxic respiratory failure due to aspiration pneumonia/HCAP:  Chronically vent dependent-in spite of being on meropenem for the past several days-remains febrile.  Suspect this is from ongoing aspiration.  Palliative care met with family again on 6/23-and subsequently transitioned to full comfort measures.  Patient still on the ventilator-other family members to arrive-and want to spend some quality time with the patient.  Await further recommendations from the palliative care team.   Severe sepsis due to aspiration pneumonia/HCAP: Sepsis physiology transiently improved-however febrile-transiently hypotensive overnight-suspect that this is due to aspiration superimposed on Pseudomonas pneumonia.  No longer on antibiotics-focus is shifting to comfort measures.  See palliative care note as of 6/23.  Acute CVA: Evaluated by neurology-felt to be an incidental finding-no longer on aspirin.  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 and PEG tube in place: Unfortunately overtly aspirating secretions-some suspicion that he may be aspirating on PEG tube feeds as well.  As noted above-palliative care has met with family on 6/23 and has been transitioned to full comfort measures.  Anasarca: Likely due to hypoalbuminemia-no longer on Lasix.  HTN: BP reasonable-no longer on Lopressor.  Sinus tachycardia: Likely physiological-supportive care for now  HLD: No plans to resume Lipitor.  DM-2: Since transitioned to comfort measures-no plans to check CBGs-no longer on insulin.  Recent Labs    04/10/21 0206 04/10/21 0324 04/10/21 0751  GLUCAP 157* 131* 159*      Rheumatoid arthritis: On methotrexate-on hold due to sepsis/pneumonia.  Depression: Continue Paxil  Insomnia: On Ativan  Severe protein calorie malnutrition: Dietitian following-on tube  feeds  Pressure ulcer Pressure Injury 08/03/18 Stage II -  Partial thickness  loss of dermis presenting as a shallow open ulcer with a red, pink wound bed without slough. This is patchy areas of full thickness skin loss related to moisture associated skin damage, NOT a pressur (Active)  08/03/18 0800  Location: Sacrum  Location Orientation: Medial  Staging: Stage II -  Partial thickness loss of dermis presenting as a shallow open ulcer with a red, pink wound bed without slough.  Wound Description (Comments): This is patchy areas of full thickness skin loss related to moisture associated skin damage, NOT a pressure injury  Present on Admission: No     Pressure Injury 04/05/2021 Sacrum Medial Stage 1 -  Intact skin with non-blanchable redness of a localized area usually over a bony prominence. non-blanchable (Active)  04/05/2021 1600  Location: Sacrum  Location Orientation: Medial  Staging: Stage 1 -  Intact skin with non-blanchable redness of a localized area usually over a bony prominence.  Wound Description (Comments): non-blanchable  Present on Admission: Yes    Diet: Diet Order             Diet NPO time specified Except for: Sips with Meds  Diet effective now                    Code Status: DNR  Family Communication: None at bedside-palliative care spent extensive time with family.  Disposition Plan: Status is: Inpatient  Remains inpatient appropriate because:Inpatient level of care appropriate due to severity of illness  Dispo: The patient is from: Home              Anticipated d/c is to:  TBD              Patient currently is not medically stable to d/c.   Difficult to place patient No   Barriers to Discharge: Transition to comfort measures-anticipate in-hospital death once not on the ventilator.  Antimicrobial agents: Anti-infectives (From admission, onward)    Start     Dose/Rate Route Frequency Ordered Stop   04/05/21 0930  meropenem (MERREM) 1 g in sodium  chloride 0.9 % 100 mL IVPB        1 g 200 mL/hr over 30 Minutes Intravenous Every 8 hours 04/05/21 0843 04/09/21 2250   04/02/21 1540  ceFAZolin (ANCEF) IVPB 2g/100 mL premix        over 30 Minutes Intravenous Continuous PRN 04/02/21 1548 04/02/21 1540   04/01/21 1230  Ampicillin-Sulbactam (UNASYN) 3 g in sodium chloride 0.9 % 100 mL IVPB  Status:  Discontinued        3 g 200 mL/hr over 30 Minutes Intravenous Every 8 hours 04/01/21 1140 04/05/21 0824   03/31/21 2300  ceFEPIme (MAXIPIME) 2 g in sodium chloride 0.9 % 100 mL IVPB  Status:  Discontinued        2 g 200 mL/hr over 30 Minutes Intravenous Every 8 hours 03/31/21 1542 04/01/21 1044   03/31/21 2200  vancomycin (VANCOREADY) IVPB 750 mg/150 mL  Status:  Discontinued        750 mg 150 mL/hr over 60 Minutes Intravenous Every 12 hours 03/31/21 1143 04/01/21 1044   03/31/21 1045  vancomycin (VANCOREADY) IVPB 1250 mg/250 mL        1,250 mg 166.7 mL/hr over 90 Minutes Intravenous  Once 03/31/21 0957 03/31/21 1302   04/02/2021 1700  vancomycin (VANCOREADY) IVPB 1000 mg/200 mL  Status:  Discontinued        1,000 mg 200 mL/hr over 60 Minutes Intravenous Every 12 hours 03/30/2021  5170 04/03/2021 1115   03/21/2021 1300  ceFEPIme (MAXIPIME) 2 g in sodium chloride 0.9 % 100 mL IVPB  Status:  Discontinued        2 g 200 mL/hr over 30 Minutes Intravenous Every 8 hours 04/10/2021 0833 03/31/21 1542   04/05/2021 1300  vancomycin (VANCOREADY) IVPB 1500 mg/300 mL  Status:  Discontinued        1,500 mg 150 mL/hr over 120 Minutes Intravenous Every 8 hours 04/02/2021 0941 03/28/2021 0943   04/09/2021 0530  vancomycin (VANCOREADY) IVPB 1000 mg/200 mL  Status:  Discontinued        1,000 mg 200 mL/hr over 60 Minutes Intravenous  Once 04/13/2021 0517 03/23/2021 0518   03/27/2021 0530  ceFEPIme (MAXIPIME) 2 g in sodium chloride 0.9 % 100 mL IVPB        2 g 200 mL/hr over 30 Minutes Intravenous  Once 03/19/2021 0517 03/19/2021 0549   04/08/2021 0530  vancomycin (VANCOREADY) IVPB 1250  mg/250 mL        1,250 mg 166.7 mL/hr over 90 Minutes Intravenous  Once 04/12/2021 0518 03/27/2021 0728        Time spent: 15 minutes-Greater than 50% of this time was spent in counseling, explanation of diagnosis, planning of further management, and coordination of care.  MEDICATIONS: Scheduled Meds:  chlorhexidine gluconate (MEDLINE KIT)  15 mL Mouth Rinse BID   Chlorhexidine Gluconate Cloth  6 each Topical Q2000   latanoprost  1 drop Both Eyes QHS   LORazepam  0.5 mg Per Tube QHS   LORazepam  0.5 mg Oral BID   mouth rinse  15 mL Mouth Rinse 10 times per day   metoCLOPramide (REGLAN) injection  10 mg Intravenous Q8H   PARoxetine  20 mg Per Tube q morning   polyvinyl alcohol  1 drop Both Eyes TID   senna-docusate  1 tablet Per Tube QHS   sodium chloride flush  3 mL Intravenous Q12H   Continuous Infusions:  sodium chloride     sodium chloride     famotidine (PEPCID) IV Stopped (04/09/21 2338)   morphine 0.25 mg/hr (Apr 28, 2021 0700)   sodium chloride     PRN Meds:.sodium chloride, acetaminophen **OR** acetaminophen, acetaminophen, antiseptic oral rinse, glycopyrrolate **OR** glycopyrrolate **OR** glycopyrrolate, haloperidol **OR** haloperidol **OR** haloperidol lactate, ipratropium-albuterol, LORazepam **OR** LORazepam **OR** LORazepam, mineral oil-hydrophilic petrolatum, morphine, ondansetron **OR** ondansetron (ZOFRAN) IV, polyvinyl alcohol, polyvinyl alcohol, sodium chloride flush   PHYSICAL EXAM: Vital signs: Vitals:   Apr 28, 2021 0700 2021/04/28 0742 04/28/2021 1135 04-28-21 1136  BP:      Pulse: (!) 133  (!) 129   Resp: 16 16 16    Temp:      TempSrc:      SpO2: 97% 97% 98% 99%  Weight:      Height:       Filed Weights   04/08/21 0400 04/09/21 0500 04/10/21 0416  Weight: 82.1 kg 82.5 kg 82.7 kg   Body mass index is 26.92 kg/m.   Gen Exam: Awake-follows some commands-but not in any distress.  Quadriplegic at baseline.  HEENT:atraumatic, normocephalic Chest: Transmitted  upper airway sounds. CVS:S1S2 regular Abdomen:soft non tender, non distended Extremities:no edema Neurology: Quadriplegic. Skin: no rash   I have personally reviewed following labs and imaging studies  LABORATORY DATA: CBC: Recent Labs  Lab 04/06/21 0101 04/07/21 0125 04/08/21 0457 04/09/21 0753 04/10/21 0706  WBC 12.6* 15.0* 14.4* 18.2* 18.6*  NEUTROABS 9.6*  --   --  15.0*  --   HGB 13.5  12.5* 12.6* 12.5* 11.8*  HCT 40.9 40.3 40.4 40.3 38.7*  MCV 80.5 82.4 83.1 83.4 84.7  PLT 233 230 202 183 199     Basic Metabolic Panel: Recent Labs  Lab 04/04/21 1635 04/05/21 0702 04/06/21 0101 04/06/21 1204 04/06/21 1605 04/07/21 0125 04/08/21 0457 04/09/21 0753 04/10/21 0135  NA  --    < > 148* 141  --  148* 147* 141 143  K  --    < > 3.5  --  3.6 4.2 3.3* 3.8 3.1*  CL  --    < > 107  --   --  107 99 98 102  CO2  --    < > 37*  --   --  35* 36* 37* 36*  GLUCOSE  --    < > 343*  --   --  299* 229* 204* 190*  BUN  --    < > 10  --   --  14 19 17 18   CREATININE  --    < > <0.30*  --   --  <0.30* <0.30* <0.30* <0.30*  CALCIUM  --    < > 8.8*  --   --  8.5* 8.7* 8.6* 7.7*  MG 2.0  --  2.1  --   --  2.2 2.1 2.3  --   PHOS 1.9*  --  <1.0* 1.9*  --  1.7* 2.6 2.2*  --    < > = values in this interval not displayed.     GFR: CrCl cannot be calculated (This lab value cannot be used to calculate CrCl because it is not a number: <0.30).  Liver Function Tests: No results for input(s): AST, ALT, ALKPHOS, BILITOT, PROT, ALBUMIN in the last 168 hours. No results for input(s): LIPASE, AMYLASE in the last 168 hours. No results for input(s): AMMONIA in the last 168 hours.  Coagulation Profile: No results for input(s): INR, PROTIME in the last 168 hours.  Cardiac Enzymes: No results for input(s): CKTOTAL, CKMB, CKMBINDEX, TROPONINI in the last 168 hours.  BNP (last 3 results) No results for input(s): PROBNP in the last 8760 hours.  Lipid Profile: No results for input(s): CHOL,  HDL, LDLCALC, TRIG, CHOLHDL, LDLDIRECT in the last 72 hours.  Thyroid Function Tests: No results for input(s): TSH, T4TOTAL, FREET4, T3FREE, THYROIDAB in the last 72 hours.  Anemia Panel: No results for input(s): VITAMINB12, FOLATE, FERRITIN, TIBC, IRON, RETICCTPCT in the last 72 hours.  Urine analysis:    Component Value Date/Time   COLORURINE AMBER (A) 04/05/2021 0335   APPEARANCEUR CLOUDY (A) 04/09/2021 0335   LABSPEC 1.009 03/26/2021 0335   PHURINE 7.0 04/14/2021 0335   GLUCOSEU >=500 (A) 04/14/2021 0335   HGBUR MODERATE (A) 04/14/2021 0335   BILIRUBINUR NEGATIVE 04/04/2021 0335   KETONESUR NEGATIVE 04/06/2021 0335   PROTEINUR 100 (A) 03/28/2021 0335   NITRITE NEGATIVE 03/30/2021 0335   LEUKOCYTESUR LARGE (A) 04/13/2021 0335    Sepsis Labs: Lactic Acid, Venous    Component Value Date/Time   LATICACIDVEN 0.9 04/01/2021 1831    MICROBIOLOGY: Recent Results (from the past 240 hour(s))  Culture, Respiratory w Gram Stain     Status: None   Collection Time: 04/02/21  2:05 PM   Specimen: Tracheal Aspirate; Respiratory  Result Value Ref Range Status   Specimen Description TRACHEAL ASPIRATE  Final   Special Requests NONE  Final   Gram Stain   Final    ABUNDANT WBC PRESENT,BOTH PMN AND MONONUCLEAR FEW GRAM NEGATIVE  RODS FEW GRAM VARIABLE ROD RARE YEAST Performed at [facility], [place] [address] [place], [place] [address]    Culture MODERATE PSEUDOMONAS AERUGINOSA  Final   Report Status 04/06/2021 FINAL  Final   Organism ID, Bacteria PSEUDOMONAS AERUGINOSA  Final      Susceptibility   Pseudomonas aeruginosa - MIC*    CEFTAZIDIME 4 SENSITIVE Sensitive     CIPROFLOXACIN 1 SENSITIVE Sensitive     GENTAMICIN <=1 SENSITIVE Sensitive     IMIPENEM <=0.25 SENSITIVE Sensitive     PIP/TAZO <=4 SENSITIVE Sensitive     CEFEPIME 2 SENSITIVE Sensitive     * MODERATE PSEUDOMONAS AERUGINOSA    RADIOLOGY STUDIES/RESULTS: DG Chest Port 1V same Day  Result Date:  04/10/2021 CLINICAL DATA:  Shortness of breath. EXAM: PORTABLE CHEST 1 VIEW COMPARISON:  04/05/2021. FINDINGS: Tracheostomy tube in stable position. Heart size stable. Prominent bilateral pulmonary infiltrates/edema again noted. Small left pleural effusion. No pneumothorax. IMPRESSION: 1. Tracheostomy tube in stable position. 2. Prominent bilateral pulmonary infiltrates/edema again noted. Small left pleural effusion. Electronically Signed   By: Marcello Moores  Register   On: 04/10/2021 08:57     LOS: 13 days   Oren Binet, MD  [facility]    To contact the attending provider between 7A-7P or the covering provider during after hours 7P-7A, please log into the web site [email or web address] and access using universal [employer] password for that web site. If you do not have the password, please call the hospital operator.  2021-05-07, 11:58 AM

## 2021-04-18 NOTE — Death Summary Note (Signed)
DEATH SUMMARY   Patient Details  Name: Wayne [name] MRN: [id number] DOB: February 14, 1956  Admission/Discharge Information   Admit Date:  Apr 09, 2021  Date of Death: Date of Death: April 22, 2021  Time of Death: Time of Death: 12-25-38  Length of Stay: 12-12-2022  Referring Physician: Cipriano Mile, NP   Reason(s) for Hospitalization   Acute on chronic hypoxic respiratory failure Sepsis due to aspiration pneumonia  Diagnoses  Preliminary cause of death:  Secondary Diagnoses (including complications and co-morbidities):  Active Problems:   Rheumatoid arthritis with positive rheumatoid factor (HCC)   Protein-calorie malnutrition, severe   Pressure injury of skin   Acute on chronic respiratory failure with hypoxemia (HCC)   ALS (amyotrophic lateral sclerosis) (HCC)   Hyperlipidemia   Hypertension   Type 2 diabetes mellitus, with long-term current use of insulin (HCC)   CAP (community acquired pneumonia)   Hypokalemia   Tracheostomy dependent (HCC)   Hypoalbuminemia   History of TB (tuberculosis)   Cerebral thrombosis with cerebral infarction   Dysphagia   Brief Hospital Course (including significant findings, care, treatment, and services provided and events leading to death)   Brief Narrative: Patient is a 65 y.o. 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   Significant events: 2023-04-10 >>admit to [facility] with sepsis, pneumonia/ UTI. 6/14>> code stroke called- brain MRI-punctate area of ischemia in superior left parietal lobe, not candidate for tPA, pt at baseline mental status 6/14>> palliative care following, code status discussions. Made DNR 6/15>>PEG placed. 6/15>> FOB for complete opacification of left hemithorax. Sputum culture repeated. 6/19>>Increased secretions / high peak pressures, hypoxemia + fever. MSSA+ Pseudomonas in trach culture. Rash on RUE.  Family met with palliative care  > family desires in hospital death. 6/23>> febrile-hypotensive last night-RN reports need for frequent suctioning-desaturates when turning around to clean a bowel movement.  Palliative care met with family-transition to full comfort measures.   Antimicrobial therapy: Vancomycin: 6/10 x 1, 6/13 x 1 Cefepime: 6/10>> 6/13 Unasyn: 6/14>> 6/17 Meropenem: 6/18>> 6/22   Microbiology data: 6/11>> blood culture: Staphylococcus capitis 6/11>> sputum culture: MSSA, Proteus is all 6/15>> sputum culture: Pansensitive Pseudomonas   Procedures : 6/15>> bronchoscopy 6/15>> PEG tube placement   Consults: Pulmonology, IR, neurology, palliative care   Hospital course by problem lis Acute on chronic hypoxic respiratory failure due to aspiration pneumonia/HCAP: Chronically vent dependent-in spite of being on meropenem for several days remained febrile-this is likely due to ongoing aspiration pneumonia.  Palliative care followed closely-and was subsequently transitioned to comfort measures on 6/23.  On 6/24-after another palliative care meeting-patient was liberated off the ventilator and subsequently passed away.    Severe sepsis due to aspiration pneumonia/HCAP: Sepsis physiology transiently improved-however patient remained febrile-transiently hypotensive-this was highly suspicious for ongoing aspiration pneumonia superimposed on Pseudomonas pneumonia.  He was on broad-spectrum antimicrobial therapy with meropenem-after he was transitioned to comfort care-all antimicrobial therapy was discontinued.     Acute CVA: Evaluated by neurology-felt to be an incidental finding-no longer on aspirin.   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 and PEG tube in place: Unfortunately overtly aspirating secretions-some  suspicion that he may be aspirating on PEG tube feeds as well.  As noted above-palliative care  met with family on 6/23 and was transitioned to full comfort measures.  Anasarca: Likely due to hypoalbuminemia-was managed with Lasix.   HTN: Was managed with Lopressor   Sinus tachycardia: Likely physiological-supportive care was provided  HLD: Previously on Lipitor   DM-2: He was managed with insulin-but once he was transitioned to comfort measures-he was no longer on insulin.  CBGs were no longer checked.   Rheumatoid arthritis: On methotrexate-which was held due to sepsis/pneumonia.   Depression: was on Paxil   Insomnia: was on Ativan   Severe protein calorie malnutrition: Dietitian followed was on tube feeds  Pressure Injury 08/03/18 Stage II -  Partial thickness loss of dermis presenting as a shallow open ulcer with a red, pink wound bed without slough. This is patchy areas of full thickness skin loss related to moisture associated skin damage, NOT a pressur (Active)  08/03/18 0800  Location: Sacrum  Location Orientation: Medial  Staging: Stage II -  Partial thickness loss of dermis presenting as a shallow open ulcer with a red, pink wound bed without slough.  Wound Description (Comments): This is patchy areas of full thickness skin loss related to moisture associated skin damage, NOT a pressure injury  Present on Admission: No     Pressure Injury 03/30/2021 Sacrum Medial Stage 1 -  Intact skin with non-blanchable redness of a localized area usually over a bony prominence. non-blanchable (Active)  04/03/2021 1600  Location: Sacrum  Location Orientation: Medial  Staging: Stage 1 -  Intact skin with non-blanchable redness of a localized area usually over a bony prominence.  Wound Description (Comments): non-blanchable  Present on Admission: Yes        Pertinent Labs and Studies  Significant Diagnostic Studies CT ANGIO HEAD NECK W WO CM  Result Date: 04/02/2021 CLINICAL DATA:   Punctate superior left parietal infarction shown by MRI. Clinical concern for retinal artery occlusion. EXAM: CT ANGIOGRAPHY HEAD AND NECK TECHNIQUE: Multidetector CT imaging of the head and neck was performed using the standard protocol during bolus administration of intravenous contrast. Multiplanar CT image reconstructions and MIPs were obtained to evaluate the vascular anatomy. Carotid stenosis measurements (when applicable) are obtained utilizing NASCET criteria, using the distal internal carotid diameter as the denominator. CONTRAST:  89m OMNIPAQUE IOHEXOL 350 MG/ML SOLN COMPARISON:  MRI same day FINDINGS: CT HEAD FINDINGS Brain: Brain shows mild age related volume loss without evidence of old or acute small or large vessel infarction by CT. No mass, hemorrhage, hydrocephalus or extra-axial collection. Vascular: There is atherosclerotic calcification of the major vessels at the base of the brain. Skull: Negative Sinuses: Clear/normal Orbits: None Review of the MIP images confirms the above findings CTA NECK FINDINGS Aortic arch: Aortic atherosclerosis. Innominate artery origin not included in the region studied. No origin stenosis of the left common carotid artery or left subclavian artery. Right carotid system: Common carotid artery widely patent to the bifurcation. Carotid bifurcation is normal without soft or calcified plaque. Cervical ICA is widely patent. Left carotid system: Common carotid artery widely patent to bifurcation. Minimal soft plaque at the bifurcation but no stenosis. Cervical ICA widely patent to the skull base. Vertebral arteries: Both vertebral artery origins are widely patent. Both vertebral arteries appear normal through the cervical region to the foramen magnum. 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 Otherwise unremarkable. Other neck: No mass or lymphadenopathy.  Tracheostomy in place. Upper chest: Left effusion with consolidation of the left upper lobe. Review of the MIP  images confirms the above findings CTA HEAD FINDINGS Anterior circulation: Both internal carotid arteries are patent through  the skull base and siphon regions. Mild siphon atherosclerotic calcification but without stenosis greater than 30%. The anterior and middle cerebral vessels are patent, but are small and somewhat irregular. No large or medium vessel occlusion. There is stenosis both supraclinoid internal carotid arteries estimated at 50% on the right and 30% on the left. Posterior circulation: Both vertebral arteries are patent through the foramen magnum. There is atherosclerotic calcification scratched at there is atherosclerotic disease of both vertebral artery V4 segments but both do reach the basilar. The basilar is a small irregular vessel does show flow. Superior cerebellar and posterior cerebral arteries show flow, but are small vessels. Venous sinuses: Patent and normal. Anatomic variants: None significant. Review of the MIP images confirms the above findings IMPRESSION: No carotid bifurcation stenosis.  Minimal atherosclerotic plaque. Intracranial vascular disease with diffuse narrowing of the intracranial branch vessels most consistent with atherosclerosis. Diffuse vaso spasm could be considered, but there does not appear to be an etiology for that. 50% stenosis of the supraclinoid ICA on the right and 30% stenosis of the supraclinoid ICA on left. Electronically Signed   By: Nelson Chimes M.D.   On: 04/02/2021 12:58   DG Skull 1-3 Views  Result Date: 04/02/2021 CLINICAL DATA:  Pre MRI EXAM: SKULL - 1-3 VIEW COMPARISON:  None. FINDINGS: There is no evidence of metallic foreign body within the orbits. No significant bone abnormality identified. IMPRESSION: No evidence of metallic foreign body within the orbits. Electronically Signed   By: Rolm Baptise M.D.   On: 04/02/2021 00:05   DG Pelvis 1-2 Views  Result Date: 04/02/2021 CLINICAL DATA:  Pre MRI EXAM: PELVIS - 1-2 VIEW COMPARISON:  None.  FINDINGS: Skin staples are noted in the left inguinal region. There is a metallic battery like foreign body seen in the right lower quadrant overlying the iliac crest of unknown etiology or location. Recommend clinical correlation. IMPRESSION: Battery like foreign body projects over the right iliac crest of unknown etiology or exact location. Left inguinal skin staples. Electronically Signed   By: Rolm Baptise M.D.   On: 04/02/2021 00:06   DG Abd 1 View  Result Date: 04/02/2021 CLINICAL DATA:  MRI screening EXAM: ABDOMEN - 1 VIEW COMPARISON:  12/06/2020 FINDINGS: Electronic device projects in the right lower quadrant. Unclear whether this is internal or external. This was present on 12/06/2020, but in a different location. IMPRESSION: Electronic device in the right lower quadrant, indeterminate for MRI. Unclear if this device is internal or external. Electronically Signed   By: Ulyses Jarred M.D.   On: 04/02/2021 00:11   CT CHEST WO CONTRAST  Result Date: 03/24/2021 CLINICAL DATA:  Pneumonia with effusion or abscess suspected. New hypoxia. EXAM: CT CHEST WITHOUT CONTRAST TECHNIQUE: Multidetector CT imaging of the chest was performed following the standard protocol without IV contrast. COMPARISON:  Radiograph from earlier today.  Chest CT 07/27/2018 FINDINGS: Cardiovascular: Normal heart size. No pericardial effusion. Mild atheromatous calcification of the aorta and coronaries. Mediastinum/Nodes: Generous sized lymph nodes which may be reactive in this setting. Chronic calcified mediastinal nodes from remote granulomatous disease. Lungs/Pleura: Tracheostomy tube in place. Central airway debris at both mainstem bronchi with layering. Extensive consolidative opacity with some volume loss on the left lung where there is a small parapneumonic effusion. No cavitary changes. More atelectatic type opacity at the right base. No edema. No air leak. Upper Abdomen: Negative Musculoskeletal: No acute finding IMPRESSION:  1. Extensive pneumonia on the left. Recommend follow-up to clearing. 2. Extensive airway  debris narrowing bilateral mainstem bronchi. 3. Small pleural effusions. Electronically Signed   By: Monte Fantasia M.D.   On: 03/26/2021 11:14   CT ABDOMEN W CONTRAST  Result Date: 04/02/2021 CLINICAL DATA:  Stroke, preop planning for gastrostomy catheter EXAM: CT ABDOMEN WITH CONTRAST TECHNIQUE: Multidetector CT imaging of the abdomen was performed using the standard protocol following bolus administration of intravenous contrast. CONTRAST:  74m OMNIPAQUE IOHEXOL 350 MG/ML SOLN COMPARISON:  CT chest 04/10/2021 and previous FINDINGS: Lower chest: Small left pleural effusion as before. Fairly dense consolidation in most of the visualized left lung base, and worsening airspace consolidation posteriorly in the visualized right lower lobe. Hepatobiliary: Scattered subcentimeter low-attenuation lesions statistically most likely cysts in the absence of a history of primary carcinoma, but too small to accurately characterize. Gallbladder is nondistended. No biliary ductal dilatation. Pancreas: Unremarkable. No pancreatic ductal dilatation or surrounding inflammatory changes. Spleen: Normal in size without focal abnormality. Adrenals/Urinary Tract: Adrenal glands unremarkable. No hydronephrosis. Stomach/Bowel: Stomach is incompletely distended. There is safe percutaneous window for gastrostomy tube placement. Visualized small bowel and colon are nondilated, unremarkable. Vascular/Lymphatic: Aortic Atherosclerosis (ICD10-170.0) without aneurysm or stenosis. No abdominal or mesenteric adenopathy. Other: No ascites.  No free air. Musculoskeletal: No acute or significant osseous findings. IMPRESSION: 1. No evident anatomic contraindications to percutaneous gastrostomy placement. 2. Worsening bibasilar pulmonary airspace disease with persistent small left effusion. Electronically Signed   By: DLucrezia EuropeM.D.   On: 04/02/2021 13:08    MR BRAIN WO CONTRAST  Result Date: 04/02/2021 CLINICAL DATA:  Neuro deficit, acute, stroke suspected Concern for retinal artery occlusion EXAM: MRI HEAD WITHOUT CONTRAST TECHNIQUE: Multiplanar, multiecho pulse sequences of the brain and surrounding structures were obtained without intravenous contrast. COMPARISON:  07/28/2018 FINDINGS: Brain: Punctate focus of abnormal diffusion restriction in the superior left parietal lobe. No acute or chronic hemorrhage. There is multifocal hyperintense T2-weighted signal within the white matter. Parenchymal volume and CSF spaces are normal. The midline structures are normal. Vascular: Major flow voids are preserved. Skull and upper cervical spine: Normal calvarium and skull base. Visualized upper cervical spine and soft tissues are normal. Sinuses/Orbits:No paranasal sinus fluid levels or advanced mucosal thickening. No mastoid or middle ear effusion. Normal orbits. IMPRESSION: 1. Punctate focus of acute ischemia in the superior left parietal lobe. No hemorrhage or mass effect. 2. Findings of chronic microvascular ischemia. Electronically Signed   By: KUlyses JarredM.D.   On: 04/02/2021 01:09   IR GASTROSTOMY TUBE MOD SED  Result Date: 04/02/2021 INDICATION: 65year old gentleman with history of stroke and dysphagia presents to IR for percutaneous gastrostomy tube placement. EXAM: Fluoroscopy guided percutaneous gastrostomy tube placement MEDICATIONS: Ancef 2 g IV; Antibiotics were administered within 1 hour of the procedure. Glucagon 1 mg IV ANESTHESIA/SEDATION: Versed 1 mg IV; Fentanyl 50 mcg IV Moderate Sedation Time:  9 minutes The patient was continuously monitored during the procedure by the interventional radiology nurse under my direct supervision. CONTRAST:  20 mL of Omnipaque 300-administered into the gastric lumen. FLUOROSCOPY TIME:  Fluoroscopy Time: 2 minutes 0 seconds (7 mGy). COMPLICATIONS: None immediate. PROCEDURE: Informed written consent was obtained  from the patient's wife after a thorough discussion of the procedural risks, benefits and alternatives. All questions were addressed. Maximal Sterile Barrier Technique was utilized including caps, mask, sterile gowns, sterile gloves, sterile drape, hand hygiene and skin antiseptic. A timeout was performed prior to the initiation of the procedure. An orogastric tube was placed with fluoroscopic guidance. The anterior abdomen was prepped  and draped in sterile fashion. Ultrasound evaluation of the left upper quadrant was performed to confirm the position of the liver. The skin and subcutaneous tissues were anesthetized with 1% lidocaine. Single gastropexy was placed. 17 gauge needle was directed into the distended stomach with fluoroscopic guidance. A wire was advanced into the stomach. 9-French vascular sheath was placed and the orogastric tube was snared using a Gooseneck snare device. The orogastric tube and snare were pulled out of the patient's mouth. The snare device was connected to a 20-French gastrostomy tube. The snare device and gastrostomy tube were pulled through the patient's mouth and out the anterior abdominal wall. The gastrostomy tube was cut to an appropriate length. Contrast injection through gastrostomy tube confirmed placement within the stomach. G tube was flushed and covered with sterile dressing. IMPRESSION: 20 French gastrostomy tube placed utilizing fluoroscopic guidance. Electronically Signed   By: Miachel Roux M.D.   On: 04/02/2021 16:19   DG CHEST PORT 1 VIEW  Result Date: 04/05/2021 CLINICAL DATA:  Acute on chronic respiratory failure with hypoxia. EXAM: PORTABLE CHEST 1 VIEW COMPARISON:  04/03/2021 FINDINGS: Tracheostomy tube appears to be stable in position. Again noted are airspace densities in the mid and lower left lung and airspace densities in the right lower lung. Stable consolidation in the retrocardiac space and left lung base. Negative for a pneumothorax. IMPRESSION: Stable  chest radiograph findings with bilateral airspace densities, left side greater than right. Persistent consolidation at the left lung base with left pleural fluid. Electronically Signed   By: Markus Daft M.D.   On: 04/05/2021 09:08   DG CHEST PORT 1 VIEW  Result Date: 04/02/2021 CLINICAL DATA:  Left hemithorax opacification follow-up EXAM: PORTABLE CHEST 1 VIEW COMPARISON:  04/02/2021 FINDINGS: Tracheostomy tube is noted in satisfactory position. Patchy infiltrate is noted throughout the left lung as well as the right lung base. Following bronchoscopy significant improved aeration in the left lung is noted. No sizable effusion is seen. IMPRESSION: Bilateral infiltrates left greater than right with significant improved aeration in the left lung following bronchoscopy and suction. Electronically Signed   By: Inez Catalina M.D.   On: 04/02/2021 15:39   DG CHEST PORT 1 VIEW  Result Date: 04/02/2021 CLINICAL DATA:  Hypoxia EXAM: PORTABLE CHEST 1 VIEW COMPARISON:  04/06/2021 FINDINGS: Cardiac shadow is stable in appearance. Tracheostomy tube is noted. Right lung demonstrates mild basilar atelectasis. Left lung demonstrates increasing consolidation and pleural effusion with only minimal residual aerated lung. No acute bony abnormality is noted. IMPRESSION: Significant increase in consolidation and opacification within the left hemithorax consistent with worsening infiltrate and likely underlying effusion. Electronically Signed   By: Inez Catalina M.D.   On: 04/02/2021 08:10   DG Chest Port 1 View  Result Date: 04/12/2021 CLINICAL DATA:  Concern for sepsis EXAM: PORTABLE CHEST 1 VIEW COMPARISON:  Radiograph 08/20/2018, CT 07/27/2018 FINDINGS: Confluent masslike opacities present in the left mid lung and retrocardiac space. No pneumothorax or visible effusion. Asymmetrically diminished left lung volumes are similar to the recent comparison radiography albeit with a more normal appearance on the somewhat more remote  CT comparison. Tracheostomy tube in place. Cardiomediastinal contours are stable from prior with a calcified aorta. Degenerative changes are present in the imaged spine and shoulders. No acute osseous or soft tissue abnormality. IMPRESSION: Confluent masslike opacities present in the left mid lung. While this may be infectious/pneumonia in the setting of suspected sepsis, underlying mass is not fully excluded. Could consider short-term (6 week) repeat  imaging following therapeutic intervention. Otherwise, consider CT imaging for further characterization. Electronically Signed   By: Lovena Le M.D.   On: 03/28/2021 04:24   DG Chest Port 1V same Day  Result Date: 04/10/2021 CLINICAL DATA:  Shortness of breath. EXAM: PORTABLE CHEST 1 VIEW COMPARISON:  04/05/2021. FINDINGS: Tracheostomy tube in stable position. Heart size stable. Prominent bilateral pulmonary infiltrates/edema again noted. Small left pleural effusion. No pneumothorax. IMPRESSION: 1. Tracheostomy tube in stable position. 2. Prominent bilateral pulmonary infiltrates/edema again noted. Small left pleural effusion. Electronically Signed   By: Marcello Moores  Register   On: 04/10/2021 08:57   ECHOCARDIOGRAM COMPLETE  Result Date: 04/02/2021    ECHOCARDIOGRAM REPORT   Patient Name:   KACEE KOREN Date of Exam: 04/02/2021 Medical Rec #:  [id number]       Height:       69.0 in Accession #:    [id number]      Weight:       175.7 lb Date of Birth:  01-16-1956       BSA:          1.955 m Patient Age:    39 years        BP:           127/54 mmHg Patient Gender: M               HR:           101 bpm. Exam Location:  Inpatient Procedure: 2D Echo, Cardiac Doppler and Color Doppler Indications:    CVA  History:        Patient has prior history of Echocardiogram examinations, most                 recent 09/26/2020. Signs/Symptoms:Dyspnea and Shortness of                 Breath; Risk Factors:Dyslipidemia, Diabetes and Hypertension.  Sonographer:    [place]  Referring Phys: [id number] [place]  1. Left ventricular ejection fraction, by estimation, is 55 to 60%. The left ventricle has normal function. The left ventricle has no regional wall motion abnormalities. Left ventricular diastolic parameters are indeterminate.  2. Right ventricular systolic function is normal. The right ventricular size is moderately enlarged. Tricuspid regurgitation signal is inadequate for assessing PA pressure.  3. The mitral valve is normal in structure. No evidence of mitral valve regurgitation.  4. The aortic valve was not well visualized. Aortic valve regurgitation is not visualized.  5. The inferior vena cava is normal in size with greater than 50% respiratory variability, suggesting right atrial pressure of 3 mmHg. Comparison(s): A prior study was performed on 09/26/2020. Prior images reviewed side by side. Likely no change; this study is more technically difficult. FINDINGS  Left Ventricle: Left ventricular ejection fraction, by estimation, is 55 to 60%. The left ventricle has normal function. The left ventricle has no regional wall motion abnormalities. The left ventricular internal cavity size was normal in size. There is  no left ventricular hypertrophy. Left ventricular diastolic parameters are indeterminate. Right Ventricle: The right ventricular size is moderately enlarged. No increase in right ventricular wall thickness. Right ventricular systolic function is normal. Tricuspid regurgitation signal is inadequate for assessing PA pressure. Left Atrium: Left atrial size was normal in size. Right Atrium: Right atrial size was normal in size. Pericardium: There is no evidence of pericardial effusion. Mitral Valve: The mitral valve is normal in structure. No evidence of mitral valve regurgitation. Tricuspid Valve:  The tricuspid valve is grossly normal. Tricuspid valve regurgitation is not demonstrated. Aortic Valve: The aortic valve was not well visualized. Aortic valve  regurgitation is not visualized. Aortic valve mean gradient measures 4.0 mmHg. Aortic valve peak gradient measures 7.5 mmHg. Pulmonic Valve: The pulmonic valve was not well visualized. Pulmonic valve regurgitation is not visualized. Aorta: The aortic root was not well visualized. Venous: The inferior vena cava is normal in size with greater than 50% respiratory variability, suggesting right atrial pressure of 3 mmHg. IAS/Shunts: The atrial septum is grossly normal.   LV Volumes (MOD) LV vol d, MOD A2C: 55.6 ml Diastology LV vol d, MOD A4C: 32.9 ml LV e' medial:    2.83 cm/s LV vol s, MOD A2C: 23.2 ml LV E/e' medial:  23.7 LV vol s, MOD A4C: 18.2 ml LV e' lateral:   5.22 cm/s LV SV MOD A2C:     32.4 ml LV E/e' lateral: 12.8 LV SV MOD A4C:     32.9 ml LV SV MOD BP:      22.9 ml RIGHT VENTRICLE RV Basal diam:  4.70 cm RV Mid diam:    3.20 cm RV S prime:     10.17 cm/s TAPSE (M-mode): 1.9 cm LEFT ATRIUM             Index       RIGHT ATRIUM           Index LA Vol (A2C):   34.8 ml 17.80 ml/m RA Area:     11.40 cm LA Vol (A4C):   60.7 ml 31.04 ml/m RA Volume:   24.80 ml  12.68 ml/m LA Biplane Vol: 46.5 ml 23.78 ml/m  AORTIC VALVE AV Vmax:           137.00 cm/s AV Vmean:          90.500 cm/s AV VTI:            0.188 m AV Peak Grad:      7.5 mmHg AV Mean Grad:      4.0 mmHg LVOT Vmax:         104.00 cm/s LVOT Vmean:        67.000 cm/s LVOT VTI:          0.148 m LVOT/AV VTI ratio: 0.79 MITRAL VALVE MV Area (PHT): 5.23 cm     SHUNTS MV Decel Time: 145 msec     Systemic VTI: 0.15 m MV E velocity: 67.00 cm/s MV A velocity: 110.00 cm/s MV E/A ratio:  0.61 Rudean Haskell MD Electronically signed by Rudean Haskell MD Signature Date/Time: 04/02/2021/4:51:32 PM    Final    VAS [place] UPPER EXTREMITY VENOUS DUPLEX  Result Date: 04/04/2021 UPPER VENOUS STUDY  Patient Name:  KELEN LAURA  Date of Exam:   04/04/2021 Medical Rec #: [id number]        Accession #:    [id number] Date of Birth: [birth year]        Patient  Gender: M Patient Age:   65Y Exam Location:  [facility] Procedure:      VAS [place] UPPER EXTREMITY VENOUS DUPLEX Referring Phys: [id number] KELLY A GRIFFITH --------------------------------------------------------------------------------  Indications: Edema, and Erythema Limitations: Line and bandages. Comparison Study: no prior Performing Technologist: Archie Patten RVS  Examination Guidelines: A complete evaluation includes B-mode imaging, spectral Doppler, color Doppler, and power Doppler as needed of all accessible portions of each vessel. Bilateral testing is considered an integral part of a complete examination. Limited examinations for reoccurring indications  may be performed as noted.  Right Findings: +----------+------------+---------+-----------+----------+--------------+ RIGHT     CompressiblePhasicitySpontaneousProperties   Summary     +----------+------------+---------+-----------+----------+--------------+ IJV                                                 Not visualized +----------+------------+---------+-----------+----------+--------------+ Subclavian    Full       Yes       Yes                             +----------+------------+---------+-----------+----------+--------------+ Axillary      Full       Yes       Yes                             +----------+------------+---------+-----------+----------+--------------+ Brachial      Full       Yes       Yes                             +----------+------------+---------+-----------+----------+--------------+ Radial        Full                                                 +----------+------------+---------+-----------+----------+--------------+ Ulnar         Full                                                 +----------+------------+---------+-----------+----------+--------------+ Cephalic      Full                                                  +----------+------------+---------+-----------+----------+--------------+ Basilic       Full                                                 +----------+------------+---------+-----------+----------+--------------+  Left Findings: +----------+------------+---------+-----------+----------+-------+ LEFT      CompressiblePhasicitySpontaneousPropertiesSummary +----------+------------+---------+-----------+----------+-------+ Subclavian    Full       Yes       Yes                      +----------+------------+---------+-----------+----------+-------+  Summary:  Right: No evidence of deep vein thrombosis in the upper extremity. No evidence of superficial vein thrombosis in the upper extremity. No evidence of thrombosis in the subclavian.  Left: No evidence of thrombosis in the subclavian.  *See table(s) above for measurements and observations.  Diagnosing physician: Harold Barban MD Electronically signed by Harold Barban MD on 04/04/2021 at 10:23:50 PM.    Final     Microbiology No results found for this or any previous visit (from the past 240 hour(s)).  Lab Basic Metabolic Panel:  Recent Labs  Lab 04/08/21 0457 04/09/21 0753 04/10/21 0135  NA 147* 141 143  K 3.3* 3.8 3.1*  CL 99 98 102  CO2 36* 37* 36*  GLUCOSE 229* 204* 190*  BUN 19 17 18   CREATININE <0.30* <0.30* <0.30*  CALCIUM 8.7* 8.6* 7.7*  MG 2.1 2.3  --   PHOS 2.6 2.2*  --    Liver Function Tests: No results for input(s): AST, ALT, ALKPHOS, BILITOT, PROT, ALBUMIN in the last 168 hours. No results for input(s): LIPASE, AMYLASE in the last 168 hours. No results for input(s): AMMONIA in the last 168 hours. CBC: Recent Labs  Lab 04/08/21 0457 04/09/21 0753 04/10/21 0706  WBC 14.4* 18.2* 18.6*  NEUTROABS  --  15.0*  --   HGB 12.6* 12.5* 11.8*  HCT 40.4 40.3 38.7*  MCV 83.1 83.4 84.7  PLT 202 183 199   Cardiac Enzymes: No results for input(s): CKTOTAL, CKMB, CKMBINDEX, TROPONINI in the last 168 hours. Sepsis  Labs: Recent Labs  Lab 04/08/21 0457 04/09/21 0753 04/10/21 0706  PROCALCITON  --   --  0.54  WBC 14.4* 18.2* 18.6*    Procedures/Operations     Wayne [name] 04/14/2021, 3:15 PM

## 2021-04-18 DEATH — deceased

## 2021-04-29 ENCOUNTER — Ambulatory Visit: Payer: Medicare HMO | Admitting: Podiatry
